# Patient Record
Sex: Female | Born: 1955
Health system: Southern US, Community
[De-identification: ages and names within clinical notes are randomized; demographics above are authoritative.]

## PROBLEM LIST (undated history)

## (undated) DIAGNOSIS — I10 Essential (primary) hypertension: Secondary | ICD-10-CM

## (undated) DIAGNOSIS — J45998 Other asthma: Secondary | ICD-10-CM

## (undated) DIAGNOSIS — K219 Gastro-esophageal reflux disease without esophagitis: Secondary | ICD-10-CM

## (undated) DIAGNOSIS — L659 Nonscarring hair loss, unspecified: Secondary | ICD-10-CM

## (undated) DIAGNOSIS — J449 Chronic obstructive pulmonary disease, unspecified: Secondary | ICD-10-CM

## (undated) DIAGNOSIS — Z923 Personal history of irradiation: Secondary | ICD-10-CM

## (undated) DIAGNOSIS — H9190 Unspecified hearing loss, unspecified ear: Secondary | ICD-10-CM

## (undated) DIAGNOSIS — E785 Hyperlipidemia, unspecified: Secondary | ICD-10-CM

## (undated) DIAGNOSIS — C50919 Malignant neoplasm of unspecified site of unspecified female breast: Secondary | ICD-10-CM

## (undated) DIAGNOSIS — Z86010 Personal history of colonic polyps: Secondary | ICD-10-CM

## (undated) DIAGNOSIS — I447 Left bundle-branch block, unspecified: Secondary | ICD-10-CM

## (undated) DIAGNOSIS — Z9889 Other specified postprocedural states: Secondary | ICD-10-CM

## (undated) HISTORY — DX: Other specified postprocedural states: Z98.890

## (undated) HISTORY — DX: Nonscarring hair loss, unspecified: L65.9

## (undated) HISTORY — PX: ABDOMINAL HYSTERECTOMY: SHX81

## (undated) HISTORY — DX: Hyperlipidemia, unspecified: E78.5

## (undated) HISTORY — DX: Personal history of colonic polyps: Z86.010

## (undated) HISTORY — DX: Gastro-esophageal reflux disease without esophagitis: K21.9

## (undated) HISTORY — DX: Left bundle-branch block, unspecified: I44.7

## (undated) HISTORY — PX: COLONOSCOPY: SHX174

## (undated) HISTORY — PX: WISDOM TOOTH EXTRACTION: SHX21

## (undated) HISTORY — PX: OTHER SURGICAL HISTORY: SHX169

## (undated) HISTORY — PX: AUGMENTATION MAMMAPLASTY: SUR837

## (undated) HISTORY — DX: Essential (primary) hypertension: I10

## (undated) HISTORY — PX: UPPER GASTROINTESTINAL ENDOSCOPY: SHX188

## (undated) HISTORY — DX: Unspecified hearing loss, unspecified ear: H91.90

## (undated) HISTORY — PX: ANTERIOR FUSION CERVICAL SPINE: SUR626

## (undated) HISTORY — PX: POLYPECTOMY: SHX149

## (undated) HISTORY — DX: Chronic obstructive pulmonary disease, unspecified: J44.9

## (undated) HISTORY — DX: Other asthma: J45.998

---

## 1993-02-25 DIAGNOSIS — Z860100 Personal history of colon polyps, unspecified: Secondary | ICD-10-CM

## 1993-02-25 HISTORY — DX: Personal history of colon polyps, unspecified: Z86.0100

## 1993-02-25 HISTORY — DX: Personal history of colonic polyps: Z86.010

## 1997-12-22 ENCOUNTER — Ambulatory Visit (HOSPITAL_COMMUNITY): Admission: RE | Admit: 1997-12-22 | Discharge: 1997-12-22 | Payer: Self-pay | Admitting: Specialist

## 2000-09-10 ENCOUNTER — Other Ambulatory Visit: Admission: RE | Admit: 2000-09-10 | Discharge: 2000-09-10 | Payer: Self-pay | Admitting: Obstetrics and Gynecology

## 2000-09-15 ENCOUNTER — Ambulatory Visit (HOSPITAL_COMMUNITY): Admission: RE | Admit: 2000-09-15 | Discharge: 2000-09-15 | Payer: Self-pay | Admitting: Obstetrics and Gynecology

## 2000-09-15 ENCOUNTER — Encounter: Payer: Self-pay | Admitting: Obstetrics and Gynecology

## 2000-11-18 ENCOUNTER — Encounter: Payer: Self-pay | Admitting: Obstetrics and Gynecology

## 2000-11-18 ENCOUNTER — Inpatient Hospital Stay (HOSPITAL_COMMUNITY): Admission: RE | Admit: 2000-11-18 | Discharge: 2000-11-20 | Payer: Self-pay | Admitting: Obstetrics and Gynecology

## 2000-11-18 ENCOUNTER — Encounter (INDEPENDENT_AMBULATORY_CARE_PROVIDER_SITE_OTHER): Payer: Self-pay | Admitting: *Deleted

## 2001-09-23 ENCOUNTER — Emergency Department (HOSPITAL_COMMUNITY): Admission: EM | Admit: 2001-09-23 | Discharge: 2001-09-23 | Payer: Self-pay | Admitting: Emergency Medicine

## 2001-09-23 ENCOUNTER — Encounter: Payer: Self-pay | Admitting: Emergency Medicine

## 2001-10-17 ENCOUNTER — Encounter: Payer: Self-pay | Admitting: Neurology

## 2001-10-17 ENCOUNTER — Ambulatory Visit (HOSPITAL_COMMUNITY): Admission: RE | Admit: 2001-10-17 | Discharge: 2001-10-17 | Payer: Self-pay | Admitting: Neurology

## 2002-09-15 ENCOUNTER — Encounter: Payer: Self-pay | Admitting: Obstetrics and Gynecology

## 2002-09-15 ENCOUNTER — Encounter: Admission: RE | Admit: 2002-09-15 | Discharge: 2002-09-15 | Payer: Self-pay | Admitting: Obstetrics and Gynecology

## 2003-12-13 ENCOUNTER — Ambulatory Visit (HOSPITAL_COMMUNITY): Admission: RE | Admit: 2003-12-13 | Discharge: 2003-12-13 | Payer: Self-pay | Admitting: Ophthalmology

## 2003-12-27 ENCOUNTER — Ambulatory Visit (HOSPITAL_COMMUNITY): Admission: RE | Admit: 2003-12-27 | Discharge: 2003-12-27 | Payer: Self-pay | Admitting: Neurology

## 2006-02-12 ENCOUNTER — Encounter: Admission: RE | Admit: 2006-02-12 | Discharge: 2006-02-12 | Payer: Self-pay | Admitting: Obstetrics and Gynecology

## 2006-05-27 ENCOUNTER — Ambulatory Visit: Payer: Self-pay | Admitting: Internal Medicine

## 2006-05-27 LAB — CONVERTED CEMR LAB
ALT: 33 units/L (ref 0–40)
AST: 30 units/L (ref 0–37)
Albumin: 3.5 g/dL (ref 3.5–5.2)
Alkaline Phosphatase: 121 units/L — ABNORMAL HIGH (ref 39–117)
BUN: 9 mg/dL (ref 6–23)
Basophils Relative: 0.5 % (ref 0.0–1.0)
CO2: 32 meq/L (ref 19–32)
Calcium: 9.4 mg/dL (ref 8.4–10.5)
Cholesterol: 168 mg/dL (ref 0–200)
Creatinine, Ser: 0.7 mg/dL (ref 0.4–1.2)
Eosinophils Relative: 2 % (ref 0.0–5.0)
GFR calc Af Amer: 114 mL/min
GFR calc non Af Amer: 95 mL/min
Glucose, Bld: 94 mg/dL (ref 70–99)
HCT: 43.8 % (ref 36.0–46.0)
HDL: 38.6 mg/dL — ABNORMAL LOW (ref >39.0)
Hemoglobin, Urine: NEGATIVE
Hemoglobin: 15 g/dL (ref 12.0–15.0)
Leukocytes, UA: NEGATIVE
MCHC: 34.2 g/dL (ref 30.0–36.0)
MCV: 88.7 fL (ref 78.0–100.0)
Neutro Abs: 4.5 10*3/uL (ref 1.4–7.7)
Neutrophils Relative %: 62.3 % (ref 43.0–77.0)
Potassium: 4.3 meq/L (ref 3.5–5.1)
RBC: 4.94 M/uL (ref 3.87–5.11)
RDW: 12.3 % (ref 11.5–14.6)
TSH: 1 microintl units/mL (ref 0.35–5.50)
Total Bilirubin: 1.1 mg/dL (ref 0.3–1.2)
Urobilinogen, UA: 0.2 (ref 0.0–1.0)
WBC: 7.2 10*3/uL (ref 4.5–10.5)

## 2007-03-12 ENCOUNTER — Encounter: Admission: RE | Admit: 2007-03-12 | Discharge: 2007-03-12 | Payer: Self-pay | Admitting: Obstetrics and Gynecology

## 2007-09-29 ENCOUNTER — Ambulatory Visit: Payer: Self-pay | Admitting: Gastroenterology

## 2007-10-13 ENCOUNTER — Ambulatory Visit: Payer: Self-pay | Admitting: Gastroenterology

## 2007-10-13 ENCOUNTER — Encounter: Payer: Self-pay | Admitting: Gastroenterology

## 2007-10-13 LAB — HM COLONOSCOPY

## 2007-10-14 ENCOUNTER — Encounter: Payer: Self-pay | Admitting: Gastroenterology

## 2007-11-04 ENCOUNTER — Ambulatory Visit: Payer: Self-pay | Admitting: Internal Medicine

## 2007-11-04 DIAGNOSIS — Z860100 Personal history of colon polyps, unspecified: Secondary | ICD-10-CM | POA: Insufficient documentation

## 2007-11-04 DIAGNOSIS — R21 Rash and other nonspecific skin eruption: Secondary | ICD-10-CM | POA: Insufficient documentation

## 2007-11-04 DIAGNOSIS — K219 Gastro-esophageal reflux disease without esophagitis: Secondary | ICD-10-CM

## 2007-11-04 DIAGNOSIS — L659 Nonscarring hair loss, unspecified: Secondary | ICD-10-CM

## 2007-11-04 DIAGNOSIS — E785 Hyperlipidemia, unspecified: Secondary | ICD-10-CM | POA: Insufficient documentation

## 2007-11-04 HISTORY — DX: Nonscarring hair loss, unspecified: L65.9

## 2007-11-04 HISTORY — DX: Personal history of colon polyps, unspecified: Z86.0100

## 2007-11-04 HISTORY — DX: Hyperlipidemia, unspecified: E78.5

## 2007-11-04 HISTORY — DX: Gastro-esophageal reflux disease without esophagitis: K21.9

## 2007-11-04 HISTORY — DX: Personal history of colonic polyps: Z86.010

## 2007-11-05 LAB — CONVERTED CEMR LAB
ALT: 21 units/L (ref 0–35)
Alkaline Phosphatase: 98 units/L (ref 39–117)
BUN: 9 mg/dL (ref 6–23)
Basophils Absolute: 0.1 10*3/uL (ref 0.0–0.1)
Basophils Relative: 1 % (ref 0.0–3.0)
Calcium: 9.7 mg/dL (ref 8.4–10.5)
Chloride: 106 meq/L (ref 96–112)
Cholesterol: 147 mg/dL (ref 0–200)
Creatinine, Ser: 0.8 mg/dL (ref 0.4–1.2)
Eosinophils Absolute: 0.2 10*3/uL (ref 0.0–0.7)
Eosinophils Relative: 2.1 % (ref 0.0–5.0)
GFR calc non Af Amer: 81 mL/min
HCT: 44.3 % (ref 36.0–46.0)
Hemoglobin: 15.3 g/dL — ABNORMAL HIGH (ref 12.0–15.0)
Ketones, ur: NEGATIVE mg/dL
LDL Cholesterol: 94 mg/dL (ref 0–99)
MCHC: 34.5 g/dL (ref 30.0–36.0)
MCV: 90.2 fL (ref 78.0–100.0)
Monocytes Absolute: 0.8 10*3/uL (ref 0.1–1.0)
Neutro Abs: 5.3 10*3/uL (ref 1.4–7.7)
Neutrophils Relative %: 62.2 % (ref 43.0–77.0)
RBC: 4.92 M/uL (ref 3.87–5.11)
Specific Gravity, Urine: 1.01 (ref 1.000–1.03)
Total Bilirubin: 1.3 mg/dL — ABNORMAL HIGH (ref 0.3–1.2)
Total Protein, Urine: NEGATIVE mg/dL
Total Protein: 7.1 g/dL (ref 6.0–8.3)
Triglycerides: 94 mg/dL (ref 0–149)
Urine Glucose: NEGATIVE mg/dL
Urobilinogen, UA: 0.2 (ref 0.0–1.0)
WBC: 8.6 10*3/uL (ref 4.5–10.5)
pH: 6 (ref 5.0–8.0)

## 2007-11-27 ENCOUNTER — Ambulatory Visit: Payer: Self-pay | Admitting: Internal Medicine

## 2007-11-27 DIAGNOSIS — L738 Other specified follicular disorders: Secondary | ICD-10-CM | POA: Insufficient documentation

## 2009-02-02 ENCOUNTER — Encounter: Admission: RE | Admit: 2009-02-02 | Discharge: 2009-02-02 | Payer: Self-pay | Admitting: Obstetrics and Gynecology

## 2009-05-09 ENCOUNTER — Ambulatory Visit: Payer: Self-pay | Admitting: Internal Medicine

## 2009-05-09 DIAGNOSIS — J069 Acute upper respiratory infection, unspecified: Secondary | ICD-10-CM | POA: Insufficient documentation

## 2009-05-09 DIAGNOSIS — R079 Chest pain, unspecified: Secondary | ICD-10-CM | POA: Insufficient documentation

## 2009-06-06 ENCOUNTER — Ambulatory Visit: Payer: Self-pay | Admitting: Internal Medicine

## 2009-06-06 LAB — CONVERTED CEMR LAB
AST: 31 units/L (ref 0–37)
Albumin: 3.8 g/dL (ref 3.5–5.2)
BUN: 9 mg/dL (ref 6–23)
Basophils Absolute: 0 10*3/uL (ref 0.0–0.1)
Basophils Relative: 0.4 % (ref 0.0–3.0)
CO2: 32 meq/L (ref 19–32)
Calcium: 9.5 mg/dL (ref 8.4–10.5)
Eosinophils Absolute: 0.1 10*3/uL (ref 0.0–0.7)
Eosinophils Relative: 1.7 % (ref 0.0–5.0)
GFR calc non Af Amer: 79.98 mL/min (ref >60)
Glucose, Bld: 95 mg/dL (ref 70–99)
HCT: 41.9 % (ref 36.0–46.0)
HDL: 40.9 mg/dL (ref >39.00)
Ketones, ur: NEGATIVE mg/dL
Leukocytes, UA: NEGATIVE
Lymphs Abs: 2.1 10*3/uL (ref 0.7–4.0)
MCV: 89.1 fL (ref 78.0–100.0)
Monocytes Absolute: 0.6 10*3/uL (ref 0.1–1.0)
Monocytes Relative: 8.9 % (ref 3.0–12.0)
Neutrophils Relative %: 58 % (ref 43.0–77.0)
Nitrite: NEGATIVE
Platelets: 227 10*3/uL (ref 150.0–400.0)
RBC: 4.7 M/uL (ref 3.87–5.11)
Specific Gravity, Urine: >=1.03 (ref 1.000–1.030)
TSH: 1.03 microintl units/mL (ref 0.35–5.50)
Total CHOL/HDL Ratio: 4
Urobilinogen, UA: 2 (ref 0.0–1.0)
VLDL: 20.4 mg/dL (ref 0.0–40.0)
WBC: 6.8 10*3/uL (ref 4.5–10.5)

## 2009-06-07 ENCOUNTER — Ambulatory Visit: Payer: Self-pay | Admitting: Internal Medicine

## 2009-06-07 DIAGNOSIS — I10 Essential (primary) hypertension: Secondary | ICD-10-CM | POA: Insufficient documentation

## 2009-06-07 HISTORY — DX: Essential (primary) hypertension: I10

## 2009-06-26 ENCOUNTER — Telehealth: Payer: Self-pay | Admitting: Internal Medicine

## 2009-06-26 DIAGNOSIS — H9209 Otalgia, unspecified ear: Secondary | ICD-10-CM | POA: Insufficient documentation

## 2009-07-11 ENCOUNTER — Encounter: Admission: RE | Admit: 2009-07-11 | Discharge: 2009-07-11 | Payer: Self-pay | Admitting: Otolaryngology

## 2009-12-08 ENCOUNTER — Ambulatory Visit: Payer: Self-pay | Admitting: Internal Medicine

## 2010-02-05 ENCOUNTER — Encounter
Admission: RE | Admit: 2010-02-05 | Discharge: 2010-02-05 | Payer: Self-pay | Source: Home / Self Care | Attending: Obstetrics and Gynecology | Admitting: Obstetrics and Gynecology

## 2010-03-27 NOTE — Assessment & Plan Note (Signed)
Summary: 1 month f/u // #? cd   Vital Signs:  Patient profile:   55 year old female Height:      65 inches Weight:      196 pounds BMI:     32.73 O2 Sat:      95 % on Room air Temp:     97.5 degrees F oral Pulse rate:   79 / minute BP sitting:   148 / 100  (left arm) Cuff size:   regular  Vitals Entered ByZella Ball Ewing (June 07, 2009 8:09 AM)  O2 Flow:  Room air  Preventive Care Screening  Colonoscopy:    Next Due:  10/2012  Pap Smear:    Date:  02/14/2009    Results:  normal   Mammogram:    Date:  12/26/2008    Results:  normal   CC: 1 month followup/Re   CC:  1 month followup/Re.  History of Present Illness: here for f/u;  she notes BP has been persistently elevated despite near resolution of URI symptoms;  unfortunately also with mild left ear fullness without fever, decreased hearing, vertigo, ST, cough;  also has improved size and tenderness o lump to mid left neck. but she is still concerned.  No other LA or tender lumps, wt loss, night sweats or other consitutional symtpoms.  Pt denies CP, sob, doe, wheezing, orthopnea, pnd, worsening LE edema, palps, dizziness or syncope   Pt denies new neuro symptoms such as headache, facial or extremity weakness     Preventive Screening-Counseling & Management      Drug Use:  no.    Problems Prior to Update: 1)  Hypertension  (ICD-401.9) 2)  Elevated Blood Pressure Without Diagnosis of Hypertension  (ICD-796.2) 3)  Chest Pain  (ICD-786.50) 4)  Uri  (ICD-465.9) 5)  Folliculitis  (ICD-704.8) 6)  Hyperlipidemia  (ICD-272.4) 7)  Gerd  (ICD-530.81) 8)  Colonic Polyps, Hx of  (ICD-V12.72) 9)  Rash-nonvesicular  (ICD-782.1) 10)  Hair Loss  (ICD-704.00) 11)  Preventive Health Care  (ICD-V70.0)  Medications Prior to Update: 1)  Cephalexin 500 Mg Caps (Cephalexin) .Marland Kitchen.. 1po Three Times A Day  Current Medications (verified): 1)  Losartan Potassium 50 Mg Tabs (Losartan Potassium) .Marland Kitchen.. 1po Once Daily 2)  Aspir-Low 81 Mg Tbec  (Aspirin) .Marland Kitchen.. 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 grandmother with breast cancer father with lung cancer  Social History: Reviewed history from 11/04/2007 and no changes required. 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         all otherwise negative per pt -    Physical Exam  General:  alert and  overweight-appearing.   Head:  normocephalic and atraumatic.   Eyes:  vision grossly intact, pupils equal, and pupils round.   Ears:  R ear normal.  , left TM with mild erythema, no bulging or canal d/c  Nose:  no external deformity and no nasal discharge.   Mouth:  no gingival abnormalities and pharynx pink and moist.   Neck:  supple.  , has one approx .5 cm or less mild tender LN just anterior to the mid SCM Lungs:  normal respiratory effort and normal breath sounds.   Heart:  normal  rate and regular rhythm.   Abdomen:  soft, non-tender, and normal bowel sounds.   Msk:  no joint tenderness and no joint swelling.   Extremities:  no edema, no erythema  Neurologic:  cranial nerves II-XII intact and strength normal in all extremities.   Skin:  color normal and no rashes.   Psych:  slightly anxious.     Impression & Recommendations:  Problem # 1:  Preventive Health Care (ICD-V70.0) Overall doing well, age appropriate education and counseling updated and referral for appropriate preventive services done unless declined, immunizations up to date or declined, diet counseling done if overweight, urged to quit smoking if smokes , most recent labs reviewed and current ordered if appropriate, ecg reviewed or declined (interpretation per ECG scanned in the EMR if done); information regarding Medicare Prevention requirements given if appropriate   Problem # 2:  HYPERTENSION (ICD-401.9)  new onset - for losartan 50 mg per day, f/u BP at home, as well as next visit  Her updated medication list for this problem includes:    Losartan Potassium 50 Mg Tabs (Losartan potassium) .Marland Kitchen... 1po once daily  BP today: 148/100 Prior BP: 148/102 (05/09/2009)  Labs Reviewed: K+: 3.9 (06/06/2009) Creat: : 0.8 (06/06/2009)   Chol: 172 (06/06/2009)   HDL: 40.90 (06/06/2009)   LDL: 111 (06/06/2009)   TG: 102.0 (06/06/2009)  Problem # 3:  URI (ICD-465.9)  Her updated medication list for this problem includes:    Aspir-low 81 Mg Tbec (Aspirin) .Marland Kitchen... 1po once daily overall improved, with mild left TM erythema and tender left neck  LA improved as well;  ok to follow further, to ENT if does not improve within 1 -2 wks  Complete Medication List: 1)  Losartan Potassium 50 Mg Tabs (Losartan potassium) .Marland Kitchen.. 1po once daily 2)  Aspir-low 81 Mg Tbec (Aspirin) .Marland Kitchen.. 1po once daily  Other Orders: Tdap => 38yrs IM (54098) Admin 1st Vaccine (11914)  Patient Instructions: 1)  you had the tetanus shot  today 2)  Please take all new medications as prescribed - the losartan 3)  Please check your Blood Pressure at home on a regular basis - call in 3 to 4 wks if it seems the top number is still > 140, or the lower number is still > 90 on average 4)  Take an Aspirin every day - 81 mg -1  per day - COATED only (the brand makes not difference) to reduce risk of stroke and heart disease 5)  please follow lower cholesterol diet 6)  You can also use Mucinex OTC or it's generic for congestion in the left inner ear 7)  Please call in 2 wks for ENT referral if the left ear and neck lump are not improved 8)  Please schedule a follow-up appointment in 6 months, or sooner if needed Prescriptions: LOSARTAN POTASSIUM 50 MG TABS (LOSARTAN POTASSIUM) 1po once daily  #30 x 11   Entered  and Authorized by:   Corwin Levins MD   Signed by:   Corwin Levins MD on 06/07/2009   Method used:   Print then Give to Patient   RxID:   [id number]    Immunizations Administered:  Tetanus Vaccine:    Vaccine Type: Tdap    Site: right deltoid    Mfr: GlaxoSmithKline    Dose: 0.5 ml    Route: IM    Given by: Robin Ewing    Exp. Date: 05/20/2011    Lot #: [id number]    VIS given: 01/13/07 version given June 07, 2009.

## 2010-03-27 NOTE — Assessment & Plan Note (Signed)
Summary: knot in throat/headache/chest hurts/lnauseated/lb   Vital Signs:  Patient profile:   55 year old female Height:      65 inches Weight:      198 pounds BMI:     33.07 O2 Sat:      97 % on Room air Temp:     97.5 degrees F oral Pulse rate:   82 / minute BP sitting:   148 / 102  (left arm) Cuff size:   regular  Vitals Entered ByZella Ball Ewing (May 09, 2009 11:27 AM)  O2 Flow:  Room air CC: chest pain, nauseated, know in throat/RE   CC:  chest pain, nauseated, and know in throat/RE.  History of Present Illness: here with acute onset midl to mod x 3 days URI symptoms with headache, sinus congesiton and severe ST, with fever, malaase and fatigue;  also with upper mid ant dull CP nonexertional but nonpleuritic , nonradiating and not assoc with diaphoresis, vomit, palp or fatigue although there is significant nausea and decreased appeitite.  Pt denies other CP, sob, doe, wheezing, orthopnea, pnd, worsening LE edema, palps, dizziness or syncope Pt denies new neuro symptoms such as headache, facial or extremity weakness No prior hx of elev BP per pt.   Problems Prior to Update: 1)  Chest Pain  (ICD-786.50) 2)  Uri  (ICD-465.9) 3)  Folliculitis  (ICD-704.8) 4)  Hyperlipidemia  (ICD-272.4) 5)  Gerd  (ICD-530.81) 6)  Colonic Polyps, Hx of  (ICD-V12.72) 7)  Rash-nonvesicular  (ICD-782.1) 8)  Hair Loss  (ICD-704.00) 9)  Preventive Health Care  (ICD-V70.0)  Medications Prior to Update: 1)  Doxycycline Hyclate 100 Mg Tabs (Doxycycline Hyclate) .Marland Kitchen.. 1 By Mouth Two Times A Day  Current Medications (verified): 1)  Cephalexin 500 Mg Caps (Cephalexin) .Marland Kitchen.. 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 , mild ill   Head:  normocephalic and atraumatic.   Eyes:  vision grossly intact, pupils equal, and pupils round.   Ears:  bilat tm's red, sinus tender blat Nose:  nasal dischargemucosal pallor and mucosal edema.   Mouth:  pharyngeal erythema and fair dentition.   Neck:  supple and cervical lymphadenopathy.   Lungs:  normal respiratory effort and normal breath sounds.   Heart:  normal rate and regular rhythm.   Abdomen:  soft, non-tender, and normal bowel sounds.   Extremities:  no edema, no erythema    Impression & Recommendations:  Problem # 1:  URI (ICD-465.9) treat as above, f/u any worsening signs or symptoms - cephalexin course  Problem # 2:  CHEST PAIN (ICD-786.50) ? MSK from cough, cant r/o other such as pna  - to check cxr  Orders: T-2 View CXR, Same Day (71020.5TC)  Problem # 3:  ELEVATED BLOOD PRESSURE WITHOUT DIAGNOSIS OF HYPERTENSION (ICD-796.2)  mild elev today, likely situational, ok to follow, continue same treatment   BP today: 148/102 Prior BP: 122/86 (11/27/2007)  Labs Reviewed: Creat: 0.8 (11/04/2007) Chol: 147 (11/04/2007)   HDL: 34.3 (11/04/2007)  LDL: 94 (11/04/2007)   TG: 94 (11/04/2007)  Instructed in low sodium diet (DASH Handout) and behavior modification.    Complete Medication List: 1)  Cephalexin 500 Mg Caps (Cephalexin) .Marland Kitchen.. 1po three times a day  Patient Instructions: 1)  Please take all new medications as prescribed 2)  Continue all previous medications as before this visit  3)  Please go to Radiology in the basement level for your X-Ray today  4)  Please schedule a follow-up appointment in 1 month with CPX labs Prescriptions: CEPHALEXIN 500 MG CAPS (CEPHALEXIN) 1po three  times a day  #30 x 0   Entered and Authorized by:   Corwin Levins MD   Signed by:   Corwin Levins MD on 05/09/2009   Method used:   Print then Give to Patient   RxID:   [id number]

## 2010-03-27 NOTE — Progress Notes (Signed)
Summary: Referral  Phone Note Call from Patient Call back at Work Phone [phone]   Caller: Patient Summary of Call: pt called stating that ear pain and "lump in throat" are not better. pt is requesting referral to ENT Dr Laveda Abbe on [facility] Initial call taken by: Margaret Pyle, CMA,  Jun 26, 2009 10:30 AM  Follow-up for Phone Call        ok  for referral - done per emr Follow-up by: Corwin Levins MD,  Jun 26, 2009 1:04 PM  Additional Follow-up for Phone Call Additional follow up Details #1::        pt informed via secure VM Additional Follow-up by: Margaret Pyle, CMA,  Jun 26, 2009 1:12 PM  New Problems: EAR PAIN (ICD-388.70)   New Problems: EAR PAIN (ICD-388.70)

## 2010-03-27 NOTE — Assessment & Plan Note (Signed)
Summary: 6 MO ROV /NWS #   Vital Signs:  Patient profile:   55 year old female Height:      65 inches Weight:      189.50 pounds BMI:     31.65 O2 Sat:      96 % on Room air Temp:     97.6 degrees F oral Pulse rate:   84 / minute BP sitting:   140 / 84  (left arm) Cuff size:   regular  Vitals Entered By: Zella Ball Ewing CMA (AAMA) (December 08, 2009 8:25 AM)  O2 Flow:  Room air CC: 6 month ROV/RE   CC:  6 month ROV/RE.  History of Present Illness: here to f/u; overall doing well;  has not been taking the losartan and to monitor as well as follow lifestyle changes with low salt, excericse, wt control;  Pt denies CP, worsening sob, doe, wheezing, orthopnea, pnd, worsening LE edema, palps, dizziness or syncope  Pt denies new neuro symptoms such as headache, facial or extremity weakness  No fever, wt loss, night sweats, loss of appetite or other constitutional symptoms  Pt denies polydipsia, polyuria  trying to follow low chol diet, wt stable, little excercise however .    Problems Prior to Update: 1)  Ear Pain  (ICD-388.70) 2)  Hypertension  (ICD-401.9) 3)  Chest Pain  (ICD-786.50) 4)  Uri  (ICD-465.9) 5)  Folliculitis  (ICD-704.8) 6)  Hyperlipidemia  (ICD-272.4) 7)  Gerd  (ICD-530.81) 8)  Colonic Polyps, Hx of  (ICD-V12.72) 9)  Rash-nonvesicular  (ICD-782.1) 10)  Hair Loss  (ICD-704.00) 11)  Preventive Health Care  (ICD-V70.0)  Medications Prior to Update: 1)  Losartan Potassium 50 Mg Tabs (Losartan Potassium) .Marland Kitchen.. 1po Once Daily 2)  Aspir-Low 81 Mg Tbec (Aspirin) .Marland Kitchen.. 1po Once Daily  Current Medications (verified): 1)  Aspir-Low 81 Mg Tbec (Aspirin) .Marland Kitchen.. 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   Head:  normocephalic and atraumatic.   Eyes:  vision grossly intact, pupils equal, and pupils round.   Ears:  R ear normal and L ear normal.   Nose:  no external deformity and no nasal discharge.   Mouth:  no gingival abnormalities and pharynx pink and moist.   Neck:  supple and no masses.   Lungs:  normal respiratory effort and normal breath sounds.   Heart:  normal rate and regular rhythm.   Extremities:  no edema, no erythema    Impression & Recommendations:  Problem # 1:  HYPERTENSION (ICD-401.9)  The following medications were removed from the medication list:    Losartan Potassium 50 Mg Tabs (Losartan potassium) .Marland Kitchen... 1po once daily  BP today: 140/84 Prior BP: 148/100 (06/07/2009)  Labs Reviewed: K+: 3.9 (06/06/2009) Creat: : 0.8 (06/06/2009)   Chol: 172 (06/06/2009)   HDL: 40.90 (06/06/2009)   LDL: 111 (06/06/2009)   TG: 102.0 (06/06/2009) stable overall by hx and exam, ok to continue meds/tx as is   Problem # 2:  HYPERLIPIDEMIA (ICD-272.4)  Labs Reviewed: SGOT: 31 (06/06/2009)   SGPT: 27 (06/06/2009)   HDL:40.90 (06/06/2009), 34.3 (11/04/2007)  LDL:111 (06/06/2009), 94 (42/59/5638)  Chol:172 (06/06/2009), 147 (11/04/2007)  Trig:102.0 (06/06/2009), 94 (11/04/2007) d/w pt - declines statin, to follow lower chol diet, Pt to continue diet efforts,  - goal LDL less than 100   Complete Medication List: 1)  Aspir-low 81 Mg Tbec (Aspirin) .Marland Kitchen.. 1po once daily  Other Orders: Admin 1st Vaccine (75643) Flu Vaccine 53yrs + [phone])  Patient Instructions: 1)  Ok to stay off the losartan for now 2)  Continue all previous medications as before this visit  - the Aspirin 3)  Please continue to  follow the lower chol diet 4)  Please monitor your BP at home at least weekly and call for persistent BP > 140 (the top number) or 90 on the lower number) 5)  Please bring your home machine with you to your next visit  6)  You had the flu shot today 7)  Please schedule a follow-up appointment in 6 months with CPX labs  Flu Vaccine Consent Questions     Do you have a history of severe allergic reactions to this vaccine? no    Any prior history of allergic reactions to egg and/or gelatin? no    Do you have a sensitivity to the preservative Thimersol? no    Do you have a past history of Guillan-Barre Syndrome? no    Do you currently have an acute febrile illness? no    Have you ever had a severe reaction to latex? no    Vaccine information given and explained to patient? yes    Are you currently pregnant? no    Lot Number:[id number]   Exp Date:08/25/2010   Site Given  Left Deltoid [email or web address]

## 2010-05-18 ENCOUNTER — Encounter: Payer: Self-pay | Admitting: Internal Medicine

## 2010-05-18 ENCOUNTER — Ambulatory Visit (INDEPENDENT_AMBULATORY_CARE_PROVIDER_SITE_OTHER): Payer: BC Managed Care – PPO | Admitting: Internal Medicine

## 2010-05-18 VITALS — BP 130/90 | HR 81 | Temp 98.0°F | Ht 65.0 in | Wt 185.4 lb

## 2010-05-18 DIAGNOSIS — I10 Essential (primary) hypertension: Secondary | ICD-10-CM

## 2010-05-18 DIAGNOSIS — Z0000 Encounter for general adult medical examination without abnormal findings: Secondary | ICD-10-CM

## 2010-05-18 DIAGNOSIS — Z0001 Encounter for general adult medical examination with abnormal findings: Secondary | ICD-10-CM | POA: Insufficient documentation

## 2010-05-18 DIAGNOSIS — Z7189 Other specified counseling: Secondary | ICD-10-CM | POA: Insufficient documentation

## 2010-05-18 MED ORDER — LOSARTAN POTASSIUM 50 MG PO TABS: 50.0000 mg | ORAL_TABLET | Freq: Every day | ORAL | Status: DC

## 2010-05-18 NOTE — Assessment & Plan Note (Addendum)
Uncontrolled - to re-start the losartan 50mg ,  F/u 4 wks with labs/cpx - o/w stable overall by hx and exam  Lab Results  Component Value Date   WBC 6.8 06/06/2009   HGB 14.6 06/06/2009   HGB TRACE-INTACT 06/06/2009   HCT 41.9 06/06/2009   PLT 227.0 06/06/2009   CHOL 172 06/06/2009   TRIG 102.0 06/06/2009   HDL 40.90 06/06/2009   ALT 27 06/06/2009   AST 31 06/06/2009   NA 149* 06/06/2009   K 3.9 06/06/2009   CL 109 06/06/2009   CREATININE 0.8 06/06/2009   BUN 9 06/06/2009   CO2 32 06/06/2009   TSH 1.03 06/06/2009

## 2010-05-18 NOTE — Patient Instructions (Signed)
Take all new medications as prescribed Continue all other medications as before  Please followup in 1 mo with blood work prior (about 3-5 days)

## 2010-05-18 NOTE — Progress Notes (Signed)
Here to /u, overall doing ok, but mult recent BP at home and [facility] with  BP at home and [employer] has been about 145 /low 95's;  Pt denies chest pain, increased sob or doe, wheezing, orthopnea, PND, increased LE swelling, palpitations, dizziness or syncope.    Pt denies new neurological symptoms such as new headache, or facial or extremity weakness or numbness.   Pt denies polydipsia, polyuria    Pt states overall good compliance with meds, trying to follow lower cholesterol, diabetic diet, wt overall stable but little exercise however.     Pt denies fever, wt loss, night sweats, loss of appetite, or other constitutional symptoms  Overall good compliance with treatment, and good medicine tolerability.   Past Medical History  Diagnosis Date  . Hx of colonic polyps 1995    adenomatous  . GERD (gastroesophageal reflux disease)   . Hyperlipidemia   . Hypertension   . COLONIC POLYPS, HX OF 11/04/2007  . GERD 11/04/2007  . HYPERLIPIDEMIA 11/04/2007  . HYPERTENSION 06/07/2009   Past Surgical History  Procedure Date  . Bilat ear surgury/hearing loss   . Abdominal hysterectomy     reports that she has quit smoking. She does not have any smokeless tobacco history on file. She reports that she does not drink alcohol or use illicit drugs. family history includes Breast cancer in her maternal grandmother and other; Cancer in her father; and Lung cancer in her father. Allergies  Allergen Reactions  . Codeine     REACTION: nausea,   Review of Systems  Constitutional: Negative for diaphoresis and unexpected weight change.  HENT: Negative for drooling and tinnitus.   Eyes: Negative for photophobia and visual disturbance.  Respiratory: Negative for choking and stridor.   Gastrointestinal: Negative for vomiting and blood in stool.  Genitourinary: Negative for hematuria and decreased urine volume.  Musculoskeletal: Negative for gait problem.  Skin: Negative for color change and wound.  Neurological: Negative  for tremors and numbness.  Psychiatric/Behavioral: Negative for decreased concentration. The patient is not hyperactive.    Physical Exam  Constitutional: Pt appears well-developed and well-nourished.  HENT: Head: Normocephalic.  Right Ear: External ear normal.  Left Ear: External ear normal.  Eyes: Conjunctivae and EOM are normal. Pupils are equal, round, and reactive to light.  Neck: Normal range of motion. Neck supple.  Cardiovascular: Normal rate and regular rhythm.   Pulmonary/Chest: Effort normal and breath sounds normal.  Abd:  Soft, NT, non-distended, + BS Neurological: Pt is alert. No cranial nerve deficit.  Skin: Skin is warm. No erythema.  Psychiatric: Pt behavior is normal. Thought content normal.

## 2010-06-04 ENCOUNTER — Other Ambulatory Visit: Payer: Self-pay | Admitting: Internal Medicine

## 2010-06-04 DIAGNOSIS — Z0000 Encounter for general adult medical examination without abnormal findings: Secondary | ICD-10-CM

## 2010-06-05 ENCOUNTER — Other Ambulatory Visit: Payer: Self-pay

## 2010-06-08 ENCOUNTER — Encounter: Payer: Self-pay | Admitting: Internal Medicine

## 2010-06-11 ENCOUNTER — Other Ambulatory Visit (INDEPENDENT_AMBULATORY_CARE_PROVIDER_SITE_OTHER): Payer: BC Managed Care – PPO

## 2010-06-11 DIAGNOSIS — Z0000 Encounter for general adult medical examination without abnormal findings: Secondary | ICD-10-CM

## 2010-06-11 LAB — HEPATIC FUNCTION PANEL
ALT: 18 U/L (ref 0–35)
AST: 27 U/L (ref 0–37)
Bilirubin, Direct: 0.2 mg/dL (ref 0.0–0.3)
Total Bilirubin: 1.5 mg/dL — ABNORMAL HIGH (ref 0.3–1.2)

## 2010-06-11 LAB — BASIC METABOLIC PANEL
BUN: 13 mg/dL (ref 6–23)
CO2: 28 mEq/L (ref 19–32)
Chloride: 106 mEq/L (ref 96–112)
Creatinine, Ser: 0.8 mg/dL (ref 0.4–1.2)

## 2010-06-11 LAB — CBC WITH DIFFERENTIAL/PLATELET
Basophils Relative: 0.4 % (ref 0.0–3.0)
Eosinophils Relative: 2.1 % (ref 0.0–5.0)
HCT: 40.7 % (ref 36.0–46.0)
Hemoglobin: 14 g/dL (ref 12.0–15.0)
MCHC: 34.4 g/dL (ref 30.0–36.0)
MCV: 90.8 fl (ref 78.0–100.0)
Neutro Abs: 4.6 10*3/uL (ref 1.4–7.7)
RBC: 4.49 Mil/uL (ref 3.87–5.11)
RDW: 13.2 % (ref 11.5–14.6)

## 2010-06-11 LAB — LIPID PANEL: Total CHOL/HDL Ratio: 4

## 2010-06-11 LAB — TSH: TSH: 0.88 u[IU]/mL (ref 0.35–5.50)

## 2010-06-11 LAB — URINALYSIS, ROUTINE W REFLEX MICROSCOPIC
Bilirubin Urine: NEGATIVE
Hgb urine dipstick: NEGATIVE
Ketones, ur: NEGATIVE
Nitrite: POSITIVE
Total Protein, Urine: NEGATIVE

## 2010-06-12 ENCOUNTER — Ambulatory Visit (INDEPENDENT_AMBULATORY_CARE_PROVIDER_SITE_OTHER): Payer: BC Managed Care – PPO | Admitting: Internal Medicine

## 2010-06-12 ENCOUNTER — Encounter: Payer: Self-pay | Admitting: Internal Medicine

## 2010-06-12 VITALS — BP 112/70 | HR 77 | Temp 98.1°F | Ht 65.0 in | Wt 187.5 lb

## 2010-06-12 DIAGNOSIS — Z0000 Encounter for general adult medical examination without abnormal findings: Secondary | ICD-10-CM

## 2010-06-12 DIAGNOSIS — I10 Essential (primary) hypertension: Secondary | ICD-10-CM

## 2010-06-12 NOTE — Assessment & Plan Note (Signed)
stable overall by hx and exam, most recent lab reviewed with pt, and pt to continue medical treatment as before  BP Readings from Last 3 Encounters:  06/12/10 112/70  05/18/10 130/90  12/08/09 140/84

## 2010-06-12 NOTE — Patient Instructions (Signed)
Continue all other medications as before Please return in 1 year for your yearly visit, or sooner if needed, with Lab testing done 3-5 days before  

## 2010-06-12 NOTE — Progress Notes (Signed)
Subjective:    Patient ID: Caitlin Wall, female    DOB: 13-Sep-1955, 55 y.o.   MRN: [id number]  HPI Here for wellness and f/u;  Overall doing ok;  Pt denies CP, worsening SOB, DOE, wheezing, orthopnea, PND, worsening LE edema, palpitations, dizziness or syncope.  Pt denies neurological change such as new Headache, facial or extremity weakness.  Pt denies polydipsia, polyuria, or low sugar symptoms. Pt states overall good compliance with treatment and medications, good tolerability, and trying to follow lower cholesterol diet.  Pt denies worsening depressive symptoms, suicidal ideation or panic. No fever, wt loss, night sweats, loss of appetite, or other constitutional symptoms.  Pt states good ability with ADL's, low fall risk, home safety reviewed and adequate, no significant changes in hearing or vision, and occasionally active with exercise. BP at [employer] usually similar to today.   Past Medical History  Diagnosis Date  . Hx of colonic polyps 1995    adenomatous  . GERD (gastroesophageal reflux disease)   . Hyperlipidemia   . Hypertension   . COLONIC POLYPS, HX OF 11/04/2007  . GERD 11/04/2007  . HYPERLIPIDEMIA 11/04/2007  . HYPERTENSION 06/07/2009  . HAIR LOSS 11/04/2007   Past Surgical History  Procedure Date  . Bilat ear surgury/hearing loss   . Abdominal hysterectomy     reports that she has quit smoking. She does not have any smokeless tobacco history on file. She reports that she does not drink alcohol or use illicit drugs. family history includes Breast cancer in her maternal grandmother and other; Cancer in her father; and Lung cancer in her father. Allergies  Allergen Reactions  . Codeine     REACTION: nausea,   Current Outpatient Prescriptions on File Prior to Visit  Medication Sig Dispense Refill  . aspirin 81 MG tablet Take 81 mg by mouth daily.        Marland Kitchen losartan (COZAAR) 50 MG tablet Take 1 tablet (50 mg total) by mouth daily.  30 tablet  11   Review of  Systems Review of Systems  Constitutional: Negative for diaphoresis, activity change, appetite change and unexpected weight change.  HENT: Negative for hearing loss, ear pain, facial swelling, mouth sores and neck stiffness.   Eyes: Negative for pain, redness and visual disturbance.  Respiratory: Negative for shortness of breath and wheezing.   Cardiovascular: Negative for chest pain and palpitations.  Gastrointestinal: Negative for diarrhea, blood in stool, abdominal distention and rectal pain.  Genitourinary: Negative for hematuria, flank pain and decreased urine volume.  Musculoskeletal: Negative for myalgias and joint swelling.  Skin: Negative for color change and wound.  Neurological: Negative for syncope and numbness.  Hematological: Negative for adenopathy.  Psychiatric/Behavioral: Negative for hallucinations, self-injury, decreased concentration and agitation.      Objective:   Physical Exam BP 112/70  Pulse 77  Temp(Src) 98.1 F (36.7 C) (Oral)  Ht 5\' 5"  (1.651 m)  Wt 187 lb 8 oz (85.049 kg)  BMI 31.20 kg/m2  SpO2 94% Physical Exam  VS noted Constitutional: Pt is oriented to person, place, and time. Appears well-developed and well-nourished.  HENT:  Head: Normocephalic and atraumatic.  Right Ear: External ear normal.  Left Ear: External ear normal.  Nose: Nose normal.  Mouth/Throat: Oropharynx is clear and moist.  Eyes: Conjunctivae and EOM are normal. Pupils are equal, round, and reactive to light.  Neck: Normal range of motion. Neck supple. No JVD present. No tracheal deviation present.  Cardiovascular: Normal rate, regular rhythm, normal heart sounds  and intact distal pulses.   Pulmonary/Chest: Effort normal and breath sounds normal.  Abdominal: Soft. Bowel sounds are normal. There is no tenderness.  Musculoskeletal: Normal range of motion. Exhibits no edema.  Lymphadenopathy:  Has no cervical adenopathy.  Neurological: Pt is alert and oriented to person, place,  and time. Pt has normal reflexes. No cranial nerve deficit.  Skin: Skin is warm and dry. No rash noted.  Psychiatric:  Has  normal mood and affect. Behavior is normal.         Assessment & Plan:

## 2010-07-13 NOTE — Op Note (Signed)
[facility]  Patient:    DESI, CARBY Visit Number: [id number] MRN: [id number]          Service Type: GYN Location: 1S X009 01 Attending Physician:  Malon Kindle Dictated by:   Malachi Pro. Ambrose Mantle, M.D. Proc. Date: 11/18/00 Admit Date:  11/18/2000                             Operative Report  PREOPERATIVE DIAGNOSIS:  Menorrhagia and dysmenorrhea unresponsive to medical therapy.  POSTOPERATIVE DIAGNOSIS:  Menorrhagia and dysmenorrhea unresponsive to medical therapy.  OPERATION:  Vaginal hysterectomy.  OPERATOR:  Malachi Pro. Ambrose Mantle, M.D.  ASSISTANT:  Alvino Chapel, M.D.  ANESTHESIA:  General anesthesia.  DESCRIPTION OF PROCEDURE:  The patient was brought to the operating room and placed under satisfactory general anesthesia and placed in the lithotomy position.  The vulva, vagina, perineum and urethra were prepped with Betadine solution, draped as a sterile field, a Foley catheter was inserted to straight drain.  Examination revealed the uterus was anterior, deviated to the right, normal size.  The adnexa were free of masses.  The cervix was drawn into the operative field after sterile prepping and draping.  A circumferential incision was made around the cervicovaginal junction after a dilute solution of Neo-Synephrine was injected at this site.  The bladder was pushed anteriorly, the posterior cul-de-sac was exposed, but I could not enter the posterior peritoneum.  I therefore, took extra peritoneal sutures through the uterosacral and cardinal ligaments bilaterally and suture ligated these pedicles.  I tried to enter the anterior peritoneum, but was unsuccessful, so I continued to search posteriorly and then entered the posterior peritoneal cavity.  Additional bites were taken along the sides of the uterus until it seemed that the peritoneal reflection was just at the fundus of the uterus.  I entered the anterior  peritoneum, doubly ligated both upper pedicles and removed the uterus by transecting the upper pedicles.  I have sutured the posterior peritoneum to the vaginal cuff posteriorly for hemostasis, did a rectal examination to ensure that there had been no rectal injury, filled the bladder with methylene blue stained fluid to assure that there was no leakage from the bladder.  Then, closed the peritoneal cavity with a purse-string suture of #1 Vicryl incorporating the anterior peritoneum, the upper pedicles, the lateral peritoneum, the uterosacral ligaments and the posterior peritoneum.  After the peritoneum was closed off, an additional suture was placed through the uterosacral ligaments and they were tied together.  Then, the vaginal mucosa was reapproximated with interrupted figure-of-eight sutures of 0 Vicryl.  The patient seemed to tolerate the procedure well.  Blood loss was about 300 cc.  Sponge and needle counts were correct and she was returned to recovery in satisfactory condition. Dictated by:   Malachi Pro. Ambrose Mantle, M.D. Attending Physician:  Malon Kindle DD:  11/18/00 TD:  11/18/00 Job: [id number]

## 2010-07-13 NOTE — Discharge Summary (Signed)
[facility]  Patient:    Caitlin Wall, Caitlin Wall [facility] Visit Number: [id number] MRN: [id number]          Service Type: GYN Location: 4W 0466 01 Attending Physician:  Malon Kindle Dictated by:   Malachi Pro. Ambrose Mantle, M.D. Admit Date:  11/18/2000 Discharge Date: 11/20/2000                             Discharge Summary  HISTORY OF PRESENT ILLNESS:  This is a 55 year old white female with menorrhagia and dysmenorrhea unresponsive to medical therapy for vaginal hysterectomy.  HOSPITAL COURSE:  Patient underwent a vaginal hysterectomy by Dr. Ambrose Mantle with Dr. Senaida Ores assisting under general anesthesia.  Blood loss was about 300 cc.  Postoperatively, the patient did very well.  She had no fever.  She ambulated well without difficulty.  She voided without difficulty.  She did not pass flatus but her abdomen was soft and nontender and, on the second postoperative day, she was discharged.  LABORATORY DATA:  Initial white count 5100, hemoglobin 12.6, hematocrit 36.7, platelet count 239,000.  Follow-up hematocrits 32.5 and 33.8, 54 segs, 34 lymphs, 10 monos, 2 eosinophils, and 1 basophil.  Comprehensive metabolic profile was normal.  Urinalysis was negative.  Pathology report showed uterus with no pathologic diagnosis of the cervix, secretory phase endometrium with no evidence of hyperplasia or malignancy, leiomyomata of the uterus, serosal surface is no pathologic diagnosis.  FINAL DIAGNOSIS:  Menorrhagia, dysmenorrhea unresponsive to medical therapy, small fibroids.  OPERATION:  Vaginal hysterectomy.  FINAL CONDITION:  Improved.  INSTRUCTIONS:  Liquid diet until passing flatus.  No heavy lifting or strenuous activity.  Call with any temperature elevation greater than 100.4 degrees.  Call with any vaginal bleeding more than spotting.  The patient is advised to return to the office in approximately 12 days for follow-up examination.  She is given a  prescription for [employer], #16 tablets, one every 4 to 6 hours as needed for pain. Dictated by:   Malachi Pro. Ambrose Mantle, M.D. Attending Physician:  Malon Kindle DD:  11/20/00 TD:  11/20/00 Job: [phone] [id number]

## 2010-07-13 NOTE — Procedures (Signed)
ORDERING PHYSICIAN:  Dr. Amelia Jo.   The patient is a 55 year old woman with headaches, blurry vision.  This is a  routine 17-channel EEG with one channel devoted to EKG, utilizing the  [employer] 10-20 lead placement system.  Electrographically, the patient  appears to be in the waking and briefly drowsy state.  While awake, the  background consists of very well-organized, well-developed, well-modulated  11 Hz alpha activities which are predominant in the posterior head regions  and reactive to eye opening.  During brief periods of drowsiness there is  slight attenuation of the background, decrease in frequency and amplitude.  No interhemispheric asymmetry is identified and no definite epileptiform  discharges are seen.  The EKG monitor reveals relatively regular rhythm with  a rate of approximately 72 beats per minute.  Hyperventilation and photic  stimulation did not produce significant change in the background activity.   CONCLUSIONS:  Normal awake and lightly drowsy EEG without seizure activity  or focal abnormality seen during the course of today's recording.  Clinical  correlation is recommended.     Cath   ZOX:[id number]  D:  12/27/2003 12:54:20  T:  12/27/2003 14:37:58  Job #:  [id number]

## 2010-07-13 NOTE — H&P (Signed)
[facility]  Patient:    Caitlin Wall, Caitlin Wall Visit Number: [id number] MRN: [id number]          Service Type: Attending:  Malachi Pro. Ambrose Mantle, M.D. Dictated by:   Malachi Pro. Ambrose Mantle, M.D. Adm. Date:  11/18/00                           History and Physical  HISTORY OF PRESENT ILLNESS:  This is a 55 year old white married female, para 3-0-0-3, who is admitted to the hospital for vaginal hysterectomy, possible abdominal hysterectomy, because of severe dysmenorrhea, menorrhagia, and a history of abnormal uterine bleeding.  Last menstrual period was on October 28, 2000, lasted nine days.  Previous period October 07, 2000, was seven days. In 1999 the patient reported feeling horrible with her periods.  They lasted seven days, were quite heavy, and severe pain.  She was placed on Motrin, and I did not see her again until July 2002, at which time she reported using two boxes of tampons in a few hours for two days, using the rest room every 20-30 minutes at work to clean up.  She also had pain with her menstrual periods that she rated as 5-9/10.  The patient underwent a pelvic ultrasound to see if there was any intrauterine problem that could be corrected by hysteroscopy, and no abnormality was noted.  The patient was given Anaprox, but she stated that she was still unable to perform well at her work because she was in the rest room all the time.  She claimed that she missed one day of work per month and wanted to proceed with hysterectomy.  She states that in her last period she lay on the couch for three days, slept and cried all through the menses. The patient is admitted now for hysterectomy.  PAST MEDICAL HISTORY:  Operations:  She has had an eardrum reconstructed and also has had bilateral breast implantation.  Illnesses:  No significant adult illnesses except dysplasia of the cervix requiring conization.  ALLERGIES:  CODEINE caused nausea.  FAMILY HISTORY:   Mother is 21, with high blood pressure.  Father died at 65 of cancer.  Two sisters and one brother are living and well.  REVIEW OF SYSTEMS:  Noncontributory.  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  Does not drink or smoke.  She quit smoking three years ago.  PHYSICAL EXAMINATION:  VITAL SIGNS:  Weight is 172-1/2 pounds.  Blood pressure is 130/84, pulse is 80.  GENERAL:  A well-developed, well-nourished white female in no acute distress.  HEENT:  No cranial abnormalities.  Extraocular movements are intact.  Nose and pharynx are clear.  NECK:  Supple without thyromegaly.  CHEST:  The lungs are clear to P&A.  BREASTS:  Soft.  There are bilateral implants present.  No masses are palpable.  CARDIAC:  Heart normal size and sounds, no murmur.  ABDOMEN:  Soft and nontender.  No masses are palpable.  Liver, spleen, and kidneys are not felt.  There is a tattoo in the right lower quadrant.  PELVIC:  The vulva and vagina are clean.  Pap smear on September 10, 2000, was within normal limits.  Cervix is clean.  The cervix does not descend well. The cul-de-sac is free by palpation.  The uterus is anterior, thought to be normal size.  Adnexa were clear.  Rectovaginal confirms the  above findings, and I do not feel any evidence of cul-de-sac nodularity.  ADMITTING IMPRESSION:  Dysmenorrhea and menorrhagia, uncontrolled by medical therapy.  The patient is admitted for attempted vaginal hysterectomy, possible abdominal hysterectomy.  She understands the risks of surgery, including but not limited to heart attack, stroke, pulmonary embolus, wound disruption, hemorrhage with need for reoperation and/or transfusion, fistula formation, nerve injury, and intestinal obstruction.  She reports a sex drive if a partner was available every day, and she has been informed that the surgery can have an unpredictable impact on sex  drive and if it does diminish her sex drive, there is no recognized treatment to improve the sex drive.  She understands and agrees to proceed. Dictated by:   Malachi Pro. Ambrose Mantle, M.D. Attending:  Malachi Pro. Ambrose Mantle, M.D. DD:  11/17/00 TD:  11/18/00 Job: [id number]

## 2011-01-30 ENCOUNTER — Other Ambulatory Visit: Payer: Self-pay | Admitting: Obstetrics and Gynecology

## 2011-01-30 DIAGNOSIS — Z1231 Encounter for screening mammogram for malignant neoplasm of breast: Secondary | ICD-10-CM

## 2011-02-27 ENCOUNTER — Ambulatory Visit
Admission: RE | Admit: 2011-02-27 | Discharge: 2011-02-27 | Disposition: A | Discharge status: Home / Self Care | Payer: BC Managed Care – PPO | Source: Ambulatory Visit | Attending: Obstetrics and Gynecology | Admitting: Obstetrics and Gynecology

## 2011-02-27 DIAGNOSIS — Z1231 Encounter for screening mammogram for malignant neoplasm of breast: Secondary | ICD-10-CM

## 2011-05-27 ENCOUNTER — Other Ambulatory Visit: Payer: Self-pay | Admitting: Internal Medicine

## 2011-06-13 ENCOUNTER — Encounter: Payer: BC Managed Care – PPO | Admitting: Internal Medicine

## 2011-07-25 ENCOUNTER — Ambulatory Visit (INDEPENDENT_AMBULATORY_CARE_PROVIDER_SITE_OTHER): Payer: BC Managed Care – PPO | Admitting: Internal Medicine

## 2011-07-25 ENCOUNTER — Encounter: Payer: Self-pay | Admitting: Internal Medicine

## 2011-07-25 ENCOUNTER — Other Ambulatory Visit (INDEPENDENT_AMBULATORY_CARE_PROVIDER_SITE_OTHER): Payer: BC Managed Care – PPO

## 2011-07-25 VITALS — BP 110/82 | HR 90 | Ht 65.0 in | Wt 188.1 lb

## 2011-07-25 DIAGNOSIS — Z0000 Encounter for general adult medical examination without abnormal findings: Secondary | ICD-10-CM

## 2011-07-25 DIAGNOSIS — I447 Left bundle-branch block, unspecified: Secondary | ICD-10-CM

## 2011-07-25 LAB — URINALYSIS, ROUTINE W REFLEX MICROSCOPIC
Leukocytes, UA: NEGATIVE
Specific Gravity, Urine: >=1.03 (ref 1.000–1.030)
Urobilinogen, UA: 0.2 (ref 0.0–1.0)

## 2011-07-25 LAB — HEPATIC FUNCTION PANEL
ALT: 23 U/L (ref 0–35)
Bilirubin, Direct: 0.1 mg/dL (ref 0.0–0.3)
Total Protein: 6.9 g/dL (ref 6.0–8.3)

## 2011-07-25 LAB — CBC WITH DIFFERENTIAL/PLATELET
Basophils Relative: 0.3 % (ref 0.0–3.0)
Eosinophils Absolute: 0.1 10*3/uL (ref 0.0–0.7)
HCT: 43.3 % (ref 36.0–46.0)
Lymphs Abs: 1.7 10*3/uL (ref 0.7–4.0)
MCHC: 33.3 g/dL (ref 30.0–36.0)
MCV: 91.2 fl (ref 78.0–100.0)
Monocytes Absolute: 0.8 10*3/uL (ref 0.1–1.0)
Neutrophils Relative %: 74.7 % (ref 43.0–77.0)
Platelets: 206 10*3/uL (ref 150.0–400.0)
RBC: 4.75 Mil/uL (ref 3.87–5.11)

## 2011-07-25 LAB — LIPID PANEL
Cholesterol: 173 mg/dL (ref 0–200)
LDL Cholesterol: 105 mg/dL — ABNORMAL HIGH (ref 0–99)
Triglycerides: 111 mg/dL (ref 0.0–149.0)

## 2011-07-25 LAB — BASIC METABOLIC PANEL
BUN: 15 mg/dL (ref 6–23)
Calcium: 9.4 mg/dL (ref 8.4–10.5)
Creatinine, Ser: 0.8 mg/dL (ref 0.4–1.2)
GFR: 76.03 mL/min (ref >60.00)

## 2011-07-25 MED ORDER — LOSARTAN POTASSIUM 50 MG PO TABS: 50.0000 mg | ORAL_TABLET | Freq: Every day | ORAL | Status: DC

## 2011-07-25 NOTE — Patient Instructions (Signed)
Your EKG was ok today Continue all other medications as before; your medication was refilled today Please continue your efforts at being more active, low cholesterol diet, and weight control. You are otherwise up to date with prevention Please go to LAB in the Basement for the blood and/or urine tests to be done today You will be contacted by phone if any changes need to be made immediately.  Otherwise, you will receive a letter about your results with an explanation. Please return in 1 year for your yearly visit, or sooner if needed, with Lab testing done 3-5 days before

## 2011-07-26 ENCOUNTER — Encounter: Payer: Self-pay | Admitting: Internal Medicine

## 2011-07-26 ENCOUNTER — Telehealth: Payer: Self-pay | Admitting: Internal Medicine

## 2011-07-26 DIAGNOSIS — I447 Left bundle-branch block, unspecified: Secondary | ICD-10-CM

## 2011-07-26 HISTORY — DX: Left bundle-branch block, unspecified: I44.7

## 2011-07-26 NOTE — Telephone Encounter (Signed)
Robin to contact pt -   ecg reviewed again and is c/w LBBB, an electrical abnormality, which may not be "serious" but is new for her and enough to warrant further evaluation to make sure no serious other heart condition -       1)  For echo     2)  To refer cardiology for consideration any need for further evaluation

## 2011-07-26 NOTE — Telephone Encounter (Signed)
Called the patient informed of results and referrals. 

## 2011-07-27 ENCOUNTER — Encounter: Payer: Self-pay | Admitting: Internal Medicine

## 2011-07-27 NOTE — Progress Notes (Signed)
Subjective:    Patient ID: Jerold Coombe, female    DOB: 1955-03-13, 56 y.o.   MRN: [id number]  HPI  Here for wellness and f/u;  Overall doing ok;  Pt denies CP, worsening SOB, DOE, wheezing, orthopnea, PND, worsening LE edema, palpitations, dizziness or syncope.  Pt denies neurological change such as new Headache, facial or extremity weakness.  Pt denies polydipsia, polyuria, or low sugar symptoms. Pt states overall good compliance with treatment and medications, good tolerability, and trying to follow lower cholesterol diet.  Pt denies worsening depressive symptoms, suicidal ideation or panic. No fever, wt loss, night sweats, loss of appetite, or other constitutional symptoms.  Pt states good ability with ADL's, low fall risk, home safety reviewed and adequate, no significant changes in hearing or vision, and occasionally active with exercise.  No acute complaints today Past Medical History  Diagnosis Date  . Hx of colonic polyps 1995    adenomatous  . GERD (gastroesophageal reflux disease)   . Hyperlipidemia   . Hypertension   . COLONIC POLYPS, HX OF 11/04/2007  . GERD 11/04/2007  . HYPERLIPIDEMIA 11/04/2007  . HYPERTENSION 06/07/2009  . HAIR LOSS 11/04/2007  . LBBB (left bundle branch block) 07/26/2011   Past Surgical History  Procedure Date  . Bilat ear surgury/hearing loss   . Abdominal hysterectomy     reports that she has quit smoking. She does not have any smokeless tobacco history on file. She reports that she does not drink alcohol or use illicit drugs. family history includes Breast cancer in her maternal grandmother and other; Cancer in her father; and Lung cancer in her father. Allergies  Allergen Reactions  . Codeine     REACTION: nausea,   Current Outpatient Prescriptions on File Prior to Visit  Medication Sig Dispense Refill  . aspirin 81 MG tablet Take 81 mg by mouth daily.        Marland Kitchen losartan (COZAAR) 50 MG tablet Take 1 tablet (50 mg total) by mouth daily.  90 tablet   3   Review of Systems Review of Systems  Constitutional: Negative for diaphoresis, activity change, appetite change and unexpected weight change.  HENT: Negative for hearing loss, ear pain, facial swelling, mouth sores and neck stiffness.   Eyes: Negative for pain, redness and visual disturbance.  Respiratory: Negative for shortness of breath and wheezing.   Cardiovascular: Negative for chest pain and palpitations.  Gastrointestinal: Negative for diarrhea, blood in stool, abdominal distention and rectal pain.  Genitourinary: Negative for hematuria, flank pain and decreased urine volume.  Musculoskeletal: Negative for myalgias and joint swelling.  Skin: Negative for color change and wound.  Neurological: Negative for syncope and numbness.  Hematological: Negative for adenopathy.  Psychiatric/Behavioral: Negative for hallucinations, self-injury, decreased concentration and agitation.      Objective:   Physical Exam BP 110/82  Pulse 90  Ht 5\' 5"  (1.651 m)  Wt 188 lb 2 oz (85.333 kg)  BMI 31.31 kg/m2  SpO2 97% Physical Exam  VS notedm not ill appearing Constitutional: Pt is oriented to person, place, and time. Appears well-developed and well-nourished. Lavella Lemons HENT:  Head: Normocephalic and atraumatic.  Right Ear: External ear normal.  Left Ear: External ear normal.  Nose: Nose normal.  Mouth/Throat: Oropharynx is clear and moist.  Eyes: Conjunctivae and EOM are normal. Pupils are equal, round, and reactive to light.  Neck: Normal range of motion. Neck supple. No JVD present. No tracheal deviation present.  Cardiovascular: Normal rate, regular rhythm, normal heart  sounds and intact distal pulses.   Pulmonary/Chest: Effort normal and breath sounds normal.  Abdominal: Soft. Bowel sounds are normal. There is no tenderness.  Musculoskeletal: Normal range of motion. Exhibits no edema.  Lymphadenopathy:  Has no cervical adenopathy.  Neurological: Pt is alert and oriented to person,  place, and time. Pt has normal reflexes. No cranial nerve deficit. Motor/dtr intact Skin: Skin is warm and dry. No rash noted.  Psychiatric:  Has  normal mood and affect. Behavior is normal.     Assessment & Plan:

## 2011-07-27 NOTE — Assessment & Plan Note (Signed)

## 2011-07-31 ENCOUNTER — Ambulatory Visit (HOSPITAL_COMMUNITY): Payer: BC Managed Care – PPO | Attending: Cardiovascular Disease

## 2011-07-31 DIAGNOSIS — I519 Heart disease, unspecified: Secondary | ICD-10-CM | POA: Insufficient documentation

## 2011-07-31 DIAGNOSIS — I447 Left bundle-branch block, unspecified: Secondary | ICD-10-CM | POA: Insufficient documentation

## 2011-07-31 DIAGNOSIS — I10 Essential (primary) hypertension: Secondary | ICD-10-CM | POA: Insufficient documentation

## 2011-07-31 DIAGNOSIS — E785 Hyperlipidemia, unspecified: Secondary | ICD-10-CM | POA: Insufficient documentation

## 2011-07-31 NOTE — Progress Notes (Signed)
Echocardiogram performed.  

## 2011-08-01 ENCOUNTER — Encounter: Payer: Self-pay | Admitting: Internal Medicine

## 2011-08-20 ENCOUNTER — Ambulatory Visit (INDEPENDENT_AMBULATORY_CARE_PROVIDER_SITE_OTHER): Payer: BC Managed Care – PPO | Admitting: Cardiovascular Disease

## 2011-08-20 ENCOUNTER — Encounter: Payer: Self-pay | Admitting: Cardiovascular Disease

## 2011-08-20 VITALS — BP 134/75 | HR 83 | Ht 65.0 in | Wt 189.0 lb

## 2011-08-20 DIAGNOSIS — I447 Left bundle-branch block, unspecified: Secondary | ICD-10-CM

## 2011-08-20 NOTE — Patient Instructions (Addendum)
Your physician recommends that you schedule a follow-up appointment as needed with Dr. McAlhany   

## 2011-08-20 NOTE — Progress Notes (Signed)
History of Present Illness: 56 yo female with history of HTN, borderline HLD, and LBBB who is referred today as a new patient for cardiac evaluation. Her EKG on 07/25/11 with LBBB. She says this is a change from old EKG. Echo arranged by primary care showed normal LV size and function with mild LVH, no valvular issues. She tells me that she feels great. No chest pains, SOB, palpitations. No prior cardiac issues.   Primary Care Physician: Oliver Barre  Past Medical History  Diagnosis Date  . Hx of colonic polyps 1995    adenomatous  . GERD (gastroesophageal reflux disease)   . Hyperlipidemia   . Hypertension   . COLONIC POLYPS, HX OF 11/04/2007  . GERD 11/04/2007  . HYPERLIPIDEMIA 11/04/2007  . HYPERTENSION 06/07/2009  . HAIR LOSS 11/04/2007  . LBBB (left bundle branch block) 07/26/2011    Past Surgical History  Procedure Date  . Bilat ear surgury/hearing loss   . Abdominal hysterectomy   . Anterior fusion cervical spine     Current Outpatient Prescriptions  Medication Sig Dispense Refill  . aspirin 81 MG tablet Take 81 mg by mouth daily.        Marland Kitchen losartan (COZAAR) 50 MG tablet Take 1 tablet (50 mg total) by mouth daily.  90 tablet  3    Allergies  Allergen Reactions  . Codeine     REACTION: nausea,    History   Social History  . Marital Status: Married    Spouse Name: N/A    Number of Children: 3  . Years of Education: N/A   Occupational History  . accounts receivable    Social History Main Topics  . Smoking status: Former Smoker -- 1.0 packs/day for 20 years    Types: Cigarettes    Quit date: 08/19/1989  . Smokeless tobacco: Not on file  . Alcohol Use: No  . Drug Use: No  . Sexually Active: Not on file   Other Topics Concern  . Not on file   Social History Narrative  . No narrative on file    Family History  Problem Relation Age of Onset  . Lung cancer Father   . Breast cancer Maternal Grandmother   . Breast cancer Other   . Coronary artery disease Neg Hx      Review of Systems:  As stated in the HPI and otherwise negative.   BP 134/75  Pulse 83  Ht 5\' 5"  (1.651 m)  Wt 189 lb (85.73 kg)  BMI 31.45 kg/m2  Physical Examination: General: Well developed, well nourished, NAD HEENT: OP clear, mucus membranes moist SKIN: warm, dry. No rashes. Neuro: No focal deficits Musculoskeletal: Muscle strength 5/5 all ext Psychiatric: Mood and affect normal Neck: No JVD, no carotid bruits, no thyromegaly, no lymphadenopathy. Lungs:Clear bilaterally, no wheezes, rhonci, crackles Cardiovascular: Regular rate and rhythm. No murmurs, gallops or rubs. Abdomen:Soft. Bowel sounds present. Non-tender.  Extremities: No lower extremity edema. Pulses are 2 + in the bilateral DP/PT.  EKG: NSR, rate 90 bpm. LBBB  Echo 07/31/11:  Left ventricle: The cavity size was normal. Wall thickness was increased in a pattern of mild LVH. Systolic function was normal. The estimated ejection fraction was in the range of 55% to 65%. Wall motion was normal; there were no regional wall motion abnormalities. Doppler parameters are consistent with abnormal left ventricular relaxation (grade 1 diastolic dysfunction). - Ventricular septum: Septal motion showed paradox. - Atrial septum: No defect or patent foramen ovale was identified. -  Pulmonary arteries: PA peak pressure: 31mm Hg (S).

## 2011-08-20 NOTE — Assessment & Plan Note (Signed)
LBBB is chronic. LV function and LV wall motion normal on echo. No complaints of chest pain concerning for angina. No further workup at this time. She will call and return as needed.

## 2012-06-04 ENCOUNTER — Telehealth: Payer: Self-pay

## 2012-06-04 DIAGNOSIS — Z0000 Encounter for general adult medical examination without abnormal findings: Secondary | ICD-10-CM

## 2012-06-04 NOTE — Telephone Encounter (Signed)
Labs entered.

## 2012-07-24 ENCOUNTER — Other Ambulatory Visit (INDEPENDENT_AMBULATORY_CARE_PROVIDER_SITE_OTHER): Payer: BC Managed Care – PPO

## 2012-07-24 DIAGNOSIS — Z0000 Encounter for general adult medical examination without abnormal findings: Secondary | ICD-10-CM

## 2012-07-24 LAB — HEPATIC FUNCTION PANEL
ALT: 20 U/L (ref 0–35)
AST: 26 U/L (ref 0–37)
Albumin: 3.6 g/dL (ref 3.5–5.2)
Alkaline Phosphatase: 102 U/L (ref 39–117)
Total Protein: 6.9 g/dL (ref 6.0–8.3)

## 2012-07-24 LAB — CBC WITH DIFFERENTIAL/PLATELET
MCV: 89.8 fl (ref 78.0–100.0)
Platelets: 204 10*3/uL (ref 150.0–400.0)
RBC: 4.78 Mil/uL (ref 3.87–5.11)
WBC: 7.3 10*3/uL (ref 4.5–10.5)

## 2012-07-24 LAB — URINALYSIS, ROUTINE W REFLEX MICROSCOPIC
Bilirubin Urine: NEGATIVE
Ketones, ur: NEGATIVE
Leukocytes, UA: NEGATIVE
RBC / HPF: NONE SEEN (ref 0–?)
Specific Gravity, Urine: 1.01 (ref 1.000–1.030)
Urine Glucose: NEGATIVE
Urobilinogen, UA: 0.2 (ref 0.0–1.0)

## 2012-07-24 LAB — BASIC METABOLIC PANEL
BUN: 13 mg/dL (ref 6–23)
CO2: 29 mEq/L (ref 19–32)
Chloride: 107 mEq/L (ref 96–112)
Glucose, Bld: 84 mg/dL (ref 70–99)
Potassium: 3.6 mEq/L (ref 3.5–5.1)
Sodium: 142 mEq/L (ref 135–145)

## 2012-07-24 LAB — LIPID PANEL
HDL: 40.6 mg/dL (ref >39.00)
LDL Cholesterol: 107 mg/dL — ABNORMAL HIGH (ref 0–99)
VLDL: 14.8 mg/dL (ref 0.0–40.0)

## 2012-07-24 LAB — TSH: TSH: 1.36 u[IU]/mL (ref 0.35–5.50)

## 2012-07-27 ENCOUNTER — Ambulatory Visit (INDEPENDENT_AMBULATORY_CARE_PROVIDER_SITE_OTHER)
Admission: RE | Admit: 2012-07-27 | Discharge: 2012-07-27 | Disposition: A | Discharge status: Home / Self Care | Payer: 59 | Source: Ambulatory Visit | Attending: Internal Medicine | Admitting: Internal Medicine

## 2012-07-27 ENCOUNTER — Ambulatory Visit (INDEPENDENT_AMBULATORY_CARE_PROVIDER_SITE_OTHER): Payer: 59 | Admitting: Internal Medicine

## 2012-07-27 ENCOUNTER — Encounter: Payer: Self-pay | Admitting: Internal Medicine

## 2012-07-27 VITALS — BP 120/88 | HR 77 | Temp 97.7°F | Ht 65.0 in | Wt 183.0 lb

## 2012-07-27 DIAGNOSIS — J309 Allergic rhinitis, unspecified: Secondary | ICD-10-CM

## 2012-07-27 DIAGNOSIS — J302 Other seasonal allergic rhinitis: Secondary | ICD-10-CM | POA: Insufficient documentation

## 2012-07-27 DIAGNOSIS — J45909 Unspecified asthma, uncomplicated: Secondary | ICD-10-CM

## 2012-07-27 DIAGNOSIS — I10 Essential (primary) hypertension: Secondary | ICD-10-CM

## 2012-07-27 DIAGNOSIS — Z0000 Encounter for general adult medical examination without abnormal findings: Secondary | ICD-10-CM

## 2012-07-27 DIAGNOSIS — E785 Hyperlipidemia, unspecified: Secondary | ICD-10-CM

## 2012-07-27 DIAGNOSIS — J45998 Other asthma: Secondary | ICD-10-CM

## 2012-07-27 DIAGNOSIS — J4520 Mild intermittent asthma, uncomplicated: Secondary | ICD-10-CM | POA: Insufficient documentation

## 2012-07-27 HISTORY — DX: Unspecified asthma, uncomplicated: J45.909

## 2012-07-27 MED ORDER — ALBUTEROL SULFATE HFA 108 (90 BASE) MCG/ACT IN AERS: 2.0000 | INHALATION_SPRAY | Freq: Four times a day (QID) | RESPIRATORY_TRACT | Status: DC | PRN

## 2012-07-27 MED ORDER — PREDNISONE 10 MG PO TABS: ORAL_TABLET | ORAL | Status: DC

## 2012-07-27 MED ORDER — FLUTICASONE-SALMETEROL 250-50 MCG/DOSE IN AEPB: 1.0000 | INHALATION_SPRAY | Freq: Two times a day (BID) | RESPIRATORY_TRACT | Status: DC

## 2012-07-27 NOTE — Patient Instructions (Addendum)
Please use the Advair at 1 puff twice day (the sample should last 2 wks), then continue daily regular use with the prescription  Remember to rinse mouth after each use advair Please take all new medication as prescribed - the Prednisone (low dose, short course) Please take all new medication as prescribed - the Proair HFA inhaler (or similar depending on your insurance) AS NEEDED to help the symptoms Please remember the inhalers are all brand name currently, and there are no generics available that cost less You should also consider taking OTC Allegra daily for the nasal allergy symptoms Please continue all other medications as before Please remember to followup with your GYN for the yearly pap smear and/or mammogram You are otherwise up to date with prevention measures today. Please continue your efforts at being more active, low cholesterol diet, and weight control. Please go to the XRAY Department in the Basement (go straight as you get off the elevator) for the x-ray testing You will be contacted regarding the referral for: PFT's (pulmonary function tests), and Pulmonary referral You will be contacted by phone if any changes need to be made immediately.  Otherwise, you will receive a letter about your results with an explanation  Please remember to sign up for My Chart if you have not done so, as this will be important to you in the future with finding out test results, communicating by private email, and scheduling acute appointments online when needed.  Please return in 6 months, or sooner if needed

## 2012-07-27 NOTE — Assessment & Plan Note (Signed)
stable overall by history and exam, recent data reviewed with pt, and pt to continue medical treatment as before,  to f/u any worsening symptoms or concerns BP Readings from Last 3 Encounters:  07/27/12 120/88  08/20/11 134/75  07/25/11 110/82

## 2012-07-27 NOTE — Assessment & Plan Note (Signed)
New clincial dx, for cxr, pft's, refer pulm and empriic advair 250, proair prn, and initial short course predpack low dose given the severity of her symtpoms and tearful today

## 2012-07-27 NOTE — Assessment & Plan Note (Signed)
Improved, cont low chol diet  Lab Results  Component Value Date   LDLCALC 107* 07/24/2012

## 2012-07-27 NOTE — Progress Notes (Signed)
Subjective:    Patient ID: Caitlin Wall, female    DOB: [birth year], 57 y.o.   MRN: [id number]  HPI  Here for wellness and f/u;  Overall doing ok;  Pt denies CP, wheezing, orthopnea, PND, worsening LE edema, palpitations, dizziness or syncope  But has had mild to mod worsening sob and DOE now with some breathless at 100 ft, as well as ADL's in the am such as bathing. Started late March 2014, overall the same severity but can be midl worse at times.  No smoking/quit 1991 , prior1 ppd x 15 yrs.  No recent cxr or pft'. Echo with normal ER, gr 1 diast dysfxn jun 2013.  Has never had problem before, no hx of inhaler use. Does have several wks ongoing nasal allergy symptoms with clearish congestion, itch and sneezing, without fever, pain, ST, cough, swelling or wheezing. [place]    Pt denies neurological change such as new headache, facial or extremity weakness.  Pt denies polydipsia, polyuria, or low sugar symptoms. Pt states overall good compliance with treatment and medications, good tolerability, and has been trying to follow lower cholesterol diet.  Pt denies worsening depressive symptoms, suicidal ideation or panic. No fever, night sweats, wt loss, loss of appetite, or other constitutional symptoms.  Pt states good ability with ADL's, has low fall risk, home safety reviewed and adequate, no other significant changes in hearing or vision, and only occasionally active with exercise. Past Medical History  Diagnosis Date  . Hx of colonic polyps 1995    adenomatous  . GERD (gastroesophageal reflux disease)   . Hyperlipidemia   . Hypertension   . COLONIC POLYPS, HX OF 11/04/2007  . GERD 11/04/2007  . HYPERLIPIDEMIA 11/04/2007  . HYPERTENSION 06/07/2009  . HAIR LOSS 11/04/2007  . LBBB (left bundle branch block) 07/26/2011   Past Surgical History  Procedure Laterality Date  . Bilat ear surgury/hearing loss    . Abdominal hysterectomy    . Anterior fusion cervical spine      reports that she  quit smoking about 22 years ago. Her smoking use included Cigarettes. She has a 20 pack-year smoking history. She does not have any smokeless tobacco history on file. She reports that she does not drink alcohol or use illicit drugs. family history includes Breast cancer in her maternal grandmother and other and Lung cancer in her father.  There is no history of Coronary artery disease. Allergies  Allergen Reactions  . Codeine     REACTION: nausea,   Current Outpatient Prescriptions on File Prior to Visit  Medication Sig Dispense Refill  . aspirin 81 MG tablet Take 81 mg by mouth daily.        Marland Kitchen losartan (COZAAR) 50 MG tablet Take 1 tablet (50 mg total) by mouth daily.  90 tablet  3   No current facility-administered medications on file prior to visit.   Review of Systems Constitutional: Negative for diaphoresis, activity change, appetite change or unexpected weight change.  HENT: Negative for hearing loss, ear pain, facial swelling, mouth sores and neck stiffness.   Eyes: Negative for pain, redness and visual disturbance.  Respiratory: Negative for shortness of breath and wheezing.   Cardiovascular: Negative for chest pain and palpitations.  Gastrointestinal: Negative for diarrhea, blood in stool, abdominal distention or other pain Genitourinary: Negative for hematuria, flank pain or change in urine volume.  Musculoskeletal: Negative for myalgias and joint swelling.  Skin: Negative for color change and wound.  Neurological: Negative for  syncope and numbness. other than noted Hematological: Negative for adenopathy.  Psychiatric/Behavioral: Negative for hallucinations, self-injury, decreased concentration and agitation.      Objective:   Physical Exam BP 120/88  Pulse 77  Temp(Src) 97.7 F (36.5 C) (Oral)  Ht 5\' 5"  (1.651 m)  Wt 183 lb (83.008 kg)  BMI 30.45 kg/m2  SpO2 97% VS noted,  Constitutional: Pt is oriented to person, place, and time. Appears well-developed and  well-nourished.  Head: Normocephalic and atraumatic.  Right Ear: External ear normal.  Left Ear: External ear normal.  Nose: Nose normal.  Mouth/Throat: Oropharynx is clear and moist.  Eyes: Conjunctivae and EOM are normal. Pupils are equal, round, and reactive to light.  Neck: Normal range of motion. Neck supple. No JVD present. No tracheal deviation present.  Cardiovascular: Normal rate, regular rhythm, normal heart sounds and intact distal pulses.   Pulmonary/Chest: Effort normal and breath sounds mild decreased blat.  Abdominal: Soft. Bowel sounds are normal. There is no tenderness. No HSM  Musculoskeletal: Normal range of motion. Exhibits no edema.  Lymphadenopathy:  Has no cervical adenopathy.  Neurological: Pt is alert and oriented to person, place, and time. Pt has normal reflexes. No cranial nerve deficit.  Skin: Skin is warm and dry. No rash noted.  Psychiatric:  Has  normal mood and affect. Behavior is normal.     Assessment & Plan:

## 2012-07-27 NOTE — Assessment & Plan Note (Signed)

## 2012-07-27 NOTE — Assessment & Plan Note (Signed)
Ok for allegra otc prn 

## 2012-08-03 ENCOUNTER — Other Ambulatory Visit: Payer: Self-pay | Admitting: Internal Medicine

## 2012-08-11 ENCOUNTER — Ambulatory Visit (INDEPENDENT_AMBULATORY_CARE_PROVIDER_SITE_OTHER): Payer: 59 | Admitting: Internal Medicine

## 2012-08-11 DIAGNOSIS — J45909 Unspecified asthma, uncomplicated: Secondary | ICD-10-CM

## 2012-08-11 DIAGNOSIS — J309 Allergic rhinitis, unspecified: Secondary | ICD-10-CM

## 2012-08-11 LAB — PULMONARY FUNCTION TEST

## 2012-08-11 NOTE — Progress Notes (Signed)
PFT done today. 

## 2012-08-31 ENCOUNTER — Other Ambulatory Visit: Payer: 59

## 2012-08-31 ENCOUNTER — Ambulatory Visit (INDEPENDENT_AMBULATORY_CARE_PROVIDER_SITE_OTHER): Payer: 59 | Admitting: Internal Medicine

## 2012-08-31 ENCOUNTER — Encounter: Payer: Self-pay | Admitting: Internal Medicine

## 2012-08-31 VITALS — BP 130/90 | HR 89 | Ht 65.25 in | Wt 179.4 lb

## 2012-08-31 DIAGNOSIS — J45909 Unspecified asthma, uncomplicated: Secondary | ICD-10-CM

## 2012-08-31 DIAGNOSIS — J45998 Other asthma: Secondary | ICD-10-CM

## 2012-08-31 DIAGNOSIS — R0609 Other forms of dyspnea: Secondary | ICD-10-CM

## 2012-08-31 DIAGNOSIS — R0989 Other specified symptoms and signs involving the circulatory and respiratory systems: Secondary | ICD-10-CM

## 2012-08-31 DIAGNOSIS — R0600 Dyspnea, unspecified: Secondary | ICD-10-CM

## 2012-08-31 LAB — D-DIMER, QUANTITATIVE: D-Dimer, Quant: 0.72 ug/mL-FEU — ABNORMAL HIGH (ref 0.00–0.48)

## 2012-08-31 MED ORDER — FLUTICASONE FUROATE-VILANTEROL 100-25 MCG/INH IN AEPB: 1.0000 | INHALATION_SPRAY | Freq: Every day | RESPIRATORY_TRACT | Status: DC

## 2012-08-31 NOTE — Patient Instructions (Addendum)
Sample Breo Ellipta  1 puff, then rinse mouth, once daily     Try this instead of Advair for comparison. When the sample runs out, go back to your Advair.  Order- lab- Allergy profile, D-dimer     Dx asthma, dyspnea
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
## 2012-09-01 LAB — ALLERGY FULL PROFILE
Alternaria Alternata: <0.1 kU/L
Bahia Grass: <0.1 kU/L
Bermuda Grass: <0.1 kU/L
Box Elder IgE: <0.1 kU/L
Candida Albicans: <0.1 kU/L
Cat Dander: <0.1 kU/L
Curvularia lunata: <0.1 kU/L
D. farinae: <0.1 kU/L
Elm IgE: <0.1 kU/L
Fescue: <0.1 kU/L
G009 Red Top: <0.1 kU/L
Lamb's Quarters: <0.1 kU/L
Oak: <0.1 kU/L
Sycamore Tree: <0.1 kU/L
Timothy Grass: <0.1 kU/L

## 2012-09-01 NOTE — Progress Notes (Signed)
Quick Note:  Left message w/ pt's husband TCB ______

## 2012-09-02 ENCOUNTER — Telehealth: Payer: Self-pay | Admitting: Internal Medicine

## 2012-09-02 DIAGNOSIS — I2699 Other pulmonary embolism without acute cor pulmonale: Secondary | ICD-10-CM

## 2012-09-02 NOTE — Progress Notes (Signed)
Quick Note:  Pt aware per 7.9.14 phone note; Ct Angio and bmet ordered ______

## 2012-09-02 NOTE — Telephone Encounter (Signed)
Per 7.7.14 lab result note by CDY:  Notes Recorded by Nita Sells, CMA on 09/01/2012 at 5:47 PM ATC x 1 NA Notes Recorded by Sherre Lain, MA on 09/01/2012 at 5:34 PM Left message w/ pt's husband TCB Notes Recorded by Waymon Budge, MD on 09/01/2012 at 5:13 PM The allergy profile is negative, not showing elevated allergy class antibodies.  The D-dimer is higher than expected. That means blood clots cannot be ruled out as a reason for her shortness of breath. While my level of suspicion is not high, this is not a casual issue because blood clots can be subtle and fatal. I recommend order BMET and CT angiogram for dx Pulmonary Embolism  Called spoke with patient, discussed lab results / recs as stated by CDY above.  Pt verbalized her understanding and denied any questions.  Pt stated she is unable to do CT today d/t work, but will be able to do this tomorrow.  CT Angio ordered STAT, PE protocol; bmet ordered STAT as well.  Pt aware she will receive call to schedule and regarding results when they are available.  Nothing further needed at this time; will sign off.

## 2012-09-03 ENCOUNTER — Telehealth: Payer: Self-pay | Admitting: Internal Medicine

## 2012-09-03 NOTE — Telephone Encounter (Signed)
Spoke with patient and advised pt that I would contact her 1st thing in the am to schedule CT. Advised pt to not eat or drink anything after midnight. [name]

## 2012-09-03 NOTE — Telephone Encounter (Signed)
Please advise PCC;s thanks 

## 2012-09-04 ENCOUNTER — Ambulatory Visit (INDEPENDENT_AMBULATORY_CARE_PROVIDER_SITE_OTHER)
Admission: RE | Admit: 2012-09-04 | Discharge: 2012-09-04 | Disposition: A | Discharge status: Home / Self Care | Payer: 59 | Source: Ambulatory Visit | Attending: Internal Medicine | Admitting: Internal Medicine

## 2012-09-04 ENCOUNTER — Other Ambulatory Visit: Payer: Self-pay | Admitting: Internal Medicine

## 2012-09-04 DIAGNOSIS — R911 Solitary pulmonary nodule: Secondary | ICD-10-CM

## 2012-09-04 DIAGNOSIS — I2699 Other pulmonary embolism without acute cor pulmonale: Secondary | ICD-10-CM

## 2012-09-04 MED ORDER — IOHEXOL 350 MG/ML SOLN
80.0000 mL | Freq: Once | INTRAVENOUS | Status: AC | PRN
  Administered 2012-09-04: 80 mL via INTRAVENOUS

## 2012-09-04 MED ORDER — CLARITHROMYCIN 500 MG PO TABS: 500.0000 mg | ORAL_TABLET | Freq: Two times a day (BID) | ORAL | Status: DC

## 2012-09-04 NOTE — Telephone Encounter (Signed)
CTA Chest scheduled for 09/04/12 at 10:45 at [facility]. NPO 2 hours before. Pt has been contacted and is aware of appointment date, time and location. [name]

## 2012-09-13 NOTE — Assessment & Plan Note (Signed)
Despite her smoking history, PFTs are most consistent with a mild asthma type reactive airways disease. She thinks the triggering event was smoke exposure which would indicate Reactive Airways Dysfunction Syndrome/ RADS presumably from a smoke-induced bronchiolitis. Plan-allergy profile, d-dimer to exclude possibility of pulmonary embolism causing more dyspnea than PFTs would suggest, try Breo Ellipta.

## 2012-09-14 ENCOUNTER — Telehealth: Payer: Self-pay | Admitting: Internal Medicine

## 2012-09-14 NOTE — Telephone Encounter (Signed)
Per CY patient needs to be seen sooner than already scheduled 9/4 appt Spoke with patient, patient has been scheduled to be seen 10/02/12 @ 4pm Nothing further at this time

## 2012-09-20 ENCOUNTER — Ambulatory Visit (INDEPENDENT_AMBULATORY_CARE_PROVIDER_SITE_OTHER): Payer: 59 | Admitting: Family Medicine

## 2012-09-20 VITALS — BP 130/80 | HR 68 | Temp 98.1°F | Resp 16 | Ht 64.0 in | Wt 176.0 lb

## 2012-09-20 DIAGNOSIS — L03113 Cellulitis of right upper limb: Secondary | ICD-10-CM

## 2012-09-20 DIAGNOSIS — L089 Local infection of the skin and subcutaneous tissue, unspecified: Secondary | ICD-10-CM

## 2012-09-20 DIAGNOSIS — L03119 Cellulitis of unspecified part of limb: Secondary | ICD-10-CM

## 2012-09-20 DIAGNOSIS — L02519 Cutaneous abscess of unspecified hand: Secondary | ICD-10-CM

## 2012-09-20 MED ORDER — CEPHALEXIN 500 MG PO CAPS: 500.0000 mg | ORAL_CAPSULE | Freq: Three times a day (TID) | ORAL | Status: DC

## 2012-09-20 MED ORDER — FLUCONAZOLE 150 MG PO TABS: 150.0000 mg | ORAL_TABLET | Freq: Once | ORAL | Status: DC

## 2012-09-20 MED ORDER — DOXYCYCLINE HYCLATE 100 MG PO CAPS: 100.0000 mg | ORAL_CAPSULE | Freq: Two times a day (BID) | ORAL | Status: DC

## 2012-09-20 NOTE — Patient Instructions (Addendum)
1.  Take Claritin or Zyrtec 10mg  one daily.   2.  Take Benadryl 25mg  at bedtime. 3.  Take antibiotics as prescribed. 4. Elevate arm. 5.  Return for increased swelling, increased redness, or increased pain.

## 2012-09-20 NOTE — Progress Notes (Signed)
[address]   [place], [place]  [address]   [phone]  Subjective:    Patient ID: Caitlin Wall, female    DOB: 1955/12/02, 57 y.o.   MRN: [id number]  HPI This 57 y.o. female presents for evaluation of insect bite.  Bite occurred overnight.  Did not witness bite; had two little bite marks.  ?Tick bite?  Redness developed around bite.  Redness is extending.  Mild itching.  Mild discomfort.  No fever/chills/sweats.  No pain with movement of thumb, wrist.  No malaise or fatigue.   Last Tetanus vaccine UTD.  No facial swelling; no throat swelling or tongue swelling; no SOB.  R hand dominant; works in [employer] operational officer.   Review of Systems  Constitutional: Negative for fever, chills, diaphoresis and fatigue.  Musculoskeletal: Negative for myalgias, joint swelling and arthralgias.  Skin: Positive for color change and wound. Negative for pallor.  Neurological: Negative for weakness and numbness.    Past Medical History  Diagnosis Date  . Hx of colonic polyps 1995    adenomatous  . GERD (gastroesophageal reflux disease)   . Hyperlipidemia   . Hypertension   . COLONIC POLYPS, HX OF 11/04/2007  . GERD 11/04/2007  . HYPERLIPIDEMIA 11/04/2007  . HYPERTENSION 06/07/2009  . HAIR LOSS 11/04/2007  . LBBB (left bundle branch block) 07/26/2011  . Asthma, persistent not controlled 07/27/2012    Past Surgical History  Procedure Laterality Date  . Bilat ear surgury/hearing loss    . Abdominal hysterectomy    . Anterior fusion cervical spine      Prior to Admission medications   Medication Sig Start Date End Date Taking? Authorizing Provider  albuterol (PROVENTIL HFA;VENTOLIN HFA) 108 (90 BASE) MCG/ACT inhaler Inhale 2 puffs into the lungs every 6 (six) hours as needed for wheezing. 07/27/12  Yes Corwin Levins, MD  aspirin 81 MG tablet Take 81 mg by mouth daily.     Yes Historical Provider, MD  Fluticasone Furoate-Vilanterol (BREO ELLIPTA) 100-25 MCG/INH AEPB Inhale 1 puff into the lungs daily.  08/31/12  Yes Waymon Budge, MD  Fluticasone-Salmeterol (ADVAIR DISKUS) 250-50 MCG/DOSE AEPB Inhale 1 puff into the lungs 2 (two) times daily. 07/27/12  Yes Corwin Levins, MD  losartan (COZAAR) 50 MG tablet TAKE ONE TABLET BY MOUTH EVERY DAY 08/03/12  Yes Corwin Levins, MD  doxycycline (VIBRAMYCIN) 100 MG capsule Take 1 capsule (100 mg total) by mouth 2 (two) times daily. 09/20/12   Ethelda Chick, MD  fluconazole (DIFLUCAN) 150 MG tablet Take 1 tablet (150 mg total) by mouth once. Repeat if needed 09/20/12   Ethelda Chick, MD    Allergies  Allergen Reactions  . Codeine     REACTION: nausea,    History   Social History  . Marital Status: Married    Spouse Name: N/A    Number of Children: 3  . Years of Education: N/A   Occupational History  . accounts receivable    Social History Main Topics  . Smoking status: Former Smoker -- 1.00 packs/day for 20 years    Types: Cigarettes    Quit date: 08/19/1989  . Smokeless tobacco: Never Used  . Alcohol Use: No  . Drug Use: No  . Sexually Active: Not on file   Other Topics Concern  . Not on file   Social History Narrative  . No narrative on file    Family History  Problem Relation Age of Onset  . Lung cancer Father   .  Breast cancer Maternal Grandmother   . Breast cancer Other   . Coronary artery disease Neg Hx        Objective:   Physical Exam  Nursing note and vitals reviewed. Constitutional: She is oriented to person, place, and time. She appears well-developed and well-nourished. No distress.  Cardiovascular: Normal rate and regular rhythm.   Pulmonary/Chest: Effort normal and breath sounds normal. She has no wheezes. She has no rales.  Neurological: She is alert and oriented to person, place, and time.  Skin: She is not diaphoretic. There is erythema.  R hand: single 2 mm puncture wound; adjacent to small puncture wound, 5 mm pustular lesion with surrounding induration and erythema; erythema extends into wrist and distal  forearm with streaking to distal forearm.  No R axillary LAD.  Psychiatric: She has a normal mood and affect. Her behavior is normal.        Assessment & Plan:  Insect bite finger-infected, initial encounter  Pain of hand, right  Cellulitis of hand, right  1. Insect bite R hand/thumb: New.  Tetanus UTD; local wound care.  Recommend Claritin or Zyrtec 10mg  daily for allergic component to swelling/redness.  Recommend Benadryl 25mg  qhs. 2.  Pain of R hand:  New. Secondary to insect bite with secondary infection. 3. Cellulitis R hand: New. Secondary to insect bite. Treat with Doxycycline and Keflex.  Diflucan also provided.  RTC immediately for fever, increasing swelling/redness/pain.  Meds ordered this encounter  Medications  . doxycycline (VIBRAMYCIN) 100 MG capsule    Sig: Take 1 capsule (100 mg total) by mouth 2 (two) times daily.    Dispense:  20 capsule    Refill:  0  . fluconazole (DIFLUCAN) 150 MG tablet    Sig: Take 1 tablet (150 mg total) by mouth once. Repeat if needed    Dispense:  2 tablet    Refill:  0  . cephALEXin (KEFLEX) 500 MG capsule    Sig: Take 1 capsule (500 mg total) by mouth 3 (three) times daily.    Dispense:  30 capsule    Refill:  0    Decided to add Cephalexin after patient left office; please let her know I want her to take both Doxycycline and this.  Thanks!

## 2012-09-28 ENCOUNTER — Encounter: Payer: Self-pay | Admitting: Gastroenterology

## 2012-10-02 ENCOUNTER — Ambulatory Visit (INDEPENDENT_AMBULATORY_CARE_PROVIDER_SITE_OTHER): Payer: 59 | Admitting: Internal Medicine

## 2012-10-02 ENCOUNTER — Encounter: Payer: Self-pay | Admitting: Internal Medicine

## 2012-10-02 ENCOUNTER — Ambulatory Visit (INDEPENDENT_AMBULATORY_CARE_PROVIDER_SITE_OTHER)
Admission: RE | Admit: 2012-10-02 | Discharge: 2012-10-02 | Disposition: A | Discharge status: Home / Self Care | Payer: 59 | Source: Ambulatory Visit | Attending: Internal Medicine | Admitting: Internal Medicine

## 2012-10-02 VITALS — BP 132/80 | HR 66 | Ht 65.0 in | Wt 177.8 lb

## 2012-10-02 DIAGNOSIS — J45909 Unspecified asthma, uncomplicated: Secondary | ICD-10-CM

## 2012-10-02 DIAGNOSIS — R911 Solitary pulmonary nodule: Secondary | ICD-10-CM

## 2012-10-02 DIAGNOSIS — J4489 Other specified chronic obstructive pulmonary disease: Secondary | ICD-10-CM

## 2012-10-02 DIAGNOSIS — J189 Pneumonia, unspecified organism: Secondary | ICD-10-CM

## 2012-10-02 DIAGNOSIS — J449 Chronic obstructive pulmonary disease, unspecified: Secondary | ICD-10-CM

## 2012-10-02 DIAGNOSIS — R9389 Abnormal findings on diagnostic imaging of other specified body structures: Secondary | ICD-10-CM

## 2012-10-02 DIAGNOSIS — J45998 Other asthma: Secondary | ICD-10-CM

## 2012-10-02 MED ORDER — UMECLIDINIUM-VILANTEROL 62.5-25 MCG/INH IN AEPB: 1.0000 | INHALATION_SPRAY | Freq: Every day | RESPIRATORY_TRACT | Status: DC
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
## 2012-10-02 NOTE — Patient Instructions (Addendum)
Order- CXR  Today--    COPD, RUL pneumonia  Order- future non-contrast CT chest in 4 months, before next ov    Dx pneumonia  Sample     Anoro Ellipta   1 puff, one time daily   Try this instead of Advair   When the sample is used up, go back to Advair  Please call as needed

## 2012-10-06 ENCOUNTER — Encounter: Payer: Self-pay | Admitting: Gastroenterology

## 2012-10-07 NOTE — Progress Notes (Signed)
Quick Note:  Pt aware of results. ______ 

## 2012-10-14 DIAGNOSIS — R9389 Abnormal findings on diagnostic imaging of other specified body structures: Secondary | ICD-10-CM | POA: Insufficient documentation

## 2012-10-14 DIAGNOSIS — R918 Other nonspecific abnormal finding of lung field: Secondary | ICD-10-CM | POA: Insufficient documentation

## 2012-10-14 NOTE — Assessment & Plan Note (Signed)
She has had cephalexin, doxycycline, Biaxin. If this is pneumonia we should be seeing response. We need to see this result. Plan-chest x-ray

## 2012-10-14 NOTE — Assessment & Plan Note (Signed)
Dyspnea is improved but still noticed with exertion. Uncertain relation to abnormal chest CT. PFTs are better than expected. Need to watch for additional reasons for dyspnea.

## 2012-10-14 NOTE — Assessment & Plan Note (Signed)
As discussed with her

## 2012-10-23 ENCOUNTER — Telehealth: Payer: Self-pay

## 2012-10-23 NOTE — Telephone Encounter (Signed)
Recent pulmonary embolism

## 2012-10-24 NOTE — Telephone Encounter (Signed)
Caitlin Wall  She appears OK.  Please ask if her breathing continues to improve and refer her to Dr. Russella Dar for his opinion.  Thanks,  [name]

## 2012-10-27 ENCOUNTER — Ambulatory Visit (AMBULATORY_SURGERY_CENTER): Payer: 59

## 2012-10-27 VITALS — Ht 65.0 in | Wt 176.0 lb

## 2012-10-27 DIAGNOSIS — Z860100 Personal history of colon polyps, unspecified: Secondary | ICD-10-CM

## 2012-10-27 MED ORDER — SOD PICOSULFATE-MAG OX-CIT ACD 10-3.5-12 MG-GM-GM PO PACK: 1.0000 | PACK | Freq: Once | ORAL | Status: DC

## 2012-10-27 NOTE — Telephone Encounter (Signed)
Breathing good at this time.  Uses inhalers.  No o2.  No dyspnea with exertion.

## 2012-10-28 ENCOUNTER — Encounter: Payer: Self-pay | Admitting: Gastroenterology

## 2012-10-28 NOTE — Telephone Encounter (Signed)
I reviewed the recent Pulm note. Has COPD and pneumonia that was treated in mid July. Pulmonary status appears stable at this time. OK for [facility].

## 2012-10-29 ENCOUNTER — Ambulatory Visit: Payer: 59 | Admitting: Internal Medicine

## 2012-11-05 ENCOUNTER — Telehealth: Payer: Self-pay | Admitting: Internal Medicine

## 2012-11-05 NOTE — Telephone Encounter (Signed)
We have not contacted this patient. I have spoken with the pt to verify that she did not need anything.  Nothing further needed.

## 2012-11-09 ENCOUNTER — Ambulatory Visit (AMBULATORY_SURGERY_CENTER): Payer: 59 | Admitting: Gastroenterology

## 2012-11-09 ENCOUNTER — Encounter: Payer: Self-pay | Admitting: Gastroenterology

## 2012-11-09 VITALS — BP 112/69 | HR 66 | Temp 97.5°F | Resp 15 | Ht 65.0 in | Wt 176.0 lb

## 2012-11-09 DIAGNOSIS — D126 Benign neoplasm of colon, unspecified: Secondary | ICD-10-CM

## 2012-11-09 DIAGNOSIS — Z86010 Personal history of colonic polyps: Secondary | ICD-10-CM

## 2012-11-09 MED ORDER — SODIUM CHLORIDE 0.9 % IV SOLN: 500.0000 mL | INTRAVENOUS | Status: DC

## 2012-11-09 NOTE — Op Note (Signed)
[facility] [address] [place], [address]   COLONOSCOPY PROCEDURE REPORT  PATIENT: Caitlin, Wall  MR#: [id number] BIRTHDATE: [birth year] , 57  yrs. old GENDER: Female ENDOSCOPIST: Meryl Dare, MD, [facility] PROCEDURE DATE:  11/09/2012 PROCEDURE:   Colonoscopy, screening First Screening Colonoscopy - Avg.  risk and is 50 yrs.  old or older - No.  Prior Negative Screening - Now for repeat screening. N/A  History of Adenoma - Now for follow-up colonoscopy & has been > or = to 3 yrs.  Yes hx of adenoma.  Has been 3 or more years since last colonoscopy.  Polyps Removed Today? No.  Recommend repeat exam, <10 yrs? Yes.  High risk (family or personal hx). ASA CLASS:   Class II INDICATIONS:Patient's personal history of adenomatous colon polyps.  MEDICATIONS: MAC sedation, administered by CRNA and propofol (Diprivan) 300mg  IV DESCRIPTION OF PROCEDURE:   After the risks benefits and alternatives of the procedure were thoroughly explained, informed consent was obtained.  A digital rectal exam revealed no abnormalities of the rectum.   The LB [id number]  endoscope was introduced through the anus and advanced to the cecum, which was identified by both the appendix and ileocecal valve. No adverse events experienced.   The quality of the prep was Prepopik good The instrument was then slowly withdrawn as the colon was fully examined.  COLON FINDINGS: A sessile polyp measuring 4 mm in size was found at the cecum.  A polypectomy was performed with cold forceps.  The resection was complete and the polyp tissue was completely retrieved.   Mild diverticulosis was noted in the sigmoid colon. The colon was otherwise normal.  There was no diverticulosis, inflammation, polyps or cancers unless previously stated. Retroflexed views revealed no abnormalities. The time to cecum=4 minutes 22 seconds.  Withdrawal time=9 minutes 17 seconds.  The scope was withdrawn and the  procedure completed.  COMPLICATIONS: There were no complications.  ENDOSCOPIC IMPRESSION: 1.   Sessile polyp measuring 4 mm at the cecum; polypectomy performed with cold forceps 2.   Mild diverticulosis was noted in the sigmoid colon  RECOMMENDATIONS: 1.  Await pathology results 2.  High fiber diet with liberal fluid intake. 3.  Repeat Colonoscopy in 5 years.  eSigned:  Meryl Dare, MD, [facility] 11/09/2012 2:32 PM

## 2012-11-09 NOTE — Patient Instructions (Addendum)
YOU HAD AN ENDOSCOPIC PROCEDURE TODAY AT THE [facility]: Refer to the procedure report that was given to you for any specific questions about what was found during the examination.  If the procedure report does not answer your questions, please call your gastroenterologist to clarify.  If you requested that your care partner not be given the details of your procedure findings, then the procedure report has been included in a sealed envelope for you to review at your convenience later.  YOU SHOULD EXPECT: Some feelings of bloating in the abdomen. Passage of more gas than usual.  Walking can help get rid of the air that was put into your GI tract during the procedure and reduce the bloating. If you had a lower endoscopy (such as a colonoscopy or flexible sigmoidoscopy) you may notice spotting of blood in your stool or on the toilet paper. If you underwent a bowel prep for your procedure, then you may not have a normal bowel movement for a few days.  DIET: Your first meal following the procedure should be a light meal and then it is ok to progress to your normal diet.  A half-sandwich or bowl of soup is an example of a good first meal.  Heavy or fried foods are harder to digest and may make you feel nauseous or bloated.  Likewise meals heavy in dairy and vegetables can cause extra gas to form and this can also increase the bloating.  Drink plenty of fluids but you should avoid alcoholic beverages for 24 hours.  ACTIVITY: Your care partner should take you home directly after the procedure.  You should plan to take it easy, moving slowly for the rest of the day.  You can resume normal activity the day after the procedure however you should NOT DRIVE or use heavy machinery for 24 hours (because of the sedation medicines used during the test).    SYMPTOMS TO REPORT IMMEDIATELY: A gastroenterologist can be reached at any hour.  During normal business hours, 8:30 AM to 5:00 PM Monday through Friday,  call [phone].  After hours and on weekends, please call the GI answering service at [phone] who will take a message and have the physician on call contact you.   Following lower endoscopy (colonoscopy or flexible sigmoidoscopy):  Excessive amounts of blood in the stool  Significant tenderness or worsening of abdominal pains  Swelling of the abdomen that is new, acute  Fever of 100F or higher    FOLLOW UP: If any biopsies were taken you will be contacted by phone or by letter within the next 1-3 weeks.  Call your gastroenterologist if you have not heard about the biopsies in 3 weeks.  Our staff will call the home number listed on your records the next business day following your procedure to check on you and address any questions or concerns that you may have at that time regarding the information given to you following your procedure. This is a courtesy call and so if there is no answer at the home number and we have not heard from you through the emergency physician on call, we will assume that you have returned to your regular daily activities without incident.  SIGNATURES/CONFIDENTIALITY: You and/or your care partner have signed paperwork which will be entered into your electronic medical record.  These signatures attest to the fact that that the information above on your After Visit Summary has been reviewed and is understood.  Full responsibility of the confidentiality   of this discharge information lies with you and/or your care-partner.    Handouts were given to your care partner on diverticulosis, a high fiber diet and polyps. You may resume your current medications today. Please call if any questions or concerns.    

## 2012-11-09 NOTE — Progress Notes (Signed)
  [facility] Anesthesia Post-op Note  Patient: Caitlin Wall  Procedure(s) Performed: colonoscopy  Patient Location: [facility] - Recovery Area  Anesthesia Type: Deep Sedation/Propofol  Level of Consciousness: awake, oriented and patient cooperative  Airway and Oxygen Therapy: Patient Spontanous Breathing  Post-op Pain: none  Post-op Assessment:  Post-op Vital signs reviewed, Patient's Cardiovascular Status Stable, Respiratory Function Stable, Patent Airway, No signs of Nausea or vomiting and Pain level controlled  Post-op Vital Signs: Reviewed and stable  Complications: No apparent anesthesia complications  Hulon Ferron E 2:35 PM

## 2012-11-09 NOTE — Progress Notes (Signed)
The pt tolerated the recovery period without problems. Maw

## 2012-11-09 NOTE — Progress Notes (Signed)
Called to room to assist during endoscopic procedure.  Patient ID and intended procedure confirmed with present staff. Received instructions for my participation in the procedure from the performing physician.  

## 2012-11-10 ENCOUNTER — Telehealth: Payer: Self-pay

## 2012-11-10 NOTE — Telephone Encounter (Signed)
  Follow up Call-  Call back number 11/09/2012  Post procedure Call Back phone  # [phone]  Phone comments NO ANSWERING MACHINE  Permission to leave phone message No     Patient questions:  Do you have a fever, pain , or abdominal swelling? no Pain Score  0 *  Have you tolerated food without any problems? yes  Have you been able to return to your normal activities? yes  Do you have any questions about your discharge instructions: Diet   no Medications  no Follow up visit  no  Do you have questions or concerns about your Care? no  Actions: * If pain score is 4 or above: No action needed, pain <4. Sick yesterday but feeling better today per husband.

## 2012-11-11 ENCOUNTER — Telehealth: Payer: Self-pay | Admitting: Internal Medicine

## 2012-11-11 NOTE — Telephone Encounter (Signed)
Pt reports colonoscopy Monday with Dr. Russella Dar Reports nausea and vomiting after arriving home on Monday evening.  This resolved She has continued to have abd discomfort which has slowly improved, but still 6/10.   She is eating and drinking.  Had normal appearing BM today She reports fever Monday night, but none since Sleeping well despite pain  Plan: office to check on patient in the morning.  If not significantly better patient can be scheduled for urgent visit with either Dr. Russella Dar or an advanced practitioner Pt reminded if pain acutely worsens or she develop fever, vomiting, or bleeding to go to the ER immediately

## 2012-11-12 ENCOUNTER — Other Ambulatory Visit (INDEPENDENT_AMBULATORY_CARE_PROVIDER_SITE_OTHER): Payer: 59

## 2012-11-12 ENCOUNTER — Encounter: Payer: Self-pay | Admitting: Gastroenterology

## 2012-11-12 ENCOUNTER — Ambulatory Visit (INDEPENDENT_AMBULATORY_CARE_PROVIDER_SITE_OTHER)
Admission: RE | Admit: 2012-11-12 | Discharge: 2012-11-12 | Disposition: A | Discharge status: Home / Self Care | Payer: 59 | Source: Ambulatory Visit | Attending: Gastroenterology | Admitting: Gastroenterology

## 2012-11-12 ENCOUNTER — Ambulatory Visit (INDEPENDENT_AMBULATORY_CARE_PROVIDER_SITE_OTHER): Payer: 59 | Admitting: Gastroenterology

## 2012-11-12 VITALS — BP 102/74 | HR 80 | Ht 65.0 in | Wt 171.0 lb

## 2012-11-12 DIAGNOSIS — R109 Unspecified abdominal pain: Secondary | ICD-10-CM

## 2012-11-12 LAB — CBC WITH DIFFERENTIAL/PLATELET
Basophils Relative: 0.2 % (ref 0.0–3.0)
Eosinophils Absolute: 0.1 10*3/uL (ref 0.0–0.7)
HCT: 38.4 % (ref 36.0–46.0)
Hemoglobin: 13.1 g/dL (ref 12.0–15.0)
Lymphocytes Relative: 17.8 % (ref 12.0–46.0)
Lymphs Abs: 1.4 10*3/uL (ref 0.7–4.0)
MCHC: 34 g/dL (ref 30.0–36.0)
MCV: 90.3 fl (ref 78.0–100.0)
Neutro Abs: 5.8 10*3/uL (ref 1.4–7.7)
RBC: 4.25 Mil/uL (ref 3.87–5.11)

## 2012-11-12 MED ORDER — DICYCLOMINE HCL 10 MG PO CAPS: 10.0000 mg | ORAL_CAPSULE | Freq: Two times a day (BID) | ORAL | Status: DC

## 2012-11-12 NOTE — Telephone Encounter (Signed)
Agree with office evaluation today

## 2012-11-12 NOTE — Progress Notes (Signed)
11/12/2012 Caitlin Wall [id number] Jul 29, 1955   History of Present Illness:  Patient is a 57 year old female who is a patient of Dr. Ardell Isaacs.  She had a screening colonoscopy on 9/15 (3 days ago) by Dr. Russella Dar.  She had one polyp removed from the cecum and otherwise only had diverticulosis.  MAC sedation was used.  She says that when she got home she had nausea and vomiting later that night.  She has been experiencing persistent abdominal pain in her lower abdomen as well.  Says that it feels like a constant cramp but worsens with movement.  Has gotten somewhat better since Monday and Tuesday.  She is passing flatus and had a normal BM yesterday.  Has been eating and drinking the last couple of days without issues.  No fevers but felt like she had chills initially.  No chest pain or shortness of breath.  Says that she never had similar issues with colonoscopy procedures in the past.  Current Medications, Allergies, Past Medical History, Past Surgical History, Family History and Social History were reviewed in [employer] record.   Physical Exam: BP 102/74  Pulse 80  Ht 5\' 5"  (1.651 m)  Wt 171 lb (77.565 kg)  BMI 28.46 kg/m2 General: Well developed, white female in no acute distress Head: Normocephalic and atraumatic Eyes:  sclerae anicteric, conjunctiva pink  Ears: Normal auditory acuity Lungs: Clear throughout to auscultation Heart: Regular rate and rhythm Abdomen: Soft, non-distended. No masses, no hepatomegaly. Normal bowel sounds.  Mild diffuse TTP but abdomen benign. Musculoskeletal: Symmetrical with no gross deformities  Extremities: No edema  Neurological: Alert oriented x 4, grossly nonfocal Psychological:  Alert and cooperative. Normal mood and affect  Assessment and Recommendations: -Abdominal pain post colonoscopy:  Patient looks well and does not have any worrisome signs of procedural complication.  I suspect that she has an abdominal wall strain  as cause of her pain.  Will check CBC and 2 view abdominal x-ray as precautionary.  Will give bentyl 10 mg to take BID for the next couple of weeks for intestinal cramping/spasming.  Advised to use heating pad, 20 minutes on and 20 minutes off, a couple of times a day.  Can use NSAID's for the next few days until pain subsides further.

## 2012-11-12 NOTE — Telephone Encounter (Signed)
Patient reports continued abdominal tenderness She will come in and see Doug Sou, PA at 11:00

## 2012-11-12 NOTE — Progress Notes (Signed)
Reviewed and agree with management plan.  Cameka Rae T. Loretha Ure, MD FACG 

## 2012-11-12 NOTE — Patient Instructions (Addendum)
Your physician has requested that you go to the basement for the following lab work before leaving today:  CBC, as well as abdominal Xrays in the X Ray department.  We have sent the following medications to your pharmacy for you to pick up at your convenience:  Bentyl  Per Shanda Bumps, you may try using a heating pad and anti-inflammatory medications for the next few days

## 2012-11-16 ENCOUNTER — Encounter: Payer: Self-pay | Admitting: Gastroenterology

## 2012-11-17 ENCOUNTER — Encounter: Payer: Self-pay | Admitting: Gastroenterology

## 2012-12-07 ENCOUNTER — Inpatient Hospital Stay: Admission: RE | Admit: 2012-12-07 | Payer: 59 | Source: Ambulatory Visit

## 2013-01-27 ENCOUNTER — Ambulatory Visit: Payer: 59 | Admitting: Internal Medicine

## 2013-01-28 ENCOUNTER — Ambulatory Visit (INDEPENDENT_AMBULATORY_CARE_PROVIDER_SITE_OTHER): Payer: BC Managed Care – PPO | Admitting: Internal Medicine

## 2013-01-28 ENCOUNTER — Encounter: Payer: Self-pay | Admitting: Internal Medicine

## 2013-01-28 VITALS — BP 120/84 | HR 78 | Temp 98.3°F | Ht 65.0 in | Wt 175.1 lb

## 2013-01-28 DIAGNOSIS — Z0000 Encounter for general adult medical examination without abnormal findings: Secondary | ICD-10-CM

## 2013-01-28 DIAGNOSIS — J309 Allergic rhinitis, unspecified: Secondary | ICD-10-CM

## 2013-01-28 DIAGNOSIS — I10 Essential (primary) hypertension: Secondary | ICD-10-CM

## 2013-01-28 DIAGNOSIS — E785 Hyperlipidemia, unspecified: Secondary | ICD-10-CM

## 2013-01-28 DIAGNOSIS — Z23 Encounter for immunization: Secondary | ICD-10-CM

## 2013-01-28 DIAGNOSIS — J45909 Unspecified asthma, uncomplicated: Secondary | ICD-10-CM

## 2013-01-28 DIAGNOSIS — J45998 Other asthma: Secondary | ICD-10-CM

## 2013-01-28 MED ORDER — LOSARTAN POTASSIUM 50 MG PO TABS: 50.0000 mg | ORAL_TABLET | Freq: Every day | ORAL | Status: DC

## 2013-01-28 MED ORDER — FLUTICASONE-SALMETEROL 250-50 MCG/DOSE IN AEPB: 1.0000 | INHALATION_SPRAY | Freq: Two times a day (BID) | RESPIRATORY_TRACT | Status: DC

## 2013-01-28 NOTE — Progress Notes (Signed)
Pre-visit discussion using our clinic review tool. No additional management support is needed unless otherwise documented below in the visit note.  

## 2013-01-28 NOTE — Assessment & Plan Note (Signed)
Recent allergy testing neg, follow

## 2013-01-28 NOTE — Progress Notes (Signed)
Subjective:    Patient ID: Caitlin Wall, female    DOB: 12-11-1955, 57 y.o.   MRN: [id number]  HPI  Here to f/u; overall doing ok,  Pt denies chest pain, increased sob or doe, wheezing, orthopnea, PND, increased LE swelling, palpitations, dizziness or syncope.  Pt denies polydipsia, polyuria,   Pt denies new neurological symptoms such as new headache, or facial or extremity weakness or numbness.   Pt states overall good compliance with meds, has been trying to follow lower cholesterol, diabetic diet, with wt overall stable,  but little exercise however. Breathing overall stable unless exposed to someone's perfume.  Due for prevnar today.  Trying to follow lower chol diet Past Medical History  Diagnosis Date  . Hx of colonic polyps 1995    adenomatous  . GERD (gastroesophageal reflux disease)   . Hyperlipidemia   . Hypertension   . COLONIC POLYPS, HX OF 11/04/2007  . GERD 11/04/2007  . HYPERLIPIDEMIA 11/04/2007  . HYPERTENSION 06/07/2009  . HAIR LOSS 11/04/2007  . LBBB (left bundle branch block) 07/26/2011  . Asthma, persistent not controlled 07/27/2012   Past Surgical History  Procedure Laterality Date  . Bilat ear surgury/hearing loss    . Abdominal hysterectomy    . Anterior fusion cervical spine      reports that she quit smoking about 23 years ago. Her smoking use included Cigarettes. She has a 20 pack-year smoking history. She has never used smokeless tobacco. She reports that she does not drink alcohol or use illicit drugs. family history includes Breast cancer in her maternal grandmother and other; Lung cancer in her father. There is no history of Coronary artery disease, Colon cancer, Pancreatic cancer, Rectal cancer, or Stomach cancer. Allergies  Allergen Reactions  . Codeine     REACTION: nausea,   Current Outpatient Prescriptions on File Prior to Visit  Medication Sig Dispense Refill  . albuterol (PROVENTIL HFA;VENTOLIN HFA) 108 (90 BASE) MCG/ACT inhaler Inhale 2 puffs  into the lungs every 6 (six) hours as needed for wheezing.  1 Inhaler  11  . aspirin 81 MG tablet Take 81 mg by mouth daily.         No current facility-administered medications on file prior to visit.    Review of Systems  Constitutional: Negative for unexpected weight change, or unusual diaphoresis  HENT: Negative for tinnitus.   Eyes: Negative for photophobia and visual disturbance.  Respiratory: Negative for choking and stridor.   Gastrointestinal: Negative for vomiting and blood in stool.  Genitourinary: Negative for hematuria and decreased urine volume.  Musculoskeletal: Negative for acute joint swelling Skin: Negative for color change and wound.  Neurological: Negative for tremors and numbness other than noted  Psychiatric/Behavioral: Negative for decreased concentration or  hyperactivity.       Objective:   Physical Exam BP 120/84  Pulse 78  Temp(Src) 98.3 F (36.8 C) (Oral)  Ht 5\' 5"  (1.651 m)  Wt 175 lb 2 oz (79.436 kg)  BMI 29.14 kg/m2  SpO2 98% VS noted,  Constitutional: Pt appears well-developed and well-nourished.  HENT: Head: NCAT.  Right Ear: External ear normal.  Left Ear: External ear normal.  Eyes: Conjunctivae and EOM are normal. Pupils are equal, round, and reactive to light.  Neck: Normal range of motion. Neck supple.  Cardiovascular: Normal rate and regular rhythm.   Pulmonary/Chest: Effort normal and breath sounds decreased, no rales or wheezing Neurological: Pt is alert. Not confused  Skin: Skin is warm. No erythema.  Psychiatric: Pt behavior is normal. Thought content normal.        Assessment & Plan:

## 2013-01-28 NOTE — Addendum Note (Signed)
Addended by: Scharlene Gloss B on: 01/28/2013 11:13 AM   Modules accepted: Orders

## 2013-01-28 NOTE — Assessment & Plan Note (Signed)
For prevnar today, ow stable

## 2013-01-28 NOTE — Assessment & Plan Note (Signed)
stable overall by history and exam, recent data reviewed with pt, and pt to continue medical treatment as before,  to f/u any worsening symptoms or concerns Lab Results  Component Value Date   LDLCALC 107* 07/24/2012   For better diet, f/u next visit

## 2013-01-28 NOTE — Patient Instructions (Signed)
You had the Prevnar pneumonia shot Please continue all other medications as before, and refills have been done if requested. Please have the pharmacy call with any other refills you may need. Please continue your efforts at being more active, low cholesterol diet, and weight control. Please keep your appointments with your specialists as you have planned  Please remember to sign up for My Chart if you have not done so, as this will be important to you in the future with finding out test results, communicating by private email, and scheduling acute appointments online when needed.  Please return in 6 months, or sooner if needed, with Lab testing done 3-5 days before

## 2013-01-28 NOTE — Assessment & Plan Note (Signed)
stable overall by history and exam, recent data reviewed with pt, and pt to continue medical treatment as before,  to f/u any worsening symptoms or concerns BP Readings from Last 3 Encounters:  01/28/13 120/84  11/12/12 102/74  11/09/12 112/69

## 2013-01-29 ENCOUNTER — Ambulatory Visit (INDEPENDENT_AMBULATORY_CARE_PROVIDER_SITE_OTHER)
Admission: RE | Admit: 2013-01-29 | Discharge: 2013-01-29 | Disposition: A | Discharge status: Home / Self Care | Payer: 59 | Source: Ambulatory Visit | Attending: Internal Medicine | Admitting: Internal Medicine

## 2013-01-29 DIAGNOSIS — R911 Solitary pulmonary nodule: Secondary | ICD-10-CM

## 2013-02-01 ENCOUNTER — Ambulatory Visit (INDEPENDENT_AMBULATORY_CARE_PROVIDER_SITE_OTHER): Payer: 59 | Admitting: Internal Medicine

## 2013-02-01 ENCOUNTER — Encounter: Payer: Self-pay | Admitting: Internal Medicine

## 2013-02-01 VITALS — BP 120/78 | HR 70 | Ht 65.0 in | Wt 174.8 lb

## 2013-02-01 DIAGNOSIS — J45998 Other asthma: Secondary | ICD-10-CM

## 2013-02-01 DIAGNOSIS — J45909 Unspecified asthma, uncomplicated: Secondary | ICD-10-CM

## 2013-02-01 DIAGNOSIS — R9389 Abnormal findings on diagnostic imaging of other specified body structures: Secondary | ICD-10-CM

## 2013-02-01 DIAGNOSIS — M546 Pain in thoracic spine: Secondary | ICD-10-CM

## 2013-02-01 NOTE — Progress Notes (Signed)
Subjective:    Patient ID: Caitlin Wall, female    DOB: 07/11/1955, 57 y.o.   MRN: [id number]  HPI 08/31/12- 68 yoF former smoker referred courtesy of Dr [name] for asthma.  Husband here.  pt reports diff breathing w light activity x 5 months-- currently taking advair and albuterol which provides some improvement.Remote smoker with no hx of lung disease. An oil lamp burned smokey in the home 5 months ago. About then, she began noting unusual dyspnea at rest and with exertion. No cough or wheeze, chest pain or palpitation, edema or infection. Feels substernal tightness w/o radiation or clear exertion trigger. No hx anemia or cardiac event. No hx of significant allergy or asthma. Home- older house, crawl space, CA, indoor dogs, Gas heat. House sustained significant water damage years ago, but no obvious mold/ mildew now. Smoked one pack per day x20 years, stopping in 1991 CXR 07/27/12- IMPRESSION:  No evidence for acute cardiopulmonary abnormality.  Original Report Authenticated By: Norva Pavlov, M.D. PFT 08/11/12- very mild obstructive airways disease in small airways, with response to bronchodilator. Normal lung volumes and diffusion. FVC 3.59/104%, FEV1 2.70/100%, FEV1/FVC 0.75, FEF 25-75% 2.35/92% TLC 98%, DLCO 88%.  10/02/12- 41 yoF former smoker referred courtesy of Dr [name] for asthma.  Husband here. FOLLOWS FOR: Pt reports that breathing has improved but she has yet to reach her base line.  D-dimer at last ov had returned elevated. Evaluating for her  CC of dyspnea, we got CTa- abnormal as below.  Had had a bite on her finger treated at urgent care with doxycycline  And Keflex x2 weeks We called in Biaxin for 10 days on July 11 Still dyspnea on exertion, but at rest breathing is better and she is not coughing. Denies fever sweats or adenopathy. CT chest 09/04/12-  IMPRESSION:  1. Negative for pulmonary embolism.  2. Consolidation in the right upper lobe is likely infectious or   inflammatory. Short-term follow-up chest radiograph recommended to  ensure clearing.  3. 7mm right upper lobe nodule. Given risk factors for  bronchogenic carcinoma (smoking history), follow-up chest CT at 3-6  months is recommended. This recommendation follows the consensus  statement: Guidelines for Management of Small Pulmonary Nodules  Detected on CT Scans: A Statement from the [employer] as  published in Radiology 2005; 237:395-400.  Original Report Authenticated By: Tiburcio Pea  02/01/13- 57 yoF former smoker referred courtesy of Dr [name] for asthma, complicated by hx pneumonia.  Husband here. FOLLOWS FOR: continues to have SOB; review CT chest with patient in detail Feels well. He denies routine cough, sweats or chills. Does not cough with swallowing. No choking events in sleep to suggest reflux. Hoarseness comes and goes. Occasional minor postnasal drip treated rescue inhaler made her tearful. When short of breath she hurts in mid back, either with exertion or exposure to strong odors. Husband points out that this complaint of back pain is long-standing and stable, noted before her respiratory complaints. Continues Advair. CT chest 01/29/13 IMPRESSION:  1. Interval decrease in size of previously described right upper  lobe pulmonary nodule now measuring 4 mm.  2. Resolution of previously visualized right upper lobe  consolidative pulmonary opacities most compatible with resolved  infectious process. Additionally there is a new irregular area of  ground-glass and consolidative opacity within the right upper lobe  which may represent an additional infectious/ inflammatory process.  3. Given the above findings, a followup chest CT in 6 months is  recommended to ensure  complete resolution of the pulmonary nodule as  well as the adjacent consolidative process.  Electronically Signed  By: Annia Belt M.D.  On: 01/29/2013 17:54  ROS-see HPI Constitutional:   No-   weight  loss, night sweats, fevers, chills, fatigue, lassitude. HEENT:   No-  headaches, difficulty swallowing, tooth/dental problems, sore throat,       No-  sneezing, itching, ear ache, nasal congestion, post nasal drip,  CV:  No-   chest pain, orthopnea, PND, swelling in lower extremities, anasarca,   dizziness, palpitations Resp: + since  shortness of breath with exertion or at rest.              No-   productive cough,  No non-productive cough,  No- coughing up of blood.              No-   change in color of mucus.  No- wheezing.   Skin: No-   rash or lesions. GI:  No-   heartburn, indigestion, abdominal pain, nausea, vomiting, GU:  MS:  No-   joint pain or swelling.  + back pain Neuro-     nothing unusual Psych:  No- change in mood or affect. No depression or anxiety.  No memory loss.    Objective:   Physical Exam  General- Alert, Oriented, Affect-appropriate, Distress- none acute. Overweight. Skin- rash-none, lesions- none, excoriation- none Lymphadenopathy- none Head- atraumatic            Eyes- Gross vision intact, PERRLA, conjunctivae and secretions clear            Ears- +Hearing aid            Nose- Clear, no-Septal dev, mucus, polyps, erosion, perforation             Throat- Mallampati II , mucosa clear , drainage- none, tonsils- atrophic Neck- flexible , trachea midline, no stridor , thyroid nl, carotid no bruit Chest - symmetrical excursion , unlabored           Heart/CV- RRR , no murmur , no gallop  , no rub, nl s1 s2                           - JVD- none , edema- none, stasis changes- none, varices- none           Lung- + few crackles, wheeze- none, cough- none , dullness-none, rub- none           Chest wall-  Abd- tender-no, distended-no, bowel sounds-present, HSM- no Br/ Gen/ Rectal- Not done, not indicated Extrem- cyanosis- none, clubbing, none, atrophy- none, strength- nl Neuro- grossly intact to observation Assessment & Plan:

## 2013-02-01 NOTE — Patient Instructions (Signed)
Exercise to build stamina- any activity that you can enjoy and do regularly   Try sample Spiriva to try instead of Advair    Inhale one time daily    See if it helps shortness of breath and if your hoarseness gets better

## 2013-02-03 NOTE — Progress Notes (Signed)
Quick Note:  ATC patient-no answer and unable to leave a message; will try again later. ______

## 2013-02-15 ENCOUNTER — Telehealth: Payer: Self-pay | Admitting: Internal Medicine

## 2013-02-15 MED ORDER — TIOTROPIUM BROMIDE MONOHYDRATE 18 MCG IN CAPS: 18.0000 ug | ORAL_CAPSULE | Freq: Every day | RESPIRATORY_TRACT | Status: DC

## 2013-02-15 NOTE — Telephone Encounter (Signed)
At her last OV CY, changed Advair to Spiriva. She would like a rx be sent to her pharmacy for this. Rx has been sent in.

## 2013-02-17 NOTE — Progress Notes (Signed)
Quick Note:  LMTCB on Friday 02-19-13 ______

## 2013-02-19 ENCOUNTER — Telehealth: Payer: Self-pay | Admitting: Internal Medicine

## 2013-02-19 NOTE — Telephone Encounter (Signed)
Notes Recorded by Waymon Budge, MD on 01/29/2013 at 7:38 PM CT chest- looks like waxing and waning areas of nodular inflammation. We will continue to follow.  lmtcb x1

## 2013-02-22 DIAGNOSIS — M546 Pain in thoracic spine: Secondary | ICD-10-CM | POA: Insufficient documentation

## 2013-02-22 NOTE — Telephone Encounter (Signed)
I spoke with patient about results and she verbalized understanding and had no questions 

## 2013-02-22 NOTE — Assessment & Plan Note (Signed)
CT 01/2013 shows improvement right upper lobe nodularity but new groundglass density right upper lobe with recommended CT followup in 6 months.

## 2013-02-22 NOTE — Telephone Encounter (Signed)
Dr. Maple Hudson please clarify what you mean by "waxing and waning areas of nodular inflammation" so we may better explain results to the patient. Thanks. Carron Curie, CMA

## 2013-02-22 NOTE — Assessment & Plan Note (Signed)
doubt radiation from heart or abdomen. Suspect she is arching her back when she breathes heavily. The key is chronic complaint, not progressive and with onset before her current awareness of dyspnea and cough.

## 2013-02-22 NOTE — Assessment & Plan Note (Signed)
Advair may be causing some hoarseness. Plan-try Spiriva for comparison

## 2013-02-22 NOTE — Telephone Encounter (Signed)
Over comparison xrays, some areas get a little better, then a little worse, without any real progression of the small areas of inflammation.

## 2013-04-17 ENCOUNTER — Other Ambulatory Visit: Payer: Self-pay | Admitting: Internal Medicine

## 2013-04-30 ENCOUNTER — Telehealth: Payer: Self-pay | Admitting: Internal Medicine

## 2013-04-30 MED ORDER — FLUTICASONE FUROATE-VILANTEROL 100-25 MCG/INH IN AEPB: 1.0000 | INHALATION_SPRAY | Freq: Every day | RESPIRATORY_TRACT | Status: DC

## 2013-04-30 NOTE — Telephone Encounter (Signed)
Memory Dance is not on pt's current medication list. Spoke with her. At her last OV CY switch Advair to Spiriva. Spiriva is making the pt "swell up." In 08/2012 CY gave the pt a sample of Breo to try in place of Advair. She feels that this worked the best and would like to go back on this.  CY - please advise on prescription for Breo. Thanks.

## 2013-04-30 NOTE — Telephone Encounter (Signed)
Ok to d/c spiriva and Rx Breo ellipta, # 1, 1 puff and rinse mouth, once daily, if her insurance will cover. We won't do piror auth. She will need to use something covered by her insurance, or pay out of pocket.

## 2013-04-30 NOTE — Telephone Encounter (Signed)
Pt aware that Rx has been sent to pharmacy and to d/c spiriva.

## 2013-07-27 ENCOUNTER — Other Ambulatory Visit (INDEPENDENT_AMBULATORY_CARE_PROVIDER_SITE_OTHER): Payer: BC Managed Care – PPO

## 2013-07-27 DIAGNOSIS — Z0000 Encounter for general adult medical examination without abnormal findings: Secondary | ICD-10-CM

## 2013-07-27 LAB — HEPATIC FUNCTION PANEL
ALT: 18 U/L (ref 0–35)
AST: 23 U/L (ref 0–37)
Albumin: 3.9 g/dL (ref 3.5–5.2)
Alkaline Phosphatase: 87 U/L (ref 39–117)
BILIRUBIN TOTAL: 1.6 mg/dL — AB (ref 0.2–1.2)
Bilirubin, Direct: 0.2 mg/dL (ref 0.0–0.3)
Total Protein: 7.1 g/dL (ref 6.0–8.3)

## 2013-07-27 LAB — URINALYSIS, ROUTINE W REFLEX MICROSCOPIC
BILIRUBIN URINE: NEGATIVE
KETONES UR: NEGATIVE
LEUKOCYTES UA: NEGATIVE
Nitrite: NEGATIVE
Specific Gravity, Urine: 1.025 (ref 1.000–1.030)
Total Protein, Urine: NEGATIVE
UROBILINOGEN UA: 0.2 (ref 0.0–1.0)
Urine Glucose: NEGATIVE
pH: 6 (ref 5.0–8.0)

## 2013-07-27 LAB — CBC WITH DIFFERENTIAL/PLATELET
BASOS ABS: 0 10*3/uL (ref 0.0–0.1)
Basophils Relative: 0.3 % (ref 0.0–3.0)
EOS ABS: 0.2 10*3/uL (ref 0.0–0.7)
Eosinophils Relative: 2.2 % (ref 0.0–5.0)
HEMATOCRIT: 41.6 % (ref 36.0–46.0)
Hemoglobin: 14.2 g/dL (ref 12.0–15.0)
LYMPHS ABS: 2.4 10*3/uL (ref 0.7–4.0)
Lymphocytes Relative: 28.3 % (ref 12.0–46.0)
MCHC: 34.1 g/dL (ref 30.0–36.0)
MCV: 88.2 fl (ref 78.0–100.0)
MONO ABS: 0.7 10*3/uL (ref 0.1–1.0)
Monocytes Relative: 8.9 % (ref 3.0–12.0)
Neutro Abs: 5 10*3/uL (ref 1.4–7.7)
Neutrophils Relative %: 60.3 % (ref 43.0–77.0)
PLATELETS: 209 10*3/uL (ref 150.0–400.0)
RBC: 4.71 Mil/uL (ref 3.87–5.11)
RDW: 13.1 % (ref 11.5–15.5)
WBC: 8.4 10*3/uL (ref 4.0–10.5)

## 2013-07-27 LAB — BASIC METABOLIC PANEL
BUN: 13 mg/dL (ref 6–23)
CHLORIDE: 105 meq/L (ref 96–112)
CO2: 30 mEq/L (ref 19–32)
Calcium: 9.9 mg/dL (ref 8.4–10.5)
Creatinine, Ser: 0.7 mg/dL (ref 0.4–1.2)
GFR: 88.93 mL/min (ref >60.00)
Glucose, Bld: 88 mg/dL (ref 70–99)
POTASSIUM: 4 meq/L (ref 3.5–5.1)
Sodium: 141 mEq/L (ref 135–145)

## 2013-07-27 LAB — LIPID PANEL
Cholesterol: 159 mg/dL (ref 0–200)
HDL: 43.4 mg/dL (ref >39.00)
LDL Cholesterol: 99 mg/dL (ref 0–99)
NONHDL: 115.6
Total CHOL/HDL Ratio: 4
Triglycerides: 81 mg/dL (ref 0.0–149.0)
VLDL: 16.2 mg/dL (ref 0.0–40.0)

## 2013-07-27 LAB — TSH: TSH: 0.91 u[IU]/mL (ref 0.35–4.50)

## 2013-07-29 ENCOUNTER — Encounter: Payer: Self-pay | Admitting: Internal Medicine

## 2013-07-29 ENCOUNTER — Ambulatory Visit (INDEPENDENT_AMBULATORY_CARE_PROVIDER_SITE_OTHER): Payer: BC Managed Care – PPO | Admitting: Internal Medicine

## 2013-07-29 VITALS — BP 132/90 | HR 85 | Temp 98.2°F | Ht 65.0 in | Wt 182.5 lb

## 2013-07-29 DIAGNOSIS — Z0000 Encounter for general adult medical examination without abnormal findings: Secondary | ICD-10-CM

## 2013-07-29 MED ORDER — LOSARTAN POTASSIUM 50 MG PO TABS: 50.0000 mg | ORAL_TABLET | Freq: Every day | ORAL | Status: DC

## 2013-07-29 NOTE — Patient Instructions (Addendum)
Your EKG was OK today  Please continue all other medications as before, and refills have been done if requested. Please have the pharmacy call with any other refills you may need.  Please continue your efforts at being more active, low cholesterol diet, and weight control.  You are otherwise up to date with prevention measures today.  You will be contacted regarding the referral for: mammogram  Please keep your appointments with your specialists as you have planned  - Dr Annamaria Boots  Please call if you would like the dermatology referral  Please return in 1 year for your yearly visit, or sooner if needed, with Lab testing done 3-5 days before

## 2013-07-29 NOTE — Progress Notes (Signed)
Subjective:    Patient ID: Caitlin Wall, female    DOB: Nov 06, 1955, 58 y.o.   MRN: [id number]  HPI   Here for wellness and f/u;  Overall doing ok;  Pt denies CP, worsening SOB, DOE, wheezing, orthopnea, PND, worsening LE edema, palpitations, dizziness or syncope.  Pt denies neurological change such as new headache, facial or extremity weakness.  Pt denies polydipsia, polyuria, or low sugar symptoms. Pt states overall good compliance with treatment and medications, good tolerability, and has been trying to follow lower cholesterol diet.  Pt denies worsening depressive symptoms, suicidal ideation or panic. No fever, night sweats, wt loss, loss of appetite, or other constitutional symptoms.  Pt states good ability with ADL's, has low fall risk, home safety reviewed and adequate, no other significant changes in hearing or vision, and only occasionally active with exercise.  No current complaints. Has a "hard" spot to the left side of nose, but ongoing for months, no change, declines derm referral Past Medical History  Diagnosis Date  . Hx of colonic polyps 1995    adenomatous  . GERD (gastroesophageal reflux disease)   . Hyperlipidemia   . Hypertension   . COLONIC POLYPS, HX OF 11/04/2007  . GERD 11/04/2007  . HYPERLIPIDEMIA 11/04/2007  . HYPERTENSION 06/07/2009  . HAIR LOSS 11/04/2007  . LBBB (left bundle branch block) 07/26/2011  . Asthma, persistent not controlled 07/27/2012   Past Surgical History  Procedure Laterality Date  . Bilat ear surgury/hearing loss    . Abdominal hysterectomy    . Anterior fusion cervical spine      reports that she quit smoking about 23 years ago. Her smoking use included Cigarettes. She has a 20 pack-year smoking history. She has never used smokeless tobacco. She reports that she does not drink alcohol or use illicit drugs. family history includes Breast cancer in her maternal grandmother and other; Lung cancer in her father. There is no history of Coronary artery  disease, Colon cancer, Pancreatic cancer, Rectal cancer, or Stomach cancer. Allergies  Allergen Reactions  . Codeine     REACTION: nausea,   Current Outpatient Prescriptions on File Prior to Visit  Medication Sig Dispense Refill  . albuterol (PROVENTIL HFA;VENTOLIN HFA) 108 (90 BASE) MCG/ACT inhaler Inhale 2 puffs into the lungs every 6 (six) hours as needed for wheezing.  1 Inhaler  11  . aspirin 81 MG tablet Take 81 mg by mouth daily.         No current facility-administered medications on file prior to visit.   Review of Systems Constitutional: Negative for increased diaphoresis, other activity, appetite or other siginficant weight change  HENT: Negative for worsening hearing loss, ear pain, facial swelling, mouth sores and neck stiffness.   Eyes: Negative for other worsening pain, redness or visual disturbance.  Respiratory: Negative for shortness of breath and wheezing.   Cardiovascular: Negative for chest pain and palpitations.  Gastrointestinal: Negative for diarrhea, blood in stool, abdominal distention or other pain Genitourinary: Negative for hematuria, flank pain or change in urine volume.  Musculoskeletal: Negative for myalgias or other joint complaints.  Skin: Negative for color change and wound.  Neurological: Negative for syncope and numbness. other than noted Hematological: Negative for adenopathy. or other swelling Psychiatric/Behavioral: Negative for hallucinations, self-injury, decreased concentration or other worsening agitation.      Objective:   Physical Exam BP 132/90  Pulse 85  Temp(Src) 98.2 F (36.8 C) (Oral)  Ht 5\' 5"  (1.651 m)  Wt 182 lb  8 oz (82.781 kg)  BMI 30.37 kg/m2  SpO2 97% VS noted,  Constitutional: Pt is oriented to person, place, and time. Appears well-developed and well-nourished.  Head: Normocephalic and atraumatic.  Right Ear: External ear normal.  Left Ear: External ear normal.  Nose: Nose normal.  Mouth/Throat: Oropharynx is clear  and moist.  Eyes: Conjunctivae and EOM are normal. Pupils are equal, round, and reactive to light.  Neck: Normal range of motion. Neck supple. No JVD present. No tracheal deviation present.  Cardiovascular: Normal rate, regular rhythm, normal heart sounds and intact distal pulses.   Pulmonary/Chest: Effort normal and breath sounds without rales or wheezing  Abdominal: Soft. Bowel sounds are normal. NT. No HSM  Musculoskeletal: Normal range of motion. Exhibits no edema.  Lymphadenopathy:  Has no cervical adenopathy.  Neurological: Pt is alert and oriented to person, place, and time. Pt has normal reflexes. No cranial nerve deficit. Motor grossly intact Skin: Skin is warm and dry. No rash noted.  Psychiatric:  Has normal mood and affect. Behavior is normal.     Assessment & Plan:

## 2013-07-29 NOTE — Assessment & Plan Note (Signed)

## 2013-08-02 ENCOUNTER — Encounter: Payer: Self-pay | Admitting: Internal Medicine

## 2013-08-02 ENCOUNTER — Ambulatory Visit (INDEPENDENT_AMBULATORY_CARE_PROVIDER_SITE_OTHER): Payer: BC Managed Care – PPO | Admitting: Internal Medicine

## 2013-08-02 VITALS — BP 148/98 | HR 76 | Ht 65.0 in | Wt 185.2 lb

## 2013-08-02 DIAGNOSIS — J4520 Mild intermittent asthma, uncomplicated: Secondary | ICD-10-CM

## 2013-08-02 DIAGNOSIS — J45909 Unspecified asthma, uncomplicated: Secondary | ICD-10-CM

## 2013-08-02 DIAGNOSIS — R911 Solitary pulmonary nodule: Secondary | ICD-10-CM

## 2013-08-02 NOTE — Progress Notes (Signed)
Subjective:    Patient ID: Caitlin Wall, female    DOB: 07/25/1955, 58 y.o.   MRN: [id number]  HPI 08/31/12- 67 yoF former smoker referred courtesy of Dr Jenny Reichmann for asthma.  Husband here.  pt reports diff breathing w light activity x 5 months-- currently taking advair and albuterol which provides some improvement.Remote smoker with no hx of lung disease. An oil lamp burned smokey in the home 5 months ago. About then, she began noting unusual dyspnea at rest and with exertion. No cough or wheeze, chest pain or palpitation, edema or infection. Feels substernal tightness w/o radiation or clear exertion trigger. No hx anemia or cardiac event. No hx of significant allergy or asthma. Home- older house, crawl space, [place], indoor dogs, Gas heat. House sustained significant water damage years ago, but no obvious mold/ mildew now. Smoked one pack per day x20 years, stopping in 1991 CXR 07/27/12- IMPRESSION:  No evidence for acute cardiopulmonary abnormality.  Original Report Authenticated By: Nolon Nations, M.D. PFT 08/11/12- very mild obstructive airways disease in small airways, with response to bronchodilator. Normal lung volumes and diffusion. FVC 3.59/104%, FEV1 2.70/100%, FEV1/FVC 0.75, FEF 25-75% 2.35/92% TLC 98%, DLCO 88%.  10/02/12- 65 yoF former smoker referred courtesy of Dr Jenny Reichmann for asthma.  Husband here. FOLLOWS FOR: Pt reports that breathing has improved but she has yet to reach her base line.  D-dimer at last ov had returned elevated. Evaluating for her  CC of dyspnea, we got CTa- abnormal as below.  Had had a bite on her finger treated at urgent care with doxycycline  And Keflex x2 weeks We called in Biaxin for 10 days on July 11 Still dyspnea on exertion, but at rest breathing is better and she is not coughing. Denies fever sweats or adenopathy. CT chest 09/04/12-  IMPRESSION:  1. Negative for pulmonary embolism.  2. Consolidation in the right upper lobe is likely infectious or   inflammatory. Short-term follow-up chest radiograph recommended to  ensure clearing.  3. 17mm right upper lobe nodule. Given risk factors for  bronchogenic carcinoma (smoking history), follow-up chest CT at 3-6  months is recommended. This recommendation follows the consensus  statement: Guidelines for Management of Small Pulmonary Nodules  Detected on CT Scans: A Statement from the [employer] as  published in Radiology 2005; 237:395-400.  Original Report Authenticated By: Jorje Guild  02/01/13- 57 yoF former smoker referred courtesy of Dr Jenny Reichmann for asthma, complicated by hx pneumonia.  Husband here. FOLLOWS FOR: continues to have SOB; review CT chest with patient in detail Feels well. She denies routine cough, sweats or chills. Does not cough with swallowing. No choking events in sleep to suggest reflux. Hoarseness comes and goes. Occasional minor postnasal drip treated rescue inhaler made her tearful. When short of breath she hurts in mid back, either with exertion or exposure to strong odors. Husband points out that this complaint of back pain is long-standing and stable, noted before her respiratory complaints. Continues Advair. CT chest 01/29/13 IMPRESSION:  1. Interval decrease in size of previously described right upper  lobe pulmonary nodule now measuring 4 mm.  2. Resolution of previously visualized right upper lobe  consolidative pulmonary opacities most compatible with resolved  infectious process. Additionally there is a new irregular area of  ground-glass and consolidative opacity within the right upper lobe  which may represent an additional infectious/ inflammatory process.  3. Given the above findings, a followup chest CT in 6 months is  recommended to ensure  complete resolution of the pulmonary nodule as  well as the adjacent consolidative process.  Electronically Signed  By: Lovey Newcomer M.D.  On: 01/29/2013 17:54  08/02/13- 73 yoF former smoker referred courtesy  of Dr Jenny Reichmann for asthma, hx lung nodules complicated by hx pneumonia.  Husband here. FOLLOWS FOR: Pt states with moderate activity she becomes SOB. C/o mild mild cough with intermittent clear mucous production. Denies CP/tightness.  Still occasional shortness of breath with exertion affected by weather. Scant dry cough. No postnasal drip or reflux. this ROS-see HPI Constitutional:   No-   weight loss, night sweats, fevers, chills, fatigue, lassitude. HEENT:   No-  headaches, difficulty swallowing, tooth/dental problems, sore throat,       No-  sneezing, itching, ear ache, nasal congestion, post nasal drip,  CV:  No-   chest pain, orthopnea, PND, swelling in lower extremities, anasarca,   dizziness, palpitations Resp: +  shortness of breath with exertion or at rest.              No-   productive cough,  + non-productive cough,  No- coughing up of blood.              No-   change in color of mucus.  No- wheezing.   Skin: No-   rash or lesions. GI:  No-   heartburn, indigestion, abdominal pain, nausea, vomiting, GU:  MS:  No-   joint pain or swelling.  + back pain Neuro-     nothing unusual Psych:  No- change in mood or affect. No depression or anxiety.  No memory loss.    Objective:   Physical Exam  General- Alert, Oriented, Affect-appropriate, Distress- none acute. Overweight. Skin- rash-none, lesions- none, excoriation- none Lymphadenopathy- none Head- atraumatic            Eyes- Gross vision intact, PERRLA, conjunctivae and secretions clear            Ears- +Hearing aid            Nose- Clear, no-Septal dev, mucus, polyps, erosion, perforation             Throat- Mallampati II , mucosa clear , drainage- none, tonsils- atrophic Neck- flexible , trachea midline, no stridor , thyroid nl, carotid no bruit Chest - symmetrical excursion , unlabored           Heart/CV- RRR , no murmur , no gallop  , no rub, nl s1 s2                           - JVD- none , edema- none, stasis changes- none,  varices- none           Lung- clear, wheeze- none, cough- none , dullness-none, rub- none           Chest wall-  Abd-  Br/ Gen/ Rectal- Not done, not indicated Extrem- cyanosis- none, clubbing, none, atrophy- none, strength- nl Neuro- grossly intact to observation Assessment & Plan:

## 2013-08-02 NOTE — Patient Instructions (Signed)
Order- CT chest non contrast    Dx lung nodules  Please call as needed

## 2013-08-04 ENCOUNTER — Ambulatory Visit (INDEPENDENT_AMBULATORY_CARE_PROVIDER_SITE_OTHER)
Admission: RE | Admit: 2013-08-04 | Discharge: 2013-08-04 | Disposition: A | Discharge status: Home / Self Care | Payer: BC Managed Care – PPO | Source: Ambulatory Visit | Attending: Internal Medicine | Admitting: Internal Medicine

## 2013-08-04 DIAGNOSIS — R911 Solitary pulmonary nodule: Secondary | ICD-10-CM

## 2013-08-10 ENCOUNTER — Other Ambulatory Visit: Payer: Self-pay | Admitting: Internal Medicine

## 2013-08-10 DIAGNOSIS — J189 Pneumonia, unspecified organism: Secondary | ICD-10-CM

## 2013-08-11 ENCOUNTER — Other Ambulatory Visit: Payer: BC Managed Care – PPO

## 2013-08-13 ENCOUNTER — Other Ambulatory Visit: Payer: BC Managed Care – PPO

## 2013-08-13 DIAGNOSIS — J189 Pneumonia, unspecified organism: Secondary | ICD-10-CM

## 2013-08-16 LAB — RESPIRATORY CULTURE OR RESPIRATORY AND SPUTUM CULTURE
GRAM STAIN: NONE SEEN
Gram Stain: NONE SEEN
ORGANISM ID, BACTERIA: NORMAL

## 2013-08-23 ENCOUNTER — Other Ambulatory Visit: Payer: Self-pay | Admitting: Internal Medicine

## 2013-08-23 ENCOUNTER — Ambulatory Visit
Admission: RE | Admit: 2013-08-23 | Discharge: 2013-08-23 | Disposition: A | Discharge status: Home / Self Care | Payer: BC Managed Care – PPO | Source: Ambulatory Visit | Attending: Internal Medicine | Admitting: Internal Medicine

## 2013-08-23 DIAGNOSIS — Z0000 Encounter for general adult medical examination without abnormal findings: Secondary | ICD-10-CM

## 2013-08-24 ENCOUNTER — Telehealth: Payer: Self-pay | Admitting: Internal Medicine

## 2013-08-24 NOTE — Telephone Encounter (Signed)
Pt calling requesting sputum results There are some Prelim results in chart Pt aware that we will contact her as soon as these final results are received and reviewed. Please advise Dr Annamaria Boots, thanks!

## 2013-08-25 NOTE — Telephone Encounter (Signed)
Results have been explained to patient, pt expressed understanding. Nothing further needed.  

## 2013-08-25 NOTE — Telephone Encounter (Signed)
Routine sputum culture for common bacteria just shows "normal flora" that are usually in our airways. Initial smears are negative for TB and its relatives, but that final report doesn't come out for 6 weeks.

## 2013-09-02 ENCOUNTER — Ambulatory Visit (INDEPENDENT_AMBULATORY_CARE_PROVIDER_SITE_OTHER): Payer: BC Managed Care – PPO | Admitting: Family Medicine

## 2013-09-02 ENCOUNTER — Ambulatory Visit (INDEPENDENT_AMBULATORY_CARE_PROVIDER_SITE_OTHER): Payer: BC Managed Care – PPO

## 2013-09-02 VITALS — BP 142/80 | HR 91 | Temp 98.0°F | Resp 18 | Ht 64.0 in | Wt 182.3 lb

## 2013-09-02 DIAGNOSIS — M542 Cervicalgia: Secondary | ICD-10-CM

## 2013-09-02 DIAGNOSIS — S139XXA Sprain of joints and ligaments of unspecified parts of neck, initial encounter: Secondary | ICD-10-CM

## 2013-09-02 DIAGNOSIS — S161XXA Strain of muscle, fascia and tendon at neck level, initial encounter: Secondary | ICD-10-CM

## 2013-09-02 MED ORDER — CYCLOBENZAPRINE HCL 10 MG PO TABS: 10.0000 mg | ORAL_TABLET | Freq: Two times a day (BID) | ORAL | Status: DC | PRN

## 2013-09-02 NOTE — Patient Instructions (Signed)
I will be in touch after the radiologist reads your films.  You can use flexeril as needed for muscle pain- take 1/2 or 1 tablet twice a day as needed.  If you have any other concerns please let me know. You will be more sore tomorrow-this is to be expected.
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
## 2013-09-25 LAB — AFB CULTURE WITH SMEAR (NOT AT ARMC): Acid Fast Smear: NONE SEEN

## 2013-10-01 NOTE — Assessment & Plan Note (Addendum)
Dyspnea on exertion probably reflects combination of mild intermittent asthma and deconditioning. Fortunately little fixed obstruction although she smoked for 20 years. Plan-continue Advair

## 2013-10-01 NOTE — Assessment & Plan Note (Addendum)
Previous set of nodules resolved sister with inflammatory process. Groundglass on most recent chest CT is for followup as planned

## 2014-02-07 ENCOUNTER — Other Ambulatory Visit: Payer: BC Managed Care – PPO

## 2014-02-07 ENCOUNTER — Encounter: Payer: Self-pay | Admitting: Internal Medicine

## 2014-02-07 ENCOUNTER — Ambulatory Visit (INDEPENDENT_AMBULATORY_CARE_PROVIDER_SITE_OTHER): Payer: BC Managed Care – PPO | Admitting: Internal Medicine

## 2014-02-07 VITALS — BP 118/70 | HR 72 | Ht 65.0 in | Wt 185.0 lb

## 2014-02-07 DIAGNOSIS — J438 Other emphysema: Secondary | ICD-10-CM

## 2014-02-07 DIAGNOSIS — Z23 Encounter for immunization: Secondary | ICD-10-CM

## 2014-02-07 DIAGNOSIS — R918 Other nonspecific abnormal finding of lung field: Secondary | ICD-10-CM

## 2014-02-07 DIAGNOSIS — J4520 Mild intermittent asthma, uncomplicated: Secondary | ICD-10-CM

## 2014-02-07 NOTE — Patient Instructions (Signed)
Flu vax  Order- lab-   Alpha 1 antitrypsin assay   Dx emphysema  Ok to try off and on Breo, a week or two at a time, to see if you think it is making a difference in your breathing.  Sample Incruse Ellipta inhaler   This is a different kind of medicine. After you have satisfied yourself about whether to stay on Breo or stop,  Then try Incruse inhaler 1 puff, once daily. Use it up if you can, before deciding if it helps.
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
## 2014-02-08 MED ORDER — UMECLIDINIUM BROMIDE 62.5 MCG/INH IN AEPB: 1.0000 | INHALATION_SPRAY | Freq: Every day | RESPIRATORY_TRACT | Status: DC

## 2014-02-12 LAB — ALPHA-1 ANTITRYPSIN PHENOTYPE: A-1 Antitrypsin: 157 mg/dL (ref 83–199)

## 2014-02-12 NOTE — Assessment & Plan Note (Signed)
She thinks Breo Ellipta is causing bruising Plan-try sample Incruse inhaler, flu shot, alpha-1 antitrypsin assay

## 2014-02-12 NOTE — Assessment & Plan Note (Signed)
We will continue to follow lung nodules, planning CT scan in one year, earlier imaging if needed.

## 2014-02-16 ENCOUNTER — Telehealth: Payer: Self-pay | Admitting: Internal Medicine

## 2014-02-16 NOTE — Telephone Encounter (Signed)
Notes Recorded by Glean Hess, CMA on 02/15/2014 at 5:01 PM Lmtcb. 12/22 Notes Recorded by Deneise Lever, MD on 02/12/2014 at 7:38 PM Normal without unusual genetic risk for emphysema.   Called and spoke to pt. Informed pt of the results per CY. Pt verbalized understanding and denied any further questions or concerns at this time.

## 2014-02-16 NOTE — Progress Notes (Signed)
Quick Note:  Called and spoke to pt. Informed pt of the results per CY. Pt verbalized understanding and denied any further questions or concerns at this time. ______ 

## 2014-02-25 HISTORY — PX: BREAST LUMPECTOMY: SHX2

## 2014-05-04 ENCOUNTER — Telehealth: Payer: Self-pay | Admitting: Internal Medicine

## 2014-05-04 MED ORDER — UMECLIDINIUM BROMIDE 62.5 MCG/INH IN AEPB: 1.0000 | INHALATION_SPRAY | Freq: Every day | RESPIRATORY_TRACT | Status: DC

## 2014-05-04 NOTE — Telephone Encounter (Signed)
Pt needs incruse sent to pharmacy.  Was given samples at last visit.  This has been sent in.  Nothing further needed.

## 2014-06-09 ENCOUNTER — Ambulatory Visit (INDEPENDENT_AMBULATORY_CARE_PROVIDER_SITE_OTHER): Payer: BLUE CROSS/BLUE SHIELD | Admitting: Internal Medicine

## 2014-06-09 ENCOUNTER — Encounter: Payer: Self-pay | Admitting: Internal Medicine

## 2014-06-09 VITALS — BP 124/82 | HR 77 | Ht 65.0 in | Wt 194.6 lb

## 2014-06-09 DIAGNOSIS — R918 Other nonspecific abnormal finding of lung field: Secondary | ICD-10-CM

## 2014-06-09 DIAGNOSIS — J4520 Mild intermittent asthma, uncomplicated: Secondary | ICD-10-CM | POA: Diagnosis not present

## 2014-06-09 NOTE — Patient Instructions (Signed)
Ok to try off the Incruse to see if it is making a difference now. If you breathe better with it, then fine to continue. Otherwise fine to stay off it and see what happens.  We can decide about updating xrays next visit.
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
## 2014-06-11 NOTE — Assessment & Plan Note (Signed)
She has felt very stable and not needing rescue inhaler. Plan- okay to see how she does off Incruse inhaler. She will restart if she needs it.

## 2014-06-11 NOTE — Assessment & Plan Note (Signed)
Consistent with old inflammatory nodules. Plan-occasional imaging for long-term surveillance because of smoking history

## 2014-08-04 ENCOUNTER — Encounter: Payer: BC Managed Care – PPO | Admitting: Internal Medicine

## 2014-09-13 ENCOUNTER — Other Ambulatory Visit: Payer: Self-pay | Admitting: Internal Medicine

## 2014-10-07 ENCOUNTER — Ambulatory Visit (INDEPENDENT_AMBULATORY_CARE_PROVIDER_SITE_OTHER): Payer: BLUE CROSS/BLUE SHIELD | Admitting: Internal Medicine

## 2014-10-07 ENCOUNTER — Encounter: Payer: Self-pay | Admitting: Internal Medicine

## 2014-10-07 ENCOUNTER — Other Ambulatory Visit (INDEPENDENT_AMBULATORY_CARE_PROVIDER_SITE_OTHER): Payer: BLUE CROSS/BLUE SHIELD

## 2014-10-07 VITALS — BP 134/90 | HR 83 | Temp 98.0°F | Ht 65.0 in | Wt 199.0 lb

## 2014-10-07 DIAGNOSIS — Z0000 Encounter for general adult medical examination without abnormal findings: Secondary | ICD-10-CM | POA: Diagnosis not present

## 2014-10-07 DIAGNOSIS — Z23 Encounter for immunization: Secondary | ICD-10-CM

## 2014-10-07 DIAGNOSIS — I10 Essential (primary) hypertension: Secondary | ICD-10-CM

## 2014-10-07 LAB — BASIC METABOLIC PANEL
BUN: 12 mg/dL (ref 6–23)
CALCIUM: 9.5 mg/dL (ref 8.4–10.5)
CO2: 29 mEq/L (ref 19–32)
Chloride: 105 mEq/L (ref 96–112)
Creatinine, Ser: 0.82 mg/dL (ref 0.40–1.20)
GFR: 76.21 mL/min (ref >60.00)
Glucose, Bld: 99 mg/dL (ref 70–99)
POTASSIUM: 3.9 meq/L (ref 3.5–5.1)
Sodium: 141 mEq/L (ref 135–145)

## 2014-10-07 LAB — CBC WITH DIFFERENTIAL/PLATELET
BASOS ABS: 0 10*3/uL (ref 0.0–0.1)
Basophils Relative: 0.6 % (ref 0.0–3.0)
EOS PCT: 1.4 % (ref 0.0–5.0)
Eosinophils Absolute: 0.1 10*3/uL (ref 0.0–0.7)
HEMATOCRIT: 42.2 % (ref 36.0–46.0)
Hemoglobin: 14.4 g/dL (ref 12.0–15.0)
Lymphocytes Relative: 26.2 % (ref 12.0–46.0)
Lymphs Abs: 2 10*3/uL (ref 0.7–4.0)
MCHC: 34.3 g/dL (ref 30.0–36.0)
MCV: 89.1 fl (ref 78.0–100.0)
Monocytes Absolute: 0.8 10*3/uL (ref 0.1–1.0)
Monocytes Relative: 10 % (ref 3.0–12.0)
NEUTROS ABS: 4.8 10*3/uL (ref 1.4–7.7)
Neutrophils Relative %: 61.8 % (ref 43.0–77.0)
PLATELETS: 214 10*3/uL (ref 150.0–400.0)
RBC: 4.73 Mil/uL (ref 3.87–5.11)
RDW: 13.1 % (ref 11.5–15.5)
WBC: 7.8 10*3/uL (ref 4.0–10.5)

## 2014-10-07 LAB — HEPATIC FUNCTION PANEL
ALBUMIN: 3.9 g/dL (ref 3.5–5.2)
ALT: 20 U/L (ref 0–35)
AST: 23 U/L (ref 0–37)
Alkaline Phosphatase: 122 U/L — ABNORMAL HIGH (ref 39–117)
Bilirubin, Direct: 0.2 mg/dL (ref 0.0–0.3)
Total Bilirubin: 1 mg/dL (ref 0.2–1.2)
Total Protein: 6.9 g/dL (ref 6.0–8.3)

## 2014-10-07 LAB — URINALYSIS, ROUTINE W REFLEX MICROSCOPIC
Bilirubin Urine: NEGATIVE
Hgb urine dipstick: NEGATIVE
KETONES UR: NEGATIVE
LEUKOCYTES UA: NEGATIVE
Nitrite: NEGATIVE
PH: 7.5 (ref 5.0–8.0)
SPECIFIC GRAVITY, URINE: 1.015 (ref 1.000–1.030)
TOTAL PROTEIN, URINE-UPE24: NEGATIVE
Urine Glucose: NEGATIVE
Urobilinogen, UA: 1 (ref 0.0–1.0)

## 2014-10-07 LAB — TSH: TSH: 1.36 u[IU]/mL (ref 0.35–4.50)

## 2014-10-07 LAB — LIPID PANEL
CHOLESTEROL: 170 mg/dL (ref 0–200)
HDL: 39.5 mg/dL (ref >39.00)
LDL CALC: 114 mg/dL — AB (ref 0–99)
NonHDL: 130.79
TRIGLYCERIDES: 83 mg/dL (ref 0.0–149.0)
Total CHOL/HDL Ratio: 4
VLDL: 16.6 mg/dL (ref 0.0–40.0)

## 2014-10-07 MED ORDER — LOSARTAN POTASSIUM 50 MG PO TABS: 50.0000 mg | ORAL_TABLET | Freq: Every day | ORAL | Status: DC

## 2014-10-07 NOTE — Patient Instructions (Signed)
You had the Pneumovax pneumonia shot (and you would be due for the next one in 5 yrs)  Please continue all other medications as before, and refills have been done if requested.  Please have the pharmacy call with any other refills you may need.  Please continue your efforts at being more active, low cholesterol diet, and weight control.  You are otherwise up to date with prevention measures today.  Please keep your appointments with your specialists as you may have planned  Please go to the LAB in the Basement (turn left off the elevator) for the tests to be done today  You will be contacted by phone if any changes need to be made immediately.  Otherwise, you will receive a letter about your results with an explanation, but please check with MyChart first.  Please remember to sign up for MyChart if you have not done so, as this will be important to you in the future with finding out test results, communicating by private email, and scheduling acute appointments online when needed.  Please return in 1 year for your yearly visit, or sooner if needed, with Lab testing done 3-5 days before

## 2014-10-07 NOTE — Assessment & Plan Note (Signed)
stable overall by history and exam, recent data reviewed with pt, and pt to continue medical treatment as before,  to f/u any worsening symptoms or concerns BP Readings from Last 3 Encounters:  10/07/14 134/90  06/09/14 124/82  02/07/14 118/70

## 2014-10-07 NOTE — Progress Notes (Signed)
Pre visit review using our clinic review tool, if applicable. No additional management support is needed unless otherwise documented below in the visit note. 

## 2014-10-07 NOTE — Progress Notes (Signed)
Subjective:    Patient ID: Caitlin Wall, female    DOB: 1955/06/25, 59 y.o.   MRN: [id number]  HPI  Here for wellness and f/u;  Overall doing ok;  Pt denies Chest pain, worsening SOB, DOE, wheezing, orthopnea, PND, worsening LE edema, palpitations, dizziness or syncope.  Pt denies neurological change such as new headache, facial or extremity weakness.  Pt denies polydipsia, polyuria, or low sugar symptoms. Pt states overall good compliance with treatment and medications, good tolerability, and has been trying to follow appropriate diet.  Pt denies worsening depressive symptoms, suicidal ideation or panic. No fever, night sweats, wt loss, loss of appetite, or other constitutional symptoms.  Pt states good ability with ADL's, has low fall risk, home safety reviewed and adequate, no other significant changes in hearing or vision, and only occasionally active with exercise. Wt Readings from Last 3 Encounters:  10/07/14 199 lb (90.266 kg)  06/09/14 194 lb 9.6 oz (88.27 kg)  02/07/14 185 lb (83.915 kg)    Past Medical History  Diagnosis Date  . Hx of colonic polyps 1995    adenomatous  . GERD (gastroesophageal reflux disease)   . Hyperlipidemia   . Hypertension   . COLONIC POLYPS, HX OF 11/04/2007  . GERD 11/04/2007  . HYPERLIPIDEMIA 11/04/2007  . HYPERTENSION 06/07/2009  . HAIR LOSS 11/04/2007  . LBBB (left bundle branch block) 07/26/2011  . Asthma, persistent not controlled 07/27/2012   Past Surgical History  Procedure Laterality Date  . Bilat ear surgury/hearing loss    . Abdominal hysterectomy    . Anterior fusion cervical spine      reports that she quit smoking about 25 years ago. Her smoking use included Cigarettes. She has a 20 pack-year smoking history. She has never used smokeless tobacco. She reports that she does not drink alcohol or use illicit drugs. family history includes Breast cancer in her maternal grandmother and other; Lung cancer in her father. There is no history of  Coronary artery disease, Colon cancer, Pancreatic cancer, Rectal cancer, or Stomach cancer. Allergies  Allergen Reactions  . Codeine     REACTION: nausea,   Current Outpatient Prescriptions on File Prior to Visit  Medication Sig Dispense Refill  . aspirin 81 MG tablet Take 81 mg by mouth daily.      Marland Kitchen albuterol (PROVENTIL HFA;VENTOLIN HFA) 108 (90 BASE) MCG/ACT inhaler Inhale 2 puffs into the lungs every 6 (six) hours as needed for wheezing. (Patient not taking: Reported on 10/07/2014) 1 Inhaler 11  . Fluticasone Furoate-Vilanterol (BREO ELLIPTA) 100-25 MCG/INH AEPB Inhale 1 Inhaler into the lungs daily.    Marland Kitchen Umeclidinium Bromide (INCRUSE ELLIPTA) 62.5 MCG/INH AEPB Inhale 1 puff into the lungs daily. (Patient not taking: Reported on 10/07/2014) 1 each 0   No current facility-administered medications on file prior to visit.   Review of Systems Constitutional: Negative for increased diaphoresis, other activity, appetite or siginficant weight change other than noted HENT: Negative for worsening hearing loss, ear pain, facial swelling, mouth sores and neck stiffness.   Eyes: Negative for other worsening pain, redness or visual disturbance.  Respiratory: Negative for shortness of breath and wheezing  Cardiovascular: Negative for chest pain and palpitations.  Gastrointestinal: Negative for diarrhea, blood in stool, abdominal distention or other pain Genitourinary: Negative for hematuria, flank pain or change in urine volume.  Musculoskeletal: Negative for myalgias or other joint complaints.  Skin: Negative for color change and wound or drainage.  Neurological: Negative for syncope and numbness. other  than noted Hematological: Negative for adenopathy. or other swelling Psychiatric/Behavioral: Negative for hallucinations, SI, self-injury, decreased concentration or other worsening agitation.      Objective:   Physical Exam BP 134/90 mmHg  Pulse 83  Temp(Src) 98 F (36.7 C) (Oral)  Ht 5\' 5"   (1.651 m)  Wt 199 lb (90.266 kg)  BMI 33.12 kg/m2  SpO2 96% VS noted,  Constitutional: Pt is oriented to person, place, and time. Appears well-developed and well-nourished, in no significant distress Head: Normocephalic and atraumatic.  Right Ear: External ear normal.  Left Ear: External ear normal.  Nose: Nose normal.  Mouth/Throat: Oropharynx is clear and moist.  Eyes: Conjunctivae and EOM are normal. Pupils are equal, round, and reactive to light.  Neck: Normal range of motion. Neck supple. No JVD present. No tracheal deviation present or significant neck LA or mass Cardiovascular: Normal rate, regular rhythm, normal heart sounds and intact distal pulses.   Pulmonary/Chest: Effort normal and breath sounds without rales or wheezing  Abdominal: Soft. Bowel sounds are normal. NT. No HSM  Musculoskeletal: Normal range of motion. Exhibits no edema.  Lymphadenopathy:  Has no cervical adenopathy.  Neurological: Pt is alert and oriented to person, place, and time. Pt has normal reflexes. No cranial nerve deficit. Motor grossly intact Skin: Skin is warm and dry. No rash noted.  Psychiatric:  Has normal mood and affect. Behavior is normal.      Assessment & Plan:

## 2014-10-07 NOTE — Assessment & Plan Note (Signed)

## 2014-10-07 NOTE — Addendum Note (Signed)
Addended by: Lyman Bishop on: 10/07/2014 10:21 AM   Modules accepted: Orders

## 2014-10-17 ENCOUNTER — Other Ambulatory Visit: Payer: Self-pay

## 2014-10-17 DIAGNOSIS — Z1231 Encounter for screening mammogram for malignant neoplasm of breast: Secondary | ICD-10-CM

## 2014-10-25 ENCOUNTER — Ambulatory Visit
Admission: RE | Admit: 2014-10-25 | Discharge: 2014-10-25 | Disposition: A | Discharge status: Home / Self Care | Payer: BLUE CROSS/BLUE SHIELD | Source: Ambulatory Visit

## 2014-10-25 DIAGNOSIS — Z1231 Encounter for screening mammogram for malignant neoplasm of breast: Secondary | ICD-10-CM

## 2014-10-27 ENCOUNTER — Other Ambulatory Visit: Payer: Self-pay | Admitting: Obstetrics and Gynecology

## 2014-10-27 DIAGNOSIS — R928 Other abnormal and inconclusive findings on diagnostic imaging of breast: Secondary | ICD-10-CM

## 2014-11-01 ENCOUNTER — Other Ambulatory Visit: Payer: Self-pay | Admitting: Obstetrics and Gynecology

## 2014-11-01 ENCOUNTER — Other Ambulatory Visit: Payer: Self-pay

## 2014-11-01 DIAGNOSIS — R928 Other abnormal and inconclusive findings on diagnostic imaging of breast: Secondary | ICD-10-CM

## 2014-11-03 ENCOUNTER — Ambulatory Visit
Admission: RE | Admit: 2014-11-03 | Discharge: 2014-11-03 | Disposition: A | Discharge status: Home / Self Care | Payer: BLUE CROSS/BLUE SHIELD | Source: Ambulatory Visit | Attending: Obstetrics and Gynecology | Admitting: Obstetrics and Gynecology

## 2014-11-03 ENCOUNTER — Other Ambulatory Visit: Payer: Self-pay | Admitting: Obstetrics and Gynecology

## 2014-11-03 DIAGNOSIS — R928 Other abnormal and inconclusive findings on diagnostic imaging of breast: Secondary | ICD-10-CM

## 2014-11-03 DIAGNOSIS — N6320 Unspecified lump in the left breast, unspecified quadrant: Secondary | ICD-10-CM

## 2014-11-09 ENCOUNTER — Other Ambulatory Visit: Payer: Self-pay | Admitting: Obstetrics and Gynecology

## 2014-11-09 ENCOUNTER — Ambulatory Visit
Admission: RE | Admit: 2014-11-09 | Discharge: 2014-11-09 | Disposition: A | Discharge status: Home / Self Care | Payer: BLUE CROSS/BLUE SHIELD | Source: Ambulatory Visit | Attending: Obstetrics and Gynecology | Admitting: Obstetrics and Gynecology

## 2014-11-09 DIAGNOSIS — N6320 Unspecified lump in the left breast, unspecified quadrant: Secondary | ICD-10-CM

## 2014-11-22 ENCOUNTER — Other Ambulatory Visit: Payer: Self-pay | Admitting: General Surgery

## 2014-11-22 DIAGNOSIS — N6320 Unspecified lump in the left breast, unspecified quadrant: Secondary | ICD-10-CM

## 2014-12-01 ENCOUNTER — Other Ambulatory Visit: Payer: Self-pay | Admitting: General Surgery

## 2014-12-01 DIAGNOSIS — N6320 Unspecified lump in the left breast, unspecified quadrant: Secondary | ICD-10-CM

## 2014-12-09 ENCOUNTER — Ambulatory Visit: Payer: BLUE CROSS/BLUE SHIELD | Admitting: Internal Medicine

## 2014-12-21 ENCOUNTER — Encounter (HOSPITAL_BASED_OUTPATIENT_CLINIC_OR_DEPARTMENT_OTHER): Payer: Self-pay | Admitting: *Deleted

## 2014-12-23 ENCOUNTER — Other Ambulatory Visit: Payer: Self-pay

## 2014-12-23 ENCOUNTER — Ambulatory Visit (HOSPITAL_BASED_OUTPATIENT_CLINIC_OR_DEPARTMENT_OTHER)
Admission: RE | Admit: 2014-12-23 | Discharge: 2014-12-23 | Disposition: A | Discharge status: Home / Self Care | Payer: BLUE CROSS/BLUE SHIELD | Source: Ambulatory Visit | Attending: Internal Medicine | Admitting: Internal Medicine

## 2014-12-23 ENCOUNTER — Ambulatory Visit
Admission: RE | Admit: 2014-12-23 | Discharge: 2014-12-23 | Disposition: A | Discharge status: Home / Self Care | Payer: BLUE CROSS/BLUE SHIELD | Source: Ambulatory Visit | Attending: General Surgery | Admitting: General Surgery

## 2014-12-23 ENCOUNTER — Encounter (HOSPITAL_BASED_OUTPATIENT_CLINIC_OR_DEPARTMENT_OTHER)
Admission: RE | Admit: 2014-12-23 | Discharge: 2014-12-23 | Disposition: A | Discharge status: Home / Self Care | Payer: BLUE CROSS/BLUE SHIELD | Source: Ambulatory Visit | Attending: General Surgery | Admitting: General Surgery

## 2014-12-23 DIAGNOSIS — I4510 Unspecified right bundle-branch block: Secondary | ICD-10-CM | POA: Insufficient documentation

## 2014-12-23 DIAGNOSIS — J449 Chronic obstructive pulmonary disease, unspecified: Secondary | ICD-10-CM | POA: Diagnosis not present

## 2014-12-23 DIAGNOSIS — I10 Essential (primary) hypertension: Secondary | ICD-10-CM | POA: Insufficient documentation

## 2014-12-23 DIAGNOSIS — R9431 Abnormal electrocardiogram [ECG] [EKG]: Secondary | ICD-10-CM | POA: Diagnosis not present

## 2014-12-23 DIAGNOSIS — Z87891 Personal history of nicotine dependence: Secondary | ICD-10-CM | POA: Diagnosis not present

## 2014-12-23 DIAGNOSIS — K219 Gastro-esophageal reflux disease without esophagitis: Secondary | ICD-10-CM | POA: Diagnosis not present

## 2014-12-23 DIAGNOSIS — N6320 Unspecified lump in the left breast, unspecified quadrant: Secondary | ICD-10-CM

## 2014-12-23 DIAGNOSIS — Z79899 Other long term (current) drug therapy: Secondary | ICD-10-CM | POA: Diagnosis not present

## 2014-12-23 DIAGNOSIS — D0512 Intraductal carcinoma in situ of left breast: Secondary | ICD-10-CM | POA: Diagnosis not present

## 2014-12-23 DIAGNOSIS — Z803 Family history of malignant neoplasm of breast: Secondary | ICD-10-CM | POA: Diagnosis not present

## 2014-12-23 DIAGNOSIS — N63 Unspecified lump in breast: Secondary | ICD-10-CM | POA: Diagnosis present

## 2014-12-23 LAB — BASIC METABOLIC PANEL
ANION GAP: 8 (ref 5–15)
BUN: 11 mg/dL (ref 6–20)
CALCIUM: 9.9 mg/dL (ref 8.9–10.3)
CO2: 29 mmol/L (ref 22–32)
CREATININE: 0.83 mg/dL (ref 0.44–1.00)
Chloride: 106 mmol/L (ref 101–111)
GFR calc non Af Amer: >60 mL/min (ref >60)
Glucose, Bld: 76 mg/dL (ref 65–99)
Potassium: 4.2 mmol/L (ref 3.5–5.1)
SODIUM: 143 mmol/L (ref 135–145)

## 2014-12-26 NOTE — Progress Notes (Signed)
Cleared by Dr Conrad [place] ekg

## 2014-12-28 ENCOUNTER — Encounter (HOSPITAL_BASED_OUTPATIENT_CLINIC_OR_DEPARTMENT_OTHER)
Admission: RE | Disposition: A | Discharge status: Home / Self Care | Payer: Self-pay | Source: Ambulatory Visit | Attending: General Surgery

## 2014-12-28 ENCOUNTER — Ambulatory Visit (HOSPITAL_BASED_OUTPATIENT_CLINIC_OR_DEPARTMENT_OTHER): Payer: BLUE CROSS/BLUE SHIELD | Admitting: Anesthesiology

## 2014-12-28 ENCOUNTER — Ambulatory Visit (HOSPITAL_BASED_OUTPATIENT_CLINIC_OR_DEPARTMENT_OTHER)
Admission: RE | Admit: 2014-12-28 | Discharge: 2014-12-28 | Disposition: A | Discharge status: Home / Self Care | Payer: BLUE CROSS/BLUE SHIELD | Source: Ambulatory Visit | Attending: General Surgery | Admitting: General Surgery

## 2014-12-28 ENCOUNTER — Ambulatory Visit
Admission: RE | Admit: 2014-12-28 | Discharge: 2014-12-28 | Disposition: A | Discharge status: Home / Self Care | Payer: BLUE CROSS/BLUE SHIELD | Source: Ambulatory Visit | Attending: General Surgery | Admitting: General Surgery

## 2014-12-28 ENCOUNTER — Encounter (HOSPITAL_BASED_OUTPATIENT_CLINIC_OR_DEPARTMENT_OTHER): Payer: Self-pay | Admitting: Certified Registered"

## 2014-12-28 DIAGNOSIS — C50919 Malignant neoplasm of unspecified site of unspecified female breast: Secondary | ICD-10-CM

## 2014-12-28 DIAGNOSIS — N6320 Unspecified lump in the left breast, unspecified quadrant: Secondary | ICD-10-CM

## 2014-12-28 DIAGNOSIS — I10 Essential (primary) hypertension: Secondary | ICD-10-CM | POA: Insufficient documentation

## 2014-12-28 DIAGNOSIS — D0512 Intraductal carcinoma in situ of left breast: Secondary | ICD-10-CM | POA: Diagnosis not present

## 2014-12-28 DIAGNOSIS — K219 Gastro-esophageal reflux disease without esophagitis: Secondary | ICD-10-CM | POA: Insufficient documentation

## 2014-12-28 DIAGNOSIS — J449 Chronic obstructive pulmonary disease, unspecified: Secondary | ICD-10-CM | POA: Insufficient documentation

## 2014-12-28 DIAGNOSIS — Z803 Family history of malignant neoplasm of breast: Secondary | ICD-10-CM | POA: Insufficient documentation

## 2014-12-28 DIAGNOSIS — Z87891 Personal history of nicotine dependence: Secondary | ICD-10-CM | POA: Insufficient documentation

## 2014-12-28 DIAGNOSIS — Z79899 Other long term (current) drug therapy: Secondary | ICD-10-CM | POA: Insufficient documentation

## 2014-12-28 HISTORY — DX: Malignant neoplasm of unspecified site of unspecified female breast: C50.919

## 2014-12-28 HISTORY — PX: BREAST LUMPECTOMY WITH RADIOACTIVE SEED LOCALIZATION: SHX6424

## 2014-12-28 SURGERY — BREAST LUMPECTOMY WITH RADIOACTIVE SEED LOCALIZATION
Anesthesia: General | Site: Breast | Laterality: Left

## 2014-12-28 MED ORDER — FENTANYL CITRATE (PF) 100 MCG/2ML IJ SOLN: 50.0000 ug | INTRAMUSCULAR | Status: DC | PRN

## 2014-12-28 MED ORDER — CEFAZOLIN SODIUM-DEXTROSE 2-3 GM-% IV SOLR
INTRAVENOUS | Status: DC | PRN
  Administered 2014-12-28: 2 g via INTRAVENOUS

## 2014-12-28 MED ORDER — PROPOFOL 10 MG/ML IV BOLUS
INTRAVENOUS | Status: DC | PRN
  Administered 2014-12-28: 200 mg via INTRAVENOUS

## 2014-12-28 MED ORDER — OXYCODONE HCL 5 MG/5ML PO SOLN
5.0000 mg | Freq: Once | ORAL | Status: AC | PRN
Start: 2014-12-28 — End: 2014-12-28

## 2014-12-28 MED ORDER — HYDROMORPHONE HCL 1 MG/ML IJ SOLN: 0.2500 mg | INTRAMUSCULAR | Status: DC | PRN

## 2014-12-28 MED ORDER — FENTANYL CITRATE (PF) 100 MCG/2ML IJ SOLN
INTRAMUSCULAR | Status: AC
  Filled 2014-12-28: qty 4

## 2014-12-28 MED ORDER — CHLORHEXIDINE GLUCONATE 4 % EX LIQD: 1.0000 "application " | Freq: Once | CUTANEOUS | Status: DC

## 2014-12-28 MED ORDER — OXYCODONE HCL 5 MG PO TABS
ORAL_TABLET | ORAL | Status: AC
  Filled 2014-12-28: qty 1

## 2014-12-28 MED ORDER — OXYCODONE HCL 5 MG PO TABS
5.0000 mg | ORAL_TABLET | Freq: Once | ORAL | Status: AC | PRN
  Administered 2014-12-28: 5 mg via ORAL

## 2014-12-28 MED ORDER — MIDAZOLAM HCL 5 MG/5ML IJ SOLN
INTRAMUSCULAR | Status: DC | PRN
  Administered 2014-12-28: 2 mg via INTRAVENOUS

## 2014-12-28 MED ORDER — LACTATED RINGERS IV SOLN
INTRAVENOUS | Status: DC | PRN
  Administered 2014-12-28 (×2): via INTRAVENOUS

## 2014-12-28 MED ORDER — LACTATED RINGERS IV SOLN: INTRAVENOUS | Status: DC

## 2014-12-28 MED ORDER — GLYCOPYRROLATE 0.2 MG/ML IJ SOLN: 0.2000 mg | Freq: Once | INTRAMUSCULAR | Status: DC | PRN

## 2014-12-28 MED ORDER — ONDANSETRON HCL 4 MG/2ML IJ SOLN
INTRAMUSCULAR | Status: DC | PRN
  Administered 2014-12-28: 4 mg via INTRAVENOUS

## 2014-12-28 MED ORDER — DEXAMETHASONE SODIUM PHOSPHATE 4 MG/ML IJ SOLN
INTRAMUSCULAR | Status: DC | PRN
  Administered 2014-12-28: 10 mg via INTRAVENOUS

## 2014-12-28 MED ORDER — MEPERIDINE HCL 25 MG/ML IJ SOLN: 6.2500 mg | INTRAMUSCULAR | Status: DC | PRN

## 2014-12-28 MED ORDER — MIDAZOLAM HCL 2 MG/2ML IJ SOLN: 1.0000 mg | INTRAMUSCULAR | Status: DC | PRN

## 2014-12-28 MED ORDER — MIDAZOLAM HCL 2 MG/2ML IJ SOLN
INTRAMUSCULAR | Status: AC
  Filled 2014-12-28: qty 4

## 2014-12-28 MED ORDER — BUPIVACAINE-EPINEPHRINE (PF) 0.25% -1:200000 IJ SOLN
INTRAMUSCULAR | Status: DC | PRN
Start: 2014-12-28 — End: 2014-12-28
  Administered 2014-12-28: 8 mL via PERINEURAL

## 2014-12-28 MED ORDER — LIDOCAINE HCL (CARDIAC) 20 MG/ML IV SOLN
INTRAVENOUS | Status: DC | PRN
  Administered 2014-12-28: 60 mg via INTRAVENOUS

## 2014-12-28 MED ORDER — HYDROCODONE-ACETAMINOPHEN 5-325 MG PO TABS: 1.0000 | ORAL_TABLET | Freq: Four times a day (QID) | ORAL | Status: DC | PRN

## 2014-12-28 MED ORDER — SCOPOLAMINE 1 MG/3DAYS TD PT72: 1.0000 | MEDICATED_PATCH | Freq: Once | TRANSDERMAL | Status: DC | PRN

## 2014-12-28 MED ORDER — CEFAZOLIN SODIUM-DEXTROSE 2-3 GM-% IV SOLR: 2.0000 g | INTRAVENOUS | Status: DC

## 2014-12-28 MED ORDER — FENTANYL CITRATE (PF) 100 MCG/2ML IJ SOLN
INTRAMUSCULAR | Status: DC | PRN
  Administered 2014-12-28: 50 ug via INTRAVENOUS

## 2014-12-28 SURGICAL SUPPLY — 42 items
APPLIER CLIP 9.375 MED OPEN (MISCELLANEOUS)
APR CLP MED 9.3 20 MLT OPN (MISCELLANEOUS)
BLADE SURG 15 STRL LF DISP TIS (BLADE) ×1 IMPLANT
BLADE SURG 15 STRL SS (BLADE) ×2
CANISTER SUC SOCK COL 7IN (MISCELLANEOUS) ×2 IMPLANT
CANISTER SUCT 1200ML W/VALVE (MISCELLANEOUS) ×2 IMPLANT
CHLORAPREP W/TINT 26ML (MISCELLANEOUS) ×2 IMPLANT
CLIP APPLIE 9.375 MED OPEN (MISCELLANEOUS) IMPLANT
COVER BACK TABLE 60X90IN (DRAPES) ×2 IMPLANT
COVER MAYO STAND STRL (DRAPES) ×2 IMPLANT
COVER PROBE W GEL 5X96 (DRAPES) ×2 IMPLANT
DECANTER SPIKE VIAL GLASS SM (MISCELLANEOUS) IMPLANT
DEVICE DUBIN W/COMP PLATE 8390 (MISCELLANEOUS) ×2 IMPLANT
DRAPE LAPAROSCOPIC ABDOMINAL (DRAPES) IMPLANT
DRAPE UTILITY XL STRL (DRAPES) ×2 IMPLANT
ELECT COATED BLADE 2.86 ST (ELECTRODE) ×2 IMPLANT
ELECT REM PT RETURN 9FT ADLT (ELECTROSURGICAL) ×2
ELECTRODE REM PT RTRN 9FT ADLT (ELECTROSURGICAL) ×1 IMPLANT
GLOVE BIO SURGEON STRL SZ 6.5 (GLOVE) ×1 IMPLANT
GLOVE BIO SURGEON STRL SZ7.5 (GLOVE) ×4 IMPLANT
GLOVE BIOGEL PI IND STRL 7.0 (GLOVE) IMPLANT
GLOVE BIOGEL PI INDICATOR 7.0 (GLOVE) ×1
GLOVE ECLIPSE 6.5 STRL STRAW (GLOVE) ×1 IMPLANT
GOWN STRL REUS W/ TWL LRG LVL3 (GOWN DISPOSABLE) ×2 IMPLANT
GOWN STRL REUS W/TWL LRG LVL3 (GOWN DISPOSABLE) ×4
KIT MARKER MARGIN INK (KITS) ×2 IMPLANT
LIQUID BAND (GAUZE/BANDAGES/DRESSINGS) ×2 IMPLANT
NDL HYPO 25X1 1.5 SAFETY (NEEDLE) IMPLANT
NEEDLE HYPO 25X1 1.5 SAFETY (NEEDLE) IMPLANT
NS IRRIG 1000ML POUR BTL (IV SOLUTION) IMPLANT
PACK BASIN DAY SURGERY FS (CUSTOM PROCEDURE TRAY) ×2 IMPLANT
PENCIL BUTTON HOLSTER BLD 10FT (ELECTRODE) ×2 IMPLANT
SLEEVE SCD COMPRESS KNEE MED (MISCELLANEOUS) ×2 IMPLANT
SPONGE LAP 18X18 X RAY DECT (DISPOSABLE) ×2 IMPLANT
SUT MON AB 4-0 PC3 18 (SUTURE) IMPLANT
SUT SILK 2 0 SH (SUTURE) IMPLANT
SUT VICRYL 3-0 CR8 SH (SUTURE) ×2 IMPLANT
SYR CONTROL 10ML LL (SYRINGE) IMPLANT
TOWEL OR 17X24 6PK STRL BLUE (TOWEL DISPOSABLE) ×2 IMPLANT
TOWEL OR NON WOVEN STRL DISP B (DISPOSABLE) ×2 IMPLANT
TUBE CONNECTING 20X1/4 (TUBING) ×2 IMPLANT
YANKAUER SUCT BULB TIP NO VENT (SUCTIONS) IMPLANT

## 2014-12-28 NOTE — Op Note (Signed)
12/28/2014  12:35 PM  PATIENT:  Caitlin Wall  59 y.o. female  PRE-OPERATIVE DIAGNOSIS:  LEFT BREAST MASS  POST-OPERATIVE DIAGNOSIS:  LEFT BREAST MASS  PROCEDURE:  Procedure(s): BREAST LUMPECTOMY WITH RADIOACTIVE SEED LOCALIZATION (Left)  SURGEON:  Surgeon(s) and Role:    * Jovita Kussmaul, MD - Primary  PHYSICIAN ASSISTANT:   ASSISTANTS: none   ANESTHESIA:   general  EBL:  Total I/O In: 1200 [I.V.:1200] Out: -   BLOOD ADMINISTERED:none  DRAINS: none   LOCAL MEDICATIONS USED:  MARCAINE     SPECIMEN:  Source of Specimen:  left breast tissue  DISPOSITION OF SPECIMEN:  PATHOLOGY  COUNTS:  YES  TOURNIQUET:  * No tourniquets in log *  DICTATION: .Dragon Dictation   After informed consent was obtained the patient was brought to the operating room and placed in the supine position on the operating room table. After adequate induction of general anesthesia the patient's left breast was prepped with ChloraPrep, allowed to dry, and manner. Previously an I-125 seed was placed in the lower outer quadrant of the left breast to mark in area of discordance. The neoprobe was set to I-125 in the area of radioactivity was readily identified in the lower outer quadrant of the left breast. The area was infiltrated with quarter percent Marcaine with epinephrine. A radial type incision was made overlying the area of radioactivity with a 15 blade knife. The incision was carried through the skin and subcutaneous tissue sharply with electrocautery. While checking the area of radioactivity frequently with the neoprobe a circular portion of breast tissue was excised sharply with the electrocautery around the radioactive seed. Once the specimen was removed it was oriented with the appropriate paint colors. A specimen radiograph was obtained that showed the clip in seed to be in the specimen. The specimen was then sent to pathology for further evaluation. Hemostasis was achieved using the Bovie  electrocautery the wound was irrigated with saline. The wound was then closed with a deep layer of interrupted 3-0 Vicryl stitches. The skin was then closed with interrupted 4-0 Monocryl subcuticular stitches. Dermabond dressings were applied. The patient tolerated the procedure well. At the end of the case all needle sponge and instrument counts were correct. The patient was then awakened and taken to recovery in stable condition.  PLAN OF CARE: Discharge to home after PACU  PATIENT DISPOSITION:  PACU - hemodynamically stable.   Delay start of Pharmacological VTE agent (>24hrs) due to surgical blood loss or risk of bleeding: not applicable

## 2014-12-28 NOTE — Interval H&P Note (Signed)
History and Physical Interval Note:  12/28/2014 10:07 AM  Caitlin Wall  has presented today for surgery, with the diagnosis of LEFT BREAST MASS  The various methods of treatment have been discussed with the patient and family. After consideration of risks, benefits and other options for treatment, the patient has consented to  Procedure(Wall): BREAST LUMPECTOMY WITH RADIOACTIVE SEED LOCALIZATION (Left) as a surgical intervention .  The patient'Wall history has been reviewed, patient examined, no change in status, stable for surgery.  I have reviewed the patient'Wall chart and labs.  Questions were answered to the patient'Wall satisfaction.     TOTH III,Caitlin Wall

## 2014-12-28 NOTE — H&P (Signed)
Caitlin Wall. Longo 11/22/2014 9:00 AM Location: [facility] Patient #: [id number] DOB: October 08, 1955 Married / Language: English / Race: White Female   History of Present Illness Caitlin Wall S. Marlou Starks MD; 11/22/2014 9:27 AM) The patient is a 59 year old female who presents with a breast mass. We are asked to see the patient in consultation by Dr. [name] to evaluate her for a left breast mass. The patient is a 59 year old white female who recently went for a routine screening mammogram. At that time she was found to have a small 5 mm mass in the lower outer quadrant of the left breast. This was biopsied and came back benign. The results were thought to be discordant. She denies any breast pain. She denies any discharge from the nipple. She does not take any hormone replacement. Her only family history is for breast cancer in a paternal grandmother.   Other Problems Caitlin Wall, CMA; 11/22/2014 9:00 AM) Chronic Obstructive Lung Disease High blood pressure  Past Surgical History Caitlin Wall, CMA; 11/22/2014 9:00 AM) Breast Biopsy Left. Colon Polyp Removal - Colonoscopy Hysterectomy (not due to cancer) - Partial Spinal Surgery - Neck  Diagnostic Studies History Caitlin Wall, CMA; 11/22/2014 9:00 AM) Colonoscopy 1-5 years ago Mammogram within last year Pap Smear 1-5 years ago  Allergies Caitlin Wall, CMA; 11/22/2014 9:00 AM) Codeine Phosphate *ANALGESICS - OPIOID*  Medication History Caitlin Wall, CMA; 11/22/2014 9:02 AM) Albuterol Sulfate HFA (108 (90 Base)MCG/ACT Aerosol Soln, Inhalation) Active. Losartan Potassium (50MG  Tablet, Oral) Active. Fluticasone Furoate-Vilanterol (200-25MCG/INH Aero Pow Br Act, Inhalation) Active. Medications Reconciled  Social History Caitlin Wall, CMA; 11/22/2014 9:00 AM) Alcohol use Occasional alcohol use. Caffeine use Carbonated beverages, Tea. No drug use Tobacco use Former smoker.  Family History Caitlin Wall, [place];  11/22/2014 9:00 AM) Respiratory Condition Father.  Pregnancy / Birth History Caitlin Wall, [place]; 11/22/2014 9:00 AM) Age at menarche 57 years. Age of menopause 78-60 Contraceptive History Oral contraceptives. Gravida 3 Maternal age 85-20 Para 3    Review of Systems Caitlin Wall CMA; 11/22/2014 9:00 AM) General Not Present- Appetite Loss, Chills, Fatigue, Fever, Night Sweats, Weight Gain and Weight Loss. HEENT Present- Hearing Loss and Wears glasses/contact lenses. Not Present- Earache, Hoarseness, Nose Bleed, Oral Ulcers, Ringing in the Ears, Seasonal Allergies, Sinus Pain, Sore Throat, Visual Disturbances and Yellow Eyes. Respiratory Present- Difficulty Breathing. Not Present- Bloody sputum, Chronic Cough, Snoring and Wheezing. Breast Not Present- Breast Mass, Breast Pain, Nipple Discharge and Skin Changes. Cardiovascular Not Present- Chest Pain, Difficulty Breathing Lying Down, Leg Cramps, Palpitations, Rapid Heart Rate, Shortness of Breath and Swelling of Extremities. Gastrointestinal Not Present- Abdominal Pain, Bloating, Bloody Stool, Change in Bowel Habits, Chronic diarrhea, Constipation, Difficulty Swallowing, Excessive gas, Gets full quickly at meals, Hemorrhoids, Indigestion, Nausea, Rectal Pain and Vomiting. Musculoskeletal Not Present- Back Pain, Joint Pain, Joint Stiffness, Muscle Pain, Muscle Weakness and Swelling of Extremities. Neurological Not Present- Decreased Memory, Fainting, Headaches, Numbness, Seizures, Tingling, Tremor, Trouble walking and Weakness. Psychiatric Not Present- Anxiety, Bipolar, Change in Sleep Pattern, Depression, Fearful and Frequent crying. Endocrine Not Present- Cold Intolerance, Excessive Hunger, Hair Changes, Heat Intolerance, Hot flashes and New Diabetes. Hematology Present- Easy Bruising. Not Present- Excessive bleeding, Gland problems, HIV and Persistent Infections.  Vitals Caitlin Wall CMA; 11/22/2014 9:02 AM) 11/22/2014 9:02 AM Weight: 198  lb Height: 65in Body Surface Area: 2.03 m Body Mass Index: 32.95 kg/m  Temp.: 97.52F(Temporal)  Pulse: 70 (Regular)  BP: 124/72 (Sitting, Left Arm, Standard)     Physical Exam (  Sammuel Hines. Marlou Starks MD; 11/22/2014 9:27 AM) General Mental Status-Alert. General Appearance-Consistent with stated age. Hydration-Well hydrated. Voice-Normal.  Head and Neck Head-normocephalic, atraumatic with no lesions or palpable masses. Trachea-midline. Thyroid Gland Characteristics - normal size and consistency.  Eye Eyeball - Bilateral-Extraocular movements intact. Sclera/Conjunctiva - Bilateral-No scleral icterus.  Chest and Lung Exam Chest and lung exam reveals -quiet, even and easy respiratory effort with no use of accessory muscles and on auscultation, normal breath sounds, no adventitious sounds and normal vocal resonance. Inspection Chest Wall - Normal. Back - normal.  Breast Note: There is no palpable mass in either breast. There is no palpable axillary, supra clavicular, or cervical lymphadenopathy. She has subpectoral implants in place.   Cardiovascular Cardiovascular examination reveals -normal heart sounds, regular rate and rhythm with no murmurs and normal pedal pulses bilaterally.  Abdomen Inspection Inspection of the abdomen reveals - No Hernias. Skin - Scar - no surgical scars. Palpation/Percussion Palpation and Percussion of the abdomen reveal - Soft, Non Tender, No Rebound tenderness, No Rigidity (guarding) and No hepatosplenomegaly. Auscultation Auscultation of the abdomen reveals - Bowel sounds normal.  Neurologic Neurologic evaluation reveals -alert and oriented x 3 with no impairment of recent or remote memory. Mental Status-Normal.  Musculoskeletal Normal Exam - Left-Upper Extremity Strength Normal and Lower Extremity Strength Normal. Normal Exam - Right-Upper Extremity Strength Normal and Lower Extremity Strength  Normal.  Lymphatic Head & Neck  General Head & Neck Lymphatics: Bilateral - Description - Normal. Axillary  General Axillary Region: Bilateral - Description - Normal. Tenderness - Non Tender. Femoral & Inguinal  Generalized Femoral & Inguinal Lymphatics: Bilateral - Description - Normal. Tenderness - Non Tender.    Assessment & Plan Caitlin Wall S. Marlou Starks MD; 11/22/2014 9:22 AM) LEFT BREAST MASS (N63) Impression: The patient has a 5 mm mass that was seen in the lower outer quadrant of the left breast on her recent mammogram. Her biopsy results were discordant. Because of this my recommendation would be to have this area removed with an open biopsy. I have discussed with her in detail the risks and benefits of the operation to do this as well as some of the technical aspects and she understands and wishes to proceed. I will plan for a left breast radioactive seed localized lumpectomy Current Plans Pt Education - Breast Diseases: discussed with patient and provided information.   Signed by Luella Cook, MD (11/22/2014 9:28 AM)

## 2014-12-28 NOTE — Anesthesia Preprocedure Evaluation (Signed)
Anesthesia Evaluation  Patient identified by MRN, date of birth, ID band Patient awake    Reviewed: Allergy & Precautions, NPO status , Patient's Chart, lab work & pertinent test results  Airway Mallampati: I  TM Distance: >3 FB Neck ROM: Full    Dental  (+) Teeth Intact, Dental Advisory Given   Pulmonary COPD, former smoker,    breath sounds clear to auscultation       Cardiovascular hypertension, Pt. on medications  Rhythm:Regular Rate:Normal     Neuro/Psych    GI/Hepatic GERD  ,  Endo/Other    Renal/GU      Musculoskeletal   Abdominal   Peds  Hematology   Anesthesia Other Findings   Reproductive/Obstetrics                             Anesthesia Physical Anesthesia Plan  ASA: II  Anesthesia Plan: General   Post-op Pain Management:    Induction: Intravenous  Airway Management Planned: LMA  Additional Equipment:   Intra-op Plan:   Post-operative Plan: Extubation in OR  Informed Consent: I have reviewed the patients History and Physical, chart, labs and discussed the procedure including the risks, benefits and alternatives for the proposed anesthesia with the patient or authorized representative who has indicated his/her understanding and acceptance.   Dental advisory given  Plan Discussed with: CRNA, Anesthesiologist and Surgeon  Anesthesia Plan Comments:         Anesthesia Quick Evaluation

## 2014-12-28 NOTE — Anesthesia Procedure Notes (Signed)
Procedure Name: LMA Insertion Date/Time: 12/28/2014 11:53 AM Performed by: Analise Glotfelty D Pre-anesthesia Checklist: Patient identified, Emergency Drugs available, Suction available and Patient being monitored Patient Re-evaluated:Patient Re-evaluated prior to inductionOxygen Delivery Method: Circle System Utilized Preoxygenation: Pre-oxygenation with 100% oxygen Intubation Type: IV induction Ventilation: Mask ventilation without difficulty LMA: LMA inserted LMA Size: 4.0 Number of attempts: 1 Airway Equipment and Method: Bite block Placement Confirmation: positive ETCO2 Tube secured with: Tape Dental Injury: Teeth and Oropharynx as per pre-operative assessment

## 2014-12-28 NOTE — Transfer of Care (Signed)
Immediate Anesthesia Transfer of Care Note  Patient: Caitlin Wall  Procedure(s) Performed: Procedure(s): BREAST LUMPECTOMY WITH RADIOACTIVE SEED LOCALIZATION (Left)  Patient Location: PACU  Anesthesia Type:General  Level of Consciousness: awake, alert , oriented and patient cooperative  Airway & Oxygen Therapy: Patient Spontanous Breathing and Patient connected to face mask oxygen  Post-op Assessment: Report given to RN and Post -op Vital signs reviewed and stable  Post vital signs: Reviewed and stable  Last Vitals:  Filed Vitals:   12/28/14 1111  BP: 138/58  Pulse: 68  Temp: 36.9 C  Resp: 16    Complications: No apparent anesthesia complications

## 2014-12-28 NOTE — Anesthesia Postprocedure Evaluation (Signed)
  Anesthesia Post-op Note  Patient: Caitlin Wall  Procedure(s) Performed: Procedure(s): BREAST LUMPECTOMY WITH RADIOACTIVE SEED LOCALIZATION (Left)  Patient Location: PACU  Anesthesia Type:General  Level of Consciousness: awake, alert  and oriented  Airway and Oxygen Therapy: Patient Spontanous Breathing  Post-op Pain: mild  Post-op Assessment: Post-op Vital signs reviewed and Patient's Cardiovascular Status Stable              Post-op Vital Signs: Reviewed and stable  Last Vitals:  Filed Vitals:   12/28/14 1347  BP: 120/53  Pulse: 81  Temp: 36.7 C  Resp: 18    Complications: No apparent anesthesia complications

## 2014-12-28 NOTE — Discharge Instructions (Signed)

## 2014-12-29 ENCOUNTER — Ambulatory Visit (INDEPENDENT_AMBULATORY_CARE_PROVIDER_SITE_OTHER): Payer: BLUE CROSS/BLUE SHIELD

## 2014-12-29 ENCOUNTER — Encounter (HOSPITAL_BASED_OUTPATIENT_CLINIC_OR_DEPARTMENT_OTHER): Payer: Self-pay | Admitting: General Surgery

## 2014-12-29 DIAGNOSIS — Z23 Encounter for immunization: Secondary | ICD-10-CM

## 2015-01-10 ENCOUNTER — Telehealth: Payer: Self-pay | Admitting: Oncology

## 2015-01-10 NOTE — Telephone Encounter (Signed)
new patient appt-s/w patient and gave np appt for 11/21 @ 4 w/Dr. Jana Hakim Referring Dr. Autumn Messing Dx-Left breast mass  .  Referral information scanned Under media tab

## 2015-01-11 ENCOUNTER — Telehealth: Payer: Self-pay | Admitting: *Deleted

## 2015-01-11 NOTE — Telephone Encounter (Signed)
Mailed calendar, before appt letter, welcoming packet & intake form to pt.

## 2015-01-13 ENCOUNTER — Other Ambulatory Visit: Payer: Self-pay | Admitting: *Deleted

## 2015-01-13 DIAGNOSIS — C50919 Malignant neoplasm of unspecified site of unspecified female breast: Secondary | ICD-10-CM

## 2015-01-16 ENCOUNTER — Ambulatory Visit (HOSPITAL_BASED_OUTPATIENT_CLINIC_OR_DEPARTMENT_OTHER): Payer: BLUE CROSS/BLUE SHIELD | Admitting: Oncology

## 2015-01-16 ENCOUNTER — Other Ambulatory Visit (HOSPITAL_BASED_OUTPATIENT_CLINIC_OR_DEPARTMENT_OTHER): Payer: BLUE CROSS/BLUE SHIELD

## 2015-01-16 ENCOUNTER — Encounter: Payer: Self-pay | Admitting: Radiation Oncology

## 2015-01-16 VITALS — BP 144/76 | HR 88 | Temp 98.9°F | Resp 18 | Ht 65.0 in | Wt 199.5 lb

## 2015-01-16 DIAGNOSIS — Z170 Estrogen receptor positive status [ER+]: Secondary | ICD-10-CM

## 2015-01-16 DIAGNOSIS — C50512 Malignant neoplasm of lower-outer quadrant of left female breast: Secondary | ICD-10-CM | POA: Diagnosis not present

## 2015-01-16 DIAGNOSIS — C50919 Malignant neoplasm of unspecified site of unspecified female breast: Secondary | ICD-10-CM | POA: Diagnosis not present

## 2015-01-16 LAB — COMPREHENSIVE METABOLIC PANEL (CC13)
ALT: 45 U/L (ref 0–55)
ANION GAP: 10 meq/L (ref 3–11)
AST: 47 U/L — ABNORMAL HIGH (ref 5–34)
Albumin: 3.7 g/dL (ref 3.5–5.0)
Alkaline Phosphatase: 159 U/L — ABNORMAL HIGH (ref 40–150)
BUN: 11 mg/dL (ref 7.0–26.0)
CALCIUM: 9.9 mg/dL (ref 8.4–10.4)
CHLORIDE: 107 meq/L (ref 98–109)
CO2: 26 meq/L (ref 22–29)
Creatinine: 0.9 mg/dL (ref 0.6–1.1)
EGFR: 75 mL/min/{1.73_m2} — AB (ref >90)
Glucose: 112 mg/dl (ref 70–140)
POTASSIUM: 3.9 meq/L (ref 3.5–5.1)
Sodium: 143 mEq/L (ref 136–145)
Total Bilirubin: 1.22 mg/dL — ABNORMAL HIGH (ref 0.20–1.20)
Total Protein: 6.9 g/dL (ref 6.4–8.3)

## 2015-01-16 LAB — CBC WITH DIFFERENTIAL/PLATELET
BASO%: 0.6 % (ref 0.0–2.0)
BASOS ABS: 0 10*3/uL (ref 0.0–0.1)
EOS%: 1.8 % (ref 0.0–7.0)
Eosinophils Absolute: 0.2 10*3/uL (ref 0.0–0.5)
HEMATOCRIT: 44.4 % (ref 34.8–46.6)
HGB: 14.6 g/dL (ref 11.6–15.9)
LYMPH%: 27.5 % (ref 14.0–49.7)
MCH: 29.6 pg (ref 25.1–34.0)
MCHC: 32.9 g/dL (ref 31.5–36.0)
MCV: 90.2 fL (ref 79.5–101.0)
MONO#: 0.7 10*3/uL (ref 0.1–0.9)
MONO%: 8.5 % (ref 0.0–14.0)
NEUT#: 5.4 10*3/uL (ref 1.5–6.5)
NEUT%: 61.6 % (ref 38.4–76.8)
PLATELETS: 215 10*3/uL (ref 145–400)
RBC: 4.92 10*6/uL (ref 3.70–5.45)
RDW: 13 % (ref 11.2–14.5)
WBC: 8.7 10*3/uL (ref 3.9–10.3)
lymph#: 2.4 10*3/uL (ref 0.9–3.3)

## 2015-01-16 NOTE — Progress Notes (Signed)
[place]  [phone]) [phone] [phone]     ID: Marvette Schamp Masters DOB: 03-Dec-1955  MR#: [id number]  TDV#:[id number]  Patient Care Team: Biagio Borg, MD as PCP - General Chauncey Cruel, MD as Consulting Physician (Oncology) PCP: [name], MD SU: Star Age MD OTHER MD: Keturah Barre MD, Kyung Rudd MD  CHIEF COMPLAINT: Ductal carcinoma in situ  CURRENT TREATMENT: Awaiting adjuvant radiation   BREAST CANCER HISTORY: Ezmeralda [facility]. She has subpectoral saline implants in place. There was a possible asymmetry in the left breast and she was recalled for left diagnostic mammography with tomosynthesis and left breast ultrasonography 11/03/2014. The breast density was category C. In the left breast lower outer quadrant there was an irregular mass which was not palpable and which by ultrasonography measured 0.6 cm. The left axilla was sonographically benign. Biopsy of this mass 11/09/2014 showed ([id number]) fibroadipose adipose tissue. This was felt to be discordant.  Accordingly the patient was referred to surgery and she underwent left breast radioactive seed localized lumpectomy 12/28/2014. The pathology from that procedure (([id number]) showed ductal carcinoma in situ, high-grade, measuring 0.2 cm. This was less than 0.1 cm from the posterior margin. The cells was estrogen receptor positive at 95%, with strong staining intensity, progesterone receptor positive at 30% with moderate staining intensity.  Her subsequent history is as detailed below.   INTERVAL HISTORY: Lameisha was evaluated in the breast clinic 01/16/2015 accompanied by her husband [name]. Her case was also presented in the multidisciplinary breast cancer conference 01/11/2015. At that time the negative though close posterior margin was discussed. This is where the implant is and therefore no further surgery is recommended. The  suggestion was made for adjuvant radiation to be followed by adjuvant anti-estrogens.  REVIEW OF SYSTEMS: There were no specific symptoms leading to the original mammogram, which was routinely scheduled. The patient denies unusual headaches, visual changes, nausea, vomiting, stiff neck, dizziness, or gait imbalance. There has been no cough, phlegm production, or pleurisy, no chest pain or pressure, and no change in bowel or bladder habits. The patient denies fever, rash, bleeding, unexplained fatigue or unexplained weight loss. She did well with her recent surgery, with no unusual pain, fever, or bleeding problems. A detailed review of systems was otherwise entirely negative.  PAST MEDICAL HISTORY: Past Medical History  Diagnosis Date  . Hx of colonic polyps 1995    adenomatous  . GERD (gastroesophageal reflux disease)   . Hyperlipidemia   . Hypertension   . COLONIC POLYPS, HX OF 11/04/2007  . GERD 11/04/2007  . HYPERLIPIDEMIA 11/04/2007  . HYPERTENSION 06/07/2009  . HAIR LOSS 11/04/2007  . LBBB (left bundle branch block) 07/26/2011  . Asthma, persistent not controlled 07/27/2012  . Breast cancer ([place]) 12/28/14    Left BresastDCIS    PAST SURGICAL HISTORY: Past Surgical History  Procedure Laterality Date  . Bilat ear surgury/hearing loss    . Abdominal hysterectomy    . Anterior fusion cervical spine    . Breast lumpectomy with radioactive seed localization Left 12/28/2014    Procedure: BREAST LUMPECTOMY WITH RADIOACTIVE SEED LOCALIZATION;  Surgeon: Autumn Messing III, MD;  Location: [facility];  Service: General;  Laterality: Left;    FAMILY HISTORY Family History  Problem Relation Age of Onset  . Lung cancer Father   . Breast cancer Maternal Grandmother   . Breast cancer Other   . Coronary artery  disease Neg Hx   . Colon cancer Neg Hx   . Pancreatic cancer Neg Hx   . Rectal cancer Neg Hx   . Stomach cancer Neg Hx   . Cancer Paternal Grandmother     breast   the patient's  father died from lung cancer in the setting of tobacco abuse at age 57. [name] died from "old age" at age 57. The patient had one brother, 3 sisters. The brother had" I cancer" requiring enucleation--the patient does not know whether this was a primary ocular melanoma. [name] was diagnosed with breast cancer but the patient does not know at what age. There is no other history of breast or ovarian cancer in the family  GYNECOLOGIC HISTORY:  No LMP recorded. Patient has had a hysterectomy. Menarche age 59, first live birth age 48. The patient is GX P3. She is status post hysterectomy without salpingo-oophorectomy. She did not take hormone replacement. She did use oral contraceptives remotely without any complications.  SOCIAL HISTORY:  Caila works in accounts payable. Her husband [name] is disabled because of back problems. [name] is a Marine scientist working in rehabilitation in [place]. [name] lives in [place] where he works as a Games developer. [name] lives in [place] where he works as a Emergency planning/management officer. The patient has 4 grandchildren. She is not a Ambulance person.    ADVANCED DIRECTIVES: Not in place   HEALTH MAINTENANCE: Social History  Substance Use Topics  . Smoking status: Former Smoker -- 1.00 packs/day for 20 years    Types: Cigarettes    Quit date: 08/19/1989  . Smokeless tobacco: Never Used  . Alcohol Use: No     Colonoscopy:  PAP:  Bone density:  Lipid panel:  Allergies  Allergen Reactions  . Codeine     REACTION: nausea,    Current Outpatient Prescriptions  Medication Sig Dispense Refill  . aspirin 81 MG tablet Take 81 mg by mouth daily.      Marland Kitchen HYDROcodone-acetaminophen (NORCO) 5-325 MG tablet Take 1-2 tablets by mouth every 6 (six) hours as needed. 30 tablet 0  . losartan (COZAAR) 50 MG tablet Take 1 tablet (50 mg total) by mouth daily. 90 tablet 3   No current facility-administered medications for  this visit.    OBJECTIVE: Middle-aged white woman in no acute distress Filed Vitals:   01/16/15 1610  BP: 144/76  Pulse: 88  Temp: 98.9 F (37.2 C)  Resp: 18     Body mass index is 33.2 kg/(m^2).    ECOG FS:1 - Symptomatic but completely ambulatory  Ocular: Sclerae unicteric, pupils equal, round and reactive to light Ear-nose-throat: Oropharynx clear and moist Lymphatic: No cervical or supraclavicular adenopathy Lungs no rales or rhonchi, good excursion bilaterally Heart regular rate and rhythm, no murmur appreciated Abd soft, nontender, positive bowel sounds MSK no focal spinal tenderness, no joint edema Neuro: non-focal, well-oriented, appropriate affect Breasts: The right breast is unremarkable. The left breast is status post recent lumpectomy. The incision is in the inferior mammary fold area and not visible from a frontal or lateral view. The cosmetic result is excellent. There is no dehiscence, inflammation, or erythema. The left axilla is benign.   LAB RESULTS:  CMP     Component Value Date/Time   NA 143 01/16/2015 1546   NA 143 12/23/2014 1330   K 3.9 01/16/2015 1546   K 4.2 12/23/2014 1330   CL 106 12/23/2014 1330   CO2 26 01/16/2015 1546  CO2 29 12/23/2014 1330   GLUCOSE 112 01/16/2015 1546   GLUCOSE 76 12/23/2014 1330   BUN 11.0 01/16/2015 1546   BUN 11 12/23/2014 1330   CREATININE 0.9 01/16/2015 1546   CREATININE 0.83 12/23/2014 1330   CALCIUM 9.9 01/16/2015 1546   CALCIUM 9.9 12/23/2014 1330   PROT 6.9 01/16/2015 1546   PROT 6.9 10/07/2014 1013   ALBUMIN 3.7 01/16/2015 1546   ALBUMIN 3.9 10/07/2014 1013   AST 47* 01/16/2015 1546   AST 23 10/07/2014 1013   ALT 45 01/16/2015 1546   ALT 20 10/07/2014 1013   ALKPHOS 159* 01/16/2015 1546   ALKPHOS 122* 10/07/2014 1013   BILITOT 1.22* 01/16/2015 1546   BILITOT 1.0 10/07/2014 1013   GFRNONAA >60 12/23/2014 1330   GFRAA >60 12/23/2014 1330    INo results found for: SPEP, UPEP  Lab Results    Component Value Date   WBC 8.7 01/16/2015   NEUTROABS 5.4 01/16/2015   HGB 14.6 01/16/2015   HCT 44.4 01/16/2015   MCV 90.2 01/16/2015   PLT 215 01/16/2015      Chemistry      Component Value Date/Time   NA 143 01/16/2015 1546   NA 143 12/23/2014 1330   K 3.9 01/16/2015 1546   K 4.2 12/23/2014 1330   CL 106 12/23/2014 1330   CO2 26 01/16/2015 1546   CO2 29 12/23/2014 1330   BUN 11.0 01/16/2015 1546   BUN 11 12/23/2014 1330   CREATININE 0.9 01/16/2015 1546   CREATININE 0.83 12/23/2014 1330      Component Value Date/Time   CALCIUM 9.9 01/16/2015 1546   CALCIUM 9.9 12/23/2014 1330   ALKPHOS 159* 01/16/2015 1546   ALKPHOS 122* 10/07/2014 1013   AST 47* 01/16/2015 1546   AST 23 10/07/2014 1013   ALT 45 01/16/2015 1546   ALT 20 10/07/2014 1013   BILITOT 1.22* 01/16/2015 1546   BILITOT 1.0 10/07/2014 1013       No results found for: LABCA2  No components found for: LABCA125  No results for input(s): INR in the last 168 hours.  Urinalysis    Component Value Date/Time   COLORURINE YELLOW 10/07/2014 1013   APPEARANCEUR CLEAR 10/07/2014 1013   LABSPEC 1.015 10/07/2014 1013   PHURINE 7.5 10/07/2014 1013   GLUCOSEU NEGATIVE 10/07/2014 1013   HGBUR NEGATIVE 10/07/2014 1013   BILIRUBINUR NEGATIVE 10/07/2014 1013   KETONESUR NEGATIVE 10/07/2014 1013   UROBILINOGEN 1.0 10/07/2014 1013   NITRITE NEGATIVE 10/07/2014 1013   LEUKOCYTESUR NEGATIVE 10/07/2014 1013    STUDIES: Mm Breast Surgical Specimen  12/28/2014  CLINICAL DATA:  Status post left lumpectomy. EXAM: SPECIMEN RADIOGRAPH OF THE LEFT BREAST COMPARISON:  Previous exam(s). FINDINGS: Status post excision of the left breast. The radioactive seed and biopsy marker clip are present, completely intact, and were marked for pathology. IMPRESSION: Specimen radiograph of the left breast. Electronically Signed   By: Fidela Salisbury M.D.   On: 12/28/2014 13:05   Mm Lt Radioactive Seed Loc Mammo Guide  12/23/2014   CLINICAL DATA:  Patient presents for mammographically guided radioactive seed localization of a left breast mass. EXAM: MAMMOGRAPHIC GUIDED RADIOACTIVE SEED LOCALIZATION OF THE LEFT BREAST COMPARISON:  Previous exam(s). FINDINGS: Patient presents for radioactive seed localization prior to surgical excision of a left breast mass. I met with the patient and we discussed the procedure of seed localization including benefits and alternatives. We discussed the high likelihood of a successful procedure. We discussed the risks of the procedure  including infection, bleeding, tissue injury and further surgery. We discussed the low dose of radioactivity involved in the procedure. Informed, written consent was given. The usual time-out protocol was performed immediately prior to the procedure. Using mammographic guidance, sterile technique, 2% lidocaine and an I-125 radioactive seed, the ribbon shaped biopsy clip was localized using a oblique lateral to medial approach. The follow-up mammogram images confirm the seed in the expected location and were marked for Dr. Marlou Starks. Follow-up survey of the patient confirms presence of the radioactive seed. Order number of I-125 seed:  [id number]. Total activity:  6.378 millicuries  Reference Date: 11/22/2014 The patient tolerated the procedure well and was released from the [facility]. She was given instructions regarding seed removal. IMPRESSION: Radioactive seed localization left breast. No apparent complications. Electronically Signed   By: Lajean Manes M.D.   On: 12/23/2014 13:30    ASSESSMENT: 59 y.o. [place] woman status post left lumpectomy 12/28/2014 for ductal carcinoma in situ, high-grade measuring 0.2 cm, estrogen and progesterone receptor positive, with close but negative margins  (1) adjuvant radiation pending  (2) to consider adjuvant anti-estrogens after local treatment  PLAN: We spent the better part of today's hour-long appointment discussing the  biology of breast cancer in general, and the specifics of the patient's tumor in particular. The patient understands that in noninvasive ductal carcinoma, also called ductal carcinoma in situ ("DCIS") the breast cancer cells remain trapped in the ducts were they started. They cannot travel to a vital organ. For that reason these cancers in themselves are not life-threatening.  If the whole breast is removed then all the ducts are removed and since the cancer cells are trapped in the ducts, the cure rate with mastectomy for noninvasive breast cancer is approximately 99%. Nevertheless we recommend lumpectomy, because there is no survival advantage to mastectomy and because the cosmetic result is generally superior with breast conservation.  We discussed the fact that one of her margins is very close, which increases the risk of local recurrence. We are not able to clear that without removing the implant and accordingly we strongly recommend radiation, which is in any case standard in this situation. She understands radiation will cut the risk of recurrence by more than half.  If after she completes her local treatment with surgery and radiation she takes anti-estrogens for 5 years, she will further cut her risk of recurrence in half. More importantly she will cut in half the risk of her developing a new breast cancer in either breast from a baseline of approximately 1% per year.  We also discussed her elevated liver function tests. We went back a total of 2 years and her on: Phosphatase and bilirubin have been up-and-down during that time. This is not suggestive of a progressive problem and it is certainly not going to be related to her noninvasive breast cancer. I gave her all that information and we will repeat a liver panel prior to her visit with me in February.  Leatha has a good understanding of the overall plan. She agrees with it. She knows the goal of treatment in her case is cure. She will call with  any problems that may develop before her next visit here.  Chauncey Cruel, MD   01/16/2015 5:31 PM Medical Oncology and Hematology [facility] [address] [place], [place] [address] Tel. [phone]    Fax. [phone]

## 2015-01-16 NOTE — Progress Notes (Signed)
Location of Breast Cancer: Left Breast Lower Outer   [place]  4 o'clock position  Histology per Pathology Report: Diagnosis 12/28/2014: Breast, lumpectomy, Left - DUCTAL CARCINOMA IN SITU, 0.2 CM.- DCIS LESS THAN 0.1 CM FROM POSTERIOR MARGIN.- BIOPSY SITE IDENTIFIED.  Diagnosis 11/09/2014: Breast, left, needle core biopsy, 4:00 o'clock - FIBROADIPOSE TISSUE- NEGATIVE FOR ATYPIA OR MALIGNANCY Receptor Status: ER(95%+), PR (30%+), Her2-neu ()  Did patient present with symptoms (if so, please note symptoms) or was this found on screening mammography?: Routine screening   Past/Anticipated interventions by surgeon, if any:Dr. Autumn Messing, III,,  Left breast  Lumpectomy  With radioactive seed localization 12/28/14 MD 01/18/15 f/u  appt  Past/Anticipated interventions by medical oncology, if any: Chemotherapy : Dr. Jana Hakim appt 01/16/15  Lymphedema issues, if any:  NO  Pain issues, if any:  NO   SAFETY ISSUES:NOPrior radiation? NO  Pacemaker/ICD? NO  Possible current pregnancy? NO  Is the patient on methotrexate? NO  Current Complaints / other details:  Menarache  Age 59 , G38P3, Oral contraceptives,  COPD, HTN Occasional alcohol use, former cigarette smoker, 1 ppd for 5-10 years quit  1991   No drug use has  Subpectoral [place] breast  implants    Paternal grandmother breast cancer,  No HRT,    BP 130/60 mmHg  Pulse 77  Temp(Src) 97.9 F (36.6 C) (Oral)  Resp 20  Ht $R'5\' 5"'qD$  (1.651 m)  Wt 200 lb 12.8 oz (91.082 kg)  BMI 33.41 kg/m2  Wt Readings from Last 3 Encounters:  01/18/15 200 lb 12.8 oz (91.082 kg)  01/16/15 199 lb 8 oz (90.493 kg)  12/28/14 196 lb (88.905 kg)   Rebecca Eaton, RN 01/16/2015,1:44 PM

## 2015-01-17 ENCOUNTER — Telehealth: Payer: Self-pay | Admitting: *Deleted

## 2015-01-17 ENCOUNTER — Telehealth: Payer: Self-pay | Admitting: Oncology

## 2015-01-17 NOTE — Telephone Encounter (Signed)
Called pt to discuss care plan and provide navigation resources. Pt denies questions or concerns regarding treatment care plan. Confirmed appt with Dr. Lisbeth Renshaw on 11/23. Encourage pt to call with needs. Received verbal understanding. Contact information provided.

## 2015-01-17 NOTE — Telephone Encounter (Signed)
Called patient's home #. Tlk to patient's husband. Pt's husband did not want to schedule patient's appt until her last treatment. Pt's husband stated that he would call us back then.       [facility].

## 2015-01-18 ENCOUNTER — Encounter: Payer: Self-pay | Admitting: Radiation Oncology

## 2015-01-18 ENCOUNTER — Ambulatory Visit
Admission: RE | Admit: 2015-01-18 | Discharge: 2015-01-18 | Disposition: A | Discharge status: Home / Self Care | Payer: BLUE CROSS/BLUE SHIELD | Source: Ambulatory Visit | Attending: Radiation Oncology | Admitting: Radiation Oncology

## 2015-01-18 ENCOUNTER — Telehealth: Payer: Self-pay | Admitting: Oncology

## 2015-01-18 VITALS — BP 130/60 | HR 77 | Temp 97.9°F | Resp 20 | Ht 65.0 in | Wt 200.8 lb

## 2015-01-18 DIAGNOSIS — E785 Hyperlipidemia, unspecified: Secondary | ICD-10-CM | POA: Diagnosis not present

## 2015-01-18 DIAGNOSIS — Z801 Family history of malignant neoplasm of trachea, bronchus and lung: Secondary | ICD-10-CM | POA: Insufficient documentation

## 2015-01-18 DIAGNOSIS — Z87891 Personal history of nicotine dependence: Secondary | ICD-10-CM | POA: Insufficient documentation

## 2015-01-18 DIAGNOSIS — K219 Gastro-esophageal reflux disease without esophagitis: Secondary | ICD-10-CM | POA: Diagnosis not present

## 2015-01-18 DIAGNOSIS — I10 Essential (primary) hypertension: Secondary | ICD-10-CM | POA: Insufficient documentation

## 2015-01-18 DIAGNOSIS — Z510 Encounter for antineoplastic radiation therapy: Secondary | ICD-10-CM | POA: Diagnosis present

## 2015-01-18 DIAGNOSIS — C50512 Malignant neoplasm of lower-outer quadrant of left female breast: Secondary | ICD-10-CM

## 2015-01-18 DIAGNOSIS — Z800 Family history of malignant neoplasm of digestive organs: Secondary | ICD-10-CM | POA: Insufficient documentation

## 2015-01-18 DIAGNOSIS — J4550 Severe persistent asthma, uncomplicated: Secondary | ICD-10-CM | POA: Insufficient documentation

## 2015-01-18 DIAGNOSIS — Z803 Family history of malignant neoplasm of breast: Secondary | ICD-10-CM | POA: Diagnosis not present

## 2015-01-18 DIAGNOSIS — Z170 Estrogen receptor positive status [ER+]: Secondary | ICD-10-CM | POA: Insufficient documentation

## 2015-01-18 HISTORY — DX: Malignant neoplasm of unspecified site of unspecified female breast: C50.919

## 2015-01-18 NOTE — Telephone Encounter (Signed)
no vm mailed pt appt sched/avs and letter for Feb 2017

## 2015-01-18 NOTE — Progress Notes (Signed)
Please see the Nurse Progress Note in the MD Initial Consult Encounter for this patient. 

## 2015-01-18 NOTE — Progress Notes (Signed)
Radiation Oncology         [phone] ________________________________  Name: Caitlin Wall MRN: [id number]  Date: 01/18/2015  DOB: May 07, 1955  ZY:YQMGN Jenny Reichmann, MD  Caitlin Kussmaul, MD     REFERRING PHYSICIAN: Autumn Messing III, MD   DIAGNOSIS: The primary encounter diagnosis was Malignant neoplasm of lower-outer quadrant of left female breast [facility]). A diagnosis of Breast cancer of lower-outer quadrant of left female breast [facility]) was also pertinent to this visit. Left breast DCIS, ER+, PR+, HER-2(-)  HISTORY OF PRESENT ILLNESS::Caitlin Wall is a 59 y.o. female who is seen for an initial consultation visit.    The patient had bilateral screening mammography at the [facility], October 25 2014. She has subpectoral saline implants in place. There was a possible asymmetry in the left breast and she was recalled for left diagnostic mammography with tomosynthesis and left breast ultrasonography 11/03/2014. The breast density was category C. In the left breast lower outer quadrant there was an irregular mass which was not palpable and which by ultrasonography measured 0.6 cm. The left axilla was sonographically benign. Biopsy of this mass 11/09/2014 showed ([id number]) fibroadipose adipose tissue. This was felt to be discordant.  Accordingly the patient was referred to surgery with Dr. Marlou Starks III and she underwent left breast radioactive seed localized lumpectomy 12/28/2014. The pathology from that procedure (([id number]) showed ductal carcinoma in situ, high-grade, measuring 0.2 cm. This was less than 0.1 cm from the posterior margin. The cells was estrogen receptor positive at 95%, with strong staining intensity, progesterone receptor positive at 30% with moderate staining intensity.  The patient completed lumpectomy on 12/28/2014. This returned positive for ductal carcinoma in situ measuring 0.2 cm. The DCIS was less than 0.1 cm from the posterior margin which represents the implant.  It was not felt that further tissue in this direction could be taken without removing the implant and this was not recommended therefore given this location. Receptor studies have indicated that the tumor is estrogen receptor positive and progesterone receptor positive.   PREVIOUS RADIATION THERAPY: No   PAST MEDICAL HISTORY:  has a past medical history of colonic polyps (1995); GERD (gastroesophageal reflux disease); Hyperlipidemia; Hypertension; COLONIC POLYPS, HX OF (11/04/2007); GERD (11/04/2007); HYPERLIPIDEMIA (11/04/2007); HYPERTENSION (06/07/2009); HAIR LOSS (11/04/2007); LBBB (left bundle branch block) (07/26/2011); Asthma, persistent not controlled (07/27/2012); and Breast cancer ([place]) (12/28/14).     PAST SURGICAL HISTORY: Past Surgical History  Procedure Laterality Date  . Bilat ear surgury/hearing loss    . Abdominal hysterectomy    . Anterior fusion cervical spine    . Breast lumpectomy with radioactive seed localization Left 12/28/2014    Procedure: BREAST LUMPECTOMY WITH RADIOACTIVE SEED LOCALIZATION;  Surgeon: Autumn Messing III, MD;  Location: [place];  Service: General;  Laterality: Left;     FAMILY HISTORY: family history includes Breast cancer in her maternal grandmother and other; Cancer in her paternal grandmother; Lung cancer in her father. There is no history of Coronary artery disease, Colon cancer, Pancreatic cancer, Rectal cancer, or Stomach cancer.   SOCIAL HISTORY:  reports that she quit smoking about 25 years ago. Her smoking use included Cigarettes. She has a 20 pack-year smoking history. She has never used smokeless tobacco. She reports that she does not drink alcohol or use illicit drugs.   ALLERGIES: Codeine   MEDICATIONS:  Current Outpatient Prescriptions  Medication Sig Dispense Refill  . aspirin 81 MG tablet Take 81 mg by mouth daily.      Marland Kitchen  ibuprofen (ADVIL,MOTRIN) 200 MG tablet Take 200 mg by mouth as needed.    Marland Kitchen losartan (COZAAR) 50 MG tablet  Take 1 tablet (50 mg total) by mouth daily. 90 tablet 3  . Omega-3 Fatty Acids (FISH OIL) 1000 MG CAPS Take 1,000 mg by mouth daily.     No current facility-administered medications for this encounter.     REVIEW OF SYSTEMS:  A 15 point review of systems is documented in the electronic medical record. This was obtained by the nursing staff. However, I reviewed this with the patient to discuss relevant findings and make appropriate changes.  Pertinent items are noted in HPI.   The patient lives between [place] and [place]. [place] Age 48. G3P3. Oral contraceptives. COPD. HTN Occasional alcohol use. Former cigarette smoker, 1 ppd for 5-10 years, quit 1991. No drug use. Has Subpectoral Saline breast implants. Paternal grandmother breast cancer. No HRT.  Accompanied by her husband today. States she is healing well without infection or any issues.    PHYSICAL EXAM:  height is 5' 5" (1.651 m) and weight is 200 lb 12.8 oz (91.082 kg). Her oral temperature is 97.9 F (36.6 C). Her blood pressure is 130/60 and her pulse is 77. Her respiration is 20.   General: Alert and oriented, in no acute distress HEENT: Head is normocephalic.  Neck: Neck is supple, no palpable cervical or supraclavicular lymphadenopathy. Heart: Regular in rate and rhythm with no murmurs, rubs, or gallops. Chest: Clear to auscultation bilaterally, with no rhonchi, wheezes, or rales. Abdomen: Soft, nontender, nondistended, with no rigidity or guarding. Breast: Well healing surgical incision is present laterally within the left breast. Otherwise no abnormalities and no axillary adenopathy on either side bilateral implants present  ECOG = 0  0 - Asymptomatic (Fully active, able to carry on all predisease activities without restriction)  1 - Symptomatic but completely ambulatory (Restricted in physically strenuous activity but ambulatory and able to carry out work of a light or sedentary nature. For example, light housework,  office work)  2 - Symptomatic, <50% in bed during the day (Ambulatory and capable of all self care but unable to carry out any work activities. Up and about more than 50% of waking hours)  3 - Symptomatic, >50% in bed, but not bedbound (Capable of only limited self-care, confined to bed or chair 50% or more of waking hours)  4 - Bedbound (Completely disabled. Cannot carry on any self-care. Totally confined to bed or chair)  5 - Death   Eustace Pen MM, Creech RH, Tormey DC, et al. [phone]). "Toxicity and response criteria of the [facility]". [place] Oncol. 5 (6): 649-55     LABORATORY DATA:  Lab Results  Component Value Date   WBC 8.7 01/16/2015   HGB 14.6 01/16/2015   HCT 44.4 01/16/2015   MCV 90.2 01/16/2015   PLT 215 01/16/2015   Lab Results  Component Value Date   NA 143 01/16/2015   K 3.9 01/16/2015   CL 106 12/23/2014   CO2 26 01/16/2015   Lab Results  Component Value Date   ALT 45 01/16/2015   AST 47* 01/16/2015   ALKPHOS 159* 01/16/2015   BILITOT 1.22* 01/16/2015      RADIOGRAPHY: Mm Breast Surgical Specimen  12/28/2014  CLINICAL DATA:  Status post left lumpectomy. EXAM: SPECIMEN RADIOGRAPH OF THE LEFT BREAST COMPARISON:  Previous exam(s). FINDINGS: Status post excision of the left breast. The radioactive seed and biopsy marker clip are present, completely  intact, and were marked for pathology. IMPRESSION: Specimen radiograph of the left breast. Electronically Signed   By: Fidela Salisbury M.D.   On: 12/28/2014 13:05   Mm Lt Radioactive Seed Loc Mammo Guide  12/23/2014  CLINICAL DATA:  Patient presents for mammographically guided radioactive seed localization of a left breast mass. EXAM: MAMMOGRAPHIC GUIDED RADIOACTIVE SEED LOCALIZATION OF THE LEFT BREAST COMPARISON:  Previous exam(s). FINDINGS: Patient presents for radioactive seed localization prior to surgical excision of a left breast mass. I met with the patient and we discussed the  procedure of seed localization including benefits and alternatives. We discussed the high likelihood of a successful procedure. We discussed the risks of the procedure including infection, bleeding, tissue injury and further surgery. We discussed the low dose of radioactivity involved in the procedure. Informed, written consent was given. The usual time-out protocol was performed immediately prior to the procedure. Using mammographic guidance, sterile technique, 2% lidocaine and an I-125 radioactive seed, the ribbon shaped biopsy clip was localized using a oblique lateral to medial approach. The follow-up mammogram images confirm the seed in the expected location and were marked for Dr. Marlou Starks. Follow-up survey of the patient confirms presence of the radioactive seed. Order number of I-125 seed:  [id number]. Total activity:  6.384 millicuries  Reference Date: 11/22/2014 The patient tolerated the procedure well and was released from the [facility]. She was given instructions regarding seed removal. IMPRESSION: Radioactive seed localization left breast. No apparent complications. Electronically Signed   By: Lajean Manes M.D.   On: 12/23/2014 13:30       IMPRESSION: Caitlin Wall is a pleasant 59 year old woman with left breast DCIS. She had a left breast lumpectomy performed on 12/28/14. I would recommend radiation therapy in the management of her disease. We will tentatively plan for 6.5 to 7 weeks of radiation. The patient did have a close margin and a greater boost to the surgical cavity may be appropriate for her.   PLAN: We discussed the side effects, benefits, and risks of radiation treatment to the left breast.  The patient will proceed with simulation and treatment planning next week and this will be scheduled by our staff according to her availability. Again, I would anticipate 7 weeks of radiation treatment.      ________________________________   Jodelle Gross, MD, PhD   **Disclaimer:  This note was dictated with voice recognition software. Similar sounding words can inadvertently be transcribed and this note may contain transcription errors which may not have been corrected upon publication of note.**  This document serves as a record of services personally performed by Kyung Rudd, MD. It was created on his behalf by Arlyce Harman, a trained medical scribe. The creation of this record is based on the scribe's personal observations and the provider's statements to them. This document has been checked and approved by the attending provider.

## 2015-01-25 ENCOUNTER — Ambulatory Visit
Admission: RE | Admit: 2015-01-25 | Discharge: 2015-01-25 | Disposition: A | Discharge status: Home / Self Care | Payer: BLUE CROSS/BLUE SHIELD | Source: Ambulatory Visit | Attending: Radiation Oncology | Admitting: Radiation Oncology

## 2015-01-25 DIAGNOSIS — C50512 Malignant neoplasm of lower-outer quadrant of left female breast: Secondary | ICD-10-CM

## 2015-01-25 DIAGNOSIS — Z510 Encounter for antineoplastic radiation therapy: Secondary | ICD-10-CM | POA: Diagnosis not present

## 2015-01-27 DIAGNOSIS — Z510 Encounter for antineoplastic radiation therapy: Secondary | ICD-10-CM | POA: Diagnosis not present

## 2015-01-27 NOTE — Progress Notes (Signed)
  Radiation Oncology         [phone]) [phone] ________________________________  Name: Caitlin Wall MRN: [id number]  Date: 01/25/2015  DOB: [birth year]  Optical Surface Tracking Plan:  Since intensity modulated radiotherapy (IMRT) and 3D conformal radiation treatment methods are predicated on accurate and precise positioning for treatment, intrafraction motion monitoring is medically necessary to ensure accurate and safe treatment delivery.  The ability to quantify intrafraction motion without excessive ionizing radiation dose can only be performed with optical surface tracking. Accordingly, surface imaging offers the opportunity to obtain 3D measurements of patient position throughout IMRT and 3D treatments without excessive radiation exposure.  I am ordering optical surface tracking for this patient's upcoming course of radiotherapy. ________________________________  Kyung Rudd, MD 01/27/2015 8:26 AM    Reference:   Particia Jasper, et al. Surface imaging-based analysis of intrafraction motion for breast radiotherapy patients.Journal of [place], n. 6, nov. 2014. ISSN [id number].   Available at: <http://www.jacmp.org/index.php/jacmp/article/view/4957>.

## 2015-01-27 NOTE — Progress Notes (Signed)
  Radiation Oncology         [phone]) [phone] ________________________________  Name: Caitlin Wall MRN: [id number]  Date: 01/25/2015  DOB: [birth year]  SIMULATION AND TREATMENT PLANNING NOTE  The patient presented for simulation prior to beginning her course of radiation treatment for her diagnosis of left-sided breast cancer. The patient was placed in a supine position on a breast board. A customized vac-lock bag was constructed and this complex treatment device will be used on a daily basis during her treatment. In this fashion, a CT scan was obtained through the chest area and an isocenter was placed near the chest wall within the breast.  The patient will be planned to receive a course of radiation initially to a dose of 50.4 Gy. This will consist of a whole breast radiotherapy technique. To accomplish this, 2 customized blocks have been designed which will correspond to medial and lateral whole breast tangent fields. This treatment will be accomplished at 1.8 Gy per fraction. A forward planning technique will also be evaluated to determine if this approach improves the plan. It is anticipated that the patient will then receive a 14 Gy boost to the seroma cavity which has been contoured. This will be accomplished at 2 Gy per fraction.   This initial treatment will consist of a 3-D conformal technique. The seroma has been contoured as the primary target structure. Additionally, dose volume histograms of both this target as well as the lungs and heart will also be evaluated. Such an approach is necessary to ensure that the target area is adequately covered while the nearby critical  normal structures are adequately spared.  Plan:  The final anticipated total dose therefore will correspond to 64.4 Gy.    Special treatment procedure Special treatment procedure was performed today due to the extra time and effort required by myself to plan and prepare this patient for deep inspiration breath  hold technique.  I have determined cardiac sparing to be of benefit to this patient to prevent long term cardiac damage due to radiation of the heart.  Bellows were placed on the patient's abdomen. To facilitate cardiac sparing, the patient was coached by the radiation therapists on breath hold techniques and breathing practice was performed. Practice waveforms were obtained. The patient was then scanned while maintaining breath hold in the treatment position.  This image was then transferred over to the imaging specialist. The imaging specialist then created a fusion of the free breathing and breath hold scans using the chest wall as the stable structure. I personally reviewed the fusion in axial, coronal and sagittal image planes.  Excellent cardiac sparing was obtained.  I felt the patient is an appropriate candidate for breath hold and the patient will be treated as such.  The image fusion was then reviewed with the patient to reinforce the necessity of reproducible breath hold.      _______________________________   Jodelle Gross, MD, PhD

## 2015-01-30 DIAGNOSIS — Z510 Encounter for antineoplastic radiation therapy: Secondary | ICD-10-CM | POA: Diagnosis not present

## 2015-02-01 ENCOUNTER — Ambulatory Visit
Admission: RE | Admit: 2015-02-01 | Discharge: 2015-02-01 | Disposition: A | Discharge status: Home / Self Care | Payer: BLUE CROSS/BLUE SHIELD | Source: Ambulatory Visit | Attending: Radiation Oncology | Admitting: Radiation Oncology

## 2015-02-01 DIAGNOSIS — Z510 Encounter for antineoplastic radiation therapy: Secondary | ICD-10-CM | POA: Diagnosis not present

## 2015-02-02 ENCOUNTER — Ambulatory Visit
Admission: RE | Admit: 2015-02-02 | Discharge: 2015-02-02 | Disposition: A | Discharge status: Home / Self Care | Payer: BLUE CROSS/BLUE SHIELD | Source: Ambulatory Visit | Attending: Radiation Oncology | Admitting: Radiation Oncology

## 2015-02-02 DIAGNOSIS — C50512 Malignant neoplasm of lower-outer quadrant of left female breast: Secondary | ICD-10-CM

## 2015-02-02 DIAGNOSIS — Z510 Encounter for antineoplastic radiation therapy: Secondary | ICD-10-CM | POA: Diagnosis not present

## 2015-02-02 MED ORDER — ALRA NON-METALLIC DEODORANT (RAD-ONC)
1.0000 "application " | Freq: Once | TOPICAL | Status: AC
  Administered 2015-02-02: 1 via TOPICAL

## 2015-02-02 MED ORDER — RADIAPLEXRX EX GEL
Freq: Once | CUTANEOUS | Status: AC
  Administered 2015-02-02: 15:00:00 via TOPICAL

## 2015-02-02 NOTE — Progress Notes (Signed)
Pt education  done, breast, rad book , alra deodorant,radiaplex cream ,my business card given to patient, discussed ways to manage side effects, skin irritation, breast swelling, tenderness, pain, fatigue, teach back given, increase protein in diet, drink plenty water stay hydrated, teach back given 3:19 PM

## 2015-02-03 ENCOUNTER — Ambulatory Visit
Admission: RE | Admit: 2015-02-03 | Discharge: 2015-02-03 | Disposition: A | Discharge status: Home / Self Care | Payer: BLUE CROSS/BLUE SHIELD | Source: Ambulatory Visit | Attending: Radiation Oncology | Admitting: Radiation Oncology

## 2015-02-03 ENCOUNTER — Encounter: Payer: Self-pay | Admitting: Radiation Oncology

## 2015-02-03 VITALS — BP 140/62 | HR 91 | Temp 98.4°F | Resp 18 | Ht 65.0 in | Wt 199.6 lb

## 2015-02-03 DIAGNOSIS — Z510 Encounter for antineoplastic radiation therapy: Secondary | ICD-10-CM | POA: Diagnosis not present

## 2015-02-03 DIAGNOSIS — C50512 Malignant neoplasm of lower-outer quadrant of left female breast: Secondary | ICD-10-CM

## 2015-02-03 NOTE — Progress Notes (Signed)
Caitlin Wall has completed 2 fractions to her left breast.  She denies pain and fatigue.  The skin on her left breast is intact.  She is using radiaplex gel.  BP 140/62 mmHg  Pulse 91  Temp(Src) 98.4 F (36.9 C) (Oral)  Resp 18  Ht 5\' 5"  (1.651 m)  Wt 199 lb 9.6 oz (90.538 kg)  BMI 33.22 kg/m2

## 2015-02-03 NOTE — Progress Notes (Signed)
  Radiation Oncology         [phone]     Name: Caitlin Wall MRN: [id number]   Date: 02/03/2015  DOB: [birth year]   Weekly Radiation Therapy Management    ICD-9-CM ICD-10-CM   1. Breast cancer of lower-outer quadrant of left female breast (HCC) 174.5 C50.512     Current Dose: 3.6 Gy  Planned Dose:  64.4 Gy  Narrative The patient presents for routine under treatment assessment.  Caitlin Wall has completed 2 fractions to her left breast. She denies pain and fatigue. The skin on her left breast is intact. She is using radiaplex gel.  The patient is without complaint. Set-up films were reviewed. The chart was checked.  Physical Findings  height is 5\' 5"  (1.651 m) and weight is 199 lb 9.6 oz (90.538 kg). Her oral temperature is 98.4 F (36.9 C). Her blood pressure is 140/62 and her pulse is 91. Her respiration is 18. . Weight essentially stable.  No significant changes.  Impression The patient is tolerating radiation.  Plan Continue treatment as planned.         Sheral Apley Tammi Klippel, M.D.  This document serves as a record of services personally performed by Tyler Pita, MD. It was created on his behalf by Arlyce Harman, a trained medical scribe. The creation of this record is based on the scribe's personal observations and the provider's statements to them. This document has been checked and approved by the attending provider.

## 2015-02-06 ENCOUNTER — Ambulatory Visit
Admission: RE | Admit: 2015-02-06 | Discharge: 2015-02-06 | Disposition: A | Discharge status: Home / Self Care | Payer: BLUE CROSS/BLUE SHIELD | Source: Ambulatory Visit | Attending: Radiation Oncology | Admitting: Radiation Oncology

## 2015-02-06 DIAGNOSIS — Z510 Encounter for antineoplastic radiation therapy: Secondary | ICD-10-CM | POA: Diagnosis not present

## 2015-02-07 ENCOUNTER — Ambulatory Visit
Admission: RE | Admit: 2015-02-07 | Discharge: 2015-02-07 | Disposition: A | Discharge status: Home / Self Care | Payer: BLUE CROSS/BLUE SHIELD | Source: Ambulatory Visit | Attending: Radiation Oncology | Admitting: Radiation Oncology

## 2015-02-07 DIAGNOSIS — Z510 Encounter for antineoplastic radiation therapy: Secondary | ICD-10-CM | POA: Diagnosis not present

## 2015-02-08 ENCOUNTER — Ambulatory Visit
Admission: RE | Admit: 2015-02-08 | Discharge: 2015-02-08 | Disposition: A | Discharge status: Home / Self Care | Payer: BLUE CROSS/BLUE SHIELD | Source: Ambulatory Visit | Attending: Radiation Oncology | Admitting: Radiation Oncology

## 2015-02-08 ENCOUNTER — Encounter: Payer: Self-pay | Admitting: Radiation Oncology

## 2015-02-08 VITALS — BP 155/85 | HR 80 | Temp 98.1°F | Resp 20 | Wt 199.5 lb

## 2015-02-08 DIAGNOSIS — Z510 Encounter for antineoplastic radiation therapy: Secondary | ICD-10-CM | POA: Diagnosis not present

## 2015-02-08 DIAGNOSIS — C50512 Malignant neoplasm of lower-outer quadrant of left female breast: Secondary | ICD-10-CM

## 2015-02-08 NOTE — Progress Notes (Signed)
Weekly rad txs left breast completed 5 txs, no skin changes, skin intact, using radiaplex bid, appetite good, no fatiue stated 4:27 PM BP 155/85 mmHg  Pulse 80  Temp(Src) 98.1 F (36.7 C) (Oral)  Resp 20  Wt 199 lb 8 oz (90.493 kg)  Wt Readings from Last 3 Encounters:  02/08/15 199 lb 8 oz (90.493 kg)  02/03/15 199 lb 9.6 oz (90.538 kg)  01/18/15 200 lb 12.8 oz (91.082 kg)

## 2015-02-08 NOTE — Progress Notes (Signed)
   Department of Radiation Oncology  Phone:  [phone] Fax:        [phone]  Weekly Treatment Note    [facility] Date: 02/08/2015 MRN: [id number] DOB: 04-10-1955   Diagnosis:     ICD-9-CM ICD-10-CM   1. Breast cancer of lower-outer quadrant of left female breast ([place]) 174.5 C50.512      Current dose: 9 Gy  Current fraction:5   MEDICATIONS: Current Outpatient Prescriptions  Medication Sig Dispense Refill  . aspirin 81 MG tablet Take 81 mg by mouth daily.      . hyaluronate sodium (RADIAPLEXRX) GEL Apply 1 application topically 2 (two) times daily.    Marland Kitchen ibuprofen (ADVIL,MOTRIN) 200 MG tablet Take 200 mg by mouth as needed.    Marland Kitchen losartan (COZAAR) 50 MG tablet Take 1 tablet (50 mg total) by mouth daily. 90 tablet 3  . non-metallic deodorant (ALRA) MISC Apply 1 application topically daily as needed.    . Omega-3 Fatty Acids (FISH OIL) 1000 MG CAPS Take 1,000 mg by mouth daily.     No current facility-administered medications for this encounter.     ALLERGIES: Codeine   LABORATORY DATA:  Lab Results  Component Value Date   WBC 8.7 01/16/2015   HGB 14.6 01/16/2015   HCT 44.4 01/16/2015   MCV 90.2 01/16/2015   PLT 215 01/16/2015   Lab Results  Component Value Date   NA 143 01/16/2015   K 3.9 01/16/2015   CL 106 12/23/2014   CO2 26 01/16/2015   Lab Results  Component Value Date   ALT 45 01/16/2015   AST 47* 01/16/2015   ALKPHOS 159* 01/16/2015   BILITOT 1.22* 01/16/2015     NARRATIVE: Caitlin Wall was seen today for weekly treatment management. The chart was checked and the patient's films were reviewed.  Weekly rad txs left breast completed 5 txs, no skin changes, skin intact, using radiaplex bid, appetite good, no fatiue stated 4:56 PM BP 155/85 mmHg  Pulse 80  Temp(Src) 98.1 F (36.7 C) (Oral)  Resp 20  Wt 199 lb 8 oz (90.493 kg)  Wt Readings from Last 3 Encounters:  02/08/15 199 lb 8 oz (90.493 kg)  02/03/15 199  lb 9.6 oz (90.538 kg)  01/18/15 200 lb 12.8 oz (91.082 kg)    PHYSICAL EXAMINATION: weight is 199 lb 8 oz (90.493 kg). Her oral temperature is 98.1 F (36.7 C). Her blood pressure is 155/85 and her pulse is 80. Her respiration is 20.        ASSESSMENT: The patient is doing satisfactorily with treatment.  PLAN: We will continue with the patient's radiation treatment as planned.

## 2015-02-09 ENCOUNTER — Telehealth: Payer: Self-pay | Admitting: *Deleted

## 2015-02-09 ENCOUNTER — Ambulatory Visit
Admission: RE | Admit: 2015-02-09 | Discharge: 2015-02-09 | Disposition: A | Discharge status: Home / Self Care | Payer: BLUE CROSS/BLUE SHIELD | Source: Ambulatory Visit | Attending: Radiation Oncology | Admitting: Radiation Oncology

## 2015-02-09 DIAGNOSIS — Z510 Encounter for antineoplastic radiation therapy: Secondary | ICD-10-CM | POA: Diagnosis not present

## 2015-02-09 NOTE — Telephone Encounter (Signed)
Called pt to assess needs during xrt. Relate doing well and without complaints. Encourage pt to call with questions or needs. Provided contact information.

## 2015-02-10 ENCOUNTER — Ambulatory Visit
Admission: RE | Admit: 2015-02-10 | Discharge: 2015-02-10 | Disposition: A | Discharge status: Home / Self Care | Payer: BLUE CROSS/BLUE SHIELD | Source: Ambulatory Visit | Attending: Radiation Oncology | Admitting: Radiation Oncology

## 2015-02-10 ENCOUNTER — Encounter: Payer: Self-pay | Admitting: Radiation Oncology

## 2015-02-10 VITALS — Wt 196.6 lb

## 2015-02-10 DIAGNOSIS — C50512 Malignant neoplasm of lower-outer quadrant of left female breast: Secondary | ICD-10-CM

## 2015-02-10 DIAGNOSIS — Z510 Encounter for antineoplastic radiation therapy: Secondary | ICD-10-CM | POA: Diagnosis not present

## 2015-02-10 NOTE — Progress Notes (Addendum)
Weekly rad txs left breast, no skin changes, using radiaplex bid, vitals done Wednesday, appetite good no pain .noqw Wt 196 lb 9.6 oz (89.177 kg) Wt Readings from Last 3 Encounters:  02/08/15 199 lb 8 oz (90.493 kg)  02/03/15 199 lb 9.6 oz (90.538 kg)  01/18/15 200 lb 12.8 oz (91.082 kg)

## 2015-02-10 NOTE — Progress Notes (Signed)
   Department of Radiation Oncology  Phone:  [phone] Fax:        [phone]  Weekly Treatment Note    Name: Caitlin Wall [facility] Date: 02/10/2015 MRN: [id number] DOB: [birth year]   Diagnosis:     ICD-9-CM ICD-10-CM   1. Breast cancer of lower-outer quadrant of left female breast ([facility]) 174.5 C50.512      Current dose: 12.6 Gy  Current fraction: 7   MEDICATIONS: Current Outpatient Prescriptions  Medication Sig Dispense Refill  . aspirin 81 MG tablet Take 81 mg by mouth daily.      . hyaluronate sodium (RADIAPLEXRX) GEL Apply 1 application topically 2 (two) times daily.    Marland Kitchen ibuprofen (ADVIL,MOTRIN) 200 MG tablet Take 200 mg by mouth as needed.    Marland Kitchen losartan (COZAAR) 50 MG tablet Take 1 tablet (50 mg total) by mouth daily. 90 tablet 3  . non-metallic deodorant (ALRA) MISC Apply 1 application topically daily as needed.    . Omega-3 Fatty Acids (FISH OIL) 1000 MG CAPS Take 1,000 mg by mouth daily.     No current facility-administered medications for this encounter.     ALLERGIES: Codeine   LABORATORY DATA:  Lab Results  Component Value Date   WBC 8.7 01/16/2015   HGB 14.6 01/16/2015   HCT 44.4 01/16/2015   MCV 90.2 01/16/2015   PLT 215 01/16/2015   Lab Results  Component Value Date   NA 143 01/16/2015   K 3.9 01/16/2015   CL 106 12/23/2014   CO2 26 01/16/2015   Lab Results  Component Value Date   ALT 45 01/16/2015   AST 47* 01/16/2015   ALKPHOS 159* 01/16/2015   BILITOT 1.22* 01/16/2015     NARRATIVE: Caitlin Wall was seen today for weekly treatment management. The chart was checked and the patient's films were reviewed.  She has no skin changes. She is using radiaplex bid. Her vitals were acquired last Wednesday. Denies pain and has a good appetite.  6:20 PM Wt 196 lb 9.6 oz (89.177 kg)  Wt Readings from Last 3 Encounters:  02/10/15 196 lb 9.6 oz (89.177 kg)  02/08/15 199 lb 8 oz (90.493 kg)  02/03/15 199 lb 9.6 oz (90.538 kg)     PHYSICAL EXAMINATION: weight is 196 lb 9.6 oz (89.177 kg).   Alert and in no acute distress.  ASSESSMENT: The patient is doing satisfactorily with treatment.  PLAN: We will continue with the patient's radiation treatment as planned.   This document serves as a record of services personally performed by Kyung Rudd, MD. It was created on his behalf by Darcus Austin, a trained medical scribe. The creation of this record is based on the scribe's personal observations and the provider's statements to them. This document has been checked and approved by the attending provider.

## 2015-02-13 ENCOUNTER — Ambulatory Visit
Admission: RE | Admit: 2015-02-13 | Discharge: 2015-02-13 | Disposition: A | Discharge status: Home / Self Care | Payer: BLUE CROSS/BLUE SHIELD | Source: Ambulatory Visit | Attending: Radiation Oncology | Admitting: Radiation Oncology

## 2015-02-13 DIAGNOSIS — Z510 Encounter for antineoplastic radiation therapy: Secondary | ICD-10-CM | POA: Diagnosis not present

## 2015-02-14 ENCOUNTER — Ambulatory Visit
Admission: RE | Admit: 2015-02-14 | Discharge: 2015-02-14 | Disposition: A | Discharge status: Home / Self Care | Payer: BLUE CROSS/BLUE SHIELD | Source: Ambulatory Visit | Attending: Radiation Oncology | Admitting: Radiation Oncology

## 2015-02-14 DIAGNOSIS — Z510 Encounter for antineoplastic radiation therapy: Secondary | ICD-10-CM | POA: Diagnosis not present

## 2015-02-15 ENCOUNTER — Ambulatory Visit
Admission: RE | Admit: 2015-02-15 | Discharge: 2015-02-15 | Disposition: A | Discharge status: Home / Self Care | Payer: BLUE CROSS/BLUE SHIELD | Source: Ambulatory Visit | Attending: Radiation Oncology | Admitting: Radiation Oncology

## 2015-02-15 ENCOUNTER — Ambulatory Visit (INDEPENDENT_AMBULATORY_CARE_PROVIDER_SITE_OTHER): Payer: BLUE CROSS/BLUE SHIELD | Admitting: Internal Medicine

## 2015-02-15 ENCOUNTER — Ambulatory Visit (INDEPENDENT_AMBULATORY_CARE_PROVIDER_SITE_OTHER)
Admission: RE | Admit: 2015-02-15 | Discharge: 2015-02-15 | Disposition: A | Discharge status: Home / Self Care | Payer: BLUE CROSS/BLUE SHIELD | Source: Ambulatory Visit | Attending: Internal Medicine | Admitting: Internal Medicine

## 2015-02-15 ENCOUNTER — Encounter: Payer: Self-pay | Admitting: Internal Medicine

## 2015-02-15 VITALS — BP 118/76 | HR 73 | Ht 65.0 in | Wt 194.6 lb

## 2015-02-15 DIAGNOSIS — C50512 Malignant neoplasm of lower-outer quadrant of left female breast: Secondary | ICD-10-CM | POA: Diagnosis not present

## 2015-02-15 DIAGNOSIS — R918 Other nonspecific abnormal finding of lung field: Secondary | ICD-10-CM

## 2015-02-15 DIAGNOSIS — Z510 Encounter for antineoplastic radiation therapy: Secondary | ICD-10-CM | POA: Diagnosis not present

## 2015-02-15 DIAGNOSIS — J3089 Other allergic rhinitis: Secondary | ICD-10-CM

## 2015-02-15 DIAGNOSIS — J309 Allergic rhinitis, unspecified: Secondary | ICD-10-CM

## 2015-02-15 DIAGNOSIS — J302 Other seasonal allergic rhinitis: Secondary | ICD-10-CM

## 2015-02-15 DIAGNOSIS — J4520 Mild intermittent asthma, uncomplicated: Secondary | ICD-10-CM | POA: Diagnosis not present

## 2015-02-15 NOTE — Assessment & Plan Note (Signed)
Asymptomatic. Plan-update chest x-ray

## 2015-02-15 NOTE — Assessment & Plan Note (Signed)
Tolerating current seasonal weather changes without problem. OTC meds sufficient when needed.

## 2015-02-15 NOTE — Progress Notes (Signed)
Subjective:    Patient ID: Caitlin Wall, female    DOB: 07/25/1955, 59 y.o.   MRN: [id number]  HPI 08/31/12- 67 yoF former smoker referred courtesy of Dr Jenny Reichmann for asthma.  Husband here.  pt reports diff breathing w light activity x 5 months-- currently taking advair and albuterol which provides some improvement.Remote smoker with no hx of lung disease. An oil lamp burned smokey in the home 5 months ago. About then, she began noting unusual dyspnea at rest and with exertion. No cough or wheeze, chest pain or palpitation, edema or infection. Feels substernal tightness w/o radiation or clear exertion trigger. No hx anemia or cardiac event. No hx of significant allergy or asthma. Home- older house, crawl space, CA, indoor dogs, Gas heat. House sustained significant water damage years ago, but no obvious mold/ mildew now. Smoked one pack per day x20 years, stopping in 1991 CXR 07/27/12- IMPRESSION:  No evidence for acute cardiopulmonary abnormality.  Original Report Authenticated By: Nolon Nations, M.D. PFT 08/11/12- very mild obstructive airways disease in small airways, with response to bronchodilator. Normal lung volumes and diffusion. FVC 3.59/104%, FEV1 2.70/100%, FEV1/FVC 0.75, FEF 25-75% 2.35/92% TLC 98%, DLCO 88%.  10/02/12- 65 yoF former smoker referred courtesy of Dr Jenny Reichmann for asthma.  Husband here. FOLLOWS FOR: Pt reports that breathing has improved but she has yet to reach her base line.  D-dimer at last ov had returned elevated. Evaluating for her  CC of dyspnea, we got CTa- abnormal as below.  Had had a bite on her finger treated at urgent care with doxycycline  And Keflex x2 weeks We called in Biaxin for 10 days on July 11 Still dyspnea on exertion, but at rest breathing is better and she is not coughing. Denies fever sweats or adenopathy. CT chest 09/04/12-  IMPRESSION:  1. Negative for pulmonary embolism.  2. Consolidation in the right upper lobe is likely infectious or   inflammatory. Short-term follow-up chest radiograph recommended to  ensure clearing.  3. 17mm right upper lobe nodule. Given risk factors for  bronchogenic carcinoma (smoking history), follow-up chest CT at 3-6  months is recommended. This recommendation follows the consensus  statement: Guidelines for Management of Small Pulmonary Nodules  Detected on CT Scans: A Statement from the [place] as  published in Radiology 2005; 237:395-400.  Original Report Authenticated By: Jorje Guild  02/01/13- 57 yoF former smoker referred courtesy of Dr Jenny Reichmann for asthma, complicated by hx pneumonia.  Husband here. FOLLOWS FOR: continues to have SOB; review CT chest with patient in detail Feels well. She denies routine cough, sweats or chills. Does not cough with swallowing. No choking events in sleep to suggest reflux. Hoarseness comes and goes. Occasional minor postnasal drip treated rescue inhaler made her tearful. When short of breath she hurts in mid back, either with exertion or exposure to strong odors. Husband points out that this complaint of back pain is long-standing and stable, noted before her respiratory complaints. Continues Advair. CT chest 01/29/13 IMPRESSION:  1. Interval decrease in size of previously described right upper  lobe pulmonary nodule now measuring 4 mm.  2. Resolution of previously visualized right upper lobe  consolidative pulmonary opacities most compatible with resolved  infectious process. Additionally there is a new irregular area of  ground-glass and consolidative opacity within the right upper lobe  which may represent an additional infectious/ inflammatory process.  3. Given the above findings, a followup chest CT in 6 months is  recommended to ensure  complete resolution of the pulmonary nodule as  well as the adjacent consolidative process.  Electronically Signed  By: Lovey Newcomer M.D.  On: 01/29/2013 17:54  08/02/13- 7 yoF former smoker referred courtesy  of Dr Jenny Reichmann for asthma, hx lung nodules complicated by hx pneumonia.  Husband here. FOLLOWS FOR: Pt states with moderate activity she becomes SOB. C/o mild mild cough with intermittent clear mucous production. Denies CP/tightness.  Still occasional shortness of breath with exertion affected by weather. Scant dry cough. No postnasal drip or reflux. This  02/07/14- 58 yoF former smoker followed  for asthma, hx lung nodules complicated by hx pneumonia.  Husband here. FOLLOWS FOR: SOB and wheezing with activity, unchanged and noticed mainly. Little night sweat. No trend. We discussed emphysema and small nodules which wax and wane. She thinks she bruises more easily on aspirin plus Breo inhaler AFB 08/13/13- Neg CT chest 08/04/13 IMPRESSION: Waxing and waning pulmonary nodular densities in the upper lobes, suggestive of a chronic infectious process, possibly mycobacterial in origin. Electronically Signed  By: Lorin Picket M.D.  On: 08/04/2013 16:45  06/09/14- 26 yoF former smoker followed  for asthma, hx lung nodules complicated by hx pneumonia.  Husband here. FOLLOWS FOR: Pt reports breathing is the same since last OV- no incr SOB and wheezing. Pt has been doing well without Breo. Still taking Incruse.   Feels "fine" and denies cough, wheeze, night sweats. Using daily Incruse inhaler, but not sure she needs it.  02/15/2015-59 year old female former smoker followed for asthma, history of lung nodules complicated by history pneumonia,, L breast cancer/ seeds, HBP FOLLOWS FOR: Pt states she only has coughing with exertion. Otherwise doing well. Some shortness of breath occasionally with exertion, stable. 4 total in no cough and no  inhaled medications  S-see HPI Constitutional:   No-   weight loss, night sweats, fevers, chills, fatigue, lassitude. HEENT:   No-  headaches, difficulty swallowing, tooth/dental problems, sore throat,       No-  sneezing, itching, ear ache, nasal congestion, post  nasal drip,  CV:  No-   chest pain, orthopnea, PND, swelling in lower extremities, anasarca,   dizziness, palpitations Resp: +  shortness of breath with exertion or at rest.              No-   productive cough,   non-productive cough,  No- coughing up of blood.              No-   change in color of mucus.  No- wheezing.   Skin: No-   rash or lesions. GI:  No-   heartburn, indigestion, abdominal pain, nausea, vomiting, GU:  MS:  No-   joint pain or swelling.  + back pain Neuro-     nothing unusual Psych:  No- change in mood or affect. No depression or anxiety.  No memory loss.    Objective:   Physical Exam General- Alert, Oriented, Affect-appropriate, Distress- none acute. Overweight. Skin- rash-none, lesions- none, excoriation- none Lymphadenopathy- none Head- atraumatic            Eyes- Gross vision intact, PERRLA, conjunctivae and secretions clear            Ears- +Hearing aid            Nose- Clear, no-Septal dev, mucus, polyps, erosion, perforation             Throat- Mallampati II , mucosa clear , drainage- none, tonsils- atrophic Neck- flexible , trachea midline, no stridor ,  thyroid nl, carotid no bruit Chest - symmetrical excursion , unlabored           Heart/CV- RRR , no murmur , no gallop  , no rub, nl s1 s2                           - JVD- none , edema- none, stasis changes- none, varices- none           Lung- clear, wheeze- none, cough- none , dullness-none, rub- none           Chest wall-  Abd-  Br/ Gen/ Rectal- Not done, not indicated Extrem- cyanosis- none, clubbing, none, atrophy- none, strength- nl Neuro- grossly intact to observation Assessment & Plan:

## 2015-02-15 NOTE — Assessment & Plan Note (Signed)
She is really doing quite well from respiratory standpoint. Because of smoking history and recent diagnosis of breast cancer, we will update chest x-ray

## 2015-02-15 NOTE — Patient Instructions (Signed)
Order- CXR   Dx hx lung nodules, left breast Ca  Please call as needed

## 2015-02-16 ENCOUNTER — Ambulatory Visit
Admission: RE | Admit: 2015-02-16 | Discharge: 2015-02-16 | Disposition: A | Discharge status: Home / Self Care | Payer: BLUE CROSS/BLUE SHIELD | Source: Ambulatory Visit | Attending: Radiation Oncology | Admitting: Radiation Oncology

## 2015-02-16 ENCOUNTER — Telehealth: Payer: Self-pay | Admitting: Internal Medicine

## 2015-02-16 DIAGNOSIS — Z510 Encounter for antineoplastic radiation therapy: Secondary | ICD-10-CM | POA: Diagnosis not present

## 2015-02-16 NOTE — Telephone Encounter (Signed)
Pt returning call and can be reached on work # [phone] or cell # [phone].Caitlin Wall

## 2015-02-16 NOTE — Telephone Encounter (Signed)
Spoke with pt, aware of results/recs.  Nothing further needed.  

## 2015-02-16 NOTE — Telephone Encounter (Signed)
Attempted to contact patient back regarding her CXR results. Phone rang, no answer, then rang busy.  wcb.   Notes Recorded by Deneise Lever, MD on 02/15/2015 at 4:06 PM CXR- CXR clear and stable. Old scar upeer right lung is unchanged.

## 2015-02-17 ENCOUNTER — Ambulatory Visit
Admission: RE | Admit: 2015-02-17 | Discharge: 2015-02-17 | Disposition: A | Discharge status: Home / Self Care | Payer: BLUE CROSS/BLUE SHIELD | Source: Ambulatory Visit | Attending: Radiation Oncology | Admitting: Radiation Oncology

## 2015-02-17 ENCOUNTER — Encounter: Payer: Self-pay | Admitting: Radiation Oncology

## 2015-02-17 VITALS — BP 124/83 | HR 74 | Temp 98.4°F | Resp 20 | Ht 65.5 in | Wt 197.5 lb

## 2015-02-17 DIAGNOSIS — C50512 Malignant neoplasm of lower-outer quadrant of left female breast: Secondary | ICD-10-CM

## 2015-02-17 DIAGNOSIS — Z510 Encounter for antineoplastic radiation therapy: Secondary | ICD-10-CM | POA: Diagnosis not present

## 2015-02-17 NOTE — Progress Notes (Signed)
Weekly rad txs  Left breast  Mild erythema only,skin intact, using radiaplex bid no c/o pain , no fatigue 1:09 PM BP 124/83 mmHg  Pulse 74  Temp(Src) 98.4 F (36.9 C) (Oral)  Resp 20  Ht 5' 5.5" (1.664 m)  Wt 197 lb 8 oz (89.585 kg)  BMI 32.35 kg/m2  Wt Readings from Last 3 Encounters:  02/17/15 197 lb 8 oz (89.585 kg)  02/15/15 194 lb 9.6 oz (88.27 kg)  02/10/15 196 lb 9.6 oz (89.177 kg)

## 2015-02-17 NOTE — Progress Notes (Signed)
   Department of Radiation Oncology  Phone:  [phone] Fax:        [phone]  Weekly Treatment Note    Name: Caitlin Wall [facility] Date: 02/17/2015 MRN: [id number] DOB: 1955-08-18   Diagnosis:     ICD-9-CM ICD-10-CM   1. Breast cancer of lower-outer quadrant of left female breast (HCC) 174.5 C50.512      Current dose: 21.6 Gy  Current fraction: 12   MEDICATIONS: Current Outpatient Prescriptions  Medication Sig Dispense Refill  . aspirin 81 MG tablet Take 81 mg by mouth daily.      . hyaluronate sodium (RADIAPLEXRX) GEL Apply 1 application topically 2 (two) times daily.    Marland Kitchen ibuprofen (ADVIL,MOTRIN) 200 MG tablet Take 200 mg by mouth as needed.    Marland Kitchen losartan (COZAAR) 50 MG tablet Take 1 tablet (50 mg total) by mouth daily. 90 tablet 3  . non-metallic deodorant (ALRA) MISC Apply 1 application topically daily as needed.    . Omega-3 Fatty Acids (FISH OIL) 1000 MG CAPS Take 1,000 mg by mouth daily.     No current facility-administered medications for this encounter.     ALLERGIES: Codeine   LABORATORY DATA:  Lab Results  Component Value Date   WBC 8.7 01/16/2015   HGB 14.6 01/16/2015   HCT 44.4 01/16/2015   MCV 90.2 01/16/2015   PLT 215 01/16/2015   Lab Results  Component Value Date   NA 143 01/16/2015   K 3.9 01/16/2015   CL 106 12/23/2014   CO2 26 01/16/2015   Lab Results  Component Value Date   ALT 45 01/16/2015   AST 47* 01/16/2015   ALKPHOS 159* 01/16/2015   BILITOT 1.22* 01/16/2015     NARRATIVE: Caitlin Wall was seen today for weekly treatment management. The chart was checked and the patient's films were reviewed.  Left breast has some mild erythema. Skin intact. Using radiaplex bid. No complains of pain or fatigue.  6:06 PM BP 124/83 mmHg  Pulse 74  Temp(Src) 98.4 F (36.9 C) (Oral)  Resp 20  Ht 5' 5.5" (1.664 m)  Wt 197 lb 8 oz (89.585 kg)  BMI 32.35 kg/m2  Wt Readings from Last 3 Encounters:  02/17/15 197 lb 8 oz  (89.585 kg)  02/15/15 194 lb 9.6 oz (88.27 kg)  02/10/15 196 lb 9.6 oz (89.177 kg)    PHYSICAL EXAMINATION: height is 5' 5.5" (1.664 m) and weight is 197 lb 8 oz (89.585 kg). Her oral temperature is 98.4 F (36.9 C). Her blood pressure is 124/83 and her pulse is 74. Her respiration is 20.   Alert and in no acute distress. Mild erythema in the treatment area and skins looks good at this point.  ASSESSMENT: The patient is doing satisfactorily with treatment.  PLAN: We will continue with the patient's radiation treatment as planned.   This document serves as a record of services personally performed by Kyung Rudd, MD. It was created on his behalf by Darcus Austin, a trained medical scribe. The creation of this record is based on the scribe's personal observations and the provider's statements to them. This document has been checked and approved by the attending provider.

## 2015-02-21 ENCOUNTER — Ambulatory Visit
Admission: RE | Admit: 2015-02-21 | Discharge: 2015-02-21 | Disposition: A | Discharge status: Home / Self Care | Payer: BLUE CROSS/BLUE SHIELD | Source: Ambulatory Visit | Attending: Radiation Oncology | Admitting: Radiation Oncology

## 2015-02-21 DIAGNOSIS — Z510 Encounter for antineoplastic radiation therapy: Secondary | ICD-10-CM | POA: Diagnosis not present

## 2015-02-22 ENCOUNTER — Ambulatory Visit
Admission: RE | Admit: 2015-02-22 | Discharge: 2015-02-22 | Disposition: A | Discharge status: Home / Self Care | Payer: BLUE CROSS/BLUE SHIELD | Source: Ambulatory Visit | Attending: Radiation Oncology | Admitting: Radiation Oncology

## 2015-02-22 DIAGNOSIS — Z510 Encounter for antineoplastic radiation therapy: Secondary | ICD-10-CM | POA: Diagnosis not present

## 2015-02-23 ENCOUNTER — Ambulatory Visit
Admission: RE | Admit: 2015-02-23 | Discharge: 2015-02-23 | Disposition: A | Discharge status: Home / Self Care | Payer: BLUE CROSS/BLUE SHIELD | Source: Ambulatory Visit | Attending: Radiation Oncology | Admitting: Radiation Oncology

## 2015-02-23 DIAGNOSIS — Z510 Encounter for antineoplastic radiation therapy: Secondary | ICD-10-CM | POA: Diagnosis not present

## 2015-02-24 ENCOUNTER — Encounter: Payer: Self-pay | Admitting: Radiation Oncology

## 2015-02-24 ENCOUNTER — Ambulatory Visit
Admission: RE | Admit: 2015-02-24 | Discharge: 2015-02-24 | Disposition: A | Discharge status: Home / Self Care | Payer: BLUE CROSS/BLUE SHIELD | Source: Ambulatory Visit | Attending: Radiation Oncology | Admitting: Radiation Oncology

## 2015-02-24 VITALS — BP 146/83 | HR 87 | Temp 98.4°F | Ht 65.5 in | Wt 199.5 lb

## 2015-02-24 DIAGNOSIS — C50512 Malignant neoplasm of lower-outer quadrant of left female breast: Secondary | ICD-10-CM | POA: Diagnosis present

## 2015-02-24 DIAGNOSIS — Z510 Encounter for antineoplastic radiation therapy: Secondary | ICD-10-CM | POA: Diagnosis not present

## 2015-02-24 MED ORDER — SONAFINE EX EMUL
1.0000 "application " | Freq: Two times a day (BID) | CUTANEOUS | Status: DC
  Administered 2015-02-24: 1 via TOPICAL

## 2015-02-24 NOTE — Progress Notes (Addendum)
Caitlin Wall has received 16 fractions to her left breast.  She denies any pain, but note "small bumps" in the upper, inner quadrant of the treated breast.  Given Sonafine.  No fatigue

## 2015-02-24 NOTE — Progress Notes (Signed)
  Radiation Oncology         [phone]     Name: Caitlin Wall MRN: [id number]   Date: 02/24/2015  DOB: [birth year]   Weekly Radiation Therapy Management    ICD-9-CM ICD-10-CM   1. Breast cancer of lower-outer quadrant of left female breast (HCC) 174.5 C50.512     Current Dose: 28.8 Gy  Planned Dose:  64.4 Gy  Narrative The patient presents for routine under treatment assessment.  [name] has received 16 fractions to her left breast. She denies any pain, but note "small bumps" in the upper, inner quadrant of the treated breast in the solar exposed breast. She states she has no fatigue.  The patient is without complaint. Set-up films were reviewed. The chart was checked.  Physical Findings  height is 5' 5.5" (1.664 m) and weight is 199 lb 8 oz (90.493 kg). Her temperature is 98.4 F (36.9 C). Her blood pressure is 146/83 and her pulse is 87. . Weight essentially stable.  No significant changes.  Impression The patient is tolerating radiation.  Plan Continue treatment as planned. I recommend that she consider hydrocortisone on the bumpy UIQ solar exposed areas.         Sheral Apley Tammi Klippel, M.D.  This document serves as a record of services personally performed by Tyler Pita, MD. It was created on his behalf by Lendon Collar, a trained medical scribe. The creation of this record is based on the scribe's personal observations and the provider's statements to them. This document has been checked and approved by the attending provider.

## 2015-02-24 NOTE — Addendum Note (Signed)
Encounter addended by: Benn Moulder, RN on: 02/24/2015  6:52 PM<BR>     Documentation filed: Notes Section, Orders, Dx Association, Inpatient [facility]

## 2015-02-28 ENCOUNTER — Ambulatory Visit
Admission: RE | Admit: 2015-02-28 | Discharge: 2015-02-28 | Disposition: A | Discharge status: Home / Self Care | Payer: BLUE CROSS/BLUE SHIELD | Source: Ambulatory Visit | Attending: Radiation Oncology | Admitting: Radiation Oncology

## 2015-02-28 DIAGNOSIS — Z510 Encounter for antineoplastic radiation therapy: Secondary | ICD-10-CM | POA: Diagnosis not present

## 2015-03-01 ENCOUNTER — Ambulatory Visit
Admission: RE | Admit: 2015-03-01 | Discharge: 2015-03-01 | Disposition: A | Discharge status: Home / Self Care | Payer: BLUE CROSS/BLUE SHIELD | Source: Ambulatory Visit | Attending: Radiation Oncology | Admitting: Radiation Oncology

## 2015-03-01 DIAGNOSIS — Z510 Encounter for antineoplastic radiation therapy: Secondary | ICD-10-CM | POA: Diagnosis not present

## 2015-03-02 ENCOUNTER — Ambulatory Visit
Admission: RE | Admit: 2015-03-02 | Discharge: 2015-03-02 | Disposition: A | Discharge status: Home / Self Care | Payer: BLUE CROSS/BLUE SHIELD | Source: Ambulatory Visit | Attending: Radiation Oncology | Admitting: Radiation Oncology

## 2015-03-02 DIAGNOSIS — Z510 Encounter for antineoplastic radiation therapy: Secondary | ICD-10-CM | POA: Diagnosis not present

## 2015-03-03 ENCOUNTER — Ambulatory Visit
Admission: RE | Admit: 2015-03-03 | Discharge: 2015-03-03 | Disposition: A | Discharge status: Home / Self Care | Payer: BLUE CROSS/BLUE SHIELD | Source: Ambulatory Visit | Attending: Radiation Oncology | Admitting: Radiation Oncology

## 2015-03-03 ENCOUNTER — Encounter: Payer: Self-pay | Admitting: Radiation Oncology

## 2015-03-03 DIAGNOSIS — Z510 Encounter for antineoplastic radiation therapy: Secondary | ICD-10-CM | POA: Diagnosis not present

## 2015-03-03 NOTE — Progress Notes (Signed)
Caitlin Wall has completed 20 fractions to her left breast.  She denies pain and fatigue.  The skin on her left breast is red.  She is using radiaplex gel.  BP 135/64 mmHg  Pulse 78  Temp(Src) 98.8 F (37.1 C) (Oral)  Ht 5' 5.5" (1.664 m)  Wt 195 lb 11.2 oz (88.769 kg)  BMI 32.06 kg/m2

## 2015-03-06 ENCOUNTER — Ambulatory Visit
Admission: RE | Admit: 2015-03-06 | Discharge: 2015-03-06 | Disposition: A | Discharge status: Home / Self Care | Payer: BLUE CROSS/BLUE SHIELD | Source: Ambulatory Visit | Attending: Radiation Oncology | Admitting: Radiation Oncology

## 2015-03-06 DIAGNOSIS — Z510 Encounter for antineoplastic radiation therapy: Secondary | ICD-10-CM | POA: Diagnosis not present

## 2015-03-07 ENCOUNTER — Ambulatory Visit
Admission: RE | Admit: 2015-03-07 | Discharge: 2015-03-07 | Disposition: A | Discharge status: Home / Self Care | Payer: BLUE CROSS/BLUE SHIELD | Source: Ambulatory Visit | Attending: Radiation Oncology | Admitting: Radiation Oncology

## 2015-03-07 DIAGNOSIS — Z510 Encounter for antineoplastic radiation therapy: Secondary | ICD-10-CM | POA: Diagnosis not present

## 2015-03-08 ENCOUNTER — Ambulatory Visit
Admission: RE | Admit: 2015-03-08 | Discharge: 2015-03-08 | Disposition: A | Discharge status: Home / Self Care | Payer: BLUE CROSS/BLUE SHIELD | Source: Ambulatory Visit | Attending: Radiation Oncology | Admitting: Radiation Oncology

## 2015-03-08 DIAGNOSIS — Z510 Encounter for antineoplastic radiation therapy: Secondary | ICD-10-CM | POA: Diagnosis not present

## 2015-03-09 ENCOUNTER — Ambulatory Visit
Admission: RE | Admit: 2015-03-09 | Discharge: 2015-03-09 | Disposition: A | Discharge status: Home / Self Care | Payer: BLUE CROSS/BLUE SHIELD | Source: Ambulatory Visit | Attending: Radiation Oncology | Admitting: Radiation Oncology

## 2015-03-09 ENCOUNTER — Encounter: Payer: Self-pay | Admitting: Radiation Oncology

## 2015-03-09 DIAGNOSIS — Z510 Encounter for antineoplastic radiation therapy: Secondary | ICD-10-CM | POA: Diagnosis not present

## 2015-03-10 ENCOUNTER — Ambulatory Visit
Admission: RE | Admit: 2015-03-10 | Discharge: 2015-03-10 | Disposition: A | Discharge status: Home / Self Care | Payer: BLUE CROSS/BLUE SHIELD | Source: Ambulatory Visit | Attending: Radiation Oncology | Admitting: Radiation Oncology

## 2015-03-10 ENCOUNTER — Encounter: Payer: Self-pay | Admitting: Radiation Oncology

## 2015-03-10 VITALS — BP 130/70 | HR 73 | Temp 98.5°F | Ht 65.5 in | Wt 196.0 lb

## 2015-03-10 DIAGNOSIS — Z510 Encounter for antineoplastic radiation therapy: Secondary | ICD-10-CM | POA: Diagnosis not present

## 2015-03-10 DIAGNOSIS — C50512 Malignant neoplasm of lower-outer quadrant of left female breast: Secondary | ICD-10-CM

## 2015-03-10 NOTE — Progress Notes (Signed)
Caitlin Wall has completed 25 fractions to her left breast.  She denies pain and fatigue. The skin on her left breast is red.  She is using radiaplex gel.  BP 130/70 mmHg  Pulse 73  Temp(Src) 98.5 F (36.9 C) (Oral)  Ht 5' 5.5" (1.664 m)  Wt 196 lb (88.905 kg)  BMI 32.11 kg/m2

## 2015-03-10 NOTE — Progress Notes (Signed)
   Department of Radiation Oncology  Phone:  [phone] Fax:        [phone]  Weekly Treatment Note    Name: Caitlin Wall Date: 03/10/2015 MRN: [id number] DOB: 23-May-1955   Diagnosis:     ICD-9-CM ICD-10-CM   1. Breast cancer of lower-outer quadrant of left female breast (HCC) 174.5 C50.512      Current dose: 45 Gy  Current fraction: 25   MEDICATIONS: Current Outpatient Prescriptions  Medication Sig Dispense Refill  . aspirin 81 MG tablet Take 81 mg by mouth daily.      . hyaluronate sodium (RADIAPLEXRX) GEL Apply 1 application topically 2 (two) times daily.    Marland Kitchen ibuprofen (ADVIL,MOTRIN) 200 MG tablet Take 200 mg by mouth as needed.    Marland Kitchen losartan (COZAAR) 50 MG tablet Take 1 tablet (50 mg total) by mouth daily. 90 tablet 3  . non-metallic deodorant (ALRA) MISC Apply 1 application topically daily as needed.    . Omega-3 Fatty Acids (FISH OIL) 1000 MG CAPS Take 1,000 mg by mouth daily.     No current facility-administered medications for this encounter.     ALLERGIES: Codeine   LABORATORY DATA:  Lab Results  Component Value Date   WBC 8.7 01/16/2015   HGB 14.6 01/16/2015   HCT 44.4 01/16/2015   MCV 90.2 01/16/2015   PLT 215 01/16/2015   Lab Results  Component Value Date   NA 143 01/16/2015   K 3.9 01/16/2015   CL 106 12/23/2014   CO2 26 01/16/2015   Lab Results  Component Value Date   ALT 45 01/16/2015   AST 47* 01/16/2015   ALKPHOS 159* 01/16/2015   BILITOT 1.22* 01/16/2015     NARRATIVE: Caitlin Wall was seen today for weekly treatment management. The chart was checked and the patient's films were reviewed.  Caitlin Wall has completed 25 fractions to her left breast.  She denies pain and fatigue. The skin on her left breast is red.  She is using radiaplex gel.  BP 130/70 mmHg  Pulse 73  Temp(Src) 98.5 F (36.9 C) (Oral)  Ht 5' 5.5" (1.664 m)  Wt 196 lb (88.905 kg)  BMI 32.11 kg/m2  PHYSICAL EXAMINATION: height is 5' 5.5"  (1.664 m) and weight is 196 lb (88.905 kg). Her oral temperature is 98.5 F (36.9 C). Her blood pressure is 130/70 and her pulse is 73.      the patient's skin looks quite good at this point, no significant desquamation.  ASSESSMENT: The patient is doing satisfactorily with treatment.  PLAN: We will continue with the patient's radiation treatment as planned.

## 2015-03-13 ENCOUNTER — Ambulatory Visit
Admission: RE | Admit: 2015-03-13 | Discharge: 2015-03-13 | Disposition: A | Discharge status: Home / Self Care | Payer: BLUE CROSS/BLUE SHIELD | Source: Ambulatory Visit | Attending: Radiation Oncology | Admitting: Radiation Oncology

## 2015-03-13 DIAGNOSIS — Z510 Encounter for antineoplastic radiation therapy: Secondary | ICD-10-CM | POA: Diagnosis not present

## 2015-03-14 ENCOUNTER — Ambulatory Visit
Admission: RE | Admit: 2015-03-14 | Discharge: 2015-03-14 | Disposition: A | Discharge status: Home / Self Care | Payer: BLUE CROSS/BLUE SHIELD | Source: Ambulatory Visit | Attending: Radiation Oncology | Admitting: Radiation Oncology

## 2015-03-14 DIAGNOSIS — Z510 Encounter for antineoplastic radiation therapy: Secondary | ICD-10-CM | POA: Diagnosis not present

## 2015-03-15 ENCOUNTER — Ambulatory Visit
Admission: RE | Admit: 2015-03-15 | Discharge: 2015-03-15 | Disposition: A | Discharge status: Home / Self Care | Payer: BLUE CROSS/BLUE SHIELD | Source: Ambulatory Visit | Attending: Radiation Oncology | Admitting: Radiation Oncology

## 2015-03-15 DIAGNOSIS — Z510 Encounter for antineoplastic radiation therapy: Secondary | ICD-10-CM | POA: Diagnosis not present

## 2015-03-15 DIAGNOSIS — C50512 Malignant neoplasm of lower-outer quadrant of left female breast: Secondary | ICD-10-CM

## 2015-03-15 MED ORDER — SONAFINE EX EMUL
1.0000 "application " | Freq: Once | CUTANEOUS | Status: AC
  Administered 2015-03-15: 1 via TOPICAL

## 2015-03-15 MED ORDER — RADIAPLEXRX EX GEL
Freq: Once | CUTANEOUS | Status: AC
  Administered 2015-03-15: 17:00:00 via TOPICAL

## 2015-03-16 ENCOUNTER — Ambulatory Visit
Admission: RE | Admit: 2015-03-16 | Discharge: 2015-03-16 | Disposition: A | Discharge status: Home / Self Care | Payer: BLUE CROSS/BLUE SHIELD | Source: Ambulatory Visit | Attending: Radiation Oncology | Admitting: Radiation Oncology

## 2015-03-16 DIAGNOSIS — Z510 Encounter for antineoplastic radiation therapy: Secondary | ICD-10-CM | POA: Diagnosis not present

## 2015-03-17 ENCOUNTER — Ambulatory Visit
Admission: RE | Admit: 2015-03-17 | Discharge: 2015-03-17 | Disposition: A | Discharge status: Home / Self Care | Payer: BLUE CROSS/BLUE SHIELD | Source: Ambulatory Visit | Attending: Radiation Oncology | Admitting: Radiation Oncology

## 2015-03-17 ENCOUNTER — Encounter: Payer: Self-pay | Admitting: Radiation Oncology

## 2015-03-17 VITALS — BP 145/61 | HR 88 | Resp 16 | Wt 195.4 lb

## 2015-03-17 DIAGNOSIS — Z510 Encounter for antineoplastic radiation therapy: Secondary | ICD-10-CM | POA: Diagnosis not present

## 2015-03-17 DIAGNOSIS — C50512 Malignant neoplasm of lower-outer quadrant of left female breast: Secondary | ICD-10-CM

## 2015-03-17 NOTE — Progress Notes (Signed)
Weight and vitals stable. Denies pain. Hyperpigmentation of left/treated breast without desquamation noted. Reports using radiaplex bid as directed. Denies nipple discharge. No lymphedema noted in left arm Denies fatigue.   BP 145/61 mmHg  Pulse 88  Resp 16  Wt 195 lb 6.4 oz (88.633 kg)  SpO2 100% Wt Readings from Last 3 Encounters:  03/17/15 195 lb 6.4 oz (88.633 kg)  03/10/15 196 lb (88.905 kg)  03/03/15 195 lb 11.2 oz (88.769 kg)

## 2015-03-17 NOTE — Progress Notes (Signed)
   Department of Radiation Oncology  Phone:  [phone] Fax:        [phone]  Weekly Treatment Note    Name: Caitlin Wall Date: 03/17/2015 MRN: [id number] DOB: [birth year]   Diagnosis:     ICD-9-CM ICD-10-CM   1. Breast cancer of lower-outer quadrant of left female breast (HCC) 174.5 C50.512      Current dose: 54.4 Gy  Current fraction: 30   MEDICATIONS: Current Outpatient Prescriptions  Medication Sig Dispense Refill  . aspirin 81 MG tablet Take 81 mg by mouth daily.      . hyaluronate sodium (RADIAPLEXRX) GEL Apply 1 application topically 2 (two) times daily.    Marland Kitchen ibuprofen (ADVIL,MOTRIN) 200 MG tablet Take 200 mg by mouth as needed.    Marland Kitchen losartan (COZAAR) 50 MG tablet Take 1 tablet (50 mg total) by mouth daily. 90 tablet 3  . non-metallic deodorant (ALRA) MISC Apply 1 application topically daily as needed.    . Omega-3 Fatty Acids (FISH OIL) 1000 MG CAPS Take 1,000 mg by mouth daily.     No current facility-administered medications for this encounter.     ALLERGIES: Codeine   LABORATORY DATA:  Lab Results  Component Value Date   WBC 8.7 01/16/2015   HGB 14.6 01/16/2015   HCT 44.4 01/16/2015   MCV 90.2 01/16/2015   PLT 215 01/16/2015   Lab Results  Component Value Date   NA 143 01/16/2015   K 3.9 01/16/2015   CL 106 12/23/2014   CO2 26 01/16/2015   Lab Results  Component Value Date   ALT 45 01/16/2015   AST 47* 01/16/2015   ALKPHOS 159* 01/16/2015   BILITOT 1.22* 01/16/2015     NARRATIVE: Caitlin Wall was seen today for weekly treatment management. The chart was checked and the patient's films were reviewed.  Denies pain, nipple discharge/bleeding, or fatigue. Hyperpigmentation of left/treated breast without desquamation and no lymphedema in the left arm noted by the nurse. Reports using radiaplex bid as directed.  BP 145/61 mmHg  Pulse 88  Resp 16  Wt 195 lb 6.4 oz (88.633 kg)  SpO2 100%  PHYSICAL EXAMINATION: weight  is 195 lb 6.4 oz (88.633 kg). Her blood pressure is 145/61 and her pulse is 88. Her respiration is 16 and oxygen saturation is 100%.  Diffuse erythema in the treatment area without desquamation.  ASSESSMENT: The patient is doing satisfactorily with treatment.  PLAN: We will continue with the patient's radiation treatment as planned.   This document serves as a record of services personally performed by Kyung Rudd, MD. It was created on his behalf by Darcus Austin, a trained medical scribe. The creation of this record is based on the scribe's personal observations and the provider's statements to them. This document has been checked and approved by the attending provider.

## 2015-03-17 NOTE — Progress Notes (Signed)
  Radiation Oncology         [phone]) [phone] ________________________________  Name: Caitlin Wall MRN: [id number]  Date: 03/09/2015  DOB: April 08, 1955  Complex simulation note  The patient has undergone complex simulation for her upcoming boost treatment for her diagnosis of breast cancer. The patient has initially been planned to receive 50.4 Gy. The patient will now receive a 14 Gy boost to the seroma cavity which has been contoured. This will be accomplished using an en face electron field. Based on the depth of the target area, 15 MeV electrons will be used. The patient's final total dose therefore will be 64.4 Gy. A complex isodose plan from the electron [facility] Carlo calculation is requested for the boost treatment.   _______________________________  Jodelle Gross, MD, PhD

## 2015-03-20 ENCOUNTER — Ambulatory Visit
Admission: RE | Admit: 2015-03-20 | Discharge: 2015-03-20 | Disposition: A | Discharge status: Home / Self Care | Payer: BLUE CROSS/BLUE SHIELD | Source: Ambulatory Visit | Attending: Radiation Oncology | Admitting: Radiation Oncology

## 2015-03-20 DIAGNOSIS — Z510 Encounter for antineoplastic radiation therapy: Secondary | ICD-10-CM | POA: Diagnosis not present

## 2015-03-21 ENCOUNTER — Ambulatory Visit
Admission: RE | Admit: 2015-03-21 | Discharge: 2015-03-21 | Disposition: A | Discharge status: Home / Self Care | Payer: BLUE CROSS/BLUE SHIELD | Source: Ambulatory Visit | Attending: Radiation Oncology | Admitting: Radiation Oncology

## 2015-03-21 DIAGNOSIS — Z510 Encounter for antineoplastic radiation therapy: Secondary | ICD-10-CM | POA: Diagnosis not present

## 2015-03-22 ENCOUNTER — Ambulatory Visit
Admission: RE | Admit: 2015-03-22 | Discharge: 2015-03-22 | Disposition: A | Discharge status: Home / Self Care | Payer: BLUE CROSS/BLUE SHIELD | Source: Ambulatory Visit | Attending: Radiation Oncology | Admitting: Radiation Oncology

## 2015-03-22 DIAGNOSIS — Z510 Encounter for antineoplastic radiation therapy: Secondary | ICD-10-CM | POA: Diagnosis not present

## 2015-03-23 ENCOUNTER — Ambulatory Visit
Admission: RE | Admit: 2015-03-23 | Discharge: 2015-03-23 | Disposition: A | Discharge status: Home / Self Care | Payer: BLUE CROSS/BLUE SHIELD | Source: Ambulatory Visit | Attending: Radiation Oncology | Admitting: Radiation Oncology

## 2015-03-23 DIAGNOSIS — Z510 Encounter for antineoplastic radiation therapy: Secondary | ICD-10-CM | POA: Diagnosis not present

## 2015-03-24 ENCOUNTER — Ambulatory Visit
Admission: RE | Admit: 2015-03-24 | Discharge: 2015-03-24 | Disposition: A | Discharge status: Home / Self Care | Payer: BLUE CROSS/BLUE SHIELD | Source: Ambulatory Visit | Attending: Radiation Oncology | Admitting: Radiation Oncology

## 2015-03-24 ENCOUNTER — Encounter: Payer: Self-pay | Admitting: Radiation Oncology

## 2015-03-24 VITALS — BP 134/84 | HR 87 | Resp 16 | Wt 195.8 lb

## 2015-03-24 DIAGNOSIS — Z510 Encounter for antineoplastic radiation therapy: Secondary | ICD-10-CM | POA: Diagnosis not present

## 2015-03-24 DIAGNOSIS — C50512 Malignant neoplasm of lower-outer quadrant of left female breast: Secondary | ICD-10-CM

## 2015-03-24 NOTE — Progress Notes (Signed)
Weight and vitals stable. Denies pain. Hyperpigmentation of left/treated breast noted. Mild dry desquamation noted left mammary fold. Reports using radiaplex, alra, and neosporin to manage skin changes. Reports mild fatigue. Provided patient with one month follow up appointment. Provided patient with additional radiaplex and alra of which she understands to continue using for the next two weeks or until her skin heals. Provided patient with [facility], [employer] and Surviv[facility] flyers.   BP 134/84 mmHg  Pulse 87  Resp 16  Wt 195 lb 12.8 oz (88.814 kg)  SpO2 100% Wt Readings from Last 3 Encounters:  03/24/15 195 lb 12.8 oz (88.814 kg)  03/17/15 195 lb 6.4 oz (88.633 kg)  03/10/15 196 lb (88.905 kg)

## 2015-03-26 NOTE — Progress Notes (Addendum)
  Department of Radiation Oncology  Phone:  [phone] Fax:        [phone]  Weekly Treatment Note    Name: Caitlin Wall [facility] Date: 03/26/2015 MRN: [id number] DOB: 01-02-1956   Current dose: 64.4 Gy  Current fraction: 35   MEDICATIONS: Current Outpatient Prescriptions  Medication Sig Dispense Refill  . aspirin 81 MG tablet Take 81 mg by mouth daily.      . hyaluronate sodium (RADIAPLEXRX) GEL Apply 1 application topically 2 (two) times daily.    Marland Kitchen ibuprofen (ADVIL,MOTRIN) 200 MG tablet Take 200 mg by mouth as needed.    Marland Kitchen losartan (COZAAR) 50 MG tablet Take 1 tablet (50 mg total) by mouth daily. 90 tablet 3  . non-metallic deodorant (ALRA) MISC Apply 1 application topically daily as needed.    . Omega-3 Fatty Acids (FISH OIL) 1000 MG CAPS Take 1,000 mg by mouth daily.     No current facility-administered medications for this encounter.     ALLERGIES: Codeine   LABORATORY DATA:  Lab Results  Component Value Date   WBC 8.7 01/16/2015   HGB 14.6 01/16/2015   HCT 44.4 01/16/2015   MCV 90.2 01/16/2015   PLT 215 01/16/2015   Lab Results  Component Value Date   NA 143 01/16/2015   K 3.9 01/16/2015   CL 106 12/23/2014   CO2 26 01/16/2015   Lab Results  Component Value Date   ALT 45 01/16/2015   AST 47* 01/16/2015   ALKPHOS 159* 01/16/2015   BILITOT 1.22* 01/16/2015     NARRATIVE: Matilde Bash Ruddick was seen today for weekly treatment management. The chart was checked and the patient's films were reviewed.   Weight and vitals stable. Denies pain. Hyperpigmentation of left/treated breast noted. Mild dry desquamation noted left mammary fold. Reports using radiaplex, alra, and neosporin to manage skin changes. Reports mild fatigue. Provided patient with one month follow up appointment. Provided patient with additional radiaplex and alra of which she understands to continue using for the next two weeks or until her skin heals. Provided patient with [facility],  [employer] and Survivorship flyers.   BP 134/84 mmHg  Pulse 87  Resp 16  Wt 195 lb 12.8 oz (88.814 kg)  SpO2 100% Wt Readings from Last 3 Encounters:  03/24/15 195 lb 12.8 oz (88.814 kg)  03/17/15 195 lb 6.4 oz (88.633 kg)  03/10/15 196 lb (88.905 kg)     PHYSICAL EXAMINATION: weight is 195 lb 12.8 oz (88.814 kg). Her blood pressure is 134/84 and her pulse is 87. Her respiration is 16 and oxygen saturation is 100%.      the patient's skin shows radiation effect without any moist desquamation. Overall looks quite good for finishing treatment.  ASSESSMENT: The patient did satisfactorily with treatment.  PLAN: The patient will follow-up in our clinic in 1 month.

## 2015-03-26 NOTE — Addendum Note (Signed)
Encounter addended by: Kyung Rudd, MD on: 03/26/2015 11:23 AM<BR>     Documentation filed: Notes Section

## 2015-03-27 ENCOUNTER — Telehealth: Payer: Self-pay | Admitting: *Deleted

## 2015-03-27 NOTE — Telephone Encounter (Signed)
Spoke with patient to follow up after completion of XRT.  She states she is doing well.  No questions or concerns at this time.. Encouraged her to call with any needs or concerns.

## 2015-03-28 DIAGNOSIS — Z510 Encounter for antineoplastic radiation therapy: Secondary | ICD-10-CM | POA: Diagnosis not present

## 2015-03-28 MED ORDER — RADIAPLEXRX EX GEL
Freq: Once | CUTANEOUS | Status: AC
  Administered 2015-03-28: 11:00:00 via TOPICAL

## 2015-03-28 MED ORDER — BIAFINE EX EMUL
Freq: Every day | CUTANEOUS | Status: DC
  Administered 2015-03-28: 11:00:00 via TOPICAL

## 2015-03-28 NOTE — Addendum Note (Signed)
Encounter addended by: Heywood Footman, RN on: 03/28/2015 10:48 AM<BR>     Documentation filed: [employer], Inpatient MAR, Orders

## 2015-03-29 ENCOUNTER — Other Ambulatory Visit: Payer: Self-pay | Admitting: Adult Health

## 2015-03-29 DIAGNOSIS — C50512 Malignant neoplasm of lower-outer quadrant of left female breast: Secondary | ICD-10-CM

## 2015-03-31 ENCOUNTER — Telehealth: Payer: Self-pay | Admitting: Oncology

## 2015-03-31 NOTE — Telephone Encounter (Signed)
Left message for patient for survivorship appointment March 30 @ 230 pm.

## 2015-04-11 ENCOUNTER — Telehealth: Payer: Self-pay | Admitting: Oncology

## 2015-04-11 NOTE — Telephone Encounter (Signed)
Patient called and r/s lab from 2/16 to 2/15 - patient has new date/time.

## 2015-04-12 ENCOUNTER — Other Ambulatory Visit (HOSPITAL_BASED_OUTPATIENT_CLINIC_OR_DEPARTMENT_OTHER): Payer: BLUE CROSS/BLUE SHIELD

## 2015-04-12 DIAGNOSIS — C50512 Malignant neoplasm of lower-outer quadrant of left female breast: Secondary | ICD-10-CM | POA: Diagnosis not present

## 2015-04-13 ENCOUNTER — Other Ambulatory Visit: Payer: BLUE CROSS/BLUE SHIELD

## 2015-04-13 LAB — HEPATIC FUNCTION PANEL
ALBUMIN: 4.2 g/dL (ref 3.5–5.5)
ALK PHOS: 139 IU/L — AB (ref 39–117)
ALT: 24 IU/L (ref 0–32)
AST (SGOT): 26 IU/L (ref 0–40)
BILIRUBIN, DIRECT: 0.18 mg/dL (ref 0.00–0.40)
Bilirubin Total: 0.8 mg/dL (ref 0.0–1.2)
TOTAL PROTEIN: 6.6 g/dL (ref 6.0–8.5)

## 2015-04-13 LAB — HEPATITIS B SURFACE ANTIGEN: HBsAg Screen: NEGATIVE

## 2015-04-13 LAB — HEPATITIS B CORE ANTIBODY, IGM: Hep B Core Ab, IgM: NEGATIVE

## 2015-04-13 LAB — HEPATITIS C ANTIBODY

## 2015-04-19 ENCOUNTER — Ambulatory Visit (HOSPITAL_BASED_OUTPATIENT_CLINIC_OR_DEPARTMENT_OTHER): Payer: BLUE CROSS/BLUE SHIELD | Admitting: Oncology

## 2015-04-19 ENCOUNTER — Telehealth: Payer: Self-pay | Admitting: Oncology

## 2015-04-19 VITALS — BP 141/70 | HR 85 | Temp 97.8°F | Resp 18 | Ht 65.5 in | Wt 195.1 lb

## 2015-04-19 DIAGNOSIS — D0512 Intraductal carcinoma in situ of left breast: Secondary | ICD-10-CM | POA: Diagnosis not present

## 2015-04-19 DIAGNOSIS — C50512 Malignant neoplasm of lower-outer quadrant of left female breast: Secondary | ICD-10-CM

## 2015-04-19 MED ORDER — ANASTROZOLE 1 MG PO TABS: 1.0000 mg | ORAL_TABLET | Freq: Every day | ORAL | Status: DC

## 2015-04-19 NOTE — Progress Notes (Signed)
  Radiation Oncology         [phone]) [phone] ________________________________  Name: Caitlin Wall MRN: [id number]  Date: 03/24/2015  DOB: [birth year]  End of Treatment Note  Diagnosis:   Left-sided breast cancer     Indication for treatment:  Curative       Radiation treatment dates:   02/02/2015 through 03/24/2015  Site/dose:   The patient initially received a dose of 50.4 Gy in 28 fractions to the breast using whole-breast tangent fields. This was delivered using a 3-D conformal technique. The patient then received a boost to the seroma. This delivered an additional 10 Gy in 5 fractions using a en face electron field. The total dose was 60.4 Gy.  Narrative: The patient tolerated radiation treatment relatively well.   The patient had some expected skin irritation as she progressed during treatment. Moist desquamation was not present at the end of treatment.  Plan: The patient has completed radiation treatment. The patient will return to radiation oncology clinic for routine followup in one month. I advised the patient to call or return sooner if they have any questions or concerns related to their recovery or treatment. ________________________________  Jodelle Gross, M.D., Ph.D.

## 2015-04-19 NOTE — Telephone Encounter (Signed)
appt made and avs printed °

## 2015-04-19 NOTE — Progress Notes (Signed)
[facility]  [phone] [phone]     ID: Caitlin Wall DOB: 27-Jan-1956  MR#: [id number]  [id number]  Patient Care Team: Biagio Borg, MD as PCP - General Chauncey Cruel, MD as Consulting Physician (Oncology) PCP: [name], MD SU: Star Age MD OTHER MD: Keturah Barre MD, Kyung Rudd MD  CHIEF COMPLAINT: Ductal carcinoma in situ  CURRENT TREATMENT:    BREAST CANCER HISTORY:  From the original intake note:   Caitlin Wall had bilateral [facility]. She has subpectoral saline implants in place. There was a possible asymmetry in the left breast and she was recalled for left diagnostic mammography with tomosynthesis and left breast ultrasonography 11/03/2014. The breast density was category C. In the left breast lower outer quadrant there was an irregular mass which was not palpable and which by ultrasonography measured 0.6 cm. The left axilla was sonographically benign. Biopsy of this mass 11/09/2014 showed ([id number]) fibroadipose adipose tissue. This was felt to be discordant.  Accordingly the patient was referred to surgery and she underwent left breast radioactive seed localized lumpectomy 12/28/2014. The pathology from that procedure (([id number]) showed ductal carcinoma in situ, high-grade, measuring 0.2 cm. This was less than 0.1 cm from the posterior margin. The cells was estrogen receptor positive at 95%, with strong staining intensity, progesterone receptor positive at 30% with moderate staining intensity.  Her subsequent history is as detailed below.   INTERVAL HISTORY: Caitlin Wall returns today for follow-up of her estrogen receptor positive noninvasive breast cancer. Since her last visit here she completed her radiation treatments. He generally did well with those. She had some disclamation, but that has resolved. She was never particularly fatigued. She is now ready to start  anti-estrogens.  REVIEW OF SYSTEMS: She had a strange episode for her heart seemed to be really loud against her chest wall, but not irregularly. She says her chest wall still hurts from this. She was sitting while this was going on. This has not recurred. Aside from that problem a detailed review of systems today was noncontributory  PAST MEDICAL HISTORY: Past Medical History  Diagnosis Date  . Hx of colonic polyps 1995    adenomatous  . GERD (gastroesophageal reflux disease)   . Hyperlipidemia   . Hypertension   . COLONIC POLYPS, HX OF 11/04/2007  . GERD 11/04/2007  . HYPERLIPIDEMIA 11/04/2007  . HYPERTENSION 06/07/2009  . HAIR LOSS 11/04/2007  . LBBB (left bundle branch block) 07/26/2011  . Asthma, persistent not controlled 07/27/2012  . Breast cancer ([place]) 12/28/14    Left BresastDCIS    PAST SURGICAL HISTORY: Past Surgical History  Procedure Laterality Date  . Bilat ear surgury/hearing loss    . Abdominal hysterectomy    . Anterior fusion cervical spine    . Breast lumpectomy with radioactive seed localization Left 12/28/2014    Procedure: BREAST LUMPECTOMY WITH RADIOACTIVE SEED LOCALIZATION;  Surgeon: Autumn Messing III, MD;  Location: [facility];  Service: General;  Laterality: Left;    FAMILY HISTORY Family History  Problem Relation Age of Onset  . Lung cancer Father   . Breast cancer Maternal Grandmother   . Breast cancer Other   . Coronary artery disease Neg Hx   . Colon cancer Neg Hx   . Pancreatic cancer Neg Hx   . Rectal cancer Neg Hx   . Stomach cancer Neg Hx   . Cancer Paternal Grandmother  breast   the patient's father died from lung cancer in the setting of tobacco abuse at age 87. [name] died from "old age" at age 78. The patient had one brother, 3 sisters. The brother had" I cancer" requiring enucleation--the patient does not know whether this was a primary ocular melanoma. [name] was diagnosed with breast  cancer but the patient does not know at what age. There is no other history of breast or ovarian cancer in the family  GYNECOLOGIC HISTORY:  No LMP recorded. Patient has had a hysterectomy. Menarche age 77, first live birth age 58. The patient is GX P3. She is status post hysterectomy without salpingo-oophorectomy. She did not take hormone replacement. She did use oral contraceptives remotely without any complications.  SOCIAL HISTORY:  Caitlin Wall works in accounts payable. Her husband Caitlin Wall is disabled because of back problems. Daughter Caitlin Wall is a Marine scientist working in rehabilitation in [place]. Son Caitlin Wall lives in [place] where he works as a Games developer. Son Caitlin Wall lives in [place] where he works as a Emergency planning/management officer. The patient has 4 grandchildren. She is not a Ambulance person.    ADVANCED DIRECTIVES: Not in place   HEALTH MAINTENANCE: Social History  Substance Use Topics  . Smoking status: Former Smoker -- 1.00 packs/day for 20 years    Types: Cigarettes    Quit date: 08/19/1989  . Smokeless tobacco: Never Used  . Alcohol Use: No     Colonoscopy:  PAP:  Bone density:  Lipid panel:  Allergies  Allergen Reactions  . Codeine     REACTION: nausea,    Current Outpatient Prescriptions  Medication Sig Dispense Refill  . aspirin 81 MG tablet Take 81 mg by mouth daily.      Marland Kitchen ibuprofen (ADVIL,MOTRIN) 200 MG tablet Take 200 mg by mouth as needed.    Marland Kitchen losartan (COZAAR) 50 MG tablet Take 1 tablet (50 mg total) by mouth daily. 90 tablet 3  . Omega-3 Fatty Acids (FISH OIL) 1000 MG CAPS Take 1,000 mg by mouth daily.     No current facility-administered medications for this visit.    OBJECTIVE: Middle-aged White woman who appears stated age 60 Vitals:   04/19/15 1546  BP: 141/70  Pulse: 85  Temp: 97.8 F (36.6 C)  Resp: 18     Body mass index is 31.96 kg/(m^2).    ECOG FS:0 - Asymptomatic  Sclerae unicteric, pupils round and equal Oropharynx clear and moist--  no thrush or other lesions No cervical or supraclavicular adenopathy Lungs no rales or rhonchi Heart regular rate and rhythm, no murmur appreciated Abd soft, nontender, positive bowel sounds MSK no focal spinal tenderness, no upper extremity lymphedema Neuro: nonfocal, well oriented, appropriate affect Breasts: There are bilateral nipple stones in place. The right breast is otherwise unremarkable. The left breast is status post lumpectomy and radiation. The cosmetic result is excellent. There is minimal residual swelling. There is no evidence of disease recurrence. The left axilla is benign.   LAB RESULTS:  CMP     Component Value Date/Time   NA 143 01/16/2015 1546   NA 143 12/23/2014 1330   K 3.9 01/16/2015 1546   K 4.2 12/23/2014 1330   CL 106 12/23/2014 1330   CO2 26 01/16/2015 1546   CO2 29 12/23/2014 1330   GLUCOSE 112 01/16/2015 1546   GLUCOSE 76 12/23/2014 1330   BUN 11.0 01/16/2015 1546   BUN 11 12/23/2014 1330   CREATININE 0.9 01/16/2015 1546  CREATININE 0.83 12/23/2014 1330   CALCIUM 9.9 01/16/2015 1546   CALCIUM 9.9 12/23/2014 1330   PROT 6.6 04/12/2015 1531   PROT 6.9 01/16/2015 1546   PROT 6.9 10/07/2014 1013   ALBUMIN 4.2 04/12/2015 1531   ALBUMIN 3.7 01/16/2015 1546   ALBUMIN 3.9 10/07/2014 1013   AST 26 04/12/2015 1531   AST 47* 01/16/2015 1546   AST 23 10/07/2014 1013   ALT 24 04/12/2015 1531   ALT 45 01/16/2015 1546   ALT 20 10/07/2014 1013   ALKPHOS 139* 04/12/2015 1531   ALKPHOS 159* 01/16/2015 1546   ALKPHOS 122* 10/07/2014 1013   BILITOT 0.8 04/12/2015 1531   BILITOT 1.22* 01/16/2015 1546   BILITOT 1.0 10/07/2014 1013   GFRNONAA >60 12/23/2014 1330   GFRAA >60 12/23/2014 1330    INo results found for: SPEP, UPEP  Lab Results  Component Value Date   WBC 8.7 01/16/2015   NEUTROABS 5.4 01/16/2015   HGB 14.6 01/16/2015   HCT 44.4 01/16/2015   MCV 90.2 01/16/2015   PLT 215 01/16/2015      Chemistry      Component Value Date/Time    NA 143 01/16/2015 1546   NA 143 12/23/2014 1330   K 3.9 01/16/2015 1546   K 4.2 12/23/2014 1330   CL 106 12/23/2014 1330   CO2 26 01/16/2015 1546   CO2 29 12/23/2014 1330   BUN 11.0 01/16/2015 1546   BUN 11 12/23/2014 1330   CREATININE 0.9 01/16/2015 1546   CREATININE 0.83 12/23/2014 1330      Component Value Date/Time   CALCIUM 9.9 01/16/2015 1546   CALCIUM 9.9 12/23/2014 1330   ALKPHOS 139* 04/12/2015 1531   ALKPHOS 159* 01/16/2015 1546   ALKPHOS 122* 10/07/2014 1013   AST 26 04/12/2015 1531   AST 47* 01/16/2015 1546   AST 23 10/07/2014 1013   ALT 24 04/12/2015 1531   ALT 45 01/16/2015 1546   ALT 20 10/07/2014 1013   BILITOT 0.8 04/12/2015 1531   BILITOT 1.22* 01/16/2015 1546   BILITOT 1.0 10/07/2014 1013       No results found for: LABCA2  No components found for: LABCA125  No results for input(s): INR in the last 168 hours.  Urinalysis    Component Value Date/Time   COLORURINE YELLOW 10/07/2014 1013   APPEARANCEUR CLEAR 10/07/2014 1013   LABSPEC 1.015 10/07/2014 1013   PHURINE 7.5 10/07/2014 1013   GLUCOSEU NEGATIVE 10/07/2014 1013   HGBUR NEGATIVE 10/07/2014 1013   BILIRUBINUR NEGATIVE 10/07/2014 1013   KETONESUR NEGATIVE 10/07/2014 1013   UROBILINOGEN 1.0 10/07/2014 1013   NITRITE NEGATIVE 10/07/2014 1013   LEUKOCYTESUR NEGATIVE 10/07/2014 1013    STUDIES: No results found.  ASSESSMENT: 60 y.o. [place] woman status post left lumpectomy 12/28/2014 for ductal carcinoma in situ, high-grade measuring 0.2 cm, estrogen and progesterone receptor positive, with close but negative margins  (1) adjuvant radiation completed 03/24/2015  (2) anastrozole started 04/19/2015   PLAN Kashae has completed her local treatment for her noninvasive breast cancer. She understands she has a low risk of local recurrence and essentially no risk of systemic recurrence. This is of course very favorable.  The question is whether she may want to make a good situation  better. I would say her risk of local recurrence is in the 10% range. She took anti-estrogens for 5 years of would drop by 5%.  She is also at risk of developing a new breast cancer in either breast. That risk approximates  1% per year. If she took an antiestrogen for 5 years that risk also would be cut in half. That of course would be a more substantial number.  We then discussed the possible toxicities, side effects and complications of anastrozole  After much discussion she decided I think very reasonably that she would give anastrozole and try. If it significantly impacts her quality of life she will discontinue it. If she tolerates it well and can get it at a good price him a and should be the case, then she is willing to consider continuing it for a total of 5 years.  She is going to return to see me in May. If she is tolerating the anastrozole well we will obtain a bone density before the end of the year to deal with any concerns regarding osteopenia.  She knows to call for any problems that may develop before the next visit.    :Chauncey Cruel, MD   04/19/2015 4:05 PM Medical Oncology and Hematology [facility] [place], [place] [address] Tel. [phone]    Fax. [phone]

## 2015-04-23 ENCOUNTER — Other Ambulatory Visit: Payer: Self-pay | Admitting: Oncology

## 2015-04-23 DIAGNOSIS — Z86010 Personal history of colonic polyps: Secondary | ICD-10-CM

## 2015-04-23 DIAGNOSIS — C50512 Malignant neoplasm of lower-outer quadrant of left female breast: Secondary | ICD-10-CM

## 2015-04-25 ENCOUNTER — Telehealth: Payer: Self-pay | Admitting: Oncology

## 2015-04-25 NOTE — Telephone Encounter (Signed)
Spoke with patient and confirmed lab appt for 3/9 per 2/26 pof

## 2015-05-01 ENCOUNTER — Encounter: Payer: Self-pay | Admitting: Radiation Oncology

## 2015-05-04 ENCOUNTER — Ambulatory Visit
Admission: RE | Admit: 2015-05-04 | Discharge: 2015-05-04 | Disposition: A | Discharge status: Home / Self Care | Payer: BLUE CROSS/BLUE SHIELD | Source: Ambulatory Visit | Attending: Radiation Oncology | Admitting: Radiation Oncology

## 2015-05-04 ENCOUNTER — Other Ambulatory Visit (HOSPITAL_BASED_OUTPATIENT_CLINIC_OR_DEPARTMENT_OTHER): Payer: BLUE CROSS/BLUE SHIELD

## 2015-05-04 ENCOUNTER — Encounter: Payer: Self-pay | Admitting: Radiation Oncology

## 2015-05-04 VITALS — BP 154/68 | HR 82 | Temp 98.7°F | Resp 16 | Ht 65.5 in | Wt 196.4 lb

## 2015-05-04 DIAGNOSIS — C50512 Malignant neoplasm of lower-outer quadrant of left female breast: Secondary | ICD-10-CM | POA: Diagnosis not present

## 2015-05-04 DIAGNOSIS — Z86010 Personal history of colonic polyps: Secondary | ICD-10-CM

## 2015-05-04 HISTORY — DX: Personal history of irradiation: Z92.3

## 2015-05-04 NOTE — Progress Notes (Signed)
Follow up  Left sided breast  Radiation 02/02/15-03/24/15,   Breast well healed,   Survivorship appt 05/25/15 with Chestine Spore, NP,  Next appt with Dr. Jana Hakim 06/27/15, sill start anastrozole 1mg   Tablet this  Saturday 05/06/15 stated, energy good, occasional twing under left axilla 4:21 PM BP 154/68 mmHg  Pulse 82  Temp(Src) 98.7 F (37.1 C) (Oral)  Resp 16  Ht 5' 5.5" (1.664 m)  Wt 196 lb 6.4 oz (89.086 kg)  BMI 32.17 kg/m2  Wt Readings from Last 3 Encounters:  05/04/15 196 lb 6.4 oz (89.086 kg)  04/19/15 195 lb 1.6 oz (88.497 kg)  03/24/15 195 lb 12.8 oz (88.814 kg)

## 2015-05-04 NOTE — Progress Notes (Signed)
Radiation Oncology         [phone] ________________________________  Name: Caitlin Wall MRN: [id number]  Date: 05/04/2015  DOB: [birth year]  Follow-Up Visit Note  CC: Caitlin Cower, MD  Caitlin Kussmaul, MD  Diagnosis:   Left-sided breast cancer  Interval Since Last Radiation:  Approximately one month   Narrative:  The patient returns today for routine follow-up.  She has done well overall since she finished treatment. The patient's skin has healed significantly since she completed her course of radiation treatment. She has not begun anti-hormonal treatment.  She will begin this within the week.                            ALLERGIES:  is allergic to codeine.  Meds: Current Outpatient Prescriptions  Medication Sig Dispense Refill  . aspirin 81 MG tablet Take 81 mg by mouth daily.      Marland Kitchen ibuprofen (ADVIL,MOTRIN) 200 MG tablet Take 200 mg by mouth as needed.    Marland Kitchen losartan (COZAAR) 50 MG tablet Take 1 tablet (50 mg total) by mouth daily. 90 tablet 3  . anastrozole (ARIMIDEX) 1 MG tablet Take 1 tablet (1 mg total) by mouth daily. (Patient not taking: Reported on 05/04/2015) 90 tablet 4  . Omega-3 Fatty Acids (FISH OIL) 1000 MG CAPS Take 1,000 mg by mouth daily. Reported on 05/04/2015     No current facility-administered medications for this encounter.    Physical Findings: The patient is in no acute distress. Patient is alert and oriented.  height is 5' 5.5" (1.664 m) and weight is 196 lb 6.4 oz (89.086 kg). Her oral temperature is 98.7 F (37.1 C). Her blood pressure is 154/68 and her pulse is 82. Her respiration is 16. .   The skin in the treatment area has healed satisfactorily, no areas of concern/moist desquamation/poor healing. Skin looks excellent.   Lab Findings: Lab Results  Component Value Date   WBC 8.7 01/16/2015   HGB 14.6 01/16/2015   HCT 44.4 01/16/2015   MCV 90.2 01/16/2015   PLT 215 01/16/2015     Radiographic Findings: No results found.  Impression:     The patient has done satisfactorily since finishing treatment. She has not begun anti-hormonal treatment But will do so in several days.  Plan:  The patient will followup in our clinic on a when necessary basis.   Caitlin Wall, M.D., Ph.D.

## 2015-05-08 ENCOUNTER — Encounter: Payer: Self-pay | Admitting: Oncology

## 2015-05-08 LAB — ALKALINE PHOSPHATASE, ISOENZYMES
Alkaline Phosphatase, S: 149 IU/L — ABNORMAL HIGH (ref 39–117)
BONE FRACTION: 25 % (ref 14–68)
INTESTINAL FRAC.: 0 % (ref 0–18)
LIVER FRACTION: 75 % (ref 18–85)

## 2015-05-08 NOTE — Progress Notes (Signed)
Pt called needing assistance for a bill she received that was over $7,000.  After researching, that bill came from the hospital side so I referred her to hospital billing to inquire about assistance for that bill.

## 2015-05-15 ENCOUNTER — Telehealth: Payer: Self-pay | Admitting: Nurse Practitioner

## 2015-05-15 NOTE — Telephone Encounter (Signed)
Due to PAL moved 3/30 SCP visit to 4/4 @ 2 pm. spoke with patient she is aware.

## 2015-05-25 ENCOUNTER — Encounter: Payer: BLUE CROSS/BLUE SHIELD | Admitting: Nurse Practitioner

## 2015-05-30 ENCOUNTER — Encounter: Payer: BLUE CROSS/BLUE SHIELD | Admitting: Nurse Practitioner

## 2015-06-02 ENCOUNTER — Telehealth: Payer: Self-pay | Admitting: *Deleted

## 2015-06-02 NOTE — Telephone Encounter (Signed)
LM for pt to rtn call. Pt was scheduled for Survivorship appt 4/4. Would she like the care plan mailed to her or reschedule appt?

## 2015-06-09 ENCOUNTER — Encounter: Payer: Self-pay | Admitting: Nurse Practitioner

## 2015-06-09 DIAGNOSIS — C50512 Malignant neoplasm of lower-outer quadrant of left female breast: Secondary | ICD-10-CM

## 2015-06-09 NOTE — Progress Notes (Signed)
The Survivorship Care Plan was mailed to Caitlin Wall as she was unable to come in to the Survivorship Clinic for an in-person visit at this time. A letter was mailed to her outlining the purpose of the content of the care plan, as well as encouraging her to reach out to me with any questions or concerns.  My business card was included in the correspondence to the patient as well.  A copy of the care plan was also routed/faxed/mailed to Caitlin Cower, MD, the patient's PCP.  I will not be placing any follow-up appointments to the Survivorship Clinic for Caitlin Wall, but I am happy to see her at any time in the future for any survivorship concerns that may arise. Thank you for allowing me to participate in her care!  Kenn File, [place] [phone]

## 2015-06-27 ENCOUNTER — Telehealth: Payer: Self-pay | Admitting: Oncology

## 2015-06-27 ENCOUNTER — Ambulatory Visit (HOSPITAL_BASED_OUTPATIENT_CLINIC_OR_DEPARTMENT_OTHER): Payer: BLUE CROSS/BLUE SHIELD | Admitting: Oncology

## 2015-06-27 VITALS — BP 155/84 | HR 79 | Temp 97.7°F | Resp 18 | Ht 65.5 in | Wt 196.3 lb

## 2015-06-27 DIAGNOSIS — C50512 Malignant neoplasm of lower-outer quadrant of left female breast: Secondary | ICD-10-CM

## 2015-06-27 DIAGNOSIS — D0512 Intraductal carcinoma in situ of left breast: Secondary | ICD-10-CM

## 2015-06-27 MED ORDER — VITAMIN D 1000 UNITS PO TABS: 1000.0000 [IU] | ORAL_TABLET | Freq: Every day | ORAL | Status: DC

## 2015-06-27 NOTE — Telephone Encounter (Signed)
appt made and avs printed °

## 2015-06-27 NOTE — Progress Notes (Signed)
[place]  [phone] Fax:([phone]) [phone]     ID: Caitlin Wall DOB: 10/31/1955  MR#: [id number]  [id number]  Patient Care Team: Biagio Borg, MD as PCP - General Chauncey Cruel, MD as Consulting Physician (Oncology) Kyung Rudd, MD as Consulting Physician (Radiation Oncology) Autumn Messing III, MD as Consulting Physician (General Surgery) Sylvan Cheese, NP as Nurse Practitioner (Hematology and Oncology) PCP: [name], MD SU: Star Age MD OTHER MD: Keturah Barre MD, Kyung Rudd MD  CHIEF COMPLAINT: Ductal carcinoma in situ  CURRENT TREATMENT: Anastrozole   BREAST CANCER HISTORY:  From the original intake note:   Caitlin Wall [facility]. She has subpectoral saline implants in place. There was a possible asymmetry in the left breast and she was recalled for left diagnostic mammography with tomosynthesis and left breast ultrasonography 11/03/2014. The breast density was category C. In the left breast lower outer quadrant there was an irregular mass which was not palpable and which by ultrasonography measured 0.6 cm. The left axilla was sonographically benign. Biopsy of this mass 11/09/2014 showed ([id number]) fibroadipose adipose tissue. This was felt to be discordant.  Accordingly the patient was referred to surgery and she underwent left breast radioactive seed localized lumpectomy 12/28/2014. The pathology from that procedure (([id number]) showed ductal carcinoma in situ, high-grade, measuring 0.2 cm. This was less than 0.1 cm from the posterior margin. The cells was estrogen receptor positive at 95%, with strong staining intensity, progesterone receptor positive at 30% with moderate staining intensity.  Her subsequent history is as detailed below.   INTERVAL HISTORY: Caitlin Wall returns today for follow-up of her ductal carcinoma in situ breast cancer accompanied by her husband Caitlin Wall.  Caitlin Wall has been on anastrozole since February 2017. She is generally tolerating it quite well. She has occasional hot flashes and occasional night sweats but not anything she wanted take medication for vaginal dryness is not an issue. She does have some arthralgias but they are not different more intense or more persistent than before she started the medication. She obtains the anastrozole at $5 per month.  REVIEW OF SYSTEMS: She is having some hearing issues. She has a hearing aid in place. Sometimes when she brushes she gets a little bit short of breath but not otherwise. She still has some pain occasionally in the surgical breast. She also has back and joint pain which is again not more intense or persistent than before. A detailed review of systems was otherwise noncontributory  PAST MEDICAL HISTORY: Past Medical History  Diagnosis Date  . Hx of colonic polyps 1995    adenomatous  . GERD (gastroesophageal reflux disease)   . Hyperlipidemia   . Hypertension   . COLONIC POLYPS, HX OF 11/04/2007  . GERD 11/04/2007  . HYPERLIPIDEMIA 11/04/2007  . HYPERTENSION 06/07/2009  . HAIR LOSS 11/04/2007  . LBBB (left bundle branch block) 07/26/2011  . Asthma, persistent not controlled 07/27/2012  . Breast cancer ([place]) 12/28/14    Left BresastDCIS  . S/P radiation therapy 02/02/15-03/24/15    left breast 60.4GY    PAST SURGICAL HISTORY: Past Surgical History  Procedure Laterality Date  . Bilat ear surgury/hearing loss    . Abdominal hysterectomy    . Anterior fusion cervical spine    . Breast lumpectomy with radioactive seed localization Left 12/28/2014    Procedure: BREAST LUMPECTOMY WITH RADIOACTIVE SEED LOCALIZATION;  Surgeon: Autumn Messing III, MD;  Location: [facility];  Service: General;  Laterality: Left;    FAMILY HISTORY Family History  Problem Relation Age of Onset  . Lung cancer Father   . Breast cancer Maternal Grandmother   . Breast cancer Other   . Coronary artery disease Neg Hx    . Colon cancer Neg Hx   . Pancreatic cancer Neg Hx   . Rectal cancer Neg Hx   . Stomach cancer Neg Hx   . Cancer Paternal Grandmother     breast   the patient's father died from lung cancer in the setting of tobacco abuse at age 76. [name] died from "old age" at age 22. The patient had one brother, 3 sisters. The brother had" I cancer" requiring enucleation--the patient does not know whether this was a primary ocular melanoma. [name] was diagnosed with breast cancer but the patient does not know at what age. There is no other history of breast or ovarian cancer in the family  GYNECOLOGIC HISTORY:  No LMP recorded. Patient has had a hysterectomy. Menarche age 30, first live birth age 28. The patient is GX P3. She is status post hysterectomy without salpingo-oophorectomy. She did not take hormone replacement. She did use oral contraceptives remotely without any complications.  SOCIAL HISTORY:  Caitlin Wall works in accounts payable. Her husband Caitlin Wall is disabled because of back problems. Daughter Caitlin Wall is a Marine scientist working in rehabilitation in [place]. Son Caitlin Wall lives in [place] where he works as a Games developer. Son Caitlin Wall lives in [place] where he works as a Emergency planning/management officer. The patient has 4 grandchildren. She is not a Ambulance person.    ADVANCED DIRECTIVES: Not in place   HEALTH MAINTENANCE: Social History  Substance Use Topics  . Smoking status: Former Smoker -- 1.00 packs/day for 20 years    Types: Cigarettes    Quit date: 08/19/1989  . Smokeless tobacco: Never Used  . Alcohol Use: No     Colonoscopy:  PAP:  Bone density:  Lipid panel:  Allergies  Allergen Reactions  . Codeine     REACTION: nausea,    Current Outpatient Prescriptions  Medication Sig Dispense Refill  . anastrozole (ARIMIDEX) 1 MG tablet Take 1 tablet (1 mg total) by mouth daily. (Patient not taking: Reported on 05/04/2015) 90 tablet 4  . aspirin 81 MG  tablet Take 81 mg by mouth daily.      . cholecalciferol (VITAMIN D) 1000 units tablet Take 1 tablet (1,000 Units total) by mouth daily. 90 tablet 4  . ibuprofen (ADVIL,MOTRIN) 200 MG tablet Take 200 mg by mouth as needed.    Marland Kitchen losartan (COZAAR) 50 MG tablet Take 1 tablet (50 mg total) by mouth daily. 90 tablet 3  . Omega-3 Fatty Acids (FISH OIL) 1000 MG CAPS Take 1,000 mg by mouth daily. Reported on 05/04/2015     No current facility-administered medications for this visit.    OBJECTIVE: Middle-aged White woman In no acute distress. Filed Vitals:   06/27/15 1534  BP: 155/84  Pulse: 79  Temp: 97.7 F (36.5 C)  Resp: 18     Body mass index is 32.16 kg/(m^2).    ECOG FS:0 - Asymptomatic  Sclerae unicteric, EOMs intact Oropharynx clear, dentition in good repair No cervical or supraclavicular adenopathy Lungs no rales or rhonchi Heart regular rate and rhythm Abd soft, nontender, positive bowel sounds MSK no focal spinal tenderness, no upper extremity lymphedema Neuro: nonfocal, well oriented, appropriate affect Breasts: The right breast is unremarkable  except for the nipple study. The left breast is status post lumpectomy and radiation. There is also nipple study on the left and also a rose tattoo. There is no evidence of disease recurrence. The left axilla is benign.   LAB RESULTS:  CMP     Component Value Date/Time   NA 143 01/16/2015 1546   NA 143 12/23/2014 1330   K 3.9 01/16/2015 1546   K 4.2 12/23/2014 1330   CL 106 12/23/2014 1330   CO2 26 01/16/2015 1546   CO2 29 12/23/2014 1330   GLUCOSE 112 01/16/2015 1546   GLUCOSE 76 12/23/2014 1330   BUN 11.0 01/16/2015 1546   BUN 11 12/23/2014 1330   CREATININE 0.9 01/16/2015 1546   CREATININE 0.83 12/23/2014 1330   CALCIUM 9.9 01/16/2015 1546   CALCIUM 9.9 12/23/2014 1330   PROT 6.6 04/12/2015 1531   PROT 6.9 01/16/2015 1546   PROT 6.9 10/07/2014 1013   ALBUMIN 4.2 04/12/2015 1531   ALBUMIN 3.7 01/16/2015 1546   ALBUMIN  3.9 10/07/2014 1013   AST 26 04/12/2015 1531   AST 47* 01/16/2015 1546   AST 23 10/07/2014 1013   ALT 24 04/12/2015 1531   ALT 45 01/16/2015 1546   ALT 20 10/07/2014 1013   ALKPHOS 149* 05/04/2015 1533   ALKPHOS 159* 01/16/2015 1546   ALKPHOS 122* 10/07/2014 1013   BILITOT 0.8 04/12/2015 1531   BILITOT 1.22* 01/16/2015 1546   BILITOT 1.0 10/07/2014 1013   GFRNONAA >60 12/23/2014 1330   GFRAA >60 12/23/2014 1330    INo results found for: SPEP, UPEP  Lab Results  Component Value Date   WBC 8.7 01/16/2015   NEUTROABS 5.4 01/16/2015   HGB 14.6 01/16/2015   HCT 44.4 01/16/2015   MCV 90.2 01/16/2015   PLT 215 01/16/2015      Chemistry      Component Value Date/Time   NA 143 01/16/2015 1546   NA 143 12/23/2014 1330   K 3.9 01/16/2015 1546   K 4.2 12/23/2014 1330   CL 106 12/23/2014 1330   CO2 26 01/16/2015 1546   CO2 29 12/23/2014 1330   BUN 11.0 01/16/2015 1546   BUN 11 12/23/2014 1330   CREATININE 0.9 01/16/2015 1546   CREATININE 0.83 12/23/2014 1330      Component Value Date/Time   CALCIUM 9.9 01/16/2015 1546   CALCIUM 9.9 12/23/2014 1330   ALKPHOS 149* 05/04/2015 1533   ALKPHOS 159* 01/16/2015 1546   ALKPHOS 122* 10/07/2014 1013   AST 26 04/12/2015 1531   AST 47* 01/16/2015 1546   AST 23 10/07/2014 1013   ALT 24 04/12/2015 1531   ALT 45 01/16/2015 1546   ALT 20 10/07/2014 1013   BILITOT 0.8 04/12/2015 1531   BILITOT 1.22* 01/16/2015 1546   BILITOT 1.0 10/07/2014 1013       No results found for: LABCA2  No components found for: LABCA125  No results for input(s): INR in the last 168 hours.  Urinalysis    Component Value Date/Time   COLORURINE YELLOW 10/07/2014 1013   APPEARANCEUR CLEAR 10/07/2014 1013   LABSPEC 1.015 10/07/2014 1013   PHURINE 7.5 10/07/2014 1013   GLUCOSEU NEGATIVE 10/07/2014 1013   HGBUR NEGATIVE 10/07/2014 1013   BILIRUBINUR NEGATIVE 10/07/2014 1013   KETONESUR NEGATIVE 10/07/2014 1013   UROBILINOGEN 1.0 10/07/2014 1013    NITRITE NEGATIVE 10/07/2014 1013   LEUKOCYTESUR NEGATIVE 10/07/2014 1013    STUDIES: No results found.  ASSESSMENT: 60 y.o. [place] woman status post  left lumpectomy 12/28/2014 for ductal carcinoma in situ, high-grade measuring 0.2 cm, estrogen and progesterone receptor positive, with close but negative margins  (1) adjuvant radiation completed 03/24/2015  (2) anastrozole started 04/19/2015   (3) mildly abnormal hepatic function panel: negative workup for hepatitis B and C, normal alpha-1-anti-trypsin  PLAN Laurenda has recovered from her local treatment for ductal carcinoma in situ and is tolerating anastrozole well. The plan will be to continue that drug for a total of 5 years.  Today we discussed bone issues and she understands it would be helpful if she took vitamin D daily. I went ahead and placed the prescription in for her. Weightbearing exercise, mostly walking, is also helpful.  She has never had a bone density. I went ahead and wrote an order for that to be done at the [facility] where she has her next mammogram.  I wrote her a prescription for post surgical bras that she may be able to obtain at [facility] since she is having a little bit of discomfort with her current support.  Otherwise I will start seeing her on a once a year basis at this point. She knows to call for any problems that may develop before her next visit here.    :Chauncey Cruel, MD   06/27/2015 3:50 PM Medical Oncology and Hematology [facility] [place], [place] [address] Tel. [phone]    Fax. [phone]

## 2015-08-17 ENCOUNTER — Ambulatory Visit: Payer: BLUE CROSS/BLUE SHIELD | Admitting: Internal Medicine

## 2015-10-11 ENCOUNTER — Ambulatory Visit (INDEPENDENT_AMBULATORY_CARE_PROVIDER_SITE_OTHER): Payer: BLUE CROSS/BLUE SHIELD | Admitting: Internal Medicine

## 2015-10-11 ENCOUNTER — Other Ambulatory Visit (INDEPENDENT_AMBULATORY_CARE_PROVIDER_SITE_OTHER): Payer: BLUE CROSS/BLUE SHIELD

## 2015-10-11 ENCOUNTER — Encounter: Payer: Self-pay | Admitting: Internal Medicine

## 2015-10-11 VITALS — BP 140/80 | HR 88 | Temp 98.0°F | Resp 20 | Wt 198.0 lb

## 2015-10-11 DIAGNOSIS — I10 Essential (primary) hypertension: Secondary | ICD-10-CM | POA: Diagnosis not present

## 2015-10-11 DIAGNOSIS — E785 Hyperlipidemia, unspecified: Secondary | ICD-10-CM | POA: Diagnosis not present

## 2015-10-11 DIAGNOSIS — Z0000 Encounter for general adult medical examination without abnormal findings: Secondary | ICD-10-CM

## 2015-10-11 LAB — BASIC METABOLIC PANEL
BUN: 13 mg/dL (ref 6–23)
CHLORIDE: 106 meq/L (ref 96–112)
CO2: 32 mEq/L (ref 19–32)
CREATININE: 0.87 mg/dL (ref 0.40–1.20)
Calcium: 10.2 mg/dL (ref 8.4–10.5)
GFR: 70.93 mL/min (ref >60.00)
GLUCOSE: 107 mg/dL — AB (ref 70–99)
Potassium: 4.4 mEq/L (ref 3.5–5.1)
Sodium: 144 mEq/L (ref 135–145)

## 2015-10-11 LAB — LIPID PANEL
CHOL/HDL RATIO: 4
Cholesterol: 170 mg/dL (ref 0–200)
HDL: 42.3 mg/dL (ref >39.00)
LDL Cholesterol: 105 mg/dL — ABNORMAL HIGH (ref 0–99)
NonHDL: 127.34
TRIGLYCERIDES: 112 mg/dL (ref 0.0–149.0)
VLDL: 22.4 mg/dL (ref 0.0–40.0)

## 2015-10-11 LAB — URINALYSIS, ROUTINE W REFLEX MICROSCOPIC
BILIRUBIN URINE: NEGATIVE
Hgb urine dipstick: NEGATIVE
KETONES UR: NEGATIVE
Leukocytes, UA: NEGATIVE
NITRITE: NEGATIVE
Specific Gravity, Urine: 1.01 (ref 1.000–1.030)
Total Protein, Urine: NEGATIVE
URINE GLUCOSE: NEGATIVE
UROBILINOGEN UA: 1 (ref 0.0–1.0)
pH: 6.5 (ref 5.0–8.0)

## 2015-10-11 LAB — HEPATIC FUNCTION PANEL
ALBUMIN: 4.1 g/dL (ref 3.5–5.2)
ALT: 22 U/L (ref 0–35)
AST: 24 U/L (ref 0–37)
Alkaline Phosphatase: 142 U/L — ABNORMAL HIGH (ref 39–117)
BILIRUBIN TOTAL: 1.3 mg/dL — AB (ref 0.2–1.2)
Bilirubin, Direct: 0.2 mg/dL (ref 0.0–0.3)
TOTAL PROTEIN: 7.1 g/dL (ref 6.0–8.3)

## 2015-10-11 LAB — CBC WITH DIFFERENTIAL/PLATELET
BASOS ABS: 0 10*3/uL (ref 0.0–0.1)
Basophils Relative: 0.7 % (ref 0.0–3.0)
EOS ABS: 0.2 10*3/uL (ref 0.0–0.7)
Eosinophils Relative: 2.8 % (ref 0.0–5.0)
HEMATOCRIT: 43.3 % (ref 36.0–46.0)
HEMOGLOBIN: 14.8 g/dL (ref 12.0–15.0)
LYMPHS PCT: 25.9 % (ref 12.0–46.0)
Lymphs Abs: 1.8 10*3/uL (ref 0.7–4.0)
MCHC: 34.3 g/dL (ref 30.0–36.0)
MCV: 88.6 fl (ref 78.0–100.0)
Monocytes Absolute: 0.8 10*3/uL (ref 0.1–1.0)
Monocytes Relative: 12.1 % — ABNORMAL HIGH (ref 3.0–12.0)
Neutro Abs: 4 10*3/uL (ref 1.4–7.7)
Neutrophils Relative %: 58.5 % (ref 43.0–77.0)
Platelets: 218 10*3/uL (ref 150.0–400.0)
RBC: 4.89 Mil/uL (ref 3.87–5.11)
RDW: 12.7 % (ref 11.5–15.5)
WBC: 6.9 10*3/uL (ref 4.0–10.5)

## 2015-10-11 LAB — TSH: TSH: 1.27 u[IU]/mL (ref 0.35–4.50)

## 2015-10-11 MED ORDER — LOSARTAN POTASSIUM 50 MG PO TABS: 50.0000 mg | ORAL_TABLET | Freq: Every day | ORAL | 3 refills | Status: DC

## 2015-10-11 NOTE — Progress Notes (Signed)
Subjective:    Patient ID: Caitlin Wall, female    DOB: 1956-02-23, 60 y.o.   MRN: [id number]  HPI  Here for wellness and f/u;  Overall doing ok;  Pt denies Chest pain, worsening SOB, DOE, wheezing, orthopnea, PND, worsening LE edema, palpitations, dizziness or syncope.  Pt denies neurological change such as new headache, facial or extremity weakness.  Pt denies polydipsia, polyuria, or low sugar symptoms. Pt states overall good compliance with treatment and medications, good tolerability, and has been trying to follow appropriate diet.  Pt denies worsening depressive symptoms, suicidal ideation or panic. No fever, night sweats, wt loss, loss of appetite, or other constitutional symptoms.  Pt states good ability with ADL's, has low fall risk, home safety reviewed and adequate, no other significant changes in hearing or vision, and only occasionally active with exercise. Plan to do better,  BP at home has been < 140/90 per pt, and now out, needs refill BP med BP Readings from Last 3 Encounters:  10/11/15 140/80  06/27/15 (!) 155/84  05/04/15 (!) 154/68   Wt Readings from Last 3 Encounters:  10/11/15 198 lb (89.8 kg)  06/27/15 196 lb 4.8 oz (89 kg)  05/04/15 196 lb 6.4 oz (89.1 kg)   Past Medical History:  Diagnosis Date  . Asthma, persistent not controlled 07/27/2012  . Breast cancer ([place]) 12/28/14   Left BresastDCIS  . COLONIC POLYPS, HX OF 11/04/2007  . GERD 11/04/2007  . GERD (gastroesophageal reflux disease)   . HAIR LOSS 11/04/2007  . Hx of colonic polyps 1995   adenomatous  . Hyperlipidemia   . HYPERLIPIDEMIA 11/04/2007  . Hypertension   . HYPERTENSION 06/07/2009  . LBBB (left bundle branch block) 07/26/2011  . S/P radiation therapy 02/02/15-03/24/15   left breast 60.4GY   Past Surgical History:  Procedure Laterality Date  . ABDOMINAL HYSTERECTOMY    . ANTERIOR FUSION CERVICAL SPINE    . bilat ear surgury/hearing loss    . BREAST LUMPECTOMY WITH RADIOACTIVE SEED LOCALIZATION  Left 12/28/2014   Procedure: BREAST LUMPECTOMY WITH RADIOACTIVE SEED LOCALIZATION;  Surgeon: Autumn Messing III, MD;  Location: [facility];  Service: General;  Laterality: Left;    reports that she quit smoking about 26 years ago. Her smoking use included Cigarettes. She has a 20.00 pack-year smoking history. She has never used smokeless tobacco. She reports that she does not drink alcohol or use drugs. family history includes Breast cancer in her maternal grandmother and other; Cancer in her paternal grandmother; Lung cancer in her father. Allergies  Allergen Reactions  . Codeine     REACTION: nausea,   Current Outpatient Prescriptions on File Prior to Visit  Medication Sig Dispense Refill  . anastrozole (ARIMIDEX) 1 MG tablet Take 1 tablet (1 mg total) by mouth daily. 90 tablet 4  . aspirin 81 MG tablet Take 81 mg by mouth daily.      . cholecalciferol (VITAMIN D) 1000 units tablet Take 1 tablet (1,000 Units total) by mouth daily. 90 tablet 4  . ibuprofen (ADVIL,MOTRIN) 200 MG tablet Take 200 mg by mouth as needed.    . Omega-3 Fatty Acids (FISH OIL) 1000 MG CAPS Take 1,000 mg by mouth daily. Reported on 05/04/2015     No current facility-administered medications on file prior to visit.     Review of Systems Constitutional: Negative for increased diaphoresis, or other activity, appetite or siginficant weight change other than noted HENT: Negative for worsening hearing loss, ear pain,  facial swelling, mouth sores and neck stiffness.   Eyes: Negative for other worsening pain, redness or visual disturbance.  Respiratory: Negative for choking or stridor Cardiovascular: Negative for other chest pain and palpitations.  Gastrointestinal: Negative for worsening diarrhea, blood in stool, or abdominal distention Genitourinary: Negative for hematuria, flank pain or change in urine volume.  Musculoskeletal: Negative for myalgias or other joint complaints.  Skin: Negative for other color  change and wound or drainage.  Neurological: Negative for syncope and numbness. other than noted Hematological: Negative for adenopathy. or other swelling Psychiatric/Behavioral: Negative for hallucinations, SI, self-injury, decreased concentration or other worsening agitation.      Objective:   Physical Exam BP 140/80   Pulse 88   Temp 98 F (36.7 C) (Oral)   Resp 20   Wt 198 lb (89.8 kg)   SpO2 90%   BMI 32.45 kg/m  VS noted,  Constitutional: Pt is oriented to person, place, and time. Appears well-developed and well-nourished, in no significant distress Head: Normocephalic and atraumatic  Eyes: Conjunctivae and EOM are normal. Pupils are equal, round, and reactive to light Right Ear: External ear normal.  Left Ear: External ear normal Nose: Nose normal.  Mouth/Throat: Oropharynx is clear and moist  Neck: Normal range of motion. Neck supple. No JVD present. No tracheal deviation present or significant neck LA or mass Cardiovascular: Normal rate, regular rhythm, normal heart sounds and intact distal pulses.   Pulmonary/Chest: Effort normal and breath sounds without rales or wheezing  Abdominal: Soft. Bowel sounds are normal. NT. No HSM  Musculoskeletal: Normal range of motion. Exhibits no edema Lymphadenopathy: Has no cervical adenopathy.  Neurological: Pt is alert and oriented to person, place, and time. Pt has normal reflexes. No cranial nerve deficit. Motor grossly intact Skin: Skin is warm and dry. No rash noted or new ulcers Psychiatric:  Has normal mood and affect. Behavior is normal.     Assessment & Plan:

## 2015-10-11 NOTE — Assessment & Plan Note (Signed)
Mild worsening in recent yrs, for low chol diet, consider statin

## 2015-10-11 NOTE — Assessment & Plan Note (Signed)

## 2015-10-11 NOTE — Assessment & Plan Note (Signed)
stable overall by history and exam, recent data reviewed with pt, and pt to continue medical treatment as before,  to f/u any worsening symptoms or concerns  

## 2015-10-11 NOTE — Progress Notes (Signed)
Pre visit review using our clinic review tool, if applicable. No additional management support is needed unless otherwise documented below in the visit note. 

## 2015-10-11 NOTE — Patient Instructions (Signed)

## 2015-10-13 ENCOUNTER — Encounter: Payer: Self-pay | Admitting: Internal Medicine

## 2016-02-13 ENCOUNTER — Other Ambulatory Visit: Payer: Self-pay | Admitting: Oncology

## 2016-02-13 ENCOUNTER — Other Ambulatory Visit: Payer: Self-pay | Admitting: Internal Medicine

## 2016-02-13 DIAGNOSIS — C50512 Malignant neoplasm of lower-outer quadrant of left female breast: Secondary | ICD-10-CM

## 2016-02-23 ENCOUNTER — Other Ambulatory Visit: Payer: BLUE CROSS/BLUE SHIELD

## 2016-02-26 DIAGNOSIS — I82409 Acute embolism and thrombosis of unspecified deep veins of unspecified lower extremity: Secondary | ICD-10-CM

## 2016-02-26 HISTORY — DX: Acute embolism and thrombosis of unspecified deep veins of unspecified lower extremity: I82.409

## 2016-03-01 ENCOUNTER — Ambulatory Visit
Admission: RE | Admit: 2016-03-01 | Discharge: 2016-03-01 | Disposition: A | Discharge status: Home / Self Care | Payer: BLUE CROSS/BLUE SHIELD | Source: Ambulatory Visit | Attending: Oncology | Admitting: Oncology

## 2016-03-01 DIAGNOSIS — C50512 Malignant neoplasm of lower-outer quadrant of left female breast: Secondary | ICD-10-CM

## 2016-04-16 ENCOUNTER — Encounter: Payer: Self-pay | Admitting: Internal Medicine

## 2016-04-16 ENCOUNTER — Ambulatory Visit (HOSPITAL_COMMUNITY)
Admission: RE | Admit: 2016-04-16 | Discharge: 2016-04-16 | Disposition: A | Discharge status: Home / Self Care | Payer: BLUE CROSS/BLUE SHIELD | Source: Ambulatory Visit | Attending: Internal Medicine | Admitting: Internal Medicine

## 2016-04-16 ENCOUNTER — Ambulatory Visit
Admission: RE | Admit: 2016-04-16 | Discharge: 2016-04-16 | Disposition: A | Discharge status: Home / Self Care | Payer: BLUE CROSS/BLUE SHIELD | Source: Ambulatory Visit | Attending: Internal Medicine | Admitting: Internal Medicine

## 2016-04-16 ENCOUNTER — Ambulatory Visit (INDEPENDENT_AMBULATORY_CARE_PROVIDER_SITE_OTHER): Payer: BLUE CROSS/BLUE SHIELD | Admitting: Internal Medicine

## 2016-04-16 ENCOUNTER — Other Ambulatory Visit: Payer: BLUE CROSS/BLUE SHIELD

## 2016-04-16 ENCOUNTER — Ambulatory Visit (INDEPENDENT_AMBULATORY_CARE_PROVIDER_SITE_OTHER)
Admission: RE | Admit: 2016-04-16 | Discharge: 2016-04-16 | Disposition: A | Discharge status: Home / Self Care | Payer: BLUE CROSS/BLUE SHIELD | Source: Ambulatory Visit | Attending: Internal Medicine | Admitting: Internal Medicine

## 2016-04-16 ENCOUNTER — Other Ambulatory Visit: Payer: Self-pay | Admitting: Internal Medicine

## 2016-04-16 ENCOUNTER — Inpatient Hospital Stay: Admission: RE | Admit: 2016-04-16 | Payer: BLUE CROSS/BLUE SHIELD | Source: Ambulatory Visit

## 2016-04-16 VITALS — BP 126/92 | HR 78 | Temp 97.0°F | Ht 65.0 in | Wt 197.0 lb

## 2016-04-16 DIAGNOSIS — E785 Hyperlipidemia, unspecified: Secondary | ICD-10-CM

## 2016-04-16 DIAGNOSIS — M7989 Other specified soft tissue disorders: Secondary | ICD-10-CM

## 2016-04-16 DIAGNOSIS — R079 Chest pain, unspecified: Secondary | ICD-10-CM

## 2016-04-16 DIAGNOSIS — I10 Essential (primary) hypertension: Secondary | ICD-10-CM

## 2016-04-16 DIAGNOSIS — I824Z2 Acute embolism and thrombosis of unspecified deep veins of left distal lower extremity: Secondary | ICD-10-CM | POA: Diagnosis not present

## 2016-04-16 DIAGNOSIS — M25562 Pain in left knee: Secondary | ICD-10-CM

## 2016-04-16 DIAGNOSIS — R042 Hemoptysis: Secondary | ICD-10-CM | POA: Diagnosis not present

## 2016-04-16 DIAGNOSIS — I82402 Acute embolism and thrombosis of unspecified deep veins of left lower extremity: Secondary | ICD-10-CM | POA: Diagnosis not present

## 2016-04-16 MED ORDER — RIVAROXABAN 20 MG PO TABS: 20.0000 mg | ORAL_TABLET | Freq: Every day | ORAL | 4 refills | Status: DC

## 2016-04-16 MED ORDER — IOPAMIDOL (ISOVUE-370) INJECTION 76%: 75.0000 mL | Freq: Once | INTRAVENOUS | Status: DC | PRN

## 2016-04-16 NOTE — Assessment & Plan Note (Signed)
stable overall by history and exam, recent data reviewed with pt, and pt to continue medical treatment as before,  to f/u any worsening symptoms or concerns BP Readings from Last 3 Encounters:  04/16/16 (!) 126/92  10/11/15 140/80  06/27/15 (!) 155/84

## 2016-04-16 NOTE — Assessment & Plan Note (Signed)
Small volume, resolved, likely infectious related now improved, for cxr

## 2016-04-16 NOTE — Assessment & Plan Note (Signed)
stable overall by history and exam, recent data reviewed with pt, and pt to continue medical treatment as before,  to f/u any worsening symptoms or concerns Lab Results  Component Value Date   LDLCALC 105 (H) 10/11/2015

## 2016-04-16 NOTE — Assessment & Plan Note (Signed)
Suspect baker cyst related, but cant r/o DVT, for LLE venous doppler, declines pain med

## 2016-04-16 NOTE — Assessment & Plan Note (Signed)
I suspect bakers cyst possibly due to underlying DJD, will refer to Dr Smith/sports med

## 2016-04-16 NOTE — Progress Notes (Addendum)
Subjective:    Patient ID: Caitlin Wall, female    DOB: 16-Jul-1955, 61 y.o.   MRN: [id number]  HPI  Here to f/u; overall doing ok,  Pt denies increasing sob or doe, wheezing, orthopnea, PND, increased LE swelling, palpitations, dizziness or syncope.  Pt denies new neurological symptoms such as new headache, or facial or extremity weakness or numbness.  Pt denies polydipsia, polyuria, or low sugar episode.   Pt denies new neurological symptoms such as new headache, or facial or extremity weakness or numbness.   Pt states overall good compliance with meds, mostly trying to follow appropriate diet, with wt overall stable Wt Readings from Last 3 Encounters:  04/16/16 197 lb (89.4 kg)  10/11/15 198 lb (89.8 kg)  06/27/15 196 lb 4.8 oz (89 kg)  Also with 2 days  post left knee with pain and swelling, mild to mod, constant, worse to walk and pants touching it, better to sit or first waking up.  No fever, trauma, Does have hx gout in her teens per pt in right great toe joint. Here with acute onset mild to mod 1 wk ST, HA, general weakness and malaise, with prod cough clear sputum but with small BRB, as well as right lateral chest pain worse with inspiration but noe SOB  Now feels improved, feels she had bronchitis.  Does have nodules noted on recent cxr, followed by Dr Annamaria Boots. On Arimedex for breast ca, no known metastasis Past Medical History:  Diagnosis Date  . Asthma, persistent not controlled 07/27/2012  . Breast cancer ([place]) 12/28/14   Left BresastDCIS  . COLONIC POLYPS, HX OF 11/04/2007  . GERD 11/04/2007  . GERD (gastroesophageal reflux disease)   . HAIR LOSS 11/04/2007  . Hx of colonic polyps 1995   adenomatous  . Hyperlipidemia   . HYPERLIPIDEMIA 11/04/2007  . Hypertension   . HYPERTENSION 06/07/2009  . LBBB (left bundle branch block) 07/26/2011  . S/P radiation therapy 02/02/15-03/24/15   left breast 60.4GY   Past Surgical History:  Procedure Laterality Date  . ABDOMINAL HYSTERECTOMY    .  ANTERIOR FUSION CERVICAL SPINE    . bilat ear surgury/hearing loss    . BREAST LUMPECTOMY WITH RADIOACTIVE SEED LOCALIZATION Left 12/28/2014   Procedure: BREAST LUMPECTOMY WITH RADIOACTIVE SEED LOCALIZATION;  Surgeon: Autumn Messing III, MD;  Location: [place];  Service: General;  Laterality: Left;    reports that she quit smoking about 26 years ago. Her smoking use included Cigarettes. She has a 20.00 pack-year smoking history. She has never used smokeless tobacco. She reports that she does not drink alcohol or use drugs. family history includes Breast cancer in her maternal grandmother and other; Cancer in her paternal grandmother; Lung cancer in her father. Allergies  Allergen Reactions  . Codeine     REACTION: nausea,   Current Outpatient Prescriptions on File Prior to Visit  Medication Sig Dispense Refill  . anastrozole (ARIMIDEX) 1 MG tablet Take 1 tablet (1 mg total) by mouth daily. 90 tablet 4  . aspirin 81 MG tablet Take 81 mg by mouth daily.      . cholecalciferol (VITAMIN D) 1000 units tablet Take 1 tablet (1,000 Units total) by mouth daily. 90 tablet 4  . ibuprofen (ADVIL,MOTRIN) 200 MG tablet Take 200 mg by mouth as needed.    Marland Kitchen losartan (COZAAR) 50 MG tablet Take 1 tablet (50 mg total) by mouth daily. 90 tablet 3  . Omega-3 Fatty Acids (FISH OIL) 1000 MG  CAPS Take 1,000 mg by mouth daily. Reported on 05/04/2015     No current facility-administered medications on file prior to visit.    Review of Systems  Constitutional: Negative for unusual diaphoresis or night sweats HENT: Negative for ear swelling or discharge Eyes: Negative for worsening visual haziness  Respiratory: Negative for choking and stridor.   Gastrointestinal: Negative for distension or worsening eructation Genitourinary: Negative for retention or change in urine volume.  Musculoskeletal: Negative for other MSK pain or swelling Skin: Negative for color change and worsening wound Neurological:  Negative for tremors and numbness other than noted  Psychiatric/Behavioral: Negative for decreased concentration or agitation other than above   /    Objective:   Physical Exam BP (!) 126/92   Pulse 78   Temp 97 F (36.1 C)   Ht 5\' 5"  (1.651 m)   Wt 197 lb (89.4 kg)   SpO2 96%   BMI 32.78 kg/m  VS noted, not ill appearing Constitutional: Pt appears in no apparent distress HENT: Head: NCAT.  Right Ear: External ear normal.  Left Ear: External ear normal.  Eyes: . Pupils are equal, round, and reactive to light. Conjunctivae and EOM are normal Neck: Normal range of motion. Neck supple.  Cardiovascular: Normal rate and regular rhythm.   Pulmonary/Chest: Effort normal and breath sounds without rales or wheezing.  Neurological: Pt is alert. Not confused , motor grossly intact Left knee with post tender swelling area diffusely, no overlying skin change or ulceration, left dorsalis pedis 1+ Skin: Skin is warm. No rash, 1+ LLE edema Psychiatric: Pt behavior is normal. No agitation.  Addendum:  Pt + for LLE DVT by report     Assessment & Plan:

## 2016-04-16 NOTE — Assessment & Plan Note (Signed)
Suspect c/w msk after cough and recent bronchitis, but for cxr in the setting of recent cough and bronchitis

## 2016-04-16 NOTE — Patient Instructions (Addendum)
Please continue all other medications as before, and refills have been done if requested.  Please have the pharmacy call with any other refills you may need.  Please continue your efforts at being more active, low cholesterol diet, and weight control.  Please keep your appointments with your specialists as you may have planned  You will be contacted regarding the referral for: Left leg venous Doppler test (to see [facility] now)  You will be contacted regarding the referral for: Dr Tamala Julian (sports medicine)  Please go to the XRAY Department in the Basement (go straight as you get off the elevator) for the x-ray testing  You will be contacted by phone if any changes need to be made immediately.  Otherwise, you will receive a letter about your results with an explanation, but please check with MyChart first.  Please remember to sign up for MyChart if you have not done so, as this will be important to you in the future with finding out test results, communicating by private email, and scheduling acute appointments online when needed.  Please return in 6 months, or sooner if needed, with Lab testing done 3-5 days before   Addendum:  You will be contacted regarding the referral for: Ct chest (now)  Please go to the LAB in the Basement (turn left off the elevator) for the tests to be done today - the hypercoagulable panel  Please take all new medication as prescribed  - the xarelto starter pack, then the 20 mg per day after that; will review again next visit regarding need to stop

## 2016-04-16 NOTE — Progress Notes (Signed)
VASCULAR LAB PRELIMINARY  PRELIMINARY  PRELIMINARY  PRELIMINARY  Bilateral lower extremity venous duplex completed.    Preliminary report:  Bilateral exam was completed per protocol when a DVT is noted in the requested extremity. Left positive for an acute occlusive DVT noted in the gastrocnemius vein There is no evidence of a superficial thrombosis or Baker's cyst. Right:  No evidence of DVT, superficial thrombosis, or Baker's cyst.  Dala Breault, RVS 04/16/2016, 11:51 AM

## 2016-04-16 NOTE — Addendum Note (Signed)
Addended by: Biagio Borg on: 04/16/2016 12:32 PM   Modules accepted: Orders

## 2016-04-16 NOTE — Assessment & Plan Note (Signed)
Addendum:  Pt _+ for DVT, final report pending; will check hypercoag panel given + FH DVT, Chest CT pe protocol due to dvt and right chest pain, and start xarelto starter pack now (given in office)

## 2016-04-17 ENCOUNTER — Emergency Department (HOSPITAL_COMMUNITY)
Admission: EM | Admit: 2016-04-17 | Discharge: 2016-04-17 | Disposition: A | Discharge status: Home / Self Care | Payer: BLUE CROSS/BLUE SHIELD | Attending: Emergency Medicine | Admitting: Emergency Medicine

## 2016-04-17 ENCOUNTER — Other Ambulatory Visit: Payer: BLUE CROSS/BLUE SHIELD

## 2016-04-17 ENCOUNTER — Encounter (HOSPITAL_COMMUNITY): Payer: Self-pay | Admitting: Emergency Medicine

## 2016-04-17 DIAGNOSIS — Z87891 Personal history of nicotine dependence: Secondary | ICD-10-CM | POA: Diagnosis not present

## 2016-04-17 DIAGNOSIS — Z7901 Long term (current) use of anticoagulants: Secondary | ICD-10-CM | POA: Diagnosis not present

## 2016-04-17 DIAGNOSIS — Z7982 Long term (current) use of aspirin: Secondary | ICD-10-CM | POA: Insufficient documentation

## 2016-04-17 DIAGNOSIS — J45909 Unspecified asthma, uncomplicated: Secondary | ICD-10-CM | POA: Diagnosis not present

## 2016-04-17 DIAGNOSIS — I10 Essential (primary) hypertension: Secondary | ICD-10-CM | POA: Insufficient documentation

## 2016-04-17 DIAGNOSIS — L819 Disorder of pigmentation, unspecified: Secondary | ICD-10-CM | POA: Diagnosis present

## 2016-04-17 DIAGNOSIS — Z853 Personal history of malignant neoplasm of breast: Secondary | ICD-10-CM | POA: Insufficient documentation

## 2016-04-17 NOTE — Discharge Instructions (Signed)
Please read and follow all provided instructions.  Your diagnoses today include:  1. Discoloration of skin of finger     Tests performed today include: Vital signs. See below for your results today.   Medications prescribed:  Take as prescribed   Home care instructions:  Follow any educational materials contained in this packet.  Follow-up instructions: Please follow-up with your primary care provider for further evaluation of symptoms and treatment   Return instructions:  Please return to the Emergency Department if you do not get better, if you get worse, or new symptoms OR  - Fever (temperature greater than 101.75F)  - Bleeding that does not stop with holding pressure to the area    -Severe pain (please note that you may be more sore the day after your accident)  - Chest Pain  - Difficulty breathing  - Severe nausea or vomiting  - Inability to tolerate food and liquids  - Passing out  - Skin becoming red around your wounds  - Change in mental status (confusion or lethargy)  - New numbness or weakness    Please return if you have any other emergent concerns.  Additional Information:  Your vital signs today were: BP 138/58 (BP Location: Left Arm)    Pulse 86    Temp 98 F (36.7 C) (Oral)    Resp 25    Ht 5\' 5"  (1.651 m)    Wt 89.4 kg    SpO2 97%    BMI 32.78 kg/m  If your blood pressure (BP) was elevated above 135/85 this visit, please have this repeated by your doctor within one month. ---------------

## 2016-04-17 NOTE — ED Provider Notes (Signed)
[facility] DEPT Provider Note   CSN: [id number] Arrival date & time: 04/17/16  0004     History   Chief Complaint Chief Complaint  Patient presents with  . DVT  . Hand Pain    HPI Caitlin Wall is a 61 y.o. female.  HPI  61 y.o. female with a hx of HTN, presents to the Emergency Department today complaining of left index finger discoloration that she noted around 2000 last night. Pt states that she is concerned for possible blood clot in finger. Pt recently diagnosed yesterday morning with DVT. Started on Xarelto around 1100. Denies trauma that she can remember. No CP/SOB/ABD pain. No N/V. Denies numbness to extremity. No pain on ROM. No fevers. No other symptoms noted.   Past Medical History:  Diagnosis Date  . Asthma, persistent not controlled 07/27/2012  . Breast cancer ([place]) 12/28/14   Left BresastDCIS  . COLONIC POLYPS, HX OF 11/04/2007  . GERD 11/04/2007  . GERD (gastroesophageal reflux disease)   . HAIR LOSS 11/04/2007  . Hx of colonic polyps 1995   adenomatous  . Hyperlipidemia   . HYPERLIPIDEMIA 11/04/2007  . Hypertension   . HYPERTENSION 06/07/2009  . LBBB (left bundle branch block) 07/26/2011  . S/P radiation therapy 02/02/15-03/24/15   left breast 60.4GY    Patient Active Problem List   Diagnosis Date Noted  . Left knee pain 04/16/2016  . Left leg swelling 04/16/2016  . Hemoptysis 04/16/2016  . Right-sided chest pain 04/16/2016  . Acute deep vein thrombosis (DVT) of distal vein of left lower extremity ([place]) 04/16/2016  . Breast cancer of lower-outer quadrant of left female breast ([place]) 01/16/2015  . Back pain, thoracic 02/22/2013  . Lung nodules 10/14/2012  . Seasonal and perennial allergic rhinitis 07/27/2012  . Asthma, mild intermittent, well-controlled 07/27/2012  . LBBB (left bundle branch block) 07/26/2011  . Preventative health care 05/18/2010  . Essential hypertension 06/07/2009  . Hyperlipidemia 11/04/2007  . GERD 11/04/2007  . COLONIC  POLYPS, HX OF 11/04/2007    Past Surgical History:  Procedure Laterality Date  . ABDOMINAL HYSTERECTOMY    . ANTERIOR FUSION CERVICAL SPINE    . bilat ear surgury/hearing loss    . BREAST LUMPECTOMY WITH RADIOACTIVE SEED LOCALIZATION Left 12/28/2014   Procedure: BREAST LUMPECTOMY WITH RADIOACTIVE SEED LOCALIZATION;  Surgeon: Autumn Messing III, MD;  Location: [facility];  Service: General;  Laterality: Left;    OB History    No data available       Home Medications    Prior to Admission medications   Medication Sig Start Date End Date Taking? Authorizing Provider  anastrozole (ARIMIDEX) 1 MG tablet Take 1 tablet (1 mg total) by mouth daily. 04/19/15  Yes Chauncey Cruel, MD  aspirin 81 MG tablet Take 81 mg by mouth daily.     Yes Historical Provider, MD  cholecalciferol (VITAMIN D) 1000 units tablet Take 1 tablet (1,000 Units total) by mouth daily. 06/27/15  Yes Chauncey Cruel, MD  ibuprofen (ADVIL,MOTRIN) 200 MG tablet Take 200 mg by mouth as needed.   Yes Historical Provider, MD  losartan (COZAAR) 50 MG tablet Take 1 tablet (50 mg total) by mouth daily. 10/11/15  Yes Biagio Borg, MD  Omega-3 Fatty Acids (FISH OIL) 1000 MG CAPS Take 1,000 mg by mouth daily. Reported on 05/04/2015   Yes Historical Provider, MD  rivaroxaban (XARELTO) 20 MG TABS tablet Take 1 tablet (20 mg total) by mouth daily with supper. To start  after take xarelto starter pack given at office visit 04/16/16  Yes Biagio Borg, MD    Family History Family History  Problem Relation Age of Onset  . Breast cancer Other   . Cancer Paternal Grandmother     breast  . Lung cancer Father   . Breast cancer Maternal Grandmother   . Coronary artery disease Neg Hx   . Colon cancer Neg Hx   . Pancreatic cancer Neg Hx   . Rectal cancer Neg Hx   . Stomach cancer Neg Hx     Social History Social History  Substance Use Topics  . Smoking status: Former Smoker    Packs/day: 1.00    Years: 20.00    Types:  Cigarettes    Quit date: 08/19/1989  . Smokeless tobacco: Never Used  . Alcohol use No     Allergies   Codeine   Review of Systems Review of Systems ROS reviewed and all are negative for acute change except as noted in the HPI.  Physical Exam Updated Vital Signs BP 105/87 (BP Location: Left Arm)   Pulse 87   Temp 98 F (36.7 C) (Oral)   Resp 17   Ht 5\' 5"  (1.651 m)   Wt 89.4 kg   SpO2 98%   BMI 32.78 kg/m   Physical Exam  Constitutional: She is oriented to person, place, and time. Vital signs are normal. She appears well-developed and well-nourished.  HENT:  Head: Normocephalic and atraumatic.  Right Ear: Hearing normal.  Left Ear: Hearing normal.  Eyes: Conjunctivae and EOM are normal. Pupils are equal, round, and reactive to light.  Neck: Normal range of motion. Neck supple.  Cardiovascular: Normal rate, regular rhythm, normal heart sounds and intact distal pulses.   Pulmonary/Chest: Effort normal and breath sounds normal.  Musculoskeletal: Normal range of motion.  Left index finger with ecchymosis noted on dorsal aspect. Palmar aspect spared. No circumferential swelling. Cap refill <2sec. ROM intact.   Neurological: She is alert and oriented to person, place, and time.  Skin: Skin is warm and dry.  Psychiatric: She has a normal mood and affect. Her speech is normal and behavior is normal. Thought content normal.  Nursing note and vitals reviewed.  ED Treatments / Results  Labs (all labs ordered are listed, but only abnormal results are displayed) Labs Reviewed - No data to display  EKG  EKG Interpretation None      Radiology Dg Chest 2 View  Result Date: 04/16/2016 CLINICAL DATA:  Chest pain. EXAM: CHEST  2 VIEW COMPARISON:  02/15/2015. FINDINGS: Mediastinum and hilar structures are normal. Right apical pleural-parenchymal thickening consistent with scarring. Calcified pulmonary nodule right apex consistent with granuloma. No pleural effusion or  pneumothorax. No acute bony abnormality. Degenerative changes thoracic spine. IMPRESSION: Right apical pleural-parenchymal thickening again noted consistent scarring. No acute abnormality identified. No acute pulmonary infiltrate. Electronically Signed   By: Marcello Moores  Register   On: 04/16/2016 10:53   Ct Angio Chest Pe W Or Wo Contrast  Result Date: 04/16/2016 CLINICAL DATA:  Right chest pain, left lower extremity deep venous thrombosis detected today, cough with some blood, history of left breast carcinoma with lumpectomy and radiation in 2016 EXAM: CT ANGIOGRAPHY CHEST WITH CONTRAST TECHNIQUE: Multidetector CT imaging of the chest was performed using the standard protocol during bolus administration of intravenous contrast. Multiplanar CT image reconstructions and MIPs were obtained to evaluate the vascular anatomy. CONTRAST:  75 cc Isovue 370 COMPARISON:  Chest x-ray 04/16/2016 and CT  chest of 08/04/2013 FINDINGS: Cardiovascular: The pulmonary artery all well opacified. There is no evidence of acute pulmonary embolism. The thoracic aorta also is well opacified with mild thoracic aortic atherosclerosis present. The heart is within normal limits in size and no pericardial effusion is seen. The mid ascending thoracic aorta measures 30 mm in diameter. Mediastinum/Nodes: No mediastinal or hilar adenopathy is seen. The thyroid gland is unremarkable. No hiatal hernia is seen with certainty. Lungs/Pleura: Changes of centrilobular and paraseptal emphysema are noted particularly involving the upper lobes. A 4 mm noncalcified nodule within the right upper lobe abuts the major fissure and is seen on image 38 series 4, being stable. A tiny calcified granuloma in deep in the posterior sulcus of the left lower lobe also is stable, as is a calcified granuloma near the right lung apex on image 32. No new parenchymal nodule is seen. No parenchymal infiltrate is noted and there is no evidence of pleural effusion. The central  airway is patent. Upper Abdomen: The portion of the upper abdomen that is visualized is unremarkable. Musculoskeletal: There is degenerative change throughout the mid to lower thoracic spine. No compression deformity is seen. Review of the MIP images confirms the above findings. IMPRESSION: 1. No evidence of acute pulmonary embolism. 2. Centrilobular and paraseptal emphysema particularly in the upper lobes. 3. Stable 4 mm noncalcified nodule in the right upper lobe with stable small calcified granulomas as well. No new parenchymal lesion is seen. Electronically Signed   By: Ivar Drape M.D.   On: 04/16/2016 16:00   Procedures Procedures (including critical care time)  Medications Ordered in ED Medications - No data to display   Initial Impression / Assessment and Plan / ED Course  I have reviewed the triage vital signs and the nursing notes.  Pertinent labs & imaging results that were available during my care of the patient were reviewed by me and considered in my medical decision making (see chart for details).  Final Clinical Impressions(s) / ED Diagnoses     {I have reviewed the relevant previous healthcare records.  {I obtained HPI from historian.   ED Course:  Assessment: Pt is a 61yF with hx DVT who presents with left index finger discoloration. Recently started on Xarelto for DVT due to recent diagnosis yesterday morning. Pt concern for blood clot in finger. On exam, pt in NAD. Nontoxic/nonseptic appearing. VSS. Afebrile. Lungs CTA. Heart RRR. Left index finger with ecchymosis noted on dorsal aspect. Palmar aspect spared. No swelling. Cap refill <2sec. No clinical indication for arterial occlusion. Likely bruising from trauma. Doubt clot. Plan is to DC home with close follow up to PCP. At time of discharge, Patient is in no acute distress. Vital Signs are stable. Patient is able to ambulate. Patient able to tolerate PO.   Disposition/Plan:  DC Home Additional Verbal discharge  instructions given and discussed with patient.  Pt Instructed to f/u with PCP in the next week for evaluation and treatment of symptoms. Return precautions given Pt acknowledges and agrees with plan  Supervising Physician April Palumbo, MD  Final diagnoses:  Discoloration of skin of finger    New Prescriptions New Prescriptions   No medications on file     Shary Decamp, Hershal Coria 04/17/16 [id number]    April Palumbo, MD 04/17/16 [phone]

## 2016-04-17 NOTE — ED Triage Notes (Addendum)
Pt comes with complaints of left leg pain from a blood clot she was diagnosed with today.  Also complains of left finger pointer pain.  States she might have a blood clot there as well.  Finger swollen and tight and has some bruising to it.  Pt states she did not injure it.

## 2016-04-18 LAB — RFX DRVVT SCR W/RFLX CONF 1:1 MIX: DRVVT SCREEN: 38 s (ref <=45)

## 2016-04-18 LAB — RFX PTT-LA W/RFX TO HEX PHASE CONF: PTT-LA Screen: 35 s (ref <=40)

## 2016-04-19 ENCOUNTER — Encounter: Payer: Self-pay | Admitting: Internal Medicine

## 2016-04-19 ENCOUNTER — Other Ambulatory Visit: Payer: Self-pay | Admitting: Internal Medicine

## 2016-04-19 DIAGNOSIS — R760 Raised antibody titer: Secondary | ICD-10-CM

## 2016-04-22 LAB — HYPERCOAGULABLE PANEL, COMPREHENSIVE
AntiThromb III Func: 116 %{activity} (ref 80–120)
Anticardiolipin IgA: <11 [APL'U]
Anticardiolipin IgG: <14 [GPL'U]
Anticardiolipin IgM: 14 [MPL'U] — ABNORMAL HIGH
Beta-2 Glyco I IgG: 9 SGU
Beta-2-Glycoprotein I IgA: <9 SAU
Beta-2-Glycoprotein I IgM: <9 SMU
PROTEIN S ANTIGEN, TOTAL: 114 % (ref 70–140)
Protein C Activity: 178 % (ref 70–180)
Protein C Antigen: 136 % (ref 70–140)
Protein S Activity: 117 % (ref 60–140)

## 2016-04-23 ENCOUNTER — Telehealth: Payer: Self-pay | Admitting: Internal Medicine

## 2016-04-23 NOTE — Telephone Encounter (Signed)
-----   Message from Biagio Borg, MD sent at 04/16/2016  8:14 PM EST ----- OK to let pt know - CT chest negative for PE; no change in treatment, should continue xarelto as prescribed

## 2016-04-23 NOTE — Telephone Encounter (Signed)
Called pt about CT Chest---normal and continue Rx. xarelto by Dr. Jenny Reichmann

## 2016-04-30 ENCOUNTER — Other Ambulatory Visit: Payer: Self-pay | Admitting: Oncology

## 2016-04-30 DIAGNOSIS — C50512 Malignant neoplasm of lower-outer quadrant of left female breast: Secondary | ICD-10-CM

## 2016-05-04 ENCOUNTER — Other Ambulatory Visit: Payer: Self-pay | Admitting: Oncology

## 2016-05-07 ENCOUNTER — Other Ambulatory Visit: Payer: Self-pay | Admitting: *Deleted

## 2016-05-07 ENCOUNTER — Telehealth: Payer: Self-pay | Admitting: *Deleted

## 2016-05-07 ENCOUNTER — Other Ambulatory Visit (HOSPITAL_BASED_OUTPATIENT_CLINIC_OR_DEPARTMENT_OTHER): Payer: BLUE CROSS/BLUE SHIELD

## 2016-05-07 DIAGNOSIS — C50512 Malignant neoplasm of lower-outer quadrant of left female breast: Secondary | ICD-10-CM

## 2016-05-07 LAB — COMPREHENSIVE METABOLIC PANEL
ALT: 27 U/L (ref 0–55)
AST: 30 U/L (ref 5–34)
Albumin: 3.9 g/dL (ref 3.5–5.0)
Alkaline Phosphatase: 173 U/L — ABNORMAL HIGH (ref 40–150)
Anion Gap: 9 mEq/L (ref 3–11)
BUN: 13.1 mg/dL (ref 7.0–26.0)
CO2: 27 meq/L (ref 22–29)
Calcium: 10.6 mg/dL — ABNORMAL HIGH (ref 8.4–10.4)
Chloride: 108 mEq/L (ref 98–109)
Creatinine: 0.8 mg/dL (ref 0.6–1.1)
EGFR: 76 mL/min/{1.73_m2} — AB (ref >90)
GLUCOSE: 92 mg/dL (ref 70–140)
POTASSIUM: 4 meq/L (ref 3.5–5.1)
SODIUM: 144 meq/L (ref 136–145)
TOTAL PROTEIN: 7.6 g/dL (ref 6.4–8.3)
Total Bilirubin: 1.18 mg/dL (ref 0.20–1.20)

## 2016-05-07 LAB — CBC WITH DIFFERENTIAL/PLATELET
BASO%: 0.4 % (ref 0.0–2.0)
Basophils Absolute: 0 10*3/uL (ref 0.0–0.1)
EOS%: 1.8 % (ref 0.0–7.0)
Eosinophils Absolute: 0.1 10*3/uL (ref 0.0–0.5)
HCT: 43 % (ref 34.8–46.6)
HGB: 14.4 g/dL (ref 11.6–15.9)
LYMPH%: 23.5 % (ref 14.0–49.7)
MCH: 30.1 pg (ref 25.1–34.0)
MCHC: 33.6 g/dL (ref 31.5–36.0)
MCV: 89.5 fL (ref 79.5–101.0)
MONO#: 0.7 10*3/uL (ref 0.1–0.9)
MONO%: 9.3 % (ref 0.0–14.0)
NEUT%: 65 % (ref 38.4–76.8)
NEUTROS ABS: 4.8 10*3/uL (ref 1.5–6.5)
Platelets: 214 10*3/uL (ref 145–400)
RBC: 4.8 10*6/uL (ref 3.70–5.45)
RDW: 13.5 % (ref 11.2–14.5)
WBC: 7.4 10*3/uL (ref 3.9–10.3)
lymph#: 1.7 10*3/uL (ref 0.9–3.3)

## 2016-05-07 NOTE — Telephone Encounter (Signed)
No entry 

## 2016-05-08 LAB — LACTATE DEHYDROGENASE: LDH: 194 U/L (ref 125–245)

## 2016-05-09 ENCOUNTER — Telehealth: Payer: Self-pay | Admitting: Internal Medicine

## 2016-05-09 NOTE — Telephone Encounter (Signed)
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

## 2016-05-09 NOTE — Telephone Encounter (Signed)
Pt would like info on why she was referred to oncologists([place]). Please call back.

## 2016-05-13 ENCOUNTER — Telehealth: Payer: Self-pay | Admitting: Internal Medicine

## 2016-05-13 NOTE — Telephone Encounter (Signed)
Pt called stating her insurance is not paying for some of the lab that Dr. Jenny Reichmann did on 04/16/2016 : Actvm and [id number]. Please advise, its has something to do with coding.

## 2016-05-17 ENCOUNTER — Ambulatory Visit: Payer: BLUE CROSS/BLUE SHIELD | Admitting: Oncology

## 2016-05-17 ENCOUNTER — Ambulatory Visit (HOSPITAL_BASED_OUTPATIENT_CLINIC_OR_DEPARTMENT_OTHER): Payer: BLUE CROSS/BLUE SHIELD | Admitting: Oncology

## 2016-05-17 VITALS — BP 149/56 | HR 85 | Temp 98.0°F | Resp 18 | Ht 65.0 in | Wt 198.3 lb

## 2016-05-17 DIAGNOSIS — Z170 Estrogen receptor positive status [ER+]: Secondary | ICD-10-CM | POA: Diagnosis not present

## 2016-05-17 DIAGNOSIS — D0512 Intraductal carcinoma in situ of left breast: Secondary | ICD-10-CM

## 2016-05-17 DIAGNOSIS — C50512 Malignant neoplasm of lower-outer quadrant of left female breast: Secondary | ICD-10-CM

## 2016-05-17 DIAGNOSIS — I824Z2 Acute embolism and thrombosis of unspecified deep veins of left distal lower extremity: Secondary | ICD-10-CM | POA: Diagnosis not present

## 2016-05-17 NOTE — Progress Notes (Signed)
Caitlin Wall:(336) [phone] Fax:([phone]) [phone]     ID: Caitlin Wall DOB: Jun 22, 1955  MR#: [id number]  VHQ#:[id number]  Patient Care Team: Biagio Borg, MD as PCP - General Chauncey Cruel, MD as Consulting Physician (Oncology) Kyung Rudd, MD as Consulting Physician (Radiation Oncology) Autumn Messing III, MD as Consulting Physician (General Surgery) Sylvan Cheese, NP as Nurse Practitioner (Hematology and Oncology) PCP: [name], MD SU: Star Age MD OTHER MD: Keturah Barre MD, Kyung Rudd MD  CHIEF COMPLAINT: Ductal carcinoma in situ  CURRENT TREATMENT: Anastrozole   BREAST CANCER HISTORY:  From the original intake note:   [name] [facility]. She has subpectoral saline implants in place. There was a possible asymmetry in the left breast and she was recalled for left diagnostic mammography with tomosynthesis and left breast ultrasonography 11/03/2014. The breast density was category C. In the left breast lower outer quadrant there was an irregular mass which was not palpable and which by ultrasonography measured 0.6 cm. The left axilla was sonographically benign. Biopsy of this mass 11/09/2014 showed ([id number]) fibroadipose adipose tissue. This was felt to be discordant.  Accordingly the patient was referred to surgery and she underwent left breast radioactive seed localized lumpectomy 12/28/2014. The pathology from that procedure (([id number]) showed ductal carcinoma in situ, high-grade, measuring 0.2 cm. This was less than 0.1 cm from the posterior margin. The cells was estrogen receptor positive at 95%, with strong staining intensity, progesterone receptor positive at 30% with moderate staining intensity.  Her subsequent history is as detailed below.   INTERVAL HISTORY: [name] returns today for follow-up of her ductal carcinoma in situ and more importantly for discussion and evaluation  of a recent DVT. To review: She presented to Dr. Jenny Reichmann on 04/16/2016 with a complaint of discomfort behind her left knee area he ordered a Doppler ultrasound of her left lower extremity and this showed acute deep vein thrombosis involving the gastrocnemius vein on the left. There was no superficial thrombus and nothing of concern in the right lower extremity. A CT angiogram obtained on the same day found no evidence of pulmonary embolus. She was started on rivaroxaban and a coagulopathy panel was obtained which was normal for anti-thrombin, protein C, protein S, and negative for lupus anticoagulant. Beta-2 glycoprotein studies were negative. Anti-cardiolipin IgA and IgG were low, the IgM was in the indeterminate range at 14. Factor V Leiden and prothrombin mutation studies are pending. She was referred back to [place] specifically to discuss length of anticoagulation in this setting.  From a breast cancer point of view, she continues on anastrozole, with good tolerance.Hot flashes and vaginal dryness are not a major issue. She never developed the arthralgias or myalgias that many patients can experience on this medication. She obtains it at a good price.     REVIEW OF SYSTEMS: She is working full-time in Press [employer]. 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 There has been no bleeding complications and the swelling in her left lower extremity ankle swelling has resolved. A detailed review of systems today was otherwise stable   PAST MEDICAL HISTORY: Past Medical History:  Diagnosis Date  . Asthma, persistent not controlled 07/27/2012  . Breast cancer ([place]) 12/28/14   Left BresastDCIS  . COLONIC POLYPS, HX OF 11/04/2007  . GERD 11/04/2007  . GERD (gastroesophageal reflux disease)   .  HAIR LOSS 11/04/2007  . Hx of colonic polyps 1995   adenomatous  . Hyperlipidemia   . HYPERLIPIDEMIA 11/04/2007  . Hypertension   . HYPERTENSION 06/07/2009  .  LBBB (left bundle branch block) 07/26/2011  . S/P radiation therapy 02/02/15-03/24/15   left breast 60.4GY    PAST SURGICAL HISTORY: Past Surgical History:  Procedure Laterality Date  . ABDOMINAL HYSTERECTOMY    . ANTERIOR FUSION CERVICAL SPINE    . bilat ear surgury/hearing loss    . BREAST LUMPECTOMY WITH RADIOACTIVE SEED LOCALIZATION Left 12/28/2014   Procedure: BREAST LUMPECTOMY WITH RADIOACTIVE SEED LOCALIZATION;  Surgeon: Autumn Messing III, MD;  Location: [facility];  Service: General;  Laterality: Left;    FAMILY HISTORY Family History  Problem Relation Age of Onset  . Breast cancer Other   . Cancer Paternal Grandmother     breast  . Lung cancer Father   . Breast cancer Maternal Grandmother   . Coronary artery disease Neg Hx   . Colon cancer Neg Hx   . Pancreatic cancer Neg Hx   . Rectal cancer Neg Hx   . Stomach cancer Neg Hx    the patient's father died from lung cancer in the setting of tobacco abuse at age 61. [name] died from "old age" at age 29. The patient had one brother, 3 sisters. The brother had" I cancer" requiring enucleation--the patient does not know whether this was a primary ocular melanoma. [name] was diagnosed with breast cancer but the patient does not know at what age. There is no other history of breast or ovarian cancer in the family  GYNECOLOGIC HISTORY:  No LMP recorded. Patient has had a hysterectomy. Menarche age 62, first live birth age 7. The patient is GX P3. She is status post hysterectomy without salpingo-oophorectomy. She did not take hormone replacement. She did use oral contraceptives remotely without any complications.  SOCIAL HISTORY:  [name] works in accounts payable. Her husband [name] is disabled because of back problems. [name] is a Marine scientist working in rehabilitation in [place]. [name] lives in [place] where he works as a Games developer. [name] lives in  [place] where he works as a Emergency planning/management officer. The patient has 4 grandchildren. She is not a Ambulance person.    ADVANCED DIRECTIVES: Not in place   HEALTH MAINTENANCE: Social History  Substance Use Topics  . Smoking status: Former Smoker    Packs/day: 1.00    Years: 20.00    Types: Cigarettes    Quit date: 08/19/1989  . Smokeless tobacco: Never Used  . Alcohol use No     Colonoscopy:  PAP:  Bone density:  Lipid panel:  Allergies  Allergen Reactions  . Codeine     REACTION: nausea,    Current Outpatient Prescriptions  Medication Sig Dispense Refill  . anastrozole (ARIMIDEX) 1 MG tablet TAKE ONE TABLET BY MOUTH ONCE DAILY 90 tablet 4  . aspirin 81 MG tablet Take 81 mg by mouth daily.      . cholecalciferol (VITAMIN D) 1000 units tablet Take 1 tablet (1,000 Units total) by mouth daily. 90 tablet 4  . ibuprofen (ADVIL,MOTRIN) 200 MG tablet Take 200 mg by mouth as needed.    Marland Kitchen losartan (COZAAR) 50 MG tablet Take 1 tablet (50 mg total) by mouth daily. 90 tablet 3  . Omega-3 Fatty Acids (FISH OIL) 1000 MG CAPS Take 1,000 mg by mouth daily. Reported on 05/04/2015    . rivaroxaban (  XARELTO) 20 MG TABS tablet Take 1 tablet (20 mg total) by mouth daily with supper. To start after take xarelto starter pack given at office visit 30 tablet 4   No current facility-administered medications for this visit.     OBJECTIVE: Middle-aged White woman Would appear stated age  18:   05/17/16 1438  BP: (!) 149/56  Pulse: 85  Resp: 18  Temp: 98 F (36.7 C)     Body mass index is 33 kg/m.    ECOG FS:1 - Symptomatic but completely ambulatory  Sclerae unicteric, pupils round and equal Oropharynx clear and moist No cervical or supraclavicular adenopathy Lungs no rales or rhonchi Heart regular rate and rhythm Abd soft, nontender, positive bowel sounds MSK no focal spinal tenderness, no left lower extremity lymphedema Neuro: nonfocal, well oriented, appropriate affect Breasts: The  patient wears nipple studs bilaterally, which are unchanged from prior. The left breast is status post lumpectomy followed by radiation with no evidence of local recurrence. Both axillae are benign.  LAB RESULTS:  CMP     Component Value Date/Time   NA 144 05/07/2016 1601   K 4.0 05/07/2016 1601   CL 106 10/11/2015 0931   CO2 27 05/07/2016 1601   GLUCOSE 92 05/07/2016 1601   BUN 13.1 05/07/2016 1601   CREATININE 0.8 05/07/2016 1601   CALCIUM 10.6 (H) 05/07/2016 1601   PROT 7.6 05/07/2016 1601   ALBUMIN 3.9 05/07/2016 1601   AST 30 05/07/2016 1601   ALT 27 05/07/2016 1601   ALKPHOS 173 (H) 05/07/2016 1601   BILITOT 1.18 05/07/2016 1601   GFRNONAA >60 12/23/2014 1330   GFRAA >60 12/23/2014 1330    INo results found for: SPEP, UPEP  Lab Results  Component Value Date   WBC 7.4 05/07/2016   NEUTROABS 4.8 05/07/2016   HGB 14.4 05/07/2016   HCT 43.0 05/07/2016   MCV 89.5 05/07/2016   PLT 214 05/07/2016      Chemistry      Component Value Date/Time   NA 144 05/07/2016 1601   K 4.0 05/07/2016 1601   CL 106 10/11/2015 0931   CO2 27 05/07/2016 1601   BUN 13.1 05/07/2016 1601   CREATININE 0.8 05/07/2016 1601      Component Value Date/Time   CALCIUM 10.6 (H) 05/07/2016 1601   ALKPHOS 173 (H) 05/07/2016 1601   AST 30 05/07/2016 1601   ALT 27 05/07/2016 1601   BILITOT 1.18 05/07/2016 1601       No results found for: LABCA2  No components found for: LABCA125  No results for input(s): INR in the last 168 hours.  Urinalysis    Component Value Date/Time   COLORURINE YELLOW 10/11/2015 0931   APPEARANCEUR CLEAR 10/11/2015 0931   LABSPEC 1.010 10/11/2015 0931   PHURINE 6.5 10/11/2015 0931   GLUCOSEU NEGATIVE 10/11/2015 0931   HGBUR NEGATIVE 10/11/2015 0931   BILIRUBINUR NEGATIVE 10/11/2015 0931   KETONESUR NEGATIVE 10/11/2015 0931   UROBILINOGEN 1.0 10/11/2015 0931   NITRITE NEGATIVE 10/11/2015 0931   LEUKOCYTESUR NEGATIVE 10/11/2015 0931    STUDIES: No  results found.  ASSESSMENT: 61 y.o. [place] woman status post left lumpectomy 12/28/2014 for ductal carcinoma in situ, high-grade measuring 0.2 cm, estrogen and progesterone receptor positive, with close but negative margins  (1) adjuvant radiation completed 03/24/2015  (2) anastrozole started 04/19/2015   (a) bone density 03/01/2016 shows a T score of -1.2  (3) mildly abnormal hepatic function panel: negative workup for hepatitis B and C, normal alpha-1-anti-trypsin  (  4) left lower extremity DVT diagnosed by Doppler ultrasonography 04/16/2016, with negative CT angiogram chest  (a) on Xarelto starting 04/16/2016   PLAN We discussed her clotting situation, and she understands the anti-cardiolipin IgM level noted on the hypercoagulable panel are most likely is not significant. I think a three-month course of rivaroxaban for her below the knee left leg clot should be adequate. She is in agreement with this plan and understands that in the interim she should not be on an NSAID S, but will need to take Tylenol only for pain. She can continue on her "baby aspirin" as before  She will see me again late May and we will repeat her left lower extremity Doppler ultrasound just before that visit, as well as lab work. The factor V Leiden and prothrombin gene mutation reports of course should be available well before that  As far as the breast cancer is concerned we reviewed the fact that this was not invasive. She also understands of the anastrozole is not associated with any clotting complications when compared to placebo. The plan continues to be to continue anastrozole for a total of 5 years  She knows to call for any problems that may develop before her next visit here.  :Chauncey Cruel, MD   05/17/2016 2:55 PM Medical Oncology and Hematology [facility] [place], [place] [address] Tel. [phone]    Fax. [phone]

## 2016-05-29 NOTE — Telephone Encounter (Signed)
Email to coding, insurance denied as "experimental".

## 2016-06-03 ENCOUNTER — Ambulatory Visit (HOSPITAL_COMMUNITY)
Admission: EM | Admit: 2016-06-03 | Discharge: 2016-06-03 | Disposition: A | Discharge status: Home / Self Care | Payer: BLUE CROSS/BLUE SHIELD | Attending: Family Medicine | Admitting: Family Medicine

## 2016-06-03 ENCOUNTER — Encounter (HOSPITAL_COMMUNITY): Payer: Self-pay | Admitting: Emergency Medicine

## 2016-06-03 DIAGNOSIS — N390 Urinary tract infection, site not specified: Secondary | ICD-10-CM

## 2016-06-03 DIAGNOSIS — R21 Rash and other nonspecific skin eruption: Secondary | ICD-10-CM | POA: Diagnosis not present

## 2016-06-03 LAB — POCT URINALYSIS DIP (DEVICE)
BILIRUBIN URINE: NEGATIVE
GLUCOSE, UA: NEGATIVE mg/dL
Ketones, ur: NEGATIVE mg/dL
NITRITE: NEGATIVE
Protein, ur: NEGATIVE mg/dL
Specific Gravity, Urine: >=1.03 (ref 1.005–1.030)
UROBILINOGEN UA: 0.2 mg/dL (ref 0.0–1.0)
pH: 5.5 (ref 5.0–8.0)

## 2016-06-03 MED ORDER — SULFAMETHOXAZOLE-TRIMETHOPRIM 800-160 MG PO TABS: 1.0000 | ORAL_TABLET | Freq: Two times a day (BID) | ORAL | 0 refills | Status: AC

## 2016-06-03 MED ORDER — IVERMECTIN 3 MG PO TABS: 12.0000 mg | ORAL_TABLET | Freq: Once | ORAL | 0 refills | Status: AC

## 2016-06-03 NOTE — ED Triage Notes (Signed)
The patient presented to the [facility] with a complaint of lower abdominal pain x 2 weeks with an odor to her urine. The patient also complained about a rash on her arms x 3 days.

## 2016-06-03 NOTE — ED Provider Notes (Signed)
Jay    CSN: [id number] Arrival date & time: 06/03/16  1705     History   Chief Complaint Chief Complaint  Patient presents with  . Abdominal Cramping  . Rash    HPI Caitlin Wall is a 61 y.o. female.   The patient presented to the [facility] with a complaint of lower abdominal pain x 2 weeks with an odor to her urine. The patient also complained about a rash on her arms x 3 days.  Patient states that her bowel pain is in her suprapubic and groin regions bilaterally. She's having no dysuria or blood in urine.  Patient's had the rash on both arms, worse on the right. 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 Her husband who sleeps with her has not had any bites.      Past Medical History:  Diagnosis Date  . Asthma, persistent not controlled 07/27/2012  . Breast cancer ([place]) 12/28/14   Left BresastDCIS  . COLONIC POLYPS, HX OF 11/04/2007  . GERD 11/04/2007  . GERD (gastroesophageal reflux disease)   . HAIR LOSS 11/04/2007  . Hx of colonic polyps 1995   adenomatous  . Hyperlipidemia   . HYPERLIPIDEMIA 11/04/2007  . Hypertension   . HYPERTENSION 06/07/2009  . LBBB (left bundle branch block) 07/26/2011  . S/P radiation therapy 02/02/15-03/24/15   left breast 60.4GY    Patient Active Problem List   Diagnosis Date Noted  . Left knee pain 04/16/2016  . Left leg swelling 04/16/2016  . Hemoptysis 04/16/2016  . Right-sided chest pain 04/16/2016  . Acute deep vein thrombosis (DVT) of distal vein of left lower extremity ([place]) 04/16/2016  . Malignant neoplasm of lower-outer quadrant of left breast of female, estrogen receptor positive ([place]) 01/16/2015  . Back pain, thoracic 02/22/2013  . Lung nodules 10/14/2012  . Seasonal and perennial allergic rhinitis 07/27/2012  . Asthma, mild intermittent, well-controlled 07/27/2012  . LBBB (left bundle branch block) 07/26/2011  . Preventative health care 05/18/2010  . Essential hypertension  06/07/2009  . Hyperlipidemia 11/04/2007  . GERD 11/04/2007  . COLONIC POLYPS, HX OF 11/04/2007    Past Surgical History:  Procedure Laterality Date  . ABDOMINAL HYSTERECTOMY    . ANTERIOR FUSION CERVICAL SPINE    . bilat ear surgury/hearing loss    . BREAST LUMPECTOMY WITH RADIOACTIVE SEED LOCALIZATION Left 12/28/2014   Procedure: BREAST LUMPECTOMY WITH RADIOACTIVE SEED LOCALIZATION;  Surgeon: Autumn Messing III, MD;  Location: [place];  Service: General;  Laterality: Left;    OB History    No data available       Home Medications    Prior to Admission medications   Medication Sig Start Date End Date Taking? Authorizing Provider  anastrozole (ARIMIDEX) 1 MG tablet TAKE ONE TABLET BY MOUTH ONCE DAILY 05/06/16   Chauncey Cruel, MD  aspirin 81 MG tablet Take 81 mg by mouth daily.      Historical Provider, MD  cholecalciferol (VITAMIN D) 1000 units tablet Take 1 tablet (1,000 Units total) by mouth daily. 06/27/15   Chauncey Cruel, MD  ibuprofen (ADVIL,MOTRIN) 200 MG tablet Take 200 mg by mouth as needed.    Historical Provider, MD  ivermectin (STROMECTOL) 3 MG TABS tablet Take 4 tablets (12 mg total) by mouth once. 06/03/16 06/03/16  Robyn Haber, MD  losartan (COZAAR) 50 MG tablet Take 1 tablet (50 mg total) by mouth daily. 10/11/15   Biagio Borg, MD  Omega-3 Fatty Acids (FISH OIL) 1000 MG CAPS Take 1,000 mg by mouth daily. Reported on 05/04/2015    Historical Provider, MD  rivaroxaban (XARELTO) 20 MG TABS tablet Take 1 tablet (20 mg total) by mouth daily with supper. To start after take xarelto starter pack given at office visit 04/16/16   Biagio Borg, MD  sulfamethoxazole-trimethoprim (BACTRIM DS,SEPTRA DS) 800-160 MG tablet Take 1 tablet by mouth 2 (two) times daily. 06/03/16 06/10/16  Robyn Haber, MD    Family History Family History  Problem Relation Age of Onset  . Breast cancer Other   . Cancer Paternal Grandmother     breast  . Lung cancer Father   . Breast  cancer Maternal Grandmother   . Coronary artery disease Neg Hx   . Colon cancer Neg Hx   . Pancreatic cancer Neg Hx   . Rectal cancer Neg Hx   . Stomach cancer Neg Hx     Social History Social History  Substance Use Topics  . Smoking status: Former Smoker    Packs/day: 1.00    Years: 20.00    Types: Cigarettes    Quit date: 08/19/1989  . Smokeless tobacco: Never Used  . Alcohol use No     Allergies   Codeine   Review of Systems Review of Systems  Constitutional: Negative.   HENT: Negative.   Gastrointestinal: Positive for nausea.  Skin: Positive for rash.     Physical Exam Triage Vital Signs ED Triage Vitals  Enc Vitals Group     BP 06/03/16 1713 (!) 154/74     Pulse Rate 06/03/16 1713 75     Resp 06/03/16 1713 18     Temp 06/03/16 1713 97.7 F (36.5 C)     Temp Source 06/03/16 1713 Oral     SpO2 06/03/16 1713 100 %     Weight --      Height --      Head Circumference --      Peak Flow --      Pain Score 06/03/16 1712 7     Pain Loc --      Pain Edu? --      Excl. in [place]? --    No data found.   Updated Vital Signs BP (!) 154/74 (BP Location: Right Arm)   Pulse 75   Temp 97.7 F (36.5 C) (Oral)   Resp 18   SpO2 100%    Physical Exam  Constitutional: She is oriented to person, place, and time. She appears well-developed and well-nourished.  HENT:  Right Ear: External ear normal.  Left Ear: External ear normal.  Mouth/Throat: Oropharynx is clear and moist.  Eyes: Conjunctivae and EOM are normal. Pupils are equal, round, and reactive to light.  Neck: Normal range of motion. Neck supple.  Cardiovascular: Normal rate, regular rhythm and normal heart sounds.   Pulmonary/Chest: Effort normal and breath sounds normal.  Abdominal: Soft. Bowel sounds are normal. There is tenderness.  Mild bilateral suprapubic tenderness with deep palpation  Musculoskeletal: Normal range of motion.  Neurological: She is alert and oriented to person, place, and time.    Skin: Skin is warm and dry.  Patient has 5x1 cm wheals with central vesicles on her right triceps, 1 -  5 x 1 cm wheal on her left triceps, and 1 - 5 x 1 cm wheal on the medial right scapula.  Nursing note and vitals reviewed.    UC Treatments / Results  Labs (all labs ordered  are listed, but only abnormal results are displayed) Labs Reviewed  POCT URINALYSIS DIP (DEVICE) - Abnormal; Notable for the following:       Result Value   Hgb urine dipstick TRACE (*)    Leukocytes, UA TRACE (*)    All other components within normal limits    EKG  EKG Interpretation None       Radiology No results found.  Procedures Procedures (including critical care time)  Medications Ordered in UC Medications - No data to display   Initial Impression / Assessment and Plan / UC Course  I have reviewed the triage vital signs and the nursing notes.  Pertinent labs & imaging results that were available during my care of the patient were reviewed by me and considered in my medical decision making (see chart for details).     Final Clinical Impressions(s) / UC Diagnoses   Final diagnoses:  Rash  Lower urinary tract infectious disease    New Prescriptions New Prescriptions   IVERMECTIN (STROMECTOL) 3 MG TABS TABLET    Take 4 tablets (12 mg total) by mouth once.   SULFAMETHOXAZOLE-TRIMETHOPRIM (BACTRIM DS,SEPTRA DS) 800-160 MG TABLET    Take 1 tablet by mouth 2 (two) times daily.     Robyn Haber, MD 06/03/16 [facility]

## 2016-06-03 NOTE — Discharge Instructions (Signed)
The rash is most consistent with bedbug bites.  Wash the sheets and take the ivermectin.  If new rash forms, see your primary care doctor  Urine shows a mild urinary infection.  Take the Sulfa medicine for 5 days twice a day.

## 2016-06-10 NOTE — Telephone Encounter (Signed)
Patient has called back.  Told her that Caitlin Wall was trying to get an appeal process going.  I have asked patient to contact her insurance company as well to get any forms she may need to complete for the process.

## 2016-06-11 NOTE — Telephone Encounter (Signed)
Email to billing (05/29/16 and 06/11/16) to provide assistance to patient with [employer], may need appeal per coding.

## 2016-06-26 ENCOUNTER — Ambulatory Visit: Payer: BLUE CROSS/BLUE SHIELD | Admitting: Oncology

## 2016-06-28 NOTE — Telephone Encounter (Signed)
Reached out to patient.  She states she has not heard from billing.  States she did reach out to her insurance company to start an appeal.  Patient states there is a number for a provider to process an appeal as well.  States she will call back next week with that number.

## 2016-07-01 ENCOUNTER — Telehealth: Payer: Self-pay | Admitting: Internal Medicine

## 2016-07-01 NOTE — Telephone Encounter (Signed)
[phone] Fax to doctors office for appeal  List this case Number [id number]

## 2016-07-01 NOTE — Telephone Encounter (Signed)
Patient states appeal can be made at [phone].  Case Number [id number].  Do I need to forward to billing?

## 2016-07-03 ENCOUNTER — Other Ambulatory Visit: Payer: Self-pay | Admitting: Adult Health

## 2016-07-03 DIAGNOSIS — Z170 Estrogen receptor positive status [ER+]: Principal | ICD-10-CM

## 2016-07-03 DIAGNOSIS — C50512 Malignant neoplasm of lower-outer quadrant of left female breast: Secondary | ICD-10-CM

## 2016-07-04 ENCOUNTER — Other Ambulatory Visit (HOSPITAL_BASED_OUTPATIENT_CLINIC_OR_DEPARTMENT_OTHER): Payer: BLUE CROSS/BLUE SHIELD

## 2016-07-04 DIAGNOSIS — C50512 Malignant neoplasm of lower-outer quadrant of left female breast: Secondary | ICD-10-CM | POA: Diagnosis not present

## 2016-07-04 DIAGNOSIS — Z170 Estrogen receptor positive status [ER+]: Principal | ICD-10-CM

## 2016-07-04 LAB — COMPREHENSIVE METABOLIC PANEL
ALBUMIN: 3.8 g/dL (ref 3.5–5.0)
ALK PHOS: 172 U/L — AB (ref 40–150)
ALT: 17 U/L (ref 0–55)
AST: 19 U/L (ref 5–34)
Anion Gap: 8 mEq/L (ref 3–11)
BILIRUBIN TOTAL: 0.94 mg/dL (ref 0.20–1.20)
BUN: 16 mg/dL (ref 7.0–26.0)
CALCIUM: 9.9 mg/dL (ref 8.4–10.4)
CO2: 28 mEq/L (ref 22–29)
Chloride: 108 mEq/L (ref 98–109)
Creatinine: 0.9 mg/dL (ref 0.6–1.1)
EGFR: 73 mL/min/{1.73_m2} — AB (ref >90)
GLUCOSE: 101 mg/dL (ref 70–140)
Potassium: 4.2 mEq/L (ref 3.5–5.1)
SODIUM: 145 meq/L (ref 136–145)
TOTAL PROTEIN: 7.3 g/dL (ref 6.4–8.3)

## 2016-07-04 LAB — CBC WITH DIFFERENTIAL/PLATELET
BASO%: 0.7 % (ref 0.0–2.0)
Basophils Absolute: 0.1 10*3/uL (ref 0.0–0.1)
EOS ABS: 0.2 10*3/uL (ref 0.0–0.5)
EOS%: 2.7 % (ref 0.0–7.0)
HEMATOCRIT: 43.4 % (ref 34.8–46.6)
HEMOGLOBIN: 14.8 g/dL (ref 11.6–15.9)
LYMPH#: 1.7 10*3/uL (ref 0.9–3.3)
LYMPH%: 24.6 % (ref 14.0–49.7)
MCH: 30.9 pg (ref 25.1–34.0)
MCHC: 34.1 g/dL (ref 31.5–36.0)
MCV: 90.6 fL (ref 79.5–101.0)
MONO#: 0.7 10*3/uL (ref 0.1–0.9)
MONO%: 10 % (ref 0.0–14.0)
NEUT%: 62 % (ref 38.4–76.8)
NEUTROS ABS: 4.2 10*3/uL (ref 1.5–6.5)
Platelets: 211 10*3/uL (ref 145–400)
RBC: 4.79 10*6/uL (ref 3.70–5.45)
RDW: 13.2 % (ref 11.2–14.5)
WBC: 6.8 10*3/uL (ref 3.9–10.3)

## 2016-07-04 LAB — LACTATE DEHYDROGENASE: LDH: 190 U/L (ref 125–245)

## 2016-07-08 NOTE — Telephone Encounter (Signed)
Caitlin Free, Do I need to do anything on this?

## 2016-07-08 NOTE — Telephone Encounter (Signed)
Pt states we need to call [facility] to let them know we;ve started an appeal [phone]  Reference Invoice Number  [id number]

## 2016-07-09 ENCOUNTER — Other Ambulatory Visit: Payer: Self-pay | Admitting: *Deleted

## 2016-07-09 DIAGNOSIS — C50512 Malignant neoplasm of lower-outer quadrant of left female breast: Secondary | ICD-10-CM

## 2016-07-09 DIAGNOSIS — Z170 Estrogen receptor positive status [ER+]: Principal | ICD-10-CM

## 2016-07-09 DIAGNOSIS — I824Y9 Acute embolism and thrombosis of unspecified deep veins of unspecified proximal lower extremity: Secondary | ICD-10-CM

## 2016-07-11 ENCOUNTER — Ambulatory Visit (HOSPITAL_COMMUNITY)
Admission: RE | Admit: 2016-07-11 | Discharge: 2016-07-11 | Disposition: A | Discharge status: Home / Self Care | Payer: BLUE CROSS/BLUE SHIELD | Source: Ambulatory Visit | Attending: Oncology | Admitting: Oncology

## 2016-07-11 ENCOUNTER — Telehealth: Payer: Self-pay | Admitting: *Deleted

## 2016-07-11 ENCOUNTER — Ambulatory Visit (HOSPITAL_BASED_OUTPATIENT_CLINIC_OR_DEPARTMENT_OTHER): Payer: BLUE CROSS/BLUE SHIELD | Admitting: Oncology

## 2016-07-11 VITALS — BP 142/84 | HR 92 | Temp 98.2°F | Resp 18 | Ht 65.0 in | Wt 198.6 lb

## 2016-07-11 DIAGNOSIS — D0512 Intraductal carcinoma in situ of left breast: Secondary | ICD-10-CM

## 2016-07-11 DIAGNOSIS — C50512 Malignant neoplasm of lower-outer quadrant of left female breast: Secondary | ICD-10-CM

## 2016-07-11 DIAGNOSIS — Z170 Estrogen receptor positive status [ER+]: Secondary | ICD-10-CM | POA: Diagnosis not present

## 2016-07-11 DIAGNOSIS — I824Z2 Acute embolism and thrombosis of unspecified deep veins of left distal lower extremity: Secondary | ICD-10-CM

## 2016-07-11 DIAGNOSIS — M7989 Other specified soft tissue disorders: Secondary | ICD-10-CM | POA: Diagnosis not present

## 2016-07-11 DIAGNOSIS — I824Y9 Acute embolism and thrombosis of unspecified deep veins of unspecified proximal lower extremity: Secondary | ICD-10-CM | POA: Diagnosis not present

## 2016-07-11 DIAGNOSIS — I82491 Acute embolism and thrombosis of other specified deep vein of right lower extremity: Secondary | ICD-10-CM

## 2016-07-11 DIAGNOSIS — Z7901 Long term (current) use of anticoagulants: Secondary | ICD-10-CM | POA: Diagnosis not present

## 2016-07-11 DIAGNOSIS — M79609 Pain in unspecified limb: Secondary | ICD-10-CM | POA: Insufficient documentation

## 2016-07-11 DIAGNOSIS — Z79811 Long term (current) use of aromatase inhibitors: Secondary | ICD-10-CM | POA: Diagnosis not present

## 2016-07-11 NOTE — Progress Notes (Signed)
[place]  Telephone:(336) [phone] Fax:(336) [phone]     ID: Caitlin Wall DOB: 08/28/1955  MR#: [id number]  NKN#:[id number]  Patient Care Team: Biagio Borg, MD as PCP - General [name], Virgie Dad, MD as Consulting Physician (Oncology) Kyung Rudd, MD as Consulting Physician (Radiation Oncology) Jovita Kussmaul, MD as Consulting Physician (General Surgery) Sylvan Cheese, NP as Nurse Practitioner (Hematology and Oncology) PCP: Biagio Borg, MD SU: Star Age MD OTHER MD: Keturah Barre MD, Kyung Rudd MD  CHIEF COMPLAINT: Ductal carcinoma in situ  CURRENT TREATMENT: Anastrozole   BREAST CANCER HISTORY:  From the original intake note:   Caitlin Wall [facility]. She has subpectoral saline implants in place. There was a possible asymmetry in the left breast and she was recalled for left diagnostic mammography with tomosynthesis and left breast ultrasonography 11/03/2014. The breast density was category C. In the left breast lower outer quadrant there was an irregular mass which was not palpable and which by ultrasonography measured 0.6 cm. The left axilla was sonographically benign. Biopsy of this mass 11/09/2014 showed ([id number]) fibroadipose adipose tissue. This was felt to be discordant.  Accordingly the patient was referred to surgery and she underwent left breast radioactive seed localized lumpectomy 12/28/2014. The pathology from that procedure (([id number]) showed ductal carcinoma in situ, high-grade, measuring 0.2 cm. This was less than 0.1 cm from the posterior margin. The cells was estrogen receptor positive at 95%, with strong staining intensity, progesterone receptor positive at 30% with moderate staining intensity.  Her subsequent history is as detailed below.   INTERVAL HISTORY: Caitlin Wall returns today for follow-up of her ductal carcinoma in situ and more importantly for discussion and  evaluation of a recent DVT. To review: She presented to Dr. Jenny Reichmann on 04/16/2016 with a complaint of discomfort behind her left knee area he ordered a Doppler ultrasound of her left lower extremity and this showed acute deep vein thrombosis involving the gastrocnemius vein on the left. There was no superficial thrombus and nothing of concern in the right lower extremity. A CT angiogram obtained on the same day found no evidence of pulmonary embolus. She was started on rivaroxaban and a coagulopathy panel was obtained which was normal for anti-thrombin, protein C, protein S, and negative for lupus anticoagulant. Beta-2 glycoprotein studies were negative. Anti-cardiolipin IgA and IgG were low, the IgM was in the indeterminate range at 14. Factor V Leiden and prothrombin mutation studies are pending. She was referred back to [place] specifically to discuss length of anticoagulation in this setting.  From a breast cancer point of view, she continues on anastrozole, with good tolerance.Hot flashes and vaginal dryness are not a major issue. She never developed the arthralgias or myalgias that many patients can experience on this medication. She obtains it at a good price.  She understands that anastrozole is not a procoagulant-like tamoxifen and is not related to her clotting problems.  REVIEW OF SYSTEMS: She is working full-time in [employer]. 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 There has been no bleeding complications and the swelling in her left ankle has resolved. A detailed review of systems today was otherwise stable   PAST MEDICAL HISTORY: Past Medical History:  Diagnosis Date  . Asthma, persistent not controlled 07/27/2012  . Breast cancer ([place]) 12/28/14   Left BresastDCIS  . COLONIC POLYPS, HX OF  11/04/2007  . GERD 11/04/2007  . GERD (gastroesophageal reflux disease)   . HAIR LOSS 11/04/2007  . Hx of colonic polyps 1995    adenomatous  . Hyperlipidemia   . HYPERLIPIDEMIA 11/04/2007  . Hypertension   . HYPERTENSION 06/07/2009  . LBBB (left bundle branch block) 07/26/2011  . S/P radiation therapy 02/02/15-03/24/15   left breast 60.4GY    PAST SURGICAL HISTORY: Past Surgical History:  Procedure Laterality Date  . ABDOMINAL HYSTERECTOMY    . ANTERIOR FUSION CERVICAL SPINE    . bilat ear surgury/hearing loss    . BREAST LUMPECTOMY WITH RADIOACTIVE SEED LOCALIZATION Left 12/28/2014   Procedure: BREAST LUMPECTOMY WITH RADIOACTIVE SEED LOCALIZATION;  Surgeon: Autumn Messing III, MD;  Location: [facility];  Service: General;  Laterality: Left;    FAMILY HISTORY Family History  Problem Relation Age of Onset  . Breast cancer Other   . Cancer Paternal Grandmother        breast  . Lung cancer Father   . Breast cancer Maternal Grandmother   . Coronary artery disease Neg Hx   . Colon cancer Neg Hx   . Pancreatic cancer Neg Hx   . Rectal cancer Neg Hx   . Stomach cancer Neg Hx    the patient's father died from lung cancer in the setting of tobacco abuse at age 59. [name] died from "old age" at age 70. The patient had one brother, 3 sisters. The brother had" I cancer" requiring enucleation--the patient does not know whether this was a primary ocular melanoma. [name] was diagnosed with breast cancer but the patient does not know at what age. There is no other history of breast or ovarian cancer in the family  GYNECOLOGIC HISTORY:  No LMP recorded. Patient has had a hysterectomy. Menarche age 51, first live birth age 18. The patient is GX P3. She is status post hysterectomy without salpingo-oophorectomy. She did not take hormone replacement. She did use oral contraceptives remotely without any complications.  SOCIAL HISTORY:  Aizza works in accounts payable. Her husband Caitlin Wall is disabled because of back problems. Daughter Caitlin Wall is a Marine scientist working in rehabilitation in  [place]. Son Caitlin Wall lives in [place] where he works as a Games developer. Son Caitlin Wall lives in [place] where he works as a Emergency planning/management officer. The patient has 4 grandchildren. She is not a Ambulance person.    ADVANCED DIRECTIVES: Not in place   HEALTH MAINTENANCE: Social History  Substance Use Topics  . Smoking status: Former Smoker    Packs/day: 1.00    Years: 20.00    Types: Cigarettes    Quit date: 08/19/1989  . Smokeless tobacco: Never Used  . Alcohol use No     Colonoscopy:  PAP:  Bone density:  Lipid panel:  Allergies  Allergen Reactions  . Codeine     REACTION: nausea,    Current Outpatient Prescriptions  Medication Sig Dispense Refill  . anastrozole (ARIMIDEX) 1 MG tablet TAKE ONE TABLET BY MOUTH ONCE DAILY 90 tablet 4  . aspirin 81 MG tablet Take 81 mg by mouth daily.      . cholecalciferol (VITAMIN D) 1000 units tablet Take 1 tablet (1,000 Units total) by mouth daily. 90 tablet 4  . ibuprofen (ADVIL,MOTRIN) 200 MG tablet Take 200 mg by mouth as needed.    Marland Kitchen losartan (COZAAR) 50 MG tablet Take 1 tablet (50 mg total) by mouth daily. 90 tablet 3  . Omega-3 Fatty Acids (FISH OIL)  1000 MG CAPS Take 1,000 mg by mouth daily. Reported on 05/04/2015    . rivaroxaban (XARELTO) 20 MG TABS tablet Take 1 tablet (20 mg total) by mouth daily with supper. To start after take xarelto starter pack given at office visit 30 tablet 4   No current facility-administered medications for this visit.     OBJECTIVE: Middle-aged White woman In no acute distress Vitals:   07/11/16 1537  BP: (!) 142/84  Pulse: 92  Resp: 18  Temp: 98.2 F (36.8 C)     Body mass index is 33.05 kg/m.    ECOG FS:1 - Symptomatic but completely ambulatory  Sclerae unicteric, EOMs intact Oropharynx clear and moist No cervical or supraclavicular adenopathy Lungs no rales or rhonchi Heart regular rate and rhythm Abd soft, nontender, positive bowel sounds MSK no focal spinal tenderness, no upper  extremity lymphedema Neuro: nonfocal, well oriented, appropriate affect Breasts: The right breast is benign. The left breast is status post lumpectomy and radiation with no evidence of disease recurrence. There are bilateral nipple studies as before. Both axillae are benign.  LAB RESULTS:  CMP     Component Value Date/Time   NA 145 07/04/2016 1133   K 4.2 07/04/2016 1133   CL 106 10/11/2015 0931   CO2 28 07/04/2016 1133   GLUCOSE 101 07/04/2016 1133   BUN 16.0 07/04/2016 1133   CREATININE 0.9 07/04/2016 1133   CALCIUM 9.9 07/04/2016 1133   PROT 7.3 07/04/2016 1133   ALBUMIN 3.8 07/04/2016 1133   AST 19 07/04/2016 1133   ALT 17 07/04/2016 1133   ALKPHOS 172 (H) 07/04/2016 1133   BILITOT 0.94 07/04/2016 1133   GFRNONAA >60 12/23/2014 1330   GFRAA >60 12/23/2014 1330    INo results found for: SPEP, UPEP  Lab Results  Component Value Date   WBC 6.8 07/04/2016   NEUTROABS 4.2 07/04/2016   HGB 14.8 07/04/2016   HCT 43.4 07/04/2016   MCV 90.6 07/04/2016   PLT 211 07/04/2016      Chemistry      Component Value Date/Time   NA 145 07/04/2016 1133   K 4.2 07/04/2016 1133   CL 106 10/11/2015 0931   CO2 28 07/04/2016 1133   BUN 16.0 07/04/2016 1133   CREATININE 0.9 07/04/2016 1133      Component Value Date/Time   CALCIUM 9.9 07/04/2016 1133   ALKPHOS 172 (H) 07/04/2016 1133   AST 19 07/04/2016 1133   ALT 17 07/04/2016 1133   BILITOT 0.94 07/04/2016 1133       No results found for: LABCA2  No components found for: LABCA125  No results for input(s): INR in the last 168 hours.  Urinalysis    Component Value Date/Time   COLORURINE YELLOW 10/11/2015 [place] 10/11/2015 0931   LABSPEC >=1.030 06/03/2016 1805   PHURINE 5.5 06/03/2016 1805   GLUCOSEU NEGATIVE 06/03/2016 1805   GLUCOSEU NEGATIVE 10/11/2015 0931   HGBUR TRACE (A) 06/03/2016 1805   BILIRUBINUR NEGATIVE 06/03/2016 1805   KETONESUR NEGATIVE 06/03/2016 1805   PROTEINUR NEGATIVE  06/03/2016 1805   UROBILINOGEN 0.2 06/03/2016 1805   NITRITE NEGATIVE 06/03/2016 1805   LEUKOCYTESUR TRACE (A) 06/03/2016 1805    STUDIES: [facility] found the breast density to be category B. There was no evidence of malignancy.  Bilateral Doppler ultrasonography of both lower extremities today showed no evidence of residual clot.  ASSESSMENT: 61 y.o. [place] woman status post left lumpectomy 12/28/2014 for  ductal carcinoma in situ, high-grade measuring 0.2 cm, estrogen and progesterone receptor positive, with close but negative margins  (1) adjuvant radiation completed 03/24/2015  (2) anastrozole started 04/19/2015   (a) bone density 03/01/2016 shows a T score of -1.2  (3) mildly abnormal hepatic function panel: negative workup for hepatitis B and C, normal alpha-1-anti-trypsin  (4) left lower extremity DVT diagnosed by Doppler ultrasonography 04/16/2016, with negative CT angiogram chest  (a) on Xarelto starting 04/16/2016   (b) negative hypercoagulable panel, with a minimally abnormal (indeterminate) anti-cardiolipin IgM  ((c) stopped rivaroxaban 07/16/2016  PLAN From a breast cancer point of view [place] is doing fine and the plan continues to be for anastrozole to a total of 5 years.  As far as her right lower extremity DVT is concerned I think she may, off the rivaroxaban at this point. We are going to check a d-dimer today and repeat it in 3 months. Assuming all continues well she will see me again in February 2019, after her next mammogram, and we will follow her noninvasive breast cancer yearly from that point  She knows to call for any problems that may develop before the next visit including unilateral lower extremity swelling  :Chauncey Cruel, MD   07/13/2016 9:50 PM Medical Oncology and Hematology [facility] [place], [place] [address] Tel. [phone]    Fax. [phone]

## 2016-07-11 NOTE — Progress Notes (Signed)
**  Preliminary report by tech**  Bilateral lower extremity venous duplex completed. There is no evidence of deep or superficial vein thrombosis involving the right and left lower extremities. All visualized vessels appear patent and compressible. There is no evidence of Baker's cysts bilaterally. Results given to Rosilind at Dr. Virgie Dad office.  07/11/16 3:06 PM Caitlin Wall RVT

## 2016-07-11 NOTE — Telephone Encounter (Signed)
Informed MD per pt's scheduled appointment today for follow up.

## 2016-07-11 NOTE — Telephone Encounter (Signed)
This is Caitlin Wall with [facility].  This patient is S/P bilateral lower extremity venous doppler.  She is negative (-) for DVT to both legs."    Will notify provider.

## 2016-07-13 ENCOUNTER — Other Ambulatory Visit: Payer: Self-pay | Admitting: Internal Medicine

## 2016-07-15 NOTE — Telephone Encounter (Signed)
Tried to call patient to follow up.  VM was full.

## 2016-07-15 NOTE — Telephone Encounter (Signed)
I contacted Santiago Glad at [employer], she is putting account "on hold" for the next 30 days. This should keep her out of collections while we work on the appeal. Sent request to Dr. Jenny Reichmann for letter of medical necessity to see if that will help our patient. Will advise when the letter and OV notes sent to [facility], they will forward the appeal from there.

## 2016-07-19 NOTE — Telephone Encounter (Signed)
Notified patient.  Told her we would call back to update on next step.

## 2016-07-23 NOTE — Telephone Encounter (Signed)
Faxed OV notes to [facility] for patient appeal. Nothing else to be done until [employer] responds.

## 2016-08-05 ENCOUNTER — Encounter: Payer: Self-pay | Admitting: Internal Medicine

## 2016-08-08 NOTE — Telephone Encounter (Signed)
Faxed Letter of Medical Necessity from Dr. Jenny Reichmann to [facility] for appeal.

## 2016-10-10 ENCOUNTER — Other Ambulatory Visit (HOSPITAL_BASED_OUTPATIENT_CLINIC_OR_DEPARTMENT_OTHER): Payer: BLUE CROSS/BLUE SHIELD

## 2016-10-10 DIAGNOSIS — I824Z2 Acute embolism and thrombosis of unspecified deep veins of left distal lower extremity: Secondary | ICD-10-CM

## 2016-10-10 DIAGNOSIS — Z170 Estrogen receptor positive status [ER+]: Principal | ICD-10-CM

## 2016-10-10 DIAGNOSIS — C50512 Malignant neoplasm of lower-outer quadrant of left female breast: Secondary | ICD-10-CM

## 2016-10-11 ENCOUNTER — Encounter: Payer: BLUE CROSS/BLUE SHIELD | Admitting: Internal Medicine

## 2016-10-11 LAB — CARDIOLIPIN ANTIBODIES, IGG, IGM, IGA
ANTICARDIOLIPIN IGM: 14 [MPL'U]/mL — AB (ref 0–12)
Anticardiolipin Ab,IgA,Qn: <9 APL U/mL (ref 0–11)

## 2016-10-11 LAB — D-DIMER, QUANTITATIVE: D-DIMER: 0.49 mg/L FEU (ref 0.00–0.49)

## 2016-10-14 ENCOUNTER — Other Ambulatory Visit: Payer: Self-pay | Admitting: Oncology

## 2016-10-14 ENCOUNTER — Encounter: Payer: Self-pay | Admitting: Oncology

## 2016-10-14 NOTE — Progress Notes (Unsigned)
[place]  Telephone:(336) [phone] Fax:(336) [phone]     ID: Caitlin Wall DOB: [birth year]  MR#: [id number]  JYN#:[id number]  Patient Care Team: Caitlin Borg, MD as PCP - General Caitlin Wall, Caitlin Dad, MD as Consulting Physician (Oncology) Caitlin Rudd, MD as Consulting Physician (Radiation Oncology) Caitlin Kussmaul, MD as Consulting Physician (General Surgery) Caitlin Cheese, NP as Nurse Practitioner (Hematology and Oncology) PCP: Caitlin Borg, MD SU: Star Age MD OTHER MD: Caitlin Barre MD, Caitlin Rudd MD  CHIEF COMPLAINT: Ductal carcinoma in situ  CURRENT TREATMENT: Anastrozole   BREAST CANCER HISTORY:  From the original intake note:   Caitlin Wall [facility]. She has subpectoral saline implants in place. There was a possible asymmetry in the left breast and she was recalled for left diagnostic mammography with tomosynthesis and left breast ultrasonography 11/03/2014. The breast density was category C. In the left breast lower outer quadrant there was an irregular mass which was not palpable and which by ultrasonography measured 0.6 cm. The left axilla was sonographically benign. Biopsy of this mass 11/09/2014 showed ([id number]) fibroadipose adipose tissue. This was felt to be discordant.  Accordingly the patient was referred to surgery and she underwent left breast radioactive seed localized lumpectomy 12/28/2014. The pathology from that procedure (([id number]) showed ductal carcinoma in situ, high-grade, measuring 0.2 cm. This was less than 0.1 cm from the posterior margin. The cells was estrogen receptor positive at 95%, with strong staining intensity, progesterone receptor positive at 30% with moderate staining intensity.  Her subsequent history is as detailed below.   INTERVAL HISTORY: Caitlin Wall returns today for follow-up of her ductal carcinoma in situ and more importantly for discussion and  evaluation of a recent DVT. To review: She presented to Dr. Jenny Wall on 04/16/2016 with a complaint of discomfort behind her left knee area he ordered a Doppler ultrasound of her left lower extremity and this showed acute deep vein thrombosis involving the gastrocnemius vein on the left. There was no superficial thrombus and nothing of concern in the right lower extremity. A CT angiogram obtained on the same day found no evidence of pulmonary embolus. She was started on rivaroxaban and a coagulopathy panel was obtained which was normal for anti-thrombin, protein C, protein S, and negative for lupus anticoagulant. Beta-2 glycoprotein studies were negative. Anti-cardiolipin IgA and IgG were low, the IgM was in the indeterminate range at 14. Factor V Leiden and prothrombin mutation studies are pending. She was referred back to [place] specifically to discuss length of anticoagulation in this setting.  From a breast cancer point of view, she continues on anastrozole, with good tolerance.Hot flashes and vaginal dryness are not a major issue. She never developed the arthralgias or myalgias that many patients can experience on this medication. She obtains it at a good price.  She understands that anastrozole is not a procoagulant-like tamoxifen and is not related to her clotting problems.  REVIEW OF SYSTEMS: She is working full-time in [employer]. 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 There has been no bleeding complications and the swelling in her left ankle has resolved. A detailed review of systems today was otherwise stable   PAST MEDICAL HISTORY: Past Medical History:  Diagnosis Date  . Asthma, persistent not controlled 07/27/2012  . Breast cancer ([place]) 12/28/14   Left BresastDCIS  . COLONIC POLYPS, HX OF  11/04/2007  . GERD 11/04/2007  . GERD (gastroesophageal reflux disease)   . HAIR LOSS 11/04/2007  . Hx of colonic polyps 1995    adenomatous  . Hyperlipidemia   . HYPERLIPIDEMIA 11/04/2007  . Hypertension   . HYPERTENSION 06/07/2009  . LBBB (left bundle branch block) 07/26/2011  . S/P radiation therapy 02/02/15-03/24/15   left breast 60.4GY    PAST SURGICAL HISTORY: Past Surgical History:  Procedure Laterality Date  . ABDOMINAL HYSTERECTOMY    . ANTERIOR FUSION CERVICAL SPINE    . bilat ear surgury/hearing loss    . BREAST LUMPECTOMY WITH RADIOACTIVE SEED LOCALIZATION Left 12/28/2014   Procedure: BREAST LUMPECTOMY WITH RADIOACTIVE SEED LOCALIZATION;  Surgeon: Caitlin Messing III, MD;  Location: [facility];  Service: General;  Laterality: Left;    FAMILY HISTORY Family History  Problem Relation Age of Onset  . Breast cancer Other   . Cancer Paternal Grandmother        breast  . Lung cancer Father   . Breast cancer Maternal Grandmother   . Coronary artery disease Neg Hx   . Colon cancer Neg Hx   . Pancreatic cancer Neg Hx   . Rectal cancer Neg Hx   . Stomach cancer Neg Hx    the patient's father died from lung cancer in the setting of tobacco abuse at age 59. [name] died from "old age" at age 6. The patient had one brother, 3 sisters. The brother had" I cancer" requiring enucleation--the patient does not know whether this was a primary ocular melanoma. [name] was diagnosed with breast cancer but the patient does not know at what age. There is no other history of breast or ovarian cancer in the family  GYNECOLOGIC HISTORY:  No LMP recorded. Patient has had a hysterectomy. Menarche age 19, first live birth age 71. The patient is GX P3. She is status post hysterectomy without salpingo-oophorectomy. She did not take hormone replacement. She did use oral contraceptives remotely without any complications.  SOCIAL HISTORY:  Caitlin Wall works in accounts payable. Her husband Caitlin Wall is disabled because of back problems. Daughter Caitlin Wall is a Marine scientist working in rehabilitation in  [place]. Son Caitlin Wall lives in [place] where he works as a Games developer. Son Caitlin Wall lives in [place] where he works as a Emergency planning/management officer. The patient has 4 grandchildren. She is not a Ambulance person.    ADVANCED DIRECTIVES: Not in place   HEALTH MAINTENANCE: Social History  Substance Use Topics  . Smoking status: Former Smoker    Packs/day: 1.00    Years: 20.00    Types: Cigarettes    Quit date: 08/19/1989  . Smokeless tobacco: Never Used  . Alcohol use No     Colonoscopy:  PAP:  Bone density:  Lipid panel:  Allergies  Allergen Reactions  . Codeine     REACTION: nausea,    Current Outpatient Prescriptions  Medication Sig Dispense Refill  . anastrozole (ARIMIDEX) 1 MG tablet TAKE ONE TABLET BY MOUTH ONCE DAILY 90 tablet 4  . aspirin 81 MG tablet Take 81 mg by mouth daily.      . cholecalciferol (VITAMIN D) 1000 units tablet Take 1 tablet (1,000 Units total) by mouth daily. 90 tablet 4  . ibuprofen (ADVIL,MOTRIN) 200 MG tablet Take 200 mg by mouth as needed.    Marland Kitchen losartan (COZAAR) 50 MG tablet Take 1 tablet (50 mg total) by mouth daily. Annual appt due in august must see MD for future  refills 90 tablet 0  . Omega-3 Fatty Acids (FISH OIL) 1000 MG CAPS Take 1,000 mg by mouth daily. Reported on 05/04/2015    . rivaroxaban (XARELTO) 20 MG TABS tablet Take 1 tablet (20 mg total) by mouth daily with supper. To start after take xarelto starter pack given at office visit 30 tablet 4   No current facility-administered medications for this visit.     OBJECTIVE: Middle-aged White woman In no acute distress There were no vitals filed for this visit.   There is no height or weight on file to calculate BMI.    ECOG FS:1 - Symptomatic but completely ambulatory  Sclerae unicteric, EOMs intact Oropharynx clear and moist No cervical or supraclavicular adenopathy Lungs no rales or rhonchi Heart regular rate and rhythm Abd soft, nontender, positive bowel sounds MSK no focal  spinal tenderness, no upper extremity lymphedema Neuro: nonfocal, well oriented, appropriate affect Breasts: The right breast is benign. The left breast is status post lumpectomy and radiation with no evidence of disease recurrence. There are bilateral nipple studies as before. Both axillae are benign.  LAB RESULTS:  CMP     Component Value Date/Time   NA 145 07/04/2016 1133   K 4.2 07/04/2016 1133   CL 106 10/11/2015 0931   CO2 28 07/04/2016 1133   GLUCOSE 101 07/04/2016 1133   BUN 16.0 07/04/2016 1133   CREATININE 0.9 07/04/2016 1133   CALCIUM 9.9 07/04/2016 1133   PROT 7.3 07/04/2016 1133   ALBUMIN 3.8 07/04/2016 1133   AST 19 07/04/2016 1133   ALT 17 07/04/2016 1133   ALKPHOS 172 (H) 07/04/2016 1133   BILITOT 0.94 07/04/2016 1133   GFRNONAA >60 12/23/2014 1330   GFRAA >60 12/23/2014 1330    INo results found for: SPEP, UPEP  Lab Results  Component Value Date   WBC 6.8 07/04/2016   NEUTROABS 4.2 07/04/2016   HGB 14.8 07/04/2016   HCT 43.4 07/04/2016   MCV 90.6 07/04/2016   PLT 211 07/04/2016      Chemistry      Component Value Date/Time   NA 145 07/04/2016 1133   K 4.2 07/04/2016 1133   CL 106 10/11/2015 0931   CO2 28 07/04/2016 1133   BUN 16.0 07/04/2016 1133   CREATININE 0.9 07/04/2016 1133      Component Value Date/Time   CALCIUM 9.9 07/04/2016 1133   ALKPHOS 172 (H) 07/04/2016 1133   AST 19 07/04/2016 1133   ALT 17 07/04/2016 1133   BILITOT 0.94 07/04/2016 1133       No results found for: LABCA2  No components found for: LABCA125  No results for input(s): INR in the last 168 hours.  Urinalysis    Component Value Date/Time   COLORURINE YELLOW 10/11/2015 [place] 10/11/2015 0931   LABSPEC >=1.030 06/03/2016 1805   PHURINE 5.5 06/03/2016 1805   GLUCOSEU NEGATIVE 06/03/2016 1805   GLUCOSEU NEGATIVE 10/11/2015 0931   HGBUR TRACE (A) 06/03/2016 1805   BILIRUBINUR NEGATIVE 06/03/2016 1805   KETONESUR NEGATIVE 06/03/2016 1805    PROTEINUR NEGATIVE 06/03/2016 1805   UROBILINOGEN 0.2 06/03/2016 1805   NITRITE NEGATIVE 06/03/2016 1805   LEUKOCYTESUR TRACE (A) 06/03/2016 1805    STUDIES: [facility] found the breast density to be category B. There was no evidence of malignancy.  Bilateral Doppler ultrasonography of both lower extremities today showed no evidence of residual clot.  ASSESSMENT: 61 y.o. [place] woman status post left lumpectomy 12/28/2014  for ductal carcinoma in situ, high-grade measuring 0.2 cm, estrogen and progesterone receptor positive, with close but negative margins  (1) adjuvant radiation completed 03/24/2015  (2) anastrozole started 04/19/2015   (a) bone density 03/01/2016 shows a T score of -1.2  (3) mildly abnormal hepatic function panel: negative workup for hepatitis B and C, normal alpha-1-anti-trypsin  (4) left lower extremity DVT diagnosed by Doppler ultrasonography 04/16/2016, with negative CT angiogram chest  (a) on Xarelto starting 04/16/2016   (b) negative hypercoagulable panel, with a minimally abnormal (indeterminate) anti-cardiolipin IgM  ((c) stopped rivaroxaban 07/16/2016  PLAN From a breast cancer point of view Azelyn is doing fine and the plan continues to be for anastrozole to a total of 5 years.  As far as her right lower extremity DVT is concerned I think she may, off the rivaroxaban at this point. We are going to check a d-dimer today and repeat it in 3 months. Assuming all continues well she will see me again in February 2019, after her next mammogram, and we will follow her noninvasive breast cancer yearly from that point  She knows to call for any problems that may develop before the next visit including unilateral lower extremity swelling  :Chauncey Cruel, MD   10/14/2016 8:40 AM Medical Oncology and Hematology [facility] [place], [place] [address] Tel. [phone]    Fax. [phone]

## 2016-10-15 ENCOUNTER — Ambulatory Visit: Payer: BLUE CROSS/BLUE SHIELD | Admitting: Internal Medicine

## 2016-10-23 ENCOUNTER — Other Ambulatory Visit (INDEPENDENT_AMBULATORY_CARE_PROVIDER_SITE_OTHER): Payer: BLUE CROSS/BLUE SHIELD

## 2016-10-23 ENCOUNTER — Encounter: Payer: Self-pay | Admitting: Internal Medicine

## 2016-10-23 ENCOUNTER — Ambulatory Visit (INDEPENDENT_AMBULATORY_CARE_PROVIDER_SITE_OTHER): Payer: BLUE CROSS/BLUE SHIELD | Admitting: Internal Medicine

## 2016-10-23 VITALS — BP 146/98 | HR 70 | Temp 98.7°F | Ht 65.0 in | Wt 195.0 lb

## 2016-10-23 DIAGNOSIS — Z0000 Encounter for general adult medical examination without abnormal findings: Secondary | ICD-10-CM

## 2016-10-23 DIAGNOSIS — Z114 Encounter for screening for human immunodeficiency virus [HIV]: Secondary | ICD-10-CM

## 2016-10-23 DIAGNOSIS — Z0001 Encounter for general adult medical examination with abnormal findings: Secondary | ICD-10-CM

## 2016-10-23 DIAGNOSIS — Z23 Encounter for immunization: Secondary | ICD-10-CM | POA: Diagnosis not present

## 2016-10-23 DIAGNOSIS — R002 Palpitations: Secondary | ICD-10-CM | POA: Diagnosis not present

## 2016-10-23 DIAGNOSIS — R079 Chest pain, unspecified: Secondary | ICD-10-CM | POA: Insufficient documentation

## 2016-10-23 DIAGNOSIS — I10 Essential (primary) hypertension: Secondary | ICD-10-CM | POA: Diagnosis not present

## 2016-10-23 LAB — TSH: TSH: 1.96 u[IU]/mL (ref 0.35–4.50)

## 2016-10-23 LAB — CBC WITH DIFFERENTIAL/PLATELET
BASOS ABS: 0 10*3/uL (ref 0.0–0.1)
BASOS PCT: 0.5 % (ref 0.0–3.0)
Eosinophils Absolute: 0.1 10*3/uL (ref 0.0–0.7)
Eosinophils Relative: 2.1 % (ref 0.0–5.0)
HEMATOCRIT: 42.2 % (ref 36.0–46.0)
Hemoglobin: 14.4 g/dL (ref 12.0–15.0)
LYMPHS ABS: 2.1 10*3/uL (ref 0.7–4.0)
LYMPHS PCT: 29.4 % (ref 12.0–46.0)
MCHC: 34.1 g/dL (ref 30.0–36.0)
MCV: 91.1 fl (ref 78.0–100.0)
MONOS PCT: 10.7 % (ref 3.0–12.0)
Monocytes Absolute: 0.8 10*3/uL (ref 0.1–1.0)
NEUTROS ABS: 4.1 10*3/uL (ref 1.4–7.7)
NEUTROS PCT: 57.3 % (ref 43.0–77.0)
PLATELETS: 202 10*3/uL (ref 150.0–400.0)
RBC: 4.63 Mil/uL (ref 3.87–5.11)
RDW: 13.2 % (ref 11.5–15.5)
WBC: 7.1 10*3/uL (ref 4.0–10.5)

## 2016-10-23 LAB — BASIC METABOLIC PANEL
BUN: 13 mg/dL (ref 6–23)
CALCIUM: 9.6 mg/dL (ref 8.4–10.5)
CO2: 28 meq/L (ref 19–32)
CREATININE: 0.75 mg/dL (ref 0.40–1.20)
Chloride: 107 mEq/L (ref 96–112)
GFR: 83.88 mL/min (ref >60.00)
Glucose, Bld: 95 mg/dL (ref 70–99)
Potassium: 3.5 mEq/L (ref 3.5–5.1)
SODIUM: 143 meq/L (ref 135–145)

## 2016-10-23 LAB — URINALYSIS, ROUTINE W REFLEX MICROSCOPIC
Bilirubin Urine: NEGATIVE
Hgb urine dipstick: NEGATIVE
Ketones, ur: NEGATIVE
LEUKOCYTES UA: NEGATIVE
Nitrite: NEGATIVE
PH: 6 (ref 5.0–8.0)
RBC / HPF: NONE SEEN (ref 0–?)
SPECIFIC GRAVITY, URINE: 1.015 (ref 1.000–1.030)
TOTAL PROTEIN, URINE-UPE24: NEGATIVE
UROBILINOGEN UA: 1 (ref 0.0–1.0)
Urine Glucose: NEGATIVE

## 2016-10-23 LAB — HEPATIC FUNCTION PANEL
ALK PHOS: 116 U/L (ref 39–117)
ALT: 15 U/L (ref 0–35)
AST: 20 U/L (ref 0–37)
Albumin: 3.9 g/dL (ref 3.5–5.2)
BILIRUBIN DIRECT: 0.2 mg/dL (ref 0.0–0.3)
BILIRUBIN TOTAL: 0.7 mg/dL (ref 0.2–1.2)
TOTAL PROTEIN: 6.8 g/dL (ref 6.0–8.3)

## 2016-10-23 LAB — LIPID PANEL
CHOL/HDL RATIO: 4
Cholesterol: 161 mg/dL (ref 0–200)
HDL: 37.6 mg/dL — AB (ref >39.00)
LDL CALC: 101 mg/dL — AB (ref 0–99)
NONHDL: 123.32
Triglycerides: 110 mg/dL (ref 0.0–149.0)
VLDL: 22 mg/dL (ref 0.0–40.0)

## 2016-10-23 MED ORDER — LOSARTAN POTASSIUM 100 MG PO TABS: 100.0000 mg | ORAL_TABLET | Freq: Every day | ORAL | 3 refills | Status: DC

## 2016-10-23 NOTE — Assessment & Plan Note (Addendum)
Atypical, ecg with LBBB o/w unable to assess for ischemia; will need stress test In addition to the time spent performing CPE, I spent an additional 15 minutes face to face,in which greater than 50% of this time was spent in counseling and coordination of care for patient's illness as documented, including the differential dx, tx, further eval and other management of chest pain, heart murmur and palpitaitons

## 2016-10-23 NOTE — Assessment & Plan Note (Signed)
Mild uncontrolled, o/w stable overall by history and exam, recent data reviewed with pt, and pt to increase the cozaar to 100 qd,  to f/u any worsening symptoms or concerns BP Readings from Last 3 Encounters:  10/23/16 (!) 146/98  07/11/16 (!) 142/84  06/03/16 (!) 154/74

## 2016-10-23 NOTE — Assessment & Plan Note (Signed)

## 2016-10-23 NOTE — Progress Notes (Addendum)
Subjective:    Patient ID: Caitlin Wall, female    DOB: 01/02/1956, 61 y.o.   MRN: [id number]  HPI Here for wellness and f/u;  Overall doing ok;  Pt denies worsening SOB, DOE, wheezing, orthopnea, PND, worsening LE edema, dizziness or syncope, but has been having some "extra beat" palpitations in the last several wks  Also with heart murmur per  Oncology.  No prior Echo..  Pt denies neurological change such as new headache, facial or extremity weakness.  Pt denies polydipsia, polyuria, or low sugar symptoms. Pt states overall good compliance with treatment and medications, good tolerability, and has been trying to follow appropriate diet.  Pt denies worsening depressive symptoms, suicidal ideation or panic. No fever, night sweats, wt loss, loss of appetite, or other constitutional symptoms.  Pt states good ability with ADL's, has low fall risk, home safety reviewed and adequate, no other significant changes in hearing or vision, and has been somewhat lessactive with exercise in the last year On further questioning pt has had recurring SSCP dull without radiation or assoc symtpoms for 2-3 wks Past Medical History:  Diagnosis Date  . Asthma, persistent not controlled 07/27/2012  . Breast cancer ([place]) 12/28/14   Left BresastDCIS  . COLONIC POLYPS, HX OF 11/04/2007  . GERD 11/04/2007  . GERD (gastroesophageal reflux disease)   . HAIR LOSS 11/04/2007  . Hx of colonic polyps 1995   adenomatous  . Hyperlipidemia   . HYPERLIPIDEMIA 11/04/2007  . Hypertension   . HYPERTENSION 06/07/2009  . LBBB (left bundle branch block) 07/26/2011  . S/P radiation therapy 02/02/15-03/24/15   left breast 60.4GY   Past Surgical History:  Procedure Laterality Date  . ABDOMINAL HYSTERECTOMY    . ANTERIOR FUSION CERVICAL SPINE    . bilat ear surgury/hearing loss    . BREAST LUMPECTOMY WITH RADIOACTIVE SEED LOCALIZATION Left 12/28/2014   Procedure: BREAST LUMPECTOMY WITH RADIOACTIVE SEED LOCALIZATION;  Surgeon: Autumn Messing  III, MD;  Location: [facility];  Service: General;  Laterality: Left;    reports that she quit smoking about 27 years ago. Her smoking use included Cigarettes. She has a 20.00 pack-year smoking history. She has never used smokeless tobacco. She reports that she does not drink alcohol or use drugs. family history includes Breast cancer in her maternal grandmother and other; Cancer in her paternal grandmother; Lung cancer in her father. Allergies  Allergen Reactions  . Codeine     REACTION: nausea,   Current Outpatient Prescriptions on File Prior to Visit  Medication Sig Dispense Refill  . anastrozole (ARIMIDEX) 1 MG tablet TAKE ONE TABLET BY MOUTH ONCE DAILY 90 tablet 4  . aspirin 81 MG tablet Take 81 mg by mouth daily.      . cholecalciferol (VITAMIN D) 1000 units tablet Take 1 tablet (1,000 Units total) by mouth daily. 90 tablet 4  . ibuprofen (ADVIL,MOTRIN) 200 MG tablet Take 200 mg by mouth as needed.    Marland Kitchen losartan (COZAAR) 50 MG tablet Take 1 tablet (50 mg total) by mouth daily. Annual appt due in august must see MD for future refills 90 tablet 0  . Omega-3 Fatty Acids (FISH OIL) 1000 MG CAPS Take 1,000 mg by mouth daily. Reported on 05/04/2015    . rivaroxaban (XARELTO) 20 MG TABS tablet Take 1 tablet (20 mg total) by mouth daily with supper. To start after take xarelto starter pack given at office visit 30 tablet 4   No current facility-administered medications on file  prior to visit.    Review of Systems Constitutional: Negative for other unusual diaphoresis, sweats, appetite or weight changes HENT: Negative for other worsening hearing loss, ear pain, facial swelling, mouth sores or neck stiffness.   Eyes: Negative for other worsening pain, redness or other visual disturbance.  Respiratory: Negative for other stridor or swelling Cardiovascular: Negative for other palpitations or other chest pain  Gastrointestinal: Negative for worsening diarrhea or loose stools, blood  in stool, distention or other pain Genitourinary: Negative for hematuria, flank pain or other change in urine volume.  Musculoskeletal: Negative for myalgias or other joint swelling.  Skin: Negative for other color change, or other wound or worsening drainage.  Neurological: Negative for other syncope or numbness. Hematological: Negative for other adenopathy or swelling Psychiatric/Behavioral: Negative for hallucinations, other worsening agitation, SI, self-injury, or new decreased concentration All other system neg per pt    Objective:   Physical Exam BP (!) 146/98   Pulse 70   Temp 98.7 F (37.1 C) (Oral)   Ht 5\' 5"  (1.651 m)   Wt 195 lb (88.5 kg)   SpO2 98%   BMI 32.45 kg/m  VS noted,  Constitutional: Pt is oriented to person, place, and time. Appears well-developed and well-nourished, in no significant distress and comfortable Head: Normocephalic and atraumatic  Eyes: Conjunctivae and EOM are normal. Pupils are equal, round, and reactive to light Right Ear: External ear normal without discharge Left Ear: External ear normal without discharge Nose: Nose without discharge or deformity Mouth/Throat: Oropharynx is without other ulcerations and moist  Neck: Normal range of motion. Neck supple. No JVD present. No tracheal deviation present or significant neck LA or mass Cardiovascular: Normal rate, regular rhythm, normal heart sounds and intact distal pulses.  , I am unable to appreciate murmur Pulmonary/Chest: WOB normal and breath sounds without rales or wheezing  Abdominal: Soft. Bowel sounds are normal. NT. No HSM  Musculoskeletal: Normal range of motion. Exhibits no edema Lymphadenopathy: Has no other cervical adenopathy.  Neurological: Pt is alert and oriented to person, place, and time. Pt has normal reflexes. No cranial nerve deficit. Motor grossly intact, Gait intact Skin: Skin is warm and dry. No rash noted or new ulcerations Psychiatric:  Has normal mood and affect.  Behavior is normal without agitation No other exam findings Lab Results  Component Value Date   WBC 7.1 10/23/2016   HGB 14.4 10/23/2016   HCT 42.2 10/23/2016   PLT 202.0 10/23/2016   GLUCOSE 95 10/23/2016   CHOL 161 10/23/2016   TRIG 110.0 10/23/2016   HDL 37.60 (L) 10/23/2016   LDLCALC 101 (H) 10/23/2016   ALT 15 10/23/2016   AST 20 10/23/2016   NA 143 10/23/2016   K 3.5 10/23/2016   CL 107 10/23/2016   CREATININE 0.75 10/23/2016   BUN 13 10/23/2016   CO2 28 10/23/2016   TSH 1.96 10/23/2016   EcG today I have personally Interpred  SR with LBBB     Assessment & Plan:

## 2016-10-23 NOTE — Patient Instructions (Addendum)
You had the flu shot today  Your EKG was consistent with an electrical problem on the left side of the heart  You will be contacted regarding the referral for: Echocardiogram, and Stress test  Please increase the losartan to 100 mg per day for better blood pressure  Please continue all other medications as before, and refills have been done if requested.  Please have the pharmacy call with any other refills you may need.  Please continue your efforts at being more active, low cholesterol diet, and weight control.  You are otherwise up to date with prevention measures today.  Please keep your appointments with your specialists as you may have planned  Please return in 1 year for your yearly visit, or sooner if needed, with Lab testing done 3-5 days before

## 2016-10-23 NOTE — Addendum Note (Signed)
Addended by: Biagio Borg on: 10/23/2016 02:14 PM   Modules accepted: Orders

## 2016-10-23 NOTE — Assessment & Plan Note (Addendum)
?   Ectopy vs other, also ? Heart murmur, ecg reviewed, TSH normal, o/w stable, pt is OK for echo

## 2016-10-29 ENCOUNTER — Telehealth (HOSPITAL_COMMUNITY): Payer: Self-pay | Admitting: *Deleted

## 2016-10-29 NOTE — Telephone Encounter (Signed)
Patient given detailed instructions per Myocardial Perfusion Study Information Sheet for the test on 11/01/16 at [facility]. Patient notified to arrive 15 minutes early and that it is imperative to arrive on time for appointment to keep from having the test rescheduled.  If you need to cancel or reschedule your appointment, please call the office within 24 hours of your appointment. . Patient verbalized understanding.Caitlin Wall, Ranae Palms

## 2016-11-01 ENCOUNTER — Ambulatory Visit (HOSPITAL_COMMUNITY): Payer: BLUE CROSS/BLUE SHIELD | Attending: Cardiology

## 2016-11-01 ENCOUNTER — Encounter: Payer: Self-pay | Admitting: Internal Medicine

## 2016-11-01 ENCOUNTER — Ambulatory Visit (HOSPITAL_BASED_OUTPATIENT_CLINIC_OR_DEPARTMENT_OTHER): Payer: BLUE CROSS/BLUE SHIELD

## 2016-11-01 ENCOUNTER — Other Ambulatory Visit: Payer: Self-pay

## 2016-11-01 DIAGNOSIS — R9431 Abnormal electrocardiogram [ECG] [EKG]: Secondary | ICD-10-CM | POA: Diagnosis not present

## 2016-11-01 DIAGNOSIS — I119 Hypertensive heart disease without heart failure: Secondary | ICD-10-CM | POA: Insufficient documentation

## 2016-11-01 DIAGNOSIS — R0609 Other forms of dyspnea: Secondary | ICD-10-CM | POA: Insufficient documentation

## 2016-11-01 DIAGNOSIS — E119 Type 2 diabetes mellitus without complications: Secondary | ICD-10-CM | POA: Insufficient documentation

## 2016-11-01 DIAGNOSIS — R011 Cardiac murmur, unspecified: Secondary | ICD-10-CM | POA: Diagnosis not present

## 2016-11-01 DIAGNOSIS — R0602 Shortness of breath: Secondary | ICD-10-CM | POA: Insufficient documentation

## 2016-11-01 DIAGNOSIS — R002 Palpitations: Secondary | ICD-10-CM | POA: Insufficient documentation

## 2016-11-01 DIAGNOSIS — R079 Chest pain, unspecified: Secondary | ICD-10-CM | POA: Diagnosis present

## 2016-11-01 DIAGNOSIS — I2510 Atherosclerotic heart disease of native coronary artery without angina pectoris: Secondary | ICD-10-CM | POA: Diagnosis not present

## 2016-11-01 LAB — MYOCARDIAL PERFUSION IMAGING
CHL CUP NUCLEAR SRS: 17
CHL CUP NUCLEAR SSS: 20
CHL CUP RESTING HR STRESS: 64 {beats}/min
LV dias vol: 95 mL (ref 46–106)
LV sys vol: 44 mL
Peak HR: 106 {beats}/min
RATE: 0.41
SDS: 3
TID: 1.03

## 2016-11-01 MED ORDER — PERFLUTREN LIPID MICROSPHERE
1.0000 mL | INTRAVENOUS | Status: AC | PRN
  Administered 2016-11-01: 2 mL via INTRAVENOUS

## 2016-11-01 MED ORDER — TECHNETIUM TC 99M TETROFOSMIN IV KIT
32.2000 | PACK | Freq: Once | INTRAVENOUS | Status: AC | PRN
  Administered 2016-11-01: 32.2 via INTRAVENOUS
  Filled 2016-11-01: qty 33

## 2016-11-01 MED ORDER — TECHNETIUM TC 99M TETROFOSMIN IV KIT
10.4000 | PACK | Freq: Once | INTRAVENOUS | Status: AC | PRN
  Administered 2016-11-01: 10.4 via INTRAVENOUS
  Filled 2016-11-01: qty 11

## 2016-11-01 MED ORDER — REGADENOSON 0.4 MG/5ML IV SOLN
0.4000 mg | Freq: Once | INTRAVENOUS | Status: AC
  Administered 2016-11-01: 0.4 mg via INTRAVENOUS

## 2016-11-02 ENCOUNTER — Other Ambulatory Visit: Payer: Self-pay | Admitting: Internal Medicine

## 2016-11-02 ENCOUNTER — Encounter: Payer: Self-pay | Admitting: Internal Medicine

## 2016-11-02 DIAGNOSIS — I429 Cardiomyopathy, unspecified: Secondary | ICD-10-CM

## 2016-11-04 ENCOUNTER — Telehealth: Payer: Self-pay

## 2016-11-04 NOTE — Telephone Encounter (Signed)
-----   Message from Biagio Borg, MD sent at 11/02/2016  5:20 PM EDT ----- Letter sent, cont same tx except  The test results show that your current treatment is OK, except the heart function is mildly reduced compared to your last Echo in 2013.  Since this is mildly worse, we should refer you to Cardiology for further consideration.  You should hear from the office about this as well.    Caitlin Wall to please inform pt, I will do referral

## 2016-11-04 NOTE — Telephone Encounter (Signed)
Called pt, VM is not set up to leave a msg.

## 2016-11-11 NOTE — Telephone Encounter (Signed)
Patient called back.  Gave MD response.  °

## 2016-11-12 ENCOUNTER — Encounter: Payer: Self-pay | Admitting: Interventional Cardiology

## 2016-11-12 ENCOUNTER — Ambulatory Visit (INDEPENDENT_AMBULATORY_CARE_PROVIDER_SITE_OTHER): Payer: BLUE CROSS/BLUE SHIELD | Admitting: Interventional Cardiology

## 2016-11-12 VITALS — BP 126/90 | HR 56 | Ht 65.0 in | Wt 197.6 lb

## 2016-11-12 DIAGNOSIS — R0789 Other chest pain: Secondary | ICD-10-CM

## 2016-11-12 DIAGNOSIS — C50912 Malignant neoplasm of unspecified site of left female breast: Secondary | ICD-10-CM

## 2016-11-12 DIAGNOSIS — I5042 Chronic combined systolic (congestive) and diastolic (congestive) heart failure: Secondary | ICD-10-CM | POA: Diagnosis not present

## 2016-11-12 DIAGNOSIS — I447 Left bundle-branch block, unspecified: Secondary | ICD-10-CM

## 2016-11-12 DIAGNOSIS — R9439 Abnormal result of other cardiovascular function study: Secondary | ICD-10-CM

## 2016-11-12 NOTE — Progress Notes (Signed)
Cardiology Office Note    Date:  11/12/2016   ID:  Caitlin Wall [place], DOB [birth year], MRN [id number]  PCP:  Biagio Borg, MD  Cardiologist: Sinclair Grooms, MD   Chief Complaint  Patient presents with  . Advice Only    Abnormal stress test  . Chest Pain    History of Present Illness:  Caitlin Wall is a 61 y.o. female with long-standing history of left bundle branch block (at least 2013), essential hypertension, prior smoker, hyperlipidemia, and recent chest discomfort. She is referred by Dr. [name] after cardiac perfusion imaging returned abnormal.  The patient has known left bundle branch block. For the past 2-3 months she has noted a sensation of pressure in her chest. This is nearly continuous. The discomfort started spontaneously. It is not aggravated by physical activity. She occasionally has lightheaded and dizzy feeling. She also feels as though her heart skips a beat or has irregular rhythm. Because of this an echocardiogram and pharmacologic myocardial perfusion study wanted. Echocardiogram demonstrated normal reduced left ventricular systolic function with an estimated EF in the 40-45% range. Diffuse hypokinesis was felt to be present. All the same day, a pharmacologic myocardial perfusion study was performed and demonstrated normal EF with moderate anterior anteroapical and lateral apical perfusion abnormality. She is referred because of these findings. Currently the patient states that there is mild discomfort in her chest which is been present now for weeks. Physical activity slightly worsens discomfort. She denies orthopnea.  Past Medical History:  Diagnosis Date  . Asthma, persistent not controlled 07/27/2012  . Breast cancer ([place]) 12/28/14   Left BresastDCIS  . COLONIC POLYPS, HX OF 11/04/2007  . GERD 11/04/2007  . GERD (gastroesophageal reflux disease)   . HAIR LOSS 11/04/2007  . Hx of colonic polyps 1995   adenomatous  . Hyperlipidemia   . HYPERLIPIDEMIA  11/04/2007  . Hypertension   . HYPERTENSION 06/07/2009  . LBBB (left bundle branch block) 07/26/2011  . S/P radiation therapy 02/02/15-03/24/15   left breast 60.4GY    Past Surgical History:  Procedure Laterality Date  . ABDOMINAL HYSTERECTOMY    . ANTERIOR FUSION CERVICAL SPINE    . bilat ear surgury/hearing loss    . BREAST LUMPECTOMY WITH RADIOACTIVE SEED LOCALIZATION Left 12/28/2014   Procedure: BREAST LUMPECTOMY WITH RADIOACTIVE SEED LOCALIZATION;  Surgeon: Autumn Messing III, MD;  Location: [facility];  Service: General;  Laterality: Left;    Current Medications: Outpatient Medications Prior to Visit  Medication Sig Dispense Refill  . anastrozole (ARIMIDEX) 1 MG tablet TAKE ONE TABLET BY MOUTH ONCE DAILY 90 tablet 4  . aspirin 81 MG tablet Take 81 mg by mouth daily.      . cholecalciferol (VITAMIN D) 1000 units tablet Take 1 tablet (1,000 Units total) by mouth daily. 90 tablet 4  . ibuprofen (ADVIL,MOTRIN) 200 MG tablet Take 200 mg by mouth as needed.    Marland Kitchen losartan (COZAAR) 100 MG tablet Take 1 tablet (100 mg total) by mouth daily. 90 tablet 3  . Omega-3 Fatty Acids (FISH OIL) 1000 MG CAPS Take 1,000 mg by mouth daily. Reported on 05/04/2015    . rivaroxaban (XARELTO) 20 MG TABS tablet Take 1 tablet (20 mg total) by mouth daily with supper. To start after take xarelto starter pack given at office visit (Patient not taking: Reported on 11/12/2016) 30 tablet 4   No facility-administered medications prior to visit.      Allergies:   Codeine  Social History   Social History  . Marital status: Married    Spouse name: N/A  . Number of children: 3  . Years of education: N/A   Occupational History  . accounts receivable [employer]   Social History Main Topics  . Smoking status: Former Smoker    Packs/day: 1.00    Years: 20.00    Types: Cigarettes    Quit date: 08/19/1989  . Smokeless tobacco: Never Used  . Alcohol use No  . Drug use: No  . Sexual activity:  Not Asked   Other Topics Concern  . None   Social History Narrative  . None     Family History:  The patient's family history includes Breast cancer in her maternal grandmother and other; Cancer in her paternal grandmother; Healthy in her mother; Lung cancer in her father.   ROS:   Please see the history of present illness.    Prior smoker. Ductal carcinoma of the left breast was diagnosed 2 years ago. Did not require chemotherapy. Received lumpectomy and radiation. This was followed by breast implant. Notes easy bruising and constipation.  All other systems reviewed and are negative.   PHYSICAL EXAM:   VS:  BP 126/90 (BP Location: Left Arm)   Pulse (!) 56   Ht 5\' 5"  (1.651 m)   Wt 197 lb 9.6 oz (89.6 kg)   BMI 32.88 kg/m    GEN: Well nourished, well developed, in no acute distress  HEENT: normal  Neck: no JVD, carotid bruits, or masses Cardiac:An S4 gallop is noted. P2 is increased in intensity. RRR; no murmurs, rubs, no edema . Respiratory:  clear to auscultation bilaterally, normal work of breathing GI: soft, nontender, nondistended, + BS MS: no deformity or atrophy  Skin: warm and dry, no rash Neuro:  Alert and Oriented x 3, Strength and sensation are intact Psych: euthymic mood, full affect  Wt Readings from Last 3 Encounters:  11/12/16 197 lb 9.6 oz (89.6 kg)  10/23/16 195 lb (88.5 kg)  07/11/16 198 lb 9.6 oz (90.1 kg)      Studies/Labs Reviewed:   EKG:  EKG  EKG performed on 10/23/2016 reveals left bundle-branch block with QRS duration 152 ms.  Recent Labs: 10/23/2016: ALT 15; BUN 13; Creatinine, Ser 0.75; Hemoglobin 14.4; Platelets 202.0; Potassium 3.5; Sodium 143; TSH 1.96   Lipid Panel    Component Value Date/Time   CHOL 161 10/23/2016 0758   TRIG 110.0 10/23/2016 0758   HDL 37.60 (L) 10/23/2016 0758   CHOLHDL 4 10/23/2016 0758   VLDL 22.0 10/23/2016 0758   LDLCALC 101 (H) 10/23/2016 0758    Additional studies/ records that were reviewed today  include:  Echocardiography 11/01/2016:  ------------------------------------------------------------------- Study Conclusions   - Left ventricle: The cavity size was mildly dilated. Systolic   function was mildly to moderately reduced. The estimated ejection   fraction was in the range of 40% to 45%. Mild diffuse   hypokinesis. There was an increased relative contribution of   atrial contraction to ventricular filling. Doppler parameters are   consistent with abnormal left ventricular relaxation (grade 1   diastolic dysfunction).  Myocardial perfusion imaging 11/01/2016: Study Highlights   Nuclear stress EF: 54%. The left ventricular ejection fraction is normal (55-65%).  Defect 1: There is a medium defect of moderate severity present in the mid anteroseptal, apical anterior, apical septal, apical lateral and apex location. This defect is most likely due to breast attenuation artifact.  This is a low risk  study.     ASSESSMENT:    1. Chest discomfort   2. Chronic combined systolic and diastolic heart failure ([place])   3. Left bundle branch block   4. Ductal carcinoma of left breast ([place])   5. Abnormal nuclear stress test      PLAN:  In order of problems listed above:  1. She has typical and atypical features. The quality of the discomfort is concerning. The continuous nature is not typical of ischemic heart disease. Under the circumstances where the nuclear study suggests a perfusion abnormality and the echo suggests decreased LV function, coronary angiography seems most logical next step to identify anatomy and help guide diagnosis and management. The procedure and risks were discussed in detail as outlined below. 2. The echocardiogram is usually more accurate for EF assessment and therefore she appears to have new reduction in LV function. We need to exclude the possibility of CAD as a contributed to decrease function. If we verified poor LV function by ventriculography at cath, we  will need to further escalate heart failure therapy to include beta blocker and possibly Aldactone. It is possible that much of the sensation of fullness in the chest is related to volume overload. 3. Chronic 4. Not addressed 5. As noted above in this context I believe coronary angiography is indicated  The patient was counseled to undergo left heart catheterization, coronary angiography, and possible percutaneous coronary intervention with stent implantation. The procedural risks and benefits were discussed in detail. The risks discussed included death, stroke, myocardial infarction, life-threatening bleeding, limb ischemia, kidney injury, allergy, and possible emergency cardiac surgery. The risk of these significant complications were estimated to occur less than 1% of the time. After discussion, the patient has agreed to proceed.  Medication Adjustments/Labs and Tests Ordered: Current medicines are reviewed at length with the patient today.  Concerns regarding medicines are outlined above.  Medication changes, Labs and Tests ordered today are listed in the Patient Instructions below. Patient Instructions  Medication Instructions:  None  Labwork: CBC, INR and BMET today  Testing/Procedures: Your physician has requested that you have a cardiac catheterization. Cardiac catheterization is used to diagnose and/or treat various heart conditions. Doctors may recommend this procedure for a number of different reasons. The most common reason is to evaluate chest pain. Chest pain can be a symptom of coronary artery disease (CAD), and cardiac catheterization can show whether plaque is narrowing or blocking your heart's arteries. This procedure is also used to evaluate the valves, as well as measure the blood flow and oxygen levels in different parts of your heart. For further information please visit HugeFiesta[facility]tn. Please follow instruction sheet, as given.   Follow-Up: Your physician recommends  that you schedule a follow-up appointment in: 2-3 weeks after catheterization with a PA or NP.    Any Other Special Instructions Will Be Listed Below (If Applicable).    [place] OFFICE [address], [place] [address] [place] [address] Dept: [phone] Loc: [place] [facility]  11/12/2016  You are scheduled for a Cardiac Catheterization on Friday, September 21 with Dr. Daneen Schick.  1. Please arrive at the [facility] (Main Entrance A) at [facility]: [address] [place], [place] [address] at 11:30 AM (two hours before your procedure to ensure your preparation). Free valet parking service is available.   Special note: Every effort is made to have your procedure done on time. Please understand  that emergencies sometimes delay scheduled procedures.  2. Diet: Do not eat or drink anything after midnight prior to your procedure except sips of water to take medications.  3. Labs: You will have labs drawn today  4. Medication instructions in preparation for your procedure:  You may take all of your medications with a sip of water.  On the morning of your procedure, take your Aspirin and any morning medicines NOT listed above.  You may use sips of water.  5. Plan for one night stay--bring personal belongings. 6. Bring a current list of your medications and current insurance cards. 7. You MUST have a responsible person to drive you home. 8. Someone MUST be with you the first 24 hours after you arrive home or your discharge will be delayed. 9. Please wear clothes that are easy to get on and off and wear slip-on shoes.  Thank you for allowing [place] to care for you!   -- [place] [facility]    If you need a refill on your cardiac medications before your next appointment, please call your pharmacy.      Signed, Sinclair Grooms, MD  11/12/2016 6:04 PM      [address] [place], [place], [place]  [address] Phone: [phone]; Fax: [phone]

## 2016-11-12 NOTE — Patient Instructions (Addendum)
Medication Instructions:  None  Labwork: CBC, INR and BMET today  Testing/Procedures: Your physician has requested that you have a cardiac catheterization. Cardiac catheterization is used to diagnose and/or treat various heart conditions. Doctors may recommend this procedure for a number of different reasons. The most common reason is to evaluate chest pain. Chest pain can be a symptom of coronary artery disease (CAD), and cardiac catheterization can show whether plaque is narrowing or blocking your heart's arteries. This procedure is also used to evaluate the valves, as well as measure the blood flow and oxygen levels in different parts of your heart. For further information please visit HugeFiesta[facility]tn. Please follow instruction sheet, as given.   Follow-Up: Your physician recommends that you schedule a follow-up appointment in: 2-3 weeks after catheterization with a PA or NP.    Any Other Special Instructions Will Be Listed Below (If Applicable).    [place] OFFICE [address] [place] [address] [place] [address] Dept: [phone] Loc: [place] [facility]  11/12/2016  You are scheduled for a Cardiac Catheterization on Friday, September 21 with Dr. Daneen Schick.  1. Please arrive at the [facility] (Main Entrance A) at [facility]: [address] [place], [place] [address] at 11:30 AM (two hours before your procedure to ensure your preparation). Free valet parking service is available.   Special note: Every effort is made to have your procedure done on time. Please understand that emergencies sometimes delay scheduled procedures.  2. Diet: Do not eat or drink anything after midnight prior to your procedure except sips of water to take medications.  3. Labs: You will have labs drawn today  4. Medication instructions in preparation for your procedure:  You may take all of  your medications with a sip of water.  On the morning of your procedure, take your Aspirin and any morning medicines NOT listed above.  You may use sips of water.  5. Plan for one night stay--bring personal belongings. 6. Bring a current list of your medications and current insurance cards. 7. You MUST have a responsible person to drive you home. 8. Someone MUST be with you the first 24 hours after you arrive home or your discharge will be delayed. 9. Please wear clothes that are easy to get on and off and wear slip-on shoes.  Thank you for allowing [place] to care for you!   -- [place] [facility]    If you need a refill on your cardiac medications before your next appointment, please call your pharmacy.

## 2016-11-13 LAB — BASIC METABOLIC PANEL
BUN/Creatinine Ratio: 13 (ref 9–23)
BUN: 10 mg/dL (ref 6–24)
CO2: 24 mmol/L (ref 20–29)
Calcium: 9.9 mg/dL (ref 8.7–10.2)
Chloride: 106 mmol/L (ref 96–106)
Creatinine, Ser: 0.77 mg/dL (ref 0.57–1.00)
GFR calc Af Amer: 98 mL/min/{1.73_m2} (ref >59)
GFR, EST NON AFRICAN AMERICAN: 85 mL/min/{1.73_m2} (ref >59)
GLUCOSE: 93 mg/dL (ref 65–99)
POTASSIUM: 4.4 mmol/L (ref 3.5–5.2)
SODIUM: 148 mmol/L — AB (ref 134–144)

## 2016-11-13 LAB — CBC
HEMATOCRIT: 41.1 % (ref 34.0–46.6)
Hemoglobin: 13.7 g/dL (ref 11.1–15.9)
MCH: 29.7 pg (ref 26.6–33.0)
MCHC: 33.3 g/dL (ref 31.5–35.7)
MCV: 89 fL (ref 79–97)
PLATELETS: 231 10*3/uL (ref 150–379)
RBC: 4.61 x10E6/uL (ref 3.77–5.28)
RDW: 13.2 % (ref 12.3–15.4)
WBC: 7.9 10*3/uL (ref 3.4–10.8)

## 2016-11-13 LAB — PROTIME-INR
INR: 1 (ref 0.8–1.2)
Prothrombin Time: 10.1 s (ref 9.1–12.0)

## 2016-11-14 ENCOUNTER — Telehealth: Payer: Self-pay

## 2016-11-14 NOTE — Telephone Encounter (Signed)
Patient contacted pre-catheterization at [facility] scheduled for:  11/15/2016 @ 1330 Verified arrival time and place:  NT @ 1130 Confirmed AM meds to be taken pre-cath with sip of water: Take ASA Confirmed patient has responsible person to drive home post procedure and observe patient for 24 hours:  yes Addl concerns:  none

## 2016-11-15 ENCOUNTER — Ambulatory Visit (HOSPITAL_COMMUNITY)
Admission: RE | Admit: 2016-11-15 | Discharge: 2016-11-15 | Disposition: A | Discharge status: Home / Self Care | Payer: BLUE CROSS/BLUE SHIELD | Source: Ambulatory Visit | Attending: Interventional Cardiology | Admitting: Interventional Cardiology

## 2016-11-15 ENCOUNTER — Encounter (HOSPITAL_COMMUNITY)
Admission: RE | Disposition: A | Discharge status: Home / Self Care | Payer: Self-pay | Source: Ambulatory Visit | Attending: Interventional Cardiology

## 2016-11-15 DIAGNOSIS — I10 Essential (primary) hypertension: Secondary | ICD-10-CM | POA: Diagnosis present

## 2016-11-15 DIAGNOSIS — Z7982 Long term (current) use of aspirin: Secondary | ICD-10-CM | POA: Diagnosis not present

## 2016-11-15 DIAGNOSIS — K219 Gastro-esophageal reflux disease without esophagitis: Secondary | ICD-10-CM | POA: Diagnosis not present

## 2016-11-15 DIAGNOSIS — Z87891 Personal history of nicotine dependence: Secondary | ICD-10-CM | POA: Diagnosis not present

## 2016-11-15 DIAGNOSIS — R931 Abnormal findings on diagnostic imaging of heart and coronary circulation: Secondary | ICD-10-CM | POA: Insufficient documentation

## 2016-11-15 DIAGNOSIS — Z7901 Long term (current) use of anticoagulants: Secondary | ICD-10-CM | POA: Diagnosis not present

## 2016-11-15 DIAGNOSIS — I110 Hypertensive heart disease with heart failure: Secondary | ICD-10-CM | POA: Insufficient documentation

## 2016-11-15 DIAGNOSIS — Z9889 Other specified postprocedural states: Secondary | ICD-10-CM

## 2016-11-15 DIAGNOSIS — J45909 Unspecified asthma, uncomplicated: Secondary | ICD-10-CM | POA: Insufficient documentation

## 2016-11-15 DIAGNOSIS — I447 Left bundle-branch block, unspecified: Secondary | ICD-10-CM | POA: Diagnosis not present

## 2016-11-15 DIAGNOSIS — I5042 Chronic combined systolic (congestive) and diastolic (congestive) heart failure: Secondary | ICD-10-CM | POA: Insufficient documentation

## 2016-11-15 DIAGNOSIS — E785 Hyperlipidemia, unspecified: Secondary | ICD-10-CM | POA: Insufficient documentation

## 2016-11-15 DIAGNOSIS — R079 Chest pain, unspecified: Secondary | ICD-10-CM | POA: Diagnosis not present

## 2016-11-15 HISTORY — PX: LEFT HEART CATH AND CORONARY ANGIOGRAPHY: CATH118249

## 2016-11-15 HISTORY — DX: Other specified postprocedural states: Z98.890

## 2016-11-15 SURGERY — LEFT HEART CATH AND CORONARY ANGIOGRAPHY
Anesthesia: LOCAL

## 2016-11-15 MED ORDER — HEPARIN (PORCINE) IN NACL 2-0.9 UNIT/ML-% IJ SOLN
INTRAMUSCULAR | Status: DC | PRN
  Administered 2016-11-15: 10 mL via INTRA_ARTERIAL

## 2016-11-15 MED ORDER — FENTANYL CITRATE (PF) 100 MCG/2ML IJ SOLN
INTRAMUSCULAR | Status: AC
  Filled 2016-11-15: qty 2

## 2016-11-15 MED ORDER — FENTANYL CITRATE (PF) 100 MCG/2ML IJ SOLN
INTRAMUSCULAR | Status: DC | PRN
  Administered 2016-11-15 (×3): 50 ug via INTRAVENOUS

## 2016-11-15 MED ORDER — SODIUM CHLORIDE 0.9% FLUSH: 3.0000 mL | INTRAVENOUS | Status: DC | PRN

## 2016-11-15 MED ORDER — HEPARIN SODIUM (PORCINE) 1000 UNIT/ML IJ SOLN
INTRAMUSCULAR | Status: AC
  Filled 2016-11-15: qty 2

## 2016-11-15 MED ORDER — LIDOCAINE HCL (PF) 1 % IJ SOLN
INTRAMUSCULAR | Status: DC | PRN
  Administered 2016-11-15: 2 mL

## 2016-11-15 MED ORDER — HEPARIN (PORCINE) IN NACL 2-0.9 UNIT/ML-% IJ SOLN
INTRAMUSCULAR | Status: AC
Start: 2016-11-15 — End: 2016-11-15
  Filled 2016-11-15: qty 1000

## 2016-11-15 MED ORDER — VERAPAMIL HCL 2.5 MG/ML IV SOLN
INTRAVENOUS | Status: AC
  Filled 2016-11-15: qty 2

## 2016-11-15 MED ORDER — HEPARIN (PORCINE) IN NACL 2-0.9 UNIT/ML-% IJ SOLN
INTRAMUSCULAR | Status: AC | PRN
  Administered 2016-11-15: 1000 mL

## 2016-11-15 MED ORDER — MIDAZOLAM HCL 2 MG/2ML IJ SOLN
INTRAMUSCULAR | Status: DC | PRN
  Administered 2016-11-15 (×3): 1 mg via INTRAVENOUS

## 2016-11-15 MED ORDER — MIDAZOLAM HCL 2 MG/2ML IJ SOLN
INTRAMUSCULAR | Status: AC
  Filled 2016-11-15: qty 2

## 2016-11-15 MED ORDER — ACETAMINOPHEN 325 MG PO TABS: 650.0000 mg | ORAL_TABLET | ORAL | Status: DC | PRN

## 2016-11-15 MED ORDER — SODIUM CHLORIDE 0.9% FLUSH: 3.0000 mL | Freq: Two times a day (BID) | INTRAVENOUS | Status: DC

## 2016-11-15 MED ORDER — SODIUM CHLORIDE 0.9 % IV SOLN: 250.0000 mL | INTRAVENOUS | Status: DC | PRN

## 2016-11-15 MED ORDER — SODIUM CHLORIDE 0.9 % IV SOLN: INTRAVENOUS | Status: DC

## 2016-11-15 MED ORDER — SODIUM CHLORIDE 0.9 % WEIGHT BASED INFUSION: 1.0000 mL/kg/h | INTRAVENOUS | Status: DC

## 2016-11-15 MED ORDER — ONDANSETRON HCL 4 MG/2ML IJ SOLN: 4.0000 mg | Freq: Four times a day (QID) | INTRAMUSCULAR | Status: DC | PRN

## 2016-11-15 MED ORDER — HEPARIN SODIUM (PORCINE) 1000 UNIT/ML IJ SOLN
INTRAMUSCULAR | Status: DC | PRN
  Administered 2016-11-15: 4500 [IU] via INTRAVENOUS

## 2016-11-15 MED ORDER — IOPAMIDOL (ISOVUE-370) INJECTION 76%
INTRAVENOUS | Status: DC | PRN
  Administered 2016-11-15: 75 mL via INTRA_ARTERIAL

## 2016-11-15 MED ORDER — ASPIRIN 81 MG PO CHEW: 81.0000 mg | CHEWABLE_TABLET | ORAL | Status: DC

## 2016-11-15 MED ORDER — IOPAMIDOL (ISOVUE-370) INJECTION 76%
INTRAVENOUS | Status: AC
  Filled 2016-11-15: qty 100

## 2016-11-15 MED ORDER — SODIUM CHLORIDE 0.9 % WEIGHT BASED INFUSION
3.0000 mL/kg/h | INTRAVENOUS | Status: AC
  Administered 2016-11-15: 3 mL/kg/h via INTRAVENOUS

## 2016-11-15 SURGICAL SUPPLY — 12 items
CATH INFINITI 4FR JL3.5 (CATHETERS) ×1 IMPLANT
CATH INFINITI JR4 5F (CATHETERS) ×1 IMPLANT
CATH INFINITI MULTIPACK ANG 4F (CATHETERS) ×1 IMPLANT
DEVICE RAD COMP TR BAND LRG (VASCULAR PRODUCTS) ×1 IMPLANT
GLIDESHEATH SLEND A-KIT 6F 22G (SHEATH) ×1 IMPLANT
GUIDEWIRE INQWIRE 1.5J.035X260 (WIRE) IMPLANT
INQWIRE 1.5J .035X260CM (WIRE) ×2
KIT HEART LEFT (KITS) ×2 IMPLANT
PACK CARDIAC CATHETERIZATION (CUSTOM PROCEDURE TRAY) ×2 IMPLANT
TRANSDUCER W/STOPCOCK (MISCELLANEOUS) ×2 IMPLANT
TUBING CIL FLEX 10 FLL-RA (TUBING) ×2 IMPLANT
WIRE HI TORQ VERSACORE-J 145CM (WIRE) ×1 IMPLANT

## 2016-11-15 NOTE — Discharge Instructions (Signed)

## 2016-11-15 NOTE — Interval H&P Note (Signed)
Cath Lab Visit (complete for each Cath Lab visit)  Clinical Evaluation Leading to the Procedure:   ACS: No.  Non-ACS:    Anginal Classification: CCS III  Anti-ischemic medical therapy: Minimal Therapy (1 class of medications)  Non-Invasive Test Results: Intermediate-risk stress test findings: cardiac mortality 1-3%/year  Prior CABG: No previous CABG      History and Physical Interval Note:  11/15/2016 2:53 PM  Caitlin Wall  has presented today for surgery, with the diagnosis of abnormal nuc  The various methods of treatment have been discussed with the patient and family. After consideration of risks, benefits and other options for treatment, the patient has consented to  Procedure(s): RIGHT/LEFT HEART CATH AND CORONARY ANGIOGRAPHY (N/A) as a surgical intervention .  The patient's history has been reviewed, patient examined, no change in status, stable for surgery.  I have reviewed the patient's chart and labs.  Questions were answered to the patient's satisfaction.     Belva Crome III

## 2016-11-15 NOTE — H&P (View-Only) (Signed)
Cardiology Office Note    Date:  11/12/2016   ID:  Caitlin Wall, DOB 11-Jun-1955, MRN [id number]  PCP:  Biagio Borg, MD  Cardiologist: Sinclair Grooms, MD   Chief Complaint  Patient presents with  . Advice Only    Abnormal stress test  . Chest Pain    History of Present Illness:  Caitlin Wall is a 61 y.o. female with long-standing history of left bundle branch block (at least 2013), essential hypertension, prior smoker, hyperlipidemia, and recent chest discomfort. She is referred by Dr. [name] after cardiac perfusion imaging returned abnormal.  The patient has known left bundle branch block. For the past 2-3 months she has noted a sensation of pressure in her chest. This is nearly continuous. The discomfort started spontaneously. It is not aggravated by physical activity. She occasionally has lightheaded and dizzy feeling. She also feels as though her heart skips a beat or has irregular rhythm. Because of this an echocardiogram and pharmacologic myocardial perfusion study wanted. Echocardiogram demonstrated normal reduced left ventricular systolic function with an estimated EF in the 40-45% range. Diffuse hypokinesis was felt to be present. All the same day, a pharmacologic myocardial perfusion study was performed and demonstrated normal EF with moderate anterior anteroapical and lateral apical perfusion abnormality. She is referred because of these findings. Currently the patient states that there is mild discomfort in her chest which is been present now for weeks. Physical activity slightly worsens discomfort. She denies orthopnea.  Past Medical History:  Diagnosis Date  . Asthma, persistent not controlled 07/27/2012  . Breast cancer ([place]) 12/28/14   Left BresastDCIS  . COLONIC POLYPS, HX OF 11/04/2007  . GERD 11/04/2007  . GERD (gastroesophageal reflux disease)   . HAIR LOSS 11/04/2007  . Hx of colonic polyps 1995   adenomatous  . Hyperlipidemia   . HYPERLIPIDEMIA  11/04/2007  . Hypertension   . HYPERTENSION 06/07/2009  . LBBB (left bundle branch block) 07/26/2011  . S/P radiation therapy 02/02/15-03/24/15   left breast 60.4GY    Past Surgical History:  Procedure Laterality Date  . ABDOMINAL HYSTERECTOMY    . ANTERIOR FUSION CERVICAL SPINE    . bilat ear surgury/hearing loss    . BREAST LUMPECTOMY WITH RADIOACTIVE SEED LOCALIZATION Left 12/28/2014   Procedure: BREAST LUMPECTOMY WITH RADIOACTIVE SEED LOCALIZATION;  Surgeon: Autumn Messing III, MD;  Location: [facility];  Service: General;  Laterality: Left;    Current Medications: Outpatient Medications Prior to Visit  Medication Sig Dispense Refill  . anastrozole (ARIMIDEX) 1 MG tablet TAKE ONE TABLET BY MOUTH ONCE DAILY 90 tablet 4  . aspirin 81 MG tablet Take 81 mg by mouth daily.      . cholecalciferol (VITAMIN D) 1000 units tablet Take 1 tablet (1,000 Units total) by mouth daily. 90 tablet 4  . ibuprofen (ADVIL,MOTRIN) 200 MG tablet Take 200 mg by mouth as needed.    Marland Kitchen losartan (COZAAR) 100 MG tablet Take 1 tablet (100 mg total) by mouth daily. 90 tablet 3  . Omega-3 Fatty Acids (FISH OIL) 1000 MG CAPS Take 1,000 mg by mouth daily. Reported on 05/04/2015    . rivaroxaban (XARELTO) 20 MG TABS tablet Take 1 tablet (20 mg total) by mouth daily with supper. To start after take xarelto starter pack given at office visit (Patient not taking: Reported on 11/12/2016) 30 tablet 4   No facility-administered medications prior to visit.      Allergies:   Codeine  Social History   Social History  . Marital status: Married    Spouse name: N/A  . Number of children: 3  . Years of education: N/A   Occupational History  . accounts receivable [employer]   Social History Main Topics  . Smoking status: Former Smoker    Packs/day: 1.00    Years: 20.00    Types: Cigarettes    Quit date: 08/19/1989  . Smokeless tobacco: Never Used  . Alcohol use No  . Drug use: No  . Sexual activity:  Not Asked   Other Topics Concern  . None   Social History Narrative  . None     Family History:  The patient's family history includes Breast cancer in her maternal grandmother and other; Cancer in her paternal grandmother; Healthy in her mother; Lung cancer in her father.   ROS:   Please see the history of present illness.    Prior smoker. Ductal carcinoma of the left breast was diagnosed 2 years ago. Did not require chemotherapy. Received lumpectomy and radiation. This was followed by breast implant. Notes easy bruising and constipation.  All other systems reviewed and are negative.   PHYSICAL EXAM:   VS:  BP 126/90 (BP Location: Left Arm)   Pulse (!) 56   Ht 5\' 5"  (1.651 m)   Wt 197 lb 9.6 oz (89.6 kg)   BMI 32.88 kg/m    GEN: Well nourished, well developed, in no acute distress  HEENT: normal  Neck: no JVD, carotid bruits, or masses Cardiac:An S4 gallop is noted. P2 is increased in intensity. RRR; no murmurs, rubs, no edema . Respiratory:  clear to auscultation bilaterally, normal work of breathing GI: soft, nontender, nondistended, + BS MS: no deformity or atrophy  Skin: warm and dry, no rash Neuro:  Alert and Oriented x 3, Strength and sensation are intact Psych: euthymic mood, full affect  Wt Readings from Last 3 Encounters:  11/12/16 197 lb 9.6 oz (89.6 kg)  10/23/16 195 lb (88.5 kg)  07/11/16 198 lb 9.6 oz (90.1 kg)      Studies/Labs Reviewed:   EKG:  EKG  EKG performed on 10/23/2016 reveals left bundle-branch block with QRS duration 152 ms.  Recent Labs: 10/23/2016: ALT 15; BUN 13; Creatinine, Ser 0.75; Hemoglobin 14.4; Platelets 202.0; Potassium 3.5; Sodium 143; TSH 1.96   Lipid Panel    Component Value Date/Time   CHOL 161 10/23/2016 0758   TRIG 110.0 10/23/2016 0758   HDL 37.60 (L) 10/23/2016 0758   CHOLHDL 4 10/23/2016 0758   VLDL 22.0 10/23/2016 0758   LDLCALC 101 (H) 10/23/2016 0758    Additional studies/ records that were reviewed today  include:  Echocardiography 11/01/2016:  ------------------------------------------------------------------- Study Conclusions   - Left ventricle: The cavity size was mildly dilated. Systolic   function was mildly to moderately reduced. The estimated ejection   fraction was in the range of 40% to 45%. Mild diffuse   hypokinesis. There was an increased relative contribution of   atrial contraction to ventricular filling. Doppler parameters are   consistent with abnormal left ventricular relaxation (grade 1   diastolic dysfunction).  Myocardial perfusion imaging 11/01/2016: Study Highlights   Nuclear stress EF: 54%. The left ventricular ejection fraction is normal (55-65%).  Defect 1: There is a medium defect of moderate severity present in the mid anteroseptal, apical anterior, apical septal, apical lateral and apex location. This defect is most likely due to breast attenuation artifact.  This is a low risk  study.     ASSESSMENT:    1. Chest discomfort   2. Chronic combined systolic and diastolic heart failure ([place])   3. Left bundle branch block   4. Ductal carcinoma of left breast ([place])   5. Abnormal nuclear stress test      PLAN:  In order of problems listed above:  1. She has typical and atypical features. The quality of the discomfort is concerning. The continuous nature is not typical of ischemic heart disease. Under the circumstances where the nuclear study suggests a perfusion abnormality and the echo suggests decreased LV function, coronary angiography seems most logical next step to identify anatomy and help guide diagnosis and management. The procedure and risks were discussed in detail as outlined below. 2. The echocardiogram is usually more accurate for EF assessment and therefore she appears to have new reduction in LV function. We need to exclude the possibility of CAD as a contributed to decrease function. If we verified poor LV function by ventriculography at cath, we  will need to further escalate heart failure therapy to include beta blocker and possibly Aldactone. It is possible that much of the sensation of fullness in the chest is related to volume overload. 3. Chronic 4. Not addressed 5. As noted above in this context I believe coronary angiography is indicated  The patient was counseled to undergo left heart catheterization, coronary angiography, and possible percutaneous coronary intervention with stent implantation. The procedural risks and benefits were discussed in detail. The risks discussed included death, stroke, myocardial infarction, life-threatening bleeding, limb ischemia, kidney injury, allergy, and possible emergency cardiac surgery. The risk of these significant complications were estimated to occur less than 1% of the time. After discussion, the patient has agreed to proceed.  Medication Adjustments/Labs and Tests Ordered: Current medicines are reviewed at length with the patient today.  Concerns regarding medicines are outlined above.  Medication changes, Labs and Tests ordered today are listed in the Patient Instructions below. Patient Instructions  Medication Instructions:  None  Labwork: CBC, INR and BMET today  Testing/Procedures: Your physician has requested that you have a cardiac catheterization. Cardiac catheterization is used to diagnose and/or treat various heart conditions. Doctors may recommend this procedure for a number of different reasons. The most common reason is to evaluate chest pain. Chest pain can be a symptom of coronary artery disease (CAD), and cardiac catheterization can show whether plaque is narrowing or blocking your heart's arteries. This procedure is also used to evaluate the valves, as well as measure the blood flow and oxygen levels in different parts of your heart. For further information please visit [email or web address]. Please follow instruction sheet, as given.   Follow-Up: Your physician recommends  that you schedule a follow-up appointment in: 2-3 weeks after catheterization with a PA or NP.    Any Other Special Instructions Will Be Listed Below (If Applicable).    [facility] [facility] OFFICE [address], [place] [address] [place] [address] Dept: [phone] Loc: [facility]  11/12/2016  You are scheduled for a Cardiac Catheterization on Friday, September 21 with Dr. Daneen Schick.  1. Please arrive at the [facility] (Main Entrance A) at [facility]: [address] [place], [place] [address] at 11:30 AM (two hours before your procedure to ensure your preparation). Free valet parking service is available.   Special note: Every effort is made to have your procedure done on time. Please understand  that emergencies sometimes delay scheduled procedures.  2. Diet: Do not eat or drink anything after midnight prior to your procedure except sips of water to take medications.  3. Labs: You will have labs drawn today  4. Medication instructions in preparation for your procedure:  You may take all of your medications with a sip of water.  On the morning of your procedure, take your Aspirin and any morning medicines NOT listed above.  You may use sips of water.  5. Plan for one night stay--bring personal belongings. 6. Bring a current list of your medications and current insurance cards. 7. You MUST have a responsible person to drive you home. 8. Someone MUST be with you the first 24 hours after you arrive home or your discharge will be delayed. 9. Please wear clothes that are easy to get on and off and wear slip-on shoes.  Thank you for allowing [place] to care for you!   -- [facility]    If you need a refill on your cardiac medications before your next appointment, please call your pharmacy.      Signed, Sinclair Grooms, MD  11/12/2016 6:04 PM      [place], [place],   [address] Phone: [phone]; Fax: [phone]

## 2016-11-18 ENCOUNTER — Encounter (HOSPITAL_COMMUNITY): Payer: Self-pay | Admitting: Interventional Cardiology

## 2016-11-22 ENCOUNTER — Encounter: Payer: Self-pay | Admitting: Physician Assistant

## 2016-12-03 NOTE — Progress Notes (Signed)
Cardiology Office Note:    Date:  12/04/2016   ID:  Caitlin Wall, DOB December 09, 1955, MRN [id number]  PCP:  Biagio Borg, MD  Cardiologist:  Dr. Tamala Julian  Referring MD: Biagio Borg, MD   Chief Complaint  Patient presents with  . Hospitalization Follow-up    Post cath    History of Present Illness:    Caitlin Wall is a 61 y.o. female with a past medical history significant for long-standing history of LBBB (at least 2013), essential hypertension, prior smoker and hyperlipidemia. The patient had a 2 to 3 month history of chest pressure that was nearly continuous. Her primary care provider ordered an echocardiogram which showed an EF of 40-45% and diffuse hypokinesis. A pharmacologic myocardial perfusion study was also done and demonstrated normal EF with moderate anterior anteroapical and lateral apical perfusion abnormality. She was referred to our office and seen by Dr. Tamala Julian. Dr. Tamala Julian noted her to have some typical and atypical features to her chest discomfort and a cardiac catheterization was arranged to evaluate coronary arteries and further investigate her reduced LV function.  Cardiac catheterization on 11/15/16 revealed normal coronary arteries and EF 50-55% with normal EDP. Her myocardial perfusion imaging was falsely positive. It was thought that musculoskeletal and/or GI etiology was the most likely explanation for her chest discomfort.  Today she arrives today with her husband for post cath follow up.  She continues to have constant chest pressure not related to meals and not aggravated by movement. She denies any difficulty breathing, orthopnea, PND, edema, palpitations. Her husband does report that the patient eats a lot of [place] and she likes spicy food. She has bee active and her discomfort does not impede any of her activities.   Past Medical History:  Diagnosis Date  . Asthma, persistent not controlled 07/27/2012  . Breast cancer ([place]) 12/28/14   Left  BresastDCIS  . COLONIC POLYPS, HX OF 11/04/2007  . GERD 11/04/2007  . GERD (gastroesophageal reflux disease)   . HAIR LOSS 11/04/2007  . Hx of cardiac cath 11/15/2016   a. LHC 11/15/16 showed normal coronaries and EF 50-55% with normal EDP. (done for false positive NST)  . Hx of colonic polyps 1995   adenomatous  . Hyperlipidemia   . HYPERLIPIDEMIA 11/04/2007  . Hypertension   . HYPERTENSION 06/07/2009  . LBBB (left bundle branch block) 07/26/2011  . S/P radiation therapy 02/02/15-03/24/15   left breast 60.4GY    Past Surgical History:  Procedure Laterality Date  . ABDOMINAL HYSTERECTOMY    . ANTERIOR FUSION CERVICAL SPINE    . bilat ear surgury/hearing loss    . BREAST LUMPECTOMY WITH RADIOACTIVE SEED LOCALIZATION Left 12/28/2014   Procedure: BREAST LUMPECTOMY WITH RADIOACTIVE SEED LOCALIZATION;  Surgeon: Autumn Messing III, MD;  Location: [facility];  Service: General;  Laterality: Left;  . LEFT HEART CATH AND CORONARY ANGIOGRAPHY N/A 11/15/2016   Procedure: LEFT HEART CATH AND CORONARY ANGIOGRAPHY;  Surgeon: Belva Crome, MD;  Location: [facility];  Service: Cardiovascular;  Laterality: N/A;    Current Medications: Current Meds  Medication Sig  . anastrozole (ARIMIDEX) 1 MG tablet TAKE ONE TABLET BY MOUTH ONCE DAILY  . aspirin EC 81 MG tablet Take 81 mg by mouth daily.  Marland Kitchen ibuprofen (ADVIL,MOTRIN) 200 MG tablet Take 200 mg by mouth every 8 (eight) hours as needed (for pain.).   Marland Kitchen losartan (COZAAR) 100 MG tablet Take 1 tablet (100 mg total) by mouth daily.  Marland Kitchen  Multiple Vitamin (MULTIVITAMIN WITH MINERALS) TABS tablet Take 1 tablet by mouth daily.     Allergies:   Codeine   Social History   Social History  . Marital status: Married    Spouse name: N/A  . Number of children: 3  . Years of education: N/A   Occupational History  . accounts receivable [employer]   Social History Main Topics  . Smoking status: Former Smoker    Packs/day: 1.00    Years:  20.00    Types: Cigarettes    Quit date: 08/19/1989  . Smokeless tobacco: Never Used  . Alcohol use No  . Drug use: No  . Sexual activity: Not Asked   Other Topics Concern  . None   Social History Narrative  . None     Family History: The patient's family history includes Breast cancer in her maternal grandmother and other; Cancer in her paternal grandmother; Healthy in her mother; Lung cancer in her father. There is no history of Coronary artery disease, Colon cancer, Pancreatic cancer, Rectal cancer, or Stomach cancer. ROS:   Please see the history of present illness.    All other systems reviewed and are negative.  EKGs/Labs/Other Studies Reviewed:    The following studies were reviewed today:  Catheterization 11/15/2016 Conclusion   Normal coronary arteries. Left dominant coronary circulation.  Normal left ventricular function. EF 50-55%. Normal EDP. RECOMMENDATIONS: No further cardiac workup for chest discomfort. Musculoskeletal and/or GI are most likely explanations. Myocardial perfusion imaging is false positive.   Echocardiogram 11/01/2016 Study Conclusions - Left ventricle: The cavity size was mildly dilated. Systolic   function was mildly to moderately reduced. The estimated ejection   fraction was in the range of 40% to 45%. Mild diffuse   hypokinesis. There was an increased relative contribution of   atrial contraction to ventricular filling. Doppler parameters are   consistent with abnormal left ventricular relaxation (grade 1   diastolic dysfunction).  Myocardial perfusion imaging 11/01/2016: Study Highlights   Nuclear stress EF: 54%. The left ventricular ejection fraction is normal (55-65%).  Defect 1: There is a medium defect of moderate severity present in the mid anteroseptal, apical anterior, apical septal, apical lateral and apex location. This defect is most likely due to breast attenuation artifact.  This is a low risk study.     EKG:  EKG is   ordered today.  The ekg ordered today demonstrates Normal sinus rhythm with left bundle branch block at 82 bpm  Recent Labs: 10/23/2016: ALT 15; TSH 1.96 11/12/2016: BUN 10; Creatinine, Ser 0.77; Hemoglobin 13.7; Platelets 231; Potassium 4.4; Sodium 148   Recent Lipid Panel    Component Value Date/Time   CHOL 161 10/23/2016 0758   TRIG 110.0 10/23/2016 0758   HDL 37.60 (L) 10/23/2016 0758   CHOLHDL 4 10/23/2016 0758   VLDL 22.0 10/23/2016 0758   LDLCALC 101 (H) 10/23/2016 0758    Physical Exam:    VS:  BP (!) 158/82   Pulse 82   Ht 5\' 5"  (1.651 m)   Wt 199 lb 12.8 oz (90.6 kg)   SpO2 94%   BMI 33.25 kg/m     Wt Readings from Last 3 Encounters:  12/04/16 199 lb 12.8 oz (90.6 kg)  11/15/16 195 lb (88.5 kg)  11/12/16 197 lb 9.6 oz (89.6 kg)     Physical Exam  Constitutional: She is oriented to person, place, and time. She appears well-developed and well-nourished. No distress.  HENT:  Head: Normocephalic and  atraumatic.  Neck: Normal range of motion. Neck supple. No JVD present.  Cardiovascular: Normal rate, regular rhythm and normal heart sounds.  Exam reveals no gallop and no friction rub.   No murmur heard. Pulmonary/Chest: Effort normal and breath sounds normal. No respiratory distress. She has no wheezes. She has no rales.  Abdominal: Soft. Bowel sounds are normal. She exhibits no distension. There is no tenderness.  Musculoskeletal: Normal range of motion. She exhibits no edema or deformity.  Neurological: She is alert and oriented to person, place, and time.  Skin: Skin is warm and dry.  Psychiatric: She has a normal mood and affect. Her behavior is normal. Thought content normal.     ASSESSMENT:    1. S/P cardiac cath   2. Essential hypertension    PLAN:    In order of problems listed above:  1. S/P cardiac catheterization on 11/15/16 for chest pain with some typical and atypical features. Echocardiogram showed EF 40-45% and the patient had an abnormal  stress test. Subsequent cardiac catheterization showed normal coronary arteries and normal EF. It was thought that the stress test was a false positive and her discomfort was due to a musculoskeletal and/or GI origin. The patient has continued mild chest pressure without any other associated symptoms, not aggravated by foods, certain positions or movements. She has no heart failure-like symptoms. The patient's has been does report that the patient likes to eat [facility] on most of her food. We discussed the possibility of her having an acid reflux flare and measures that she can take to possibly reduce her discomfort including not eating prior to bedtime and avoiding offending foods. I've advised her to try an acid reducing medication such as Zantac or Pepcid for several weeks and avoid [facility]. If her symptoms do not improve she probably ought to see a gastroenterologist.  2.  Hypertension: Treated with losartan 100 mg daily. Blood pressure is elevated today at 158/82. On looking back at previous office notes patient's blood pressure has been elevated. We'll add amlodipine 5 mg daily and continue losartan. We'll follow-up in 2 months to assess adequate blood pressure management.    Medication Adjustments/Labs and Tests Ordered: Current medicines are reviewed at length with the patient today.  Concerns regarding medicines are outlined above. Labs and tests ordered and medication changes are outlined in the patient instructions below:  Patient Instructions    Medication Instructions:  1. START AMLODIPINE 5 MG BY MOUTH DAILY. PRESCRIPTION HAS BEEN SENT TO YOUR PHARMACY  2. START OTC ZANTAC 150 MG BY MOUTH ONCE DAILY FOR 1 MONTH THEN STOP.   3. IF YOU DON'T DO ZANTAC, YOU CAN TAKE OTC PEPCID 20 MG BY MOUTH ONCE DAILY FOR 1 MONTH THEN STOP.   Labwork: NONE ORDERED  Testing/Procedures: NONE ORDERED  Follow-Up: FOLLOW UP WITH DR. Tamala Julian IN 2 MONTHS ON December 10 @ 9:00 AM  Any Other Special  Instructions Will Be Listed Below (If Applicable).     If you need a refill on your cardiac medications before your next appointment, please call your pharmacy.       Gastroesophageal Reflux Disease, Adult Normally, food travels down the esophagus and stays in the stomach to be digested. However, when a person has gastroesophageal reflux disease (GERD), food and stomach acid move back up into the esophagus. When this happens, the esophagus becomes sore and inflamed. Over time, GERD can create small holes (ulcers) in the lining of the esophagus. What are the causes? This  condition is caused by a problem with the muscle between the esophagus and the stomach (lower esophageal sphincter, or LES). Normally, the LES muscle closes after food passes through the esophagus to the stomach. When the LES is weakened or abnormal, it does not close properly, and that allows food and stomach acid to go back up into the esophagus. The LES can be weakened by certain dietary substances, medicines, and medical conditions, including:  Tobacco use.  Pregnancy.  Having a hiatal hernia.  Heavy alcohol use.  Certain foods and beverages, such as coffee, chocolate, onions, and peppermint.  What increases the risk? This condition is more likely to develop in:  People who have an increased body weight.  People who have connective tissue disorders.  People who use NSAID medicines.  What are the signs or symptoms? Symptoms of this condition include:  Heartburn.  Difficult or painful swallowing.  The feeling of having a lump in the throat.  Abitter taste in the mouth.  Bad breath.  Having a large amount of saliva.  Having an upset or bloated stomach.  Belching.  Chest pain.  Shortness of breath or wheezing.  Ongoing (chronic) cough or a night-time cough.  Wearing away of tooth enamel.  Weight loss.  Different conditions can cause chest pain. Make sure to see your health care  provider if you experience chest pain. How is this diagnosed? Your health care provider will take a medical history and perform a physical exam. To determine if you have mild or severe GERD, your health care provider may also monitor how you respond to treatment. You may also have other tests, including:  An endoscopy toexamine your stomach and esophagus with a small camera.  A test thatmeasures the acidity level in your esophagus.  A test thatmeasures how much pressure is on your esophagus.  A barium swallow or modified barium swallow to show the shape, size, and functioning of your esophagus.  How is this treated? The goal of treatment is to help relieve your symptoms and to prevent complications. Treatment for this condition may vary depending on how severe your symptoms are. Your health care provider may recommend:  Changes to your diet.  Medicine.  Surgery.  Follow these instructions at home: Diet  Follow a diet as recommended by your health care provider. This may involve avoiding foods and drinks such as: ? Coffee and tea (with or without caffeine). ? Drinks that containalcohol. ? Energy drinks and sports drinks. ? Carbonated drinks or sodas. ? Chocolate and cocoa. ? Peppermint and mint flavorings. ? Garlic and onions. ? Horseradish. ? Spicy and acidic foods, including peppers, chili powder, curry powder, vinegar, hot sauces, and barbecue sauce. ? Citrus fruit juices and citrus fruits, such as oranges, lemons, and limes. ? Tomato-based foods, such as red sauce, chili, salsa, and pizza with red sauce. ? Fried and fatty foods, such as donuts, french fries, potato chips, and high-fat dressings. ? High-fat meats, such as hot dogs and fatty cuts of red and white meats, such as rib eye steak, sausage, ham, and bacon. ? High-fat dairy items, such as whole milk, butter, and cream cheese.  Eat small, frequent meals instead of large meals.  Avoid drinking large amounts of  liquid with your meals.  Avoid eating meals during the 2-3 hours before bedtime.  Avoid lying down right after you eat.  Do not exercise right after you eat. General instructions  Pay attention to any changes in your symptoms.  Take over-the-counter and  prescription medicines only as told by your health care provider. Do not take aspirin, ibuprofen, or other NSAIDs unless your health care provider told you to do so.  Do not use any tobacco products, including cigarettes, chewing tobacco, and e-cigarettes. If you need help quitting, ask your health care provider.  Wear loose-fitting clothing. Do not wear anything tight around your waist that causes pressure on your abdomen.  Raise (elevate) the head of your bed 6 inches (15cm).  Try to reduce your stress, such as with yoga or meditation. If you need help reducing stress, ask your health care provider.  If you are overweight, reduce your weight to an amount that is healthy for you. Ask your health care provider for guidance about a safe weight loss goal.  Keep all follow-up visits as told by your health care provider. This is important. Contact a health care provider if:  You have new symptoms.  You have unexplained weight loss.  You have difficulty swallowing, or it hurts to swallow.  You have wheezing or a persistent cough.  Your symptoms do not improve with treatment.  You have a hoarse voice. Get help right away if:  You have pain in your arms, neck, jaw, teeth, or back.  You feel sweaty, dizzy, or light-headed.  You have chest pain or shortness of breath.  You vomit and your vomit looks like blood or coffee grounds.  You faint.  Your stool is bloody or black.  You cannot swallow, drink, or eat. This information is not intended to replace advice given to you by your health care provider. Make sure you discuss any questions you have with your health care provider. Document Released: 11/21/2004 Document Revised:  07/12/2015 Document Reviewed: 06/08/2014 Elsevier Interactive Patient Education  2017 [place], Daune Perch, NP  12/04/2016 9:03 AM    [place]

## 2016-12-04 ENCOUNTER — Encounter: Payer: Self-pay | Admitting: Physician Assistant

## 2016-12-04 ENCOUNTER — Ambulatory Visit (INDEPENDENT_AMBULATORY_CARE_PROVIDER_SITE_OTHER): Payer: BLUE CROSS/BLUE SHIELD | Admitting: Cardiology

## 2016-12-04 VITALS — BP 158/82 | HR 82 | Ht 65.0 in | Wt 199.8 lb

## 2016-12-04 DIAGNOSIS — Z9889 Other specified postprocedural states: Secondary | ICD-10-CM | POA: Diagnosis not present

## 2016-12-04 DIAGNOSIS — I10 Essential (primary) hypertension: Secondary | ICD-10-CM | POA: Diagnosis not present

## 2016-12-04 MED ORDER — RANITIDINE HCL 150 MG PO TABS: 150.0000 mg | ORAL_TABLET | Freq: Every day | ORAL | 0 refills | Status: DC

## 2016-12-04 MED ORDER — AMLODIPINE BESYLATE 5 MG PO TABS: 5.0000 mg | ORAL_TABLET | Freq: Every day | ORAL | 3 refills | Status: DC

## 2016-12-04 NOTE — Patient Instructions (Addendum)
Medication Instructions:  1. START AMLODIPINE 5 MG BY MOUTH DAILY. PRESCRIPTION HAS BEEN SENT TO YOUR PHARMACY  2. START OTC ZANTAC 150 MG BY MOUTH ONCE DAILY FOR 1 MONTH THEN STOP.   3. IF YOU DON'T DO ZANTAC, YOU CAN TAKE OTC PEPCID 20 MG BY MOUTH ONCE DAILY FOR 1 MONTH THEN STOP.   Labwork: NONE ORDERED  Testing/Procedures: NONE ORDERED  Follow-Up: FOLLOW UP WITH DR. Tamala Wall IN 2 MONTHS ON December 10 @ 9:00 AM  Any Other Special Instructions Will Be Listed Below (If Applicable).     If you need a refill on your cardiac medications before your next appointment, please call your pharmacy.       Gastroesophageal Reflux Disease, Adult Normally, food travels down the esophagus and stays in the stomach to be digested. However, when a person has gastroesophageal reflux disease (GERD), food and stomach acid move back up into the esophagus. When this happens, the esophagus becomes sore and inflamed. Over time, GERD can create small holes (ulcers) in the lining of the esophagus. What are the causes? This condition is caused by a problem with the muscle between the esophagus and the stomach (lower esophageal sphincter, or LES). Normally, the LES muscle closes after food passes through the esophagus to the stomach. When the LES is weakened or abnormal, it does not close properly, and that allows food and stomach acid to go back up into the esophagus. The LES can be weakened by certain dietary substances, medicines, and medical conditions, including:  Tobacco use.  Pregnancy.  Having a hiatal hernia.  Heavy alcohol use.  Certain foods and beverages, such as coffee, chocolate, onions, and peppermint.  What increases the risk? This condition is more likely to develop in:  People who have an increased body weight.  People who have connective tissue disorders.  People who use NSAID medicines.  What are the signs or symptoms? Symptoms of this condition  include:  Heartburn.  Difficult or painful swallowing.  The feeling of having a lump in the throat.  Abitter taste in the mouth.  Bad breath.  Having a large amount of saliva.  Having an upset or bloated stomach.  Belching.  Chest pain.  Shortness of breath or wheezing.  Ongoing (chronic) cough or a night-time cough.  Wearing away of tooth enamel.  Weight loss.  Different conditions can cause chest pain. Make sure to see your health care provider if you experience chest pain. How is this diagnosed? Your health care provider will take a medical history and perform a physical exam. To determine if you have mild or severe GERD, your health care provider may also monitor how you respond to treatment. You may also have other tests, including:  An endoscopy toexamine your stomach and esophagus with a small camera.  A test thatmeasures the acidity level in your esophagus.  A test thatmeasures how much pressure is on your esophagus.  A barium swallow or modified barium swallow to show the shape, size, and functioning of your esophagus.  How is this treated? The goal of treatment is to help relieve your symptoms and to prevent complications. Treatment for this condition may vary depending on how severe your symptoms are. Your health care provider may recommend:  Changes to your diet.  Medicine.  Surgery.  Follow these instructions at home: Diet  Follow a diet as recommended by your health care provider. This may involve avoiding foods and drinks such as: ? Coffee and tea (with or without caffeine). ?  Drinks that containalcohol. ? Energy drinks and sports drinks. ? Carbonated drinks or sodas. ? Chocolate and cocoa. ? Peppermint and mint flavorings. ? Garlic and onions. ? Horseradish. ? Spicy and acidic foods, including peppers, chili powder, curry powder, vinegar, hot sauces, and barbecue sauce. ? Citrus fruit juices and citrus fruits, such as oranges, lemons,  and limes. ? Tomato-based foods, such as red sauce, chili, salsa, and pizza with red sauce. ? Fried and fatty foods, such as donuts, french fries, potato chips, and high-fat dressings. ? High-fat meats, such as hot dogs and fatty cuts of red and white meats, such as rib eye steak, sausage, ham, and bacon. ? High-fat dairy items, such as whole milk, butter, and cream cheese.  Eat small, frequent meals instead of large meals.  Avoid drinking large amounts of liquid with your meals.  Avoid eating meals during the 2-3 hours before bedtime.  Avoid lying down right after you eat.  Do not exercise right after you eat. General instructions  Pay attention to any changes in your symptoms.  Take over-the-counter and prescription medicines only as told by your health care provider. Do not take aspirin, ibuprofen, or other NSAIDs unless your health care provider told you to do so.  Do not use any tobacco products, including cigarettes, chewing tobacco, and e-cigarettes. If you need help quitting, ask your health care provider.  Wear loose-fitting clothing. Do not wear anything tight around your waist that causes pressure on your abdomen.  Raise (elevate) the head of your bed 6 inches (15cm).  Try to reduce your stress, such as with yoga or meditation. If you need help reducing stress, ask your health care provider.  If you are overweight, reduce your weight to an amount that is healthy for you. Ask your health care provider for guidance about a safe weight loss goal.  Keep all follow-up visits as told by your health care provider. This is important. Contact a health care provider if:  You have new symptoms.  You have unexplained weight loss.  You have difficulty swallowing, or it hurts to swallow.  You have wheezing or a persistent cough.  Your symptoms do not improve with treatment.  You have a hoarse voice. Get help right away if:  You have pain in your arms, neck, jaw, teeth, or  back.  You feel sweaty, dizzy, or light-headed.  You have chest pain or shortness of breath.  You vomit and your vomit looks like blood or coffee grounds.  You faint.  Your stool is bloody or black.  You cannot swallow, drink, or eat. This information is not intended to replace advice given to you by your health care provider. Make sure you discuss any questions you have with your health care provider. Document Released: 11/21/2004 Document Revised: 07/12/2015 Document Reviewed: 06/08/2014 Elsevier Interactive Patient Education  2017 [employer].

## 2017-02-03 ENCOUNTER — Ambulatory Visit: Payer: BLUE CROSS/BLUE SHIELD | Admitting: Interventional Cardiology

## 2017-03-18 IMAGING — DX DG CHEST 2V
2 series · 2 of 2 positions shown · non-contrast
Comparison: Chest x-ray of October 02, 2012.

CLINICAL DATA: Routine chest exam, history of breast malignancy,
hypertension, asthma, and lung nodules.

EXAM:
CHEST  2 VIEW

[chest pa]
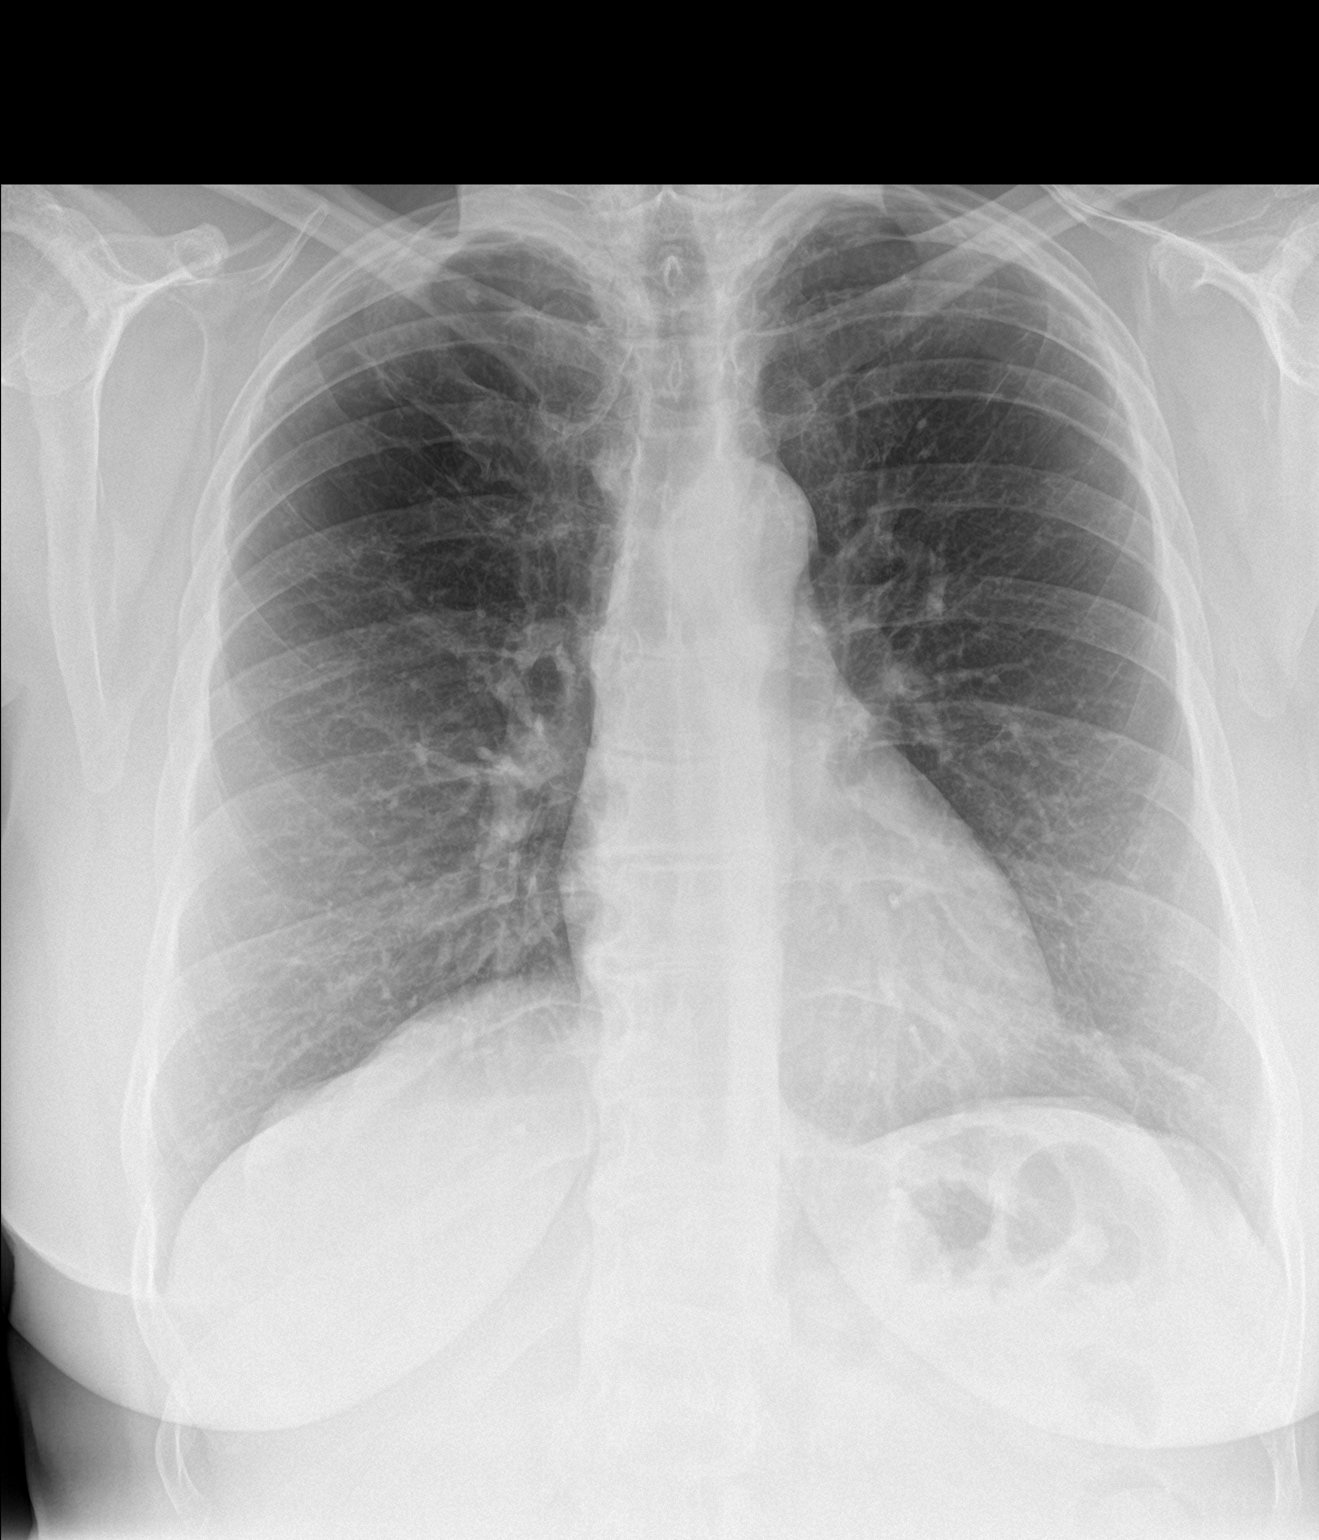

[chest lat]
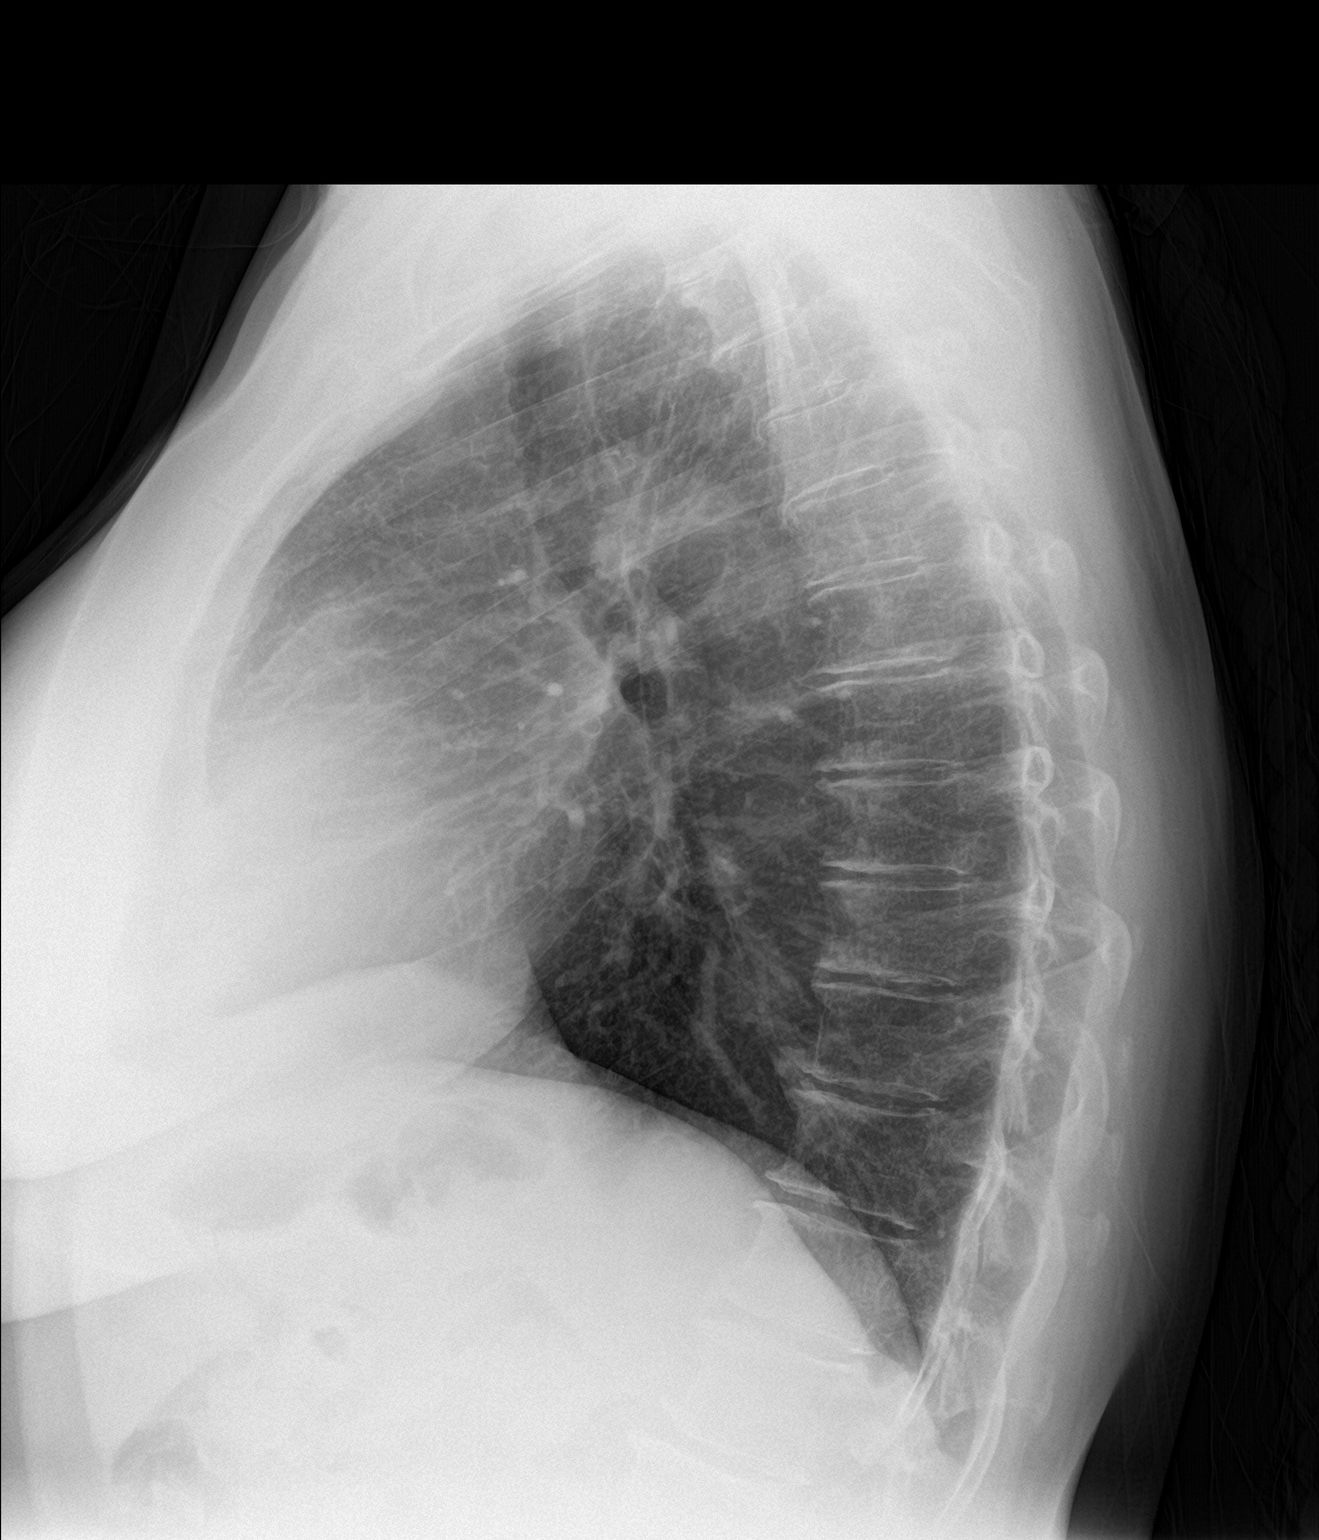

[2 of 2 positions shown; findings below may reference images not displayed]

FINDINGS: The lungs are adequately inflated. There is no focal infiltrate.
There is no pleural effusion. There is a stable 3 mm diameter
faintly calcified nodule in the right pulmonary apex. The heart and
pulmonary vascularity are normal. The mediastinum is normal in
width. The bony thorax exhibits no acute abnormality.
IMPRESSION: There is no active cardiopulmonary disease.

## 2017-03-27 NOTE — Progress Notes (Signed)
Cardiology Office Note    Date:  03/28/2017   ID:  Caitlin Wall, DOB March 25, 1955, MRN [id number]  PCP:  Caitlin Borg, MD  Cardiologist: Caitlin Grooms, MD   Chief Complaint  Patient presents with  . Hypertension    History of Present Illness:  Caitlin Wall is a 62 y.o. female with long-standing history of left bundle branch block (at least 2013), essential hypertension, prior smoker, hyperlipidemia, and recent chest discomfort. She is referred by Caitlin Wall after cardiac perfusion imaging returned abnormal.   Stable without any symptoms.  Back for blood pressure check after medication adjustment.   Past Medical History:  Diagnosis Date  . Asthma, persistent not controlled 07/27/2012  . Breast cancer ([place]) 12/28/14   Left BresastDCIS  . COLONIC POLYPS, HX OF 11/04/2007  . GERD 11/04/2007  . GERD (gastroesophageal reflux disease)   . HAIR LOSS 11/04/2007  . Hx of cardiac cath 11/15/2016   a. LHC 11/15/16 showed normal coronaries and EF 50-55% with normal EDP. (done for false positive NST)  . Hx of colonic polyps 1995   adenomatous  . Hyperlipidemia   . HYPERLIPIDEMIA 11/04/2007  . Hypertension   . HYPERTENSION 06/07/2009  . LBBB (left bundle branch block) 07/26/2011  . S/P radiation therapy 02/02/15-03/24/15   left breast 60.4GY    Past Surgical History:  Procedure Laterality Date  . ABDOMINAL HYSTERECTOMY    . ANTERIOR FUSION CERVICAL SPINE    . bilat ear surgury/hearing loss    . BREAST LUMPECTOMY WITH RADIOACTIVE SEED LOCALIZATION Left 12/28/2014   Procedure: BREAST LUMPECTOMY WITH RADIOACTIVE SEED LOCALIZATION;  Surgeon: Caitlin Messing III, MD;  Location: [facility];  Service: General;  Laterality: Left;  . LEFT HEART CATH AND CORONARY ANGIOGRAPHY N/A 11/15/2016   Procedure: LEFT HEART CATH AND CORONARY ANGIOGRAPHY;  Surgeon: Caitlin Crome, MD;  Location: [facility];  Service: Cardiovascular;  Laterality: N/A;    Current  Medications: Outpatient Medications Prior to Visit  Medication Sig Dispense Refill  . amLODipine (NORVASC) 5 MG tablet Take 5 mg by mouth daily.    Marland Kitchen anastrozole (ARIMIDEX) 1 MG tablet TAKE ONE TABLET BY MOUTH ONCE DAILY 90 tablet 4  . aspirin EC 81 MG tablet Take 81 mg by mouth daily.    Marland Kitchen ibuprofen (ADVIL,MOTRIN) 200 MG tablet Take 200 mg by mouth every 8 (eight) hours as needed (for pain.).     Marland Kitchen losartan (COZAAR) 100 MG tablet Take 1 tablet (100 mg total) by mouth daily. 90 tablet 3  . Multiple Vitamin (MULTIVITAMIN WITH MINERALS) TABS tablet Take 1 tablet by mouth daily.    . ranitidine (ZANTAC) 150 MG tablet Take 1 tablet (150 mg total) by mouth daily. 150 MG DAILY FOR 1 MONTH THEN STOP 30 tablet 0  . amLODipine (NORVASC) 5 MG tablet Take 1 tablet (5 mg total) by mouth daily. 180 tablet 3   No facility-administered medications prior to visit.      Allergies:   Codeine   Social History   Socioeconomic History  . Marital status: Married    Spouse name: None  . Number of children: 3  . Years of education: None  . Highest education level: None  Social Needs  . Financial resource strain: None  . Food insecurity - worry: None  . Food insecurity - inability: None  . Transportation needs - medical: None  . Transportation needs - non-medical: None  Occupational History  . Occupation:  accounts receivable    Employer: [employer]  Tobacco Use  . Smoking status: Former Smoker    Packs/day: 1.00    Years: 20.00    Pack years: 20.00    Types: Cigarettes    Last attempt to quit: 08/19/1989    Years since quitting: 27.6  . Smokeless tobacco: Never Used  Substance and Sexual Activity  . Alcohol use: No  . Drug use: No  . Sexual activity: None  Other Topics Concern  . None  Social History Narrative  . None     Family History:  The patient's family history includes Breast cancer in her maternal grandmother and other; Cancer in her paternal grandmother; Healthy in her  mother; Lung cancer in her father.   ROS:   Please see the history of present illness.    None  All other systems reviewed and are negative.   PHYSICAL EXAM:   VS:  BP 116/78   Pulse 92   Ht 5\' 5"  (1.651 m)   Wt 199 lb 12.8 oz (90.6 kg)   SpO2 95%   BMI 33.25 kg/m    GEN: Well nourished, well developed, in no acute distress  HEENT: normal  Neck: no JVD, carotid bruits, or masses Cardiac: RRR; no murmurs, rubs, or gallops,no edema  Respiratory:  clear to auscultation bilaterally, normal work of breathing GI: soft, nontender, nondistended, + BS MS: no deformity or atrophy  Skin: warm and dry, no rash Neuro:  Alert and Oriented x 3, Strength and sensation are intact Psych: euthymic mood, full affect  Wt Readings from Last 3 Encounters:  03/28/17 199 lb 12.8 oz (90.6 kg)  12/04/16 199 lb 12.8 oz (90.6 kg)  11/15/16 195 lb (88.5 kg)      Studies/Labs Reviewed:   EKG:  EKG not repeated  Recent Labs: 10/23/2016: ALT 15; TSH 1.96 11/12/2016: BUN 10; Creatinine, Ser 0.77; Hemoglobin 13.7; Platelets 231; Potassium 4.4; Sodium 148   Lipid Panel    Component Value Date/Time   CHOL 161 10/23/2016 0758   TRIG 110.0 10/23/2016 0758   HDL 37.60 (L) 10/23/2016 0758   CHOLHDL 4 10/23/2016 0758   VLDL 22.0 10/23/2016 0758   LDLCALC 101 (H) 10/23/2016 0758    Additional studies/ records that were reviewed today include:  Cardiac catheterization 11/15/16:  Conclusion    Normal coronary arteries. Left dominant coronary circulation.  Normal left ventricular function. EF 50-55%. Normal EDP.   RECOMMENDATIONS:   No further cardiac workup for chest discomfort. Musculoskeletal and/or GI are most likely explanations.   Myocardial perfusion imaging is false positive.     ASSESSMENT:    1. Essential hypertension   2. LBBB (left bundle branch block)      PLAN:  In order of problems listed above:  1. Excellent blood pressure control.  Continue current medical regimen.   Follow-up with Caitlin Wall. 2. Asymptomatic and not related to ischemia/coronary disease.  Follow-up as needed.  Reassured the patient that no actionable coronary or heart muscle problem exists.    Medication Adjustments/Labs and Tests Ordered: Current medicines are reviewed at length with the patient today.  Concerns regarding medicines are outlined above.  Medication changes, Labs and Tests ordered today are listed in the Patient Instructions below. There are no Patient Instructions on file for this visit.   Signed, Caitlin Grooms, MD  03/28/2017 9:42 AM    [facility] [address], [place], [place]  [address] Phone: [phone]; Fax: [phone]  

## 2017-03-28 ENCOUNTER — Ambulatory Visit (INDEPENDENT_AMBULATORY_CARE_PROVIDER_SITE_OTHER): Payer: 59 | Admitting: Interventional Cardiology

## 2017-03-28 ENCOUNTER — Encounter: Payer: Self-pay | Admitting: Interventional Cardiology

## 2017-03-28 VITALS — BP 116/78 | HR 92 | Ht 65.0 in | Wt 199.8 lb

## 2017-03-28 DIAGNOSIS — I10 Essential (primary) hypertension: Secondary | ICD-10-CM

## 2017-03-28 DIAGNOSIS — I447 Left bundle-branch block, unspecified: Secondary | ICD-10-CM | POA: Diagnosis not present

## 2017-03-28 NOTE — Patient Instructions (Signed)
Medication Instructions:  Your physician recommends that you continue on your current medications as directed. Please refer to the Current Medication list given to you today.  Labwork: None  Testing/Procedures: None  Follow-Up: Your physician recommends that you schedule a follow-up appointment as needed with Dr. Smith.     Any Other Special Instructions Will Be Listed Below (If Applicable).     If you need a refill on your cardiac medications before your next appointment, please call your pharmacy.   

## 2017-04-17 ENCOUNTER — Other Ambulatory Visit: Payer: BLUE CROSS/BLUE SHIELD

## 2017-04-17 ENCOUNTER — Ambulatory Visit: Payer: BLUE CROSS/BLUE SHIELD | Admitting: Oncology

## 2017-04-25 ENCOUNTER — Encounter: Payer: Self-pay | Admitting: Physician Assistant

## 2017-04-25 ENCOUNTER — Ambulatory Visit (INDEPENDENT_AMBULATORY_CARE_PROVIDER_SITE_OTHER): Payer: 59 | Admitting: Physician Assistant

## 2017-04-25 ENCOUNTER — Other Ambulatory Visit: Payer: Self-pay | Admitting: *Deleted

## 2017-04-25 ENCOUNTER — Other Ambulatory Visit: Payer: Self-pay

## 2017-04-25 VITALS — BP 93/58 | HR 101 | Temp 98.2°F | Resp 18

## 2017-04-25 DIAGNOSIS — Z87891 Personal history of nicotine dependence: Secondary | ICD-10-CM

## 2017-04-25 DIAGNOSIS — R059 Cough, unspecified: Secondary | ICD-10-CM

## 2017-04-25 DIAGNOSIS — C50512 Malignant neoplasm of lower-outer quadrant of left female breast: Secondary | ICD-10-CM

## 2017-04-25 DIAGNOSIS — E860 Dehydration: Secondary | ICD-10-CM

## 2017-04-25 DIAGNOSIS — R05 Cough: Secondary | ICD-10-CM

## 2017-04-25 DIAGNOSIS — Z170 Estrogen receptor positive status [ER+]: Principal | ICD-10-CM

## 2017-04-25 LAB — BASIC METABOLIC PANEL
BUN/Creatinine Ratio: 15 (ref 12–28)
BUN: 18 mg/dL (ref 8–27)
CO2: 24 mmol/L (ref 20–29)
CREATININE: 1.2 mg/dL — AB (ref 0.57–1.00)
Calcium: 9.8 mg/dL (ref 8.7–10.3)
Chloride: 101 mmol/L (ref 96–106)
GFR, EST AFRICAN AMERICAN: 57 mL/min/{1.73_m2} — AB (ref >59)
GFR, EST NON AFRICAN AMERICAN: 49 mL/min/{1.73_m2} — AB (ref >59)
Glucose: 104 mg/dL — ABNORMAL HIGH (ref 65–99)
Potassium: 4.1 mmol/L (ref 3.5–5.2)
Sodium: 141 mmol/L (ref 134–144)

## 2017-04-25 LAB — POCT CBC
Granulocyte percent: 83 %G — AB (ref 37–80)
HCT, POC: 42.1 % (ref 37.7–47.9)
Hemoglobin: 13.8 g/dL (ref 12.2–16.2)
Lymph, poc: 1.5 (ref 0.6–3.4)
MCH: 39.6 pg — AB (ref 27–31.2)
MCHC: 32.8 g/dL (ref 31.8–35.4)
MCV: 90.2 fL (ref 80–97)
MID (cbc): 1 — AB (ref 0–0.9)
MPV: 7.6 fL (ref 0–99.8)
PLATELET COUNT, POC: 282 10*3/uL (ref 142–424)
POC Granulocyte: 12.2 — AB (ref 2–6.9)
POC LYMPH PERCENT: 10.2 %L (ref 10–50)
POC MID %: 6.8 % (ref 0–12)
RBC: 4.67 M/uL (ref 4.04–5.48)
RDW, POC: 12.2 %
WBC: 14.7 10*3/uL — AB (ref 4.6–10.2)

## 2017-04-25 LAB — POCT INFLUENZA A/B
INFLUENZA B, POC: NEGATIVE
Influenza A, POC: NEGATIVE

## 2017-04-25 MED ORDER — BENZONATATE 200 MG PO CAPS: 200.0000 mg | ORAL_CAPSULE | Freq: Two times a day (BID) | ORAL | 0 refills | Status: DC | PRN

## 2017-04-25 MED ORDER — DOXYCYCLINE HYCLATE 100 MG PO CAPS: 100.0000 mg | ORAL_CAPSULE | Freq: Two times a day (BID) | ORAL | 0 refills | Status: AC

## 2017-04-25 NOTE — Progress Notes (Signed)
Caitlin Wall  [phone] [phone]     ID: Caitlin Wall DOB: 18-Jun-1955  MR#: [id number]  UVO#:[id number]  Patient Care Team: Caitlin Wall as PCP - General Caitlin Julian Lynnell Dike, Wall as PCP - Cardiology (Cardiology) Caitlin Wall, Caitlin Dad, Wall as Consulting Physician (Oncology) Caitlin Rudd, Wall as Consulting Physician (Radiation Oncology) Caitlin Kussmaul, Wall as Consulting Physician (General Surgery) Caitlin Cheese, NP as Nurse Practitioner (Hematology and Oncology) PCP: Caitlin Wall SU: Star Age Wall OTHER Wall: Caitlin Barre Wall, Caitlin Rudd Wall  CHIEF COMPLAINT: Ductal carcinoma in situ  CURRENT TREATMENT: Anastrozole   BREAST CANCER HISTORY:  From the original intake note:   Caitlin Wall [facility]. She has subpectoral saline implants in place. There was a possible asymmetry in the left breast and she was recalled for left diagnostic mammography with tomosynthesis and left breast ultrasonography 11/03/2014. The breast density was category C. In the left breast lower outer quadrant there was an irregular mass which was not palpable and which by ultrasonography measured 0.6 cm. The left axilla was sonographically benign. Biopsy of this mass 11/09/2014 showed ([id number]) fibroadipose adipose tissue. This was felt to be discordant.  Accordingly the patient was referred to surgery and she underwent left breast radioactive seed localized lumpectomy 12/28/2014. The pathology from that procedure (([id number]) showed ductal carcinoma in situ, high-grade, measuring 0.2 cm. This was less than 0.1 cm from the posterior margin. The cells was estrogen receptor positive at 95%, with strong staining intensity, progesterone receptor positive at 30% with moderate staining intensity.  Her subsequent history is as detailed below.   INTERVAL HISTORY: Caitlin Wall returns today for follow up and treatment of her  estrogen receptor positive breast cancer as well as her history of DVT. She continues on anastrozole, with good tolerance. She denies issues with hot flashes. She has some vaginal dryness, but this is not a major issue for her.  She was supposed to have had mammography January 2019. She notes that she would like to schedule that soon  REVIEW OF SYSTEMS: Caitlin Wall reports that she has an irritating dry cough that started a week ago. She denies having a fever. She visited her PCP and was presrcibed cough medicine and antibiotics. She took it easy over the last week, but she usually walks for exercise. She also continues her daily activities at work. She denies unusual headaches, visual changes, nausea, vomiting, or dizziness. There has been no unusual cough, phlegm production, or pleurisy. This been no change in bowel or bladder habits. She denies unexplained fatigue or unexplained weight loss, bleeding, rash, or fever. A detailed review of systems was otherwise stable.    PAST MEDICAL HISTORY: Past Medical History:  Diagnosis Date  . Asthma, persistent not controlled 07/27/2012  . Breast cancer ([place]) 12/28/14   Left BresastDCIS  . COLONIC POLYPS, HX OF 11/04/2007  . GERD 11/04/2007  . GERD (gastroesophageal reflux disease)   . HAIR LOSS 11/04/2007  . Hx of cardiac cath 11/15/2016   a. LHC 11/15/16 showed normal coronaries and EF 50-55% with normal EDP. (done for false positive NST)  . Hx of colonic polyps 1995   adenomatous  . Hyperlipidemia   . HYPERLIPIDEMIA 11/04/2007  . Hypertension   . HYPERTENSION 06/07/2009  . LBBB (left bundle branch block) 07/26/2011  . S/P radiation therapy 02/02/15-03/24/15   left breast 60.4GY    PAST SURGICAL HISTORY: Past Surgical  History:  Procedure Laterality Date  . ABDOMINAL HYSTERECTOMY    . ANTERIOR FUSION CERVICAL SPINE    . bilat ear surgury/hearing loss    . BREAST LUMPECTOMY WITH RADIOACTIVE SEED LOCALIZATION Left 12/28/2014   Procedure: BREAST LUMPECTOMY WITH  RADIOACTIVE SEED LOCALIZATION;  Surgeon: Caitlin Messing III, Wall;  Location: [facility];  Service: General;  Laterality: Left;  . LEFT HEART CATH AND CORONARY ANGIOGRAPHY N/A 11/15/2016   Procedure: LEFT HEART CATH AND CORONARY ANGIOGRAPHY;  Surgeon: Belva Crome, Wall;  Location: [facility];  Service: Cardiovascular;  Laterality: N/A;    FAMILY HISTORY Family History  Problem Relation Age of Onset  . Breast cancer Other   . Cancer Paternal Grandmother        breast  . Lung cancer Father   . Breast cancer Maternal Grandmother   . Healthy Mother   . Coronary artery disease Neg Hx   . Colon cancer Neg Hx   . Pancreatic cancer Neg Hx   . Rectal cancer Neg Hx   . Stomach cancer Neg Hx    the patient's father died from lung cancer in the setting of tobacco abuse at age 63. [name] died from "old age" at age 21. The patient had one brother, 3 sisters. The brother had" I cancer" requiring enucleation--the patient does not know whether this was a primary ocular melanoma. [name] was diagnosed with breast cancer but the patient does not know at what age. There is no other history of breast or ovarian cancer in the family  GYNECOLOGIC HISTORY:  No LMP recorded. Patient has had a hysterectomy. Menarche age 41, first live birth age 70. The patient is GX P3. She is status post hysterectomy without salpingo-oophorectomy. She did not take hormone replacement. She did use oral contraceptives remotely without any complications.  SOCIAL HISTORY:  Caitlin Wall works in accounts payable. Her husband [name] is disabled because of back problems. [name] is a Marine scientist working in rehabilitation in [place]. [name] lives in [place] where he works as a Games developer. [name] lives in [place] where he works as a Emergency planning/management officer. The patient has 4 grandchildren. She is not a Ambulance person.    ADVANCED DIRECTIVES: Not in place   HEALTH  MAINTENANCE: Social History   Tobacco Use  . Smoking status: Former Smoker    Packs/day: 1.00    Years: 20.00    Pack years: 20.00    Types: Cigarettes    Last attempt to quit: 08/19/1989    Years since quitting: 27.7  . Smokeless tobacco: Never Used  Substance Use Topics  . Alcohol use: No  . Drug use: No     Colonoscopy:  PAP:  Bone density:  Lipid panel:  Allergies  Allergen Reactions  . Codeine     REACTION: nausea,    Current Outpatient Medications  Medication Sig Dispense Refill  . amLODipine (NORVASC) 5 MG tablet Take 5 mg by mouth daily.    Marland Kitchen anastrozole (ARIMIDEX) 1 MG tablet TAKE ONE TABLET BY MOUTH ONCE DAILY 90 tablet 4  . aspirin EC 81 MG tablet Take 81 mg by mouth daily.    . benzonatate (TESSALON) 200 MG capsule Take 1 capsule (200 mg total) by mouth 2 (two) times daily as needed for cough. 20 capsule 0  . doxycycline (VIBRAMYCIN) 100 MG capsule Take 1 capsule (100 mg total) by mouth 2 (two) times daily for 10 days. 20 capsule 0  .  ibuprofen (ADVIL,MOTRIN) 200 MG tablet Take 200 mg by mouth every 8 (eight) hours as needed (for pain.).     Marland Kitchen losartan (COZAAR) 100 MG tablet Take 1 tablet (100 mg total) by mouth daily. 90 tablet 3  . Multiple Vitamin (MULTIVITAMIN WITH MINERALS) TABS tablet Take 1 tablet by mouth daily.     No current facility-administered medications for this visit.     OBJECTIVE: Middle-aged White woman who appears well Vitals:   04/28/17 1519  BP: 105/65  Pulse: 96  Resp: 18  Temp: 98.3 F (36.8 C)  SpO2: 96%     Body mass index is 32.22 kg/m.    ECOG FS:1 - Symptomatic but completely ambulatory  Sclerae unicteric, pupils round and equal Oropharynx clear and moist No cervical or supraclavicular adenopathy Lungs no rales or rhonchi, mild cough on deep inspiration, dry Heart regular rate and rhythm Abd soft, nontender, positive bowel sounds MSK no focal spinal tenderness, no upper extremity lymphedema Neuro: nonfocal, well  oriented, appropriate affect Breasts: She has bilateral nipple studs in place.  The right breast is unremarkable.  The left breast has undergone lumpectomy followed by radiation.  There is no evidence of disease recurrence.  Both axillae are benign.   LAB RESULTS:  CMP     Component Value Date/Time   NA 141 04/25/2017 1213   NA 145 07/04/2016 1133   K 4.1 04/25/2017 1213   K 4.2 07/04/2016 1133   CL 101 04/25/2017 1213   CO2 24 04/25/2017 1213   CO2 28 07/04/2016 1133   GLUCOSE 104 (H) 04/25/2017 1213   GLUCOSE 95 10/23/2016 0758   GLUCOSE 101 07/04/2016 1133   BUN 18 04/25/2017 1213   BUN 16.0 07/04/2016 1133   CREATININE 1.20 (H) 04/25/2017 1213   CREATININE 0.9 07/04/2016 1133   CALCIUM 9.8 04/25/2017 1213   CALCIUM 9.9 07/04/2016 1133   PROT 6.8 10/23/2016 0758   PROT 7.3 07/04/2016 1133   ALBUMIN 3.9 10/23/2016 0758   ALBUMIN 3.8 07/04/2016 1133   AST 20 10/23/2016 0758   AST 19 07/04/2016 1133   ALT 15 10/23/2016 0758   ALT 17 07/04/2016 1133   ALKPHOS 116 10/23/2016 0758   ALKPHOS 172 (H) 07/04/2016 1133   BILITOT 0.7 10/23/2016 0758   BILITOT 0.94 07/04/2016 1133   GFRNONAA 49 (L) 04/25/2017 1213   GFRAA 57 (L) 04/25/2017 1213    INo results found for: SPEP, UPEP  Lab Results  Component Value Date   WBC 8.8 04/28/2017   NEUTROABS 5.5 04/28/2017   HGB 13.8 04/25/2017   HCT 40.1 04/28/2017   MCV 90.5 04/28/2017   PLT 314 04/28/2017      Chemistry      Component Value Date/Time   NA 141 04/25/2017 1213   NA 145 07/04/2016 1133   K 4.1 04/25/2017 1213   K 4.2 07/04/2016 1133   CL 101 04/25/2017 1213   CO2 24 04/25/2017 1213   CO2 28 07/04/2016 1133   BUN 18 04/25/2017 1213   BUN 16.0 07/04/2016 1133   CREATININE 1.20 (H) 04/25/2017 1213   CREATININE 0.9 07/04/2016 1133      Component Value Date/Time   CALCIUM 9.8 04/25/2017 1213   CALCIUM 9.9 07/04/2016 1133   ALKPHOS 116 10/23/2016 0758   ALKPHOS 172 (H) 07/04/2016 1133   AST 20 10/23/2016  0758   AST 19 07/04/2016 1133   ALT 15 10/23/2016 0758   ALT 17 07/04/2016 1133   BILITOT 0.7 10/23/2016 0758  BILITOT 0.94 07/04/2016 1133       No results found for: LABCA2  No components found for: LABCA125  No results for input(s): INR in the last 168 hours.  Urinalysis    Component Value Date/Time   COLORURINE YELLOW 10/23/2016 0758   APPEARANCEUR CLEAR 10/23/2016 0758   LABSPEC 1.015 10/23/2016 0758   PHURINE 6.0 10/23/2016 0758   GLUCOSEU NEGATIVE 10/23/2016 0758   HGBUR NEGATIVE 10/23/2016 0758   BILIRUBINUR NEGATIVE 10/23/2016 0758   KETONESUR NEGATIVE 10/23/2016 0758   PROTEINUR NEGATIVE 06/03/2016 1805   UROBILINOGEN 1.0 10/23/2016 0758   NITRITE NEGATIVE 10/23/2016 0758   LEUKOCYTESUR NEGATIVE 10/23/2016 0758    STUDIES: Mammography overdue, to be done later this month   ASSESSMENT: 62 y.o. [place] woman status post left lumpectomy 12/28/2014 for ductal carcinoma in situ, high-grade measuring 0.2 cm, estrogen and progesterone receptor positive, with close but negative margins  (1) adjuvant radiation completed 03/24/2015  (2) anastrozole started 04/19/2015   (a) bone density 03/01/2016 shows a T score of -1.2  (3) mildly abnormal hepatic function panel: negative workup for hepatitis B and C, normal alpha-1-anti-trypsin  (4) left lower extremity DVT diagnosed by Doppler ultrasonography 04/16/2016, with negative CT angiogram chest  (a) on Xarelto starting 04/16/2016   (b) negative hypercoagulable panel, with a minimally abnormal (indeterminate) anti-cardiolipin IgM  ((c) stopped rivaroxaban 07/16/2016, repeat D-Dimer WNL August 2018  PLAN Caitlin Wall is now just over 2 years out from definitive surgery for her breast cancer with no evidence of disease recurrence.  This is very favorable.  She is tolerating anastrozole well and the plan will be to continue that through January 2022.  She will be due for repeat DEXA scan next year  She is a little  bit behind on her mammography and I have gone ahead and placed the order for her to have it done next week  I encouraged her to continue her walking program.  She knows to call for any issues that may develop before her next visit.    Caitlin Wall, Caitlin Dad, Wall  04/28/17 3:32 PM Medical Oncology and Hematology [facility] [address] [place], [place] [address] Tel. [phone]    Fax. [phone]  This document serves as a record of services personally performed by Lurline Del, Wall. It was created on his behalf by Sheron Nightingale, a trained medical scribe. The creation of this record is based on the scribe's personal observations and the provider's statements to them.   I have reviewed the above documentation for accuracy and completeness, and I agree with the above.

## 2017-04-25 NOTE — Patient Instructions (Addendum)
Go ahead and start the antibiotic today.  Make sure that you eat something when you take the antibiotic to prevent nausea.  I like you taking Tylenol 1000 mg every 8 hours and I have prescribed you some cough drops.  Please make sure that she go home and drink fluids.  I would like for you to aim for at least 1/2-3/4 gallon today.  I would like to see you back tomorrow if you are not feeling better.  And I would like to see you back on Monday regardless of how you are feeling.    IF you received an x-ray today, you will receive an invoice from [facility]. Please contact [facility] at [phone] with questions or concerns regarding your invoice.   IF you received labwork today, you will receive an invoice from [facility]. Please contact [facility] at [phone] with questions or concerns regarding your invoice.   Our billing staff will not be able to assist you with questions regarding bills from these companies.  You will be contacted with the lab results as soon as they are available. The fastest way to get your results is to activate your My Chart account. Instructions are located on the last page of this paperwork. If you have not heard from [place] regarding the results in 2 weeks, please contact this office.

## 2017-04-25 NOTE — Progress Notes (Signed)
04/25/2017 2:22 PM   DOB: 30-Jul-1955 / MRN: [id number]  SUBJECTIVE:  Caitlin Wall is a well-appearing 62 y.o. female presenting for cough.  Associates nasal congestion.  Denies fever.  Symptoms present now for about 4 days and she feels she is getting worse.  She has a distant 20-25-pack-year history of smoking.  She denies shortness of breath, chest pain, dizziness, dizziness with standing.  Her last urination was this morning she tells that it was dark and scant.  Her husband says she is not been eating for the last 2-3 days.  She is allergic to codeine.   She  has a past medical history of Asthma, persistent not controlled (07/27/2012), Breast cancer ([place]) (12/28/14), COLONIC POLYPS, HX OF (11/04/2007), GERD (11/04/2007), GERD (gastroesophageal reflux disease), HAIR LOSS (11/04/2007), cardiac cath (11/15/2016), colonic polyps (1995), Hyperlipidemia, HYPERLIPIDEMIA (11/04/2007), Hypertension, HYPERTENSION (06/07/2009), LBBB (left bundle branch block) (07/26/2011), and S/P radiation therapy (02/02/15-03/24/15).    She  reports that she quit smoking about 27 years ago. Her smoking use included cigarettes. She has a 20.00 pack-year smoking history. she has never used smokeless tobacco. She reports that she does not drink alcohol or use drugs. She  has no sexual activity history on file. The patient  has a past surgical history that includes bilat ear surgury/hearing loss; Abdominal hysterectomy; Anterior fusion cervical spine; Breast lumpectomy with radioactive seed localization (Left, 12/28/2014); and LEFT HEART CATH AND CORONARY ANGIOGRAPHY (N/A, 11/15/2016).  Her family history includes Breast cancer in her maternal grandmother and other; Cancer in her paternal grandmother; Healthy in her mother; Lung cancer in her father.  Review of Systems  Constitutional: Negative for chills, diaphoresis and fever.  HENT: Positive for congestion.   Eyes: Negative.   Respiratory: Positive for cough and sputum  production. Negative for hemoptysis, shortness of breath and wheezing.   Cardiovascular: Negative for chest pain, orthopnea and leg swelling.  Gastrointestinal: Negative for abdominal pain, blood in stool, constipation, diarrhea, heartburn, melena, nausea and vomiting.  Genitourinary: Negative for flank pain.  Skin: Negative for rash.  Neurological: Negative for dizziness, sensory change, speech change, focal weakness and headaches.    The problem list and medications were reviewed and updated by myself where necessary and exist elsewhere in the encounter.   OBJECTIVE:  BP (!) 93/58 (BP Location: Left Arm, Patient Position: Sitting, Cuff Size: Large)   Pulse (!) 101   Temp 98.2 F (36.8 C) (Oral)   Resp 18   SpO2 96%     BP Readings from Last 3 Encounters:  04/25/17 (!) 93/58  03/28/17 116/78  12/04/16 (!) 158/82     Physical Exam  Constitutional: She is active.  Non-toxic appearance.  HENT:  Right Ear: External ear normal.  Left Ear: External ear normal.  Mouth/Throat: No oropharyngeal exudate.  Lips and mucous membranes are dry.  There is no pallor.  Cardiovascular: Normal rate.  Pulmonary/Chest: Effort normal. No tachypnea.  Neurological: She is alert.  Skin: Skin is warm and dry. She is not diaphoretic. No pallor.    Results for orders placed or performed in visit on 04/25/17 (from the past 72 hour(s))  POCT Influenza A/B     Status: Normal   Collection Time: 04/25/17 11:41 AM  Result Value Ref Range   Influenza A, POC Negative Negative   Influenza B, POC Negative Negative  POCT CBC     Status: Abnormal   Collection Time: 04/25/17 12:03 PM  Result Value Ref Range   WBC 14.7 (  A) 4.6 - 10.2 K/uL   Lymph, poc 1.5 0.6 - 3.4   POC LYMPH PERCENT 10.2 10 - 50 %L   MID (cbc) 1.0 (A) 0 - 0.9   POC MID % 6.8 0 - 12 %M   POC Granulocyte 12.2 (A) 2 - 6.9   Granulocyte percent 83.0 (A) 37 - 80 %G   RBC 4.67 4.04 - 5.48 M/uL   Hemoglobin 13.8 12.2 - 16.2 g/dL   HCT,  POC 42.1 37.7 - 47.9 %   MCV 90.2 80 - 97 fL   MCH, POC 39.6 (A) 27 - 31.2 pg   MCHC 32.8 31.8 - 35.4 g/dL   RDW, POC 12.2 %   Platelet Count, POC 282 142 - 424 K/uL   MPV 7.6 0 - 99.8 fL    No results found.  ASSESSMENT AND PLAN:  Ruhama was seen today for cough.  Diagnoses and all orders for this visit:  Cough: Patient with reassuring exam and appears to be dehydrated.  I will cover for community-acquired pneumonia and have advised that she drink copious fluids.  She has a codeine allergy otherwise I would have like to give her some Hycodan for her symptoms.  She denies dizziness with standing, chest pain, shortness of breath, leg swelling.  I will see her back tomorrow if she is not feeling better and I want to see her back on Monday regardless of how she is feeling. -     POCT Influenza A/B -     POCT CBC -     doxycycline (VIBRAMYCIN) 100 MG capsule; Take 1 capsule (100 mg total) by mouth 2 (two) times daily for 10 days. -     benzonatate (TESSALON) 200 MG capsule; Take 1 capsule (200 mg total) by mouth 2 (two) times daily as needed for cough. -     Basic Metabolic Panel  Stopped smoking with greater than 20 pack year history  Dehydration  Other orders -     Cancel: HYDROcodone-homatropine (HYCODAN) 5-1.5 MG/5ML syrup; Take 5 mLs by mouth every 8 (eight) hours as needed for cough.    The patient is advised to call or return to clinic if she does not see an improvement in symptoms, or to seek the care of the closest emergency department if she worsens with the above plan.   Philis Fendt, MHS, PA-C [facility] 04/25/2017 2:22 PM

## 2017-04-26 NOTE — Progress Notes (Signed)
Spoke with patient.  She is feeling better.  Scheduling:  Please cancel her Monday appointment with me and open that spot back up. Philis Fendt, MS, PA-C 1:38 PM, 04/26/2017

## 2017-04-28 ENCOUNTER — Inpatient Hospital Stay (HOSPITAL_BASED_OUTPATIENT_CLINIC_OR_DEPARTMENT_OTHER): Payer: 59 | Admitting: Oncology

## 2017-04-28 ENCOUNTER — Inpatient Hospital Stay: Payer: 59 | Attending: Oncology

## 2017-04-28 ENCOUNTER — Telehealth: Payer: Self-pay | Admitting: Oncology

## 2017-04-28 VITALS — BP 105/65 | HR 96 | Temp 98.3°F | Resp 18 | Ht 65.0 in | Wt 193.6 lb

## 2017-04-28 DIAGNOSIS — Z803 Family history of malignant neoplasm of breast: Secondary | ICD-10-CM

## 2017-04-28 DIAGNOSIS — Z170 Estrogen receptor positive status [ER+]: Secondary | ICD-10-CM | POA: Insufficient documentation

## 2017-04-28 DIAGNOSIS — Z86718 Personal history of other venous thrombosis and embolism: Secondary | ICD-10-CM | POA: Diagnosis not present

## 2017-04-28 DIAGNOSIS — D0512 Intraductal carcinoma in situ of left breast: Secondary | ICD-10-CM | POA: Insufficient documentation

## 2017-04-28 DIAGNOSIS — Z7901 Long term (current) use of anticoagulants: Secondary | ICD-10-CM | POA: Insufficient documentation

## 2017-04-28 DIAGNOSIS — Z7982 Long term (current) use of aspirin: Secondary | ICD-10-CM | POA: Insufficient documentation

## 2017-04-28 DIAGNOSIS — I824Z2 Acute embolism and thrombosis of unspecified deep veins of left distal lower extremity: Secondary | ICD-10-CM

## 2017-04-28 DIAGNOSIS — Z79899 Other long term (current) drug therapy: Secondary | ICD-10-CM | POA: Diagnosis not present

## 2017-04-28 DIAGNOSIS — C50512 Malignant neoplasm of lower-outer quadrant of left female breast: Secondary | ICD-10-CM | POA: Diagnosis not present

## 2017-04-28 DIAGNOSIS — Z87891 Personal history of nicotine dependence: Secondary | ICD-10-CM | POA: Diagnosis not present

## 2017-04-28 DIAGNOSIS — Z801 Family history of malignant neoplasm of trachea, bronchus and lung: Secondary | ICD-10-CM | POA: Diagnosis not present

## 2017-04-28 DIAGNOSIS — Z79811 Long term (current) use of aromatase inhibitors: Secondary | ICD-10-CM | POA: Insufficient documentation

## 2017-04-28 LAB — CBC WITH DIFFERENTIAL (CANCER CENTER ONLY)
BASOS ABS: 0 10*3/uL (ref 0.0–0.1)
Basophils Relative: 1 %
EOS PCT: 5 %
Eosinophils Absolute: 0.4 10*3/uL (ref 0.0–0.5)
HCT: 40.1 % (ref 34.8–46.6)
Hemoglobin: 13.4 g/dL (ref 11.6–15.9)
LYMPHS PCT: 19 %
Lymphs Abs: 1.7 10*3/uL (ref 0.9–3.3)
MCH: 30.2 pg (ref 25.1–34.0)
MCHC: 33.4 g/dL (ref 31.5–36.0)
MCV: 90.5 fL (ref 79.5–101.0)
MONO ABS: 1.3 10*3/uL — AB (ref 0.1–0.9)
Monocytes Relative: 14 %
Neutro Abs: 5.5 10*3/uL (ref 1.5–6.5)
Neutrophils Relative %: 61 %
PLATELETS: 314 10*3/uL (ref 145–400)
RBC: 4.43 MIL/uL (ref 3.70–5.45)
RDW: 12.1 % (ref 11.2–14.5)
WBC Count: 8.8 10*3/uL (ref 3.9–10.3)

## 2017-04-28 LAB — CMP (CANCER CENTER ONLY)
ALBUMIN: 3.1 g/dL — AB (ref 3.5–5.0)
ALK PHOS: 154 U/L — AB (ref 40–150)
ALT: 41 U/L (ref 0–55)
AST: 27 U/L (ref 5–34)
Anion gap: 11 (ref 3–11)
BILIRUBIN TOTAL: 0.7 mg/dL (ref 0.2–1.2)
BUN: 20 mg/dL (ref 7–26)
CALCIUM: 10.3 mg/dL (ref 8.4–10.4)
CO2: 25 mmol/L (ref 22–29)
Chloride: 106 mmol/L (ref 98–109)
Creatinine: 1.53 mg/dL — ABNORMAL HIGH (ref 0.60–1.10)
GFR, Est AFR Am: 42 mL/min — ABNORMAL LOW (ref >60)
GFR, Estimated: 36 mL/min — ABNORMAL LOW (ref >60)
GLUCOSE: 97 mg/dL (ref 70–140)
POTASSIUM: 3.6 mmol/L (ref 3.5–5.1)
Sodium: 142 mmol/L (ref 136–145)
TOTAL PROTEIN: 7.6 g/dL (ref 6.4–8.3)

## 2017-04-28 MED ORDER — ANASTROZOLE 1 MG PO TABS: 1.0000 mg | ORAL_TABLET | Freq: Every day | ORAL | 4 refills | Status: DC

## 2017-04-28 NOTE — Progress Notes (Signed)
There was no appt that I see that waas either scheduled or cancelled so we should be good. Thanks - Lavella Lemons

## 2017-04-28 NOTE — Telephone Encounter (Signed)
Gave patient AVs and calendar of upcoming march 2020 appointments.  °

## 2017-04-29 ENCOUNTER — Encounter: Payer: Self-pay | Admitting: Oncology

## 2017-04-29 ENCOUNTER — Other Ambulatory Visit: Payer: Self-pay | Admitting: Oncology

## 2017-05-11 ENCOUNTER — Other Ambulatory Visit: Payer: Self-pay

## 2017-05-11 ENCOUNTER — Encounter (HOSPITAL_COMMUNITY): Payer: Self-pay | Admitting: Emergency Medicine

## 2017-05-11 DIAGNOSIS — Z86718 Personal history of other venous thrombosis and embolism: Secondary | ICD-10-CM | POA: Diagnosis not present

## 2017-05-11 DIAGNOSIS — Z853 Personal history of malignant neoplasm of breast: Secondary | ICD-10-CM | POA: Insufficient documentation

## 2017-05-11 DIAGNOSIS — R109 Unspecified abdominal pain: Secondary | ICD-10-CM | POA: Diagnosis not present

## 2017-05-11 DIAGNOSIS — R110 Nausea: Secondary | ICD-10-CM | POA: Insufficient documentation

## 2017-05-11 DIAGNOSIS — Z79899 Other long term (current) drug therapy: Secondary | ICD-10-CM | POA: Diagnosis not present

## 2017-05-11 DIAGNOSIS — R1030 Lower abdominal pain, unspecified: Secondary | ICD-10-CM | POA: Diagnosis not present

## 2017-05-11 DIAGNOSIS — R1032 Left lower quadrant pain: Secondary | ICD-10-CM | POA: Insufficient documentation

## 2017-05-11 DIAGNOSIS — R1033 Periumbilical pain: Secondary | ICD-10-CM | POA: Insufficient documentation

## 2017-05-11 DIAGNOSIS — K625 Hemorrhage of anus and rectum: Secondary | ICD-10-CM | POA: Diagnosis present

## 2017-05-11 DIAGNOSIS — K529 Noninfective gastroenteritis and colitis, unspecified: Secondary | ICD-10-CM | POA: Diagnosis not present

## 2017-05-11 DIAGNOSIS — I10 Essential (primary) hypertension: Secondary | ICD-10-CM | POA: Diagnosis not present

## 2017-05-11 DIAGNOSIS — K5289 Other specified noninfective gastroenteritis and colitis: Secondary | ICD-10-CM | POA: Diagnosis not present

## 2017-05-11 DIAGNOSIS — Z87891 Personal history of nicotine dependence: Secondary | ICD-10-CM | POA: Insufficient documentation

## 2017-05-11 LAB — CBC
HCT: 39.7 % (ref 36.0–46.0)
Hemoglobin: 13.1 g/dL (ref 12.0–15.0)
MCH: 30.2 pg (ref 26.0–34.0)
MCHC: 33 g/dL (ref 30.0–36.0)
MCV: 91.5 fL (ref 78.0–100.0)
PLATELETS: 241 10*3/uL (ref 150–400)
RBC: 4.34 MIL/uL (ref 3.87–5.11)
RDW: 12.5 % (ref 11.5–15.5)
WBC: 11.4 10*3/uL — AB (ref 4.0–10.5)

## 2017-05-11 LAB — COMPREHENSIVE METABOLIC PANEL
ALT: 24 U/L (ref 14–54)
AST: 36 U/L (ref 15–41)
Albumin: 3.6 g/dL (ref 3.5–5.0)
Alkaline Phosphatase: 119 U/L (ref 38–126)
Anion gap: 9 (ref 5–15)
BILIRUBIN TOTAL: 0.9 mg/dL (ref 0.3–1.2)
BUN: 15 mg/dL (ref 6–20)
CHLORIDE: 106 mmol/L (ref 101–111)
CO2: 25 mmol/L (ref 22–32)
CREATININE: 1.15 mg/dL — AB (ref 0.44–1.00)
Calcium: 9.3 mg/dL (ref 8.9–10.3)
GFR calc Af Amer: 59 mL/min — ABNORMAL LOW (ref >60)
GFR, EST NON AFRICAN AMERICAN: 51 mL/min — AB (ref >60)
GLUCOSE: 120 mg/dL — AB (ref 65–99)
Potassium: 3.6 mmol/L (ref 3.5–5.1)
Sodium: 140 mmol/L (ref 135–145)
TOTAL PROTEIN: 7 g/dL (ref 6.5–8.1)

## 2017-05-11 LAB — TYPE AND SCREEN
ABO/RH(D): O POS
Antibody Screen: NEGATIVE

## 2017-05-11 LAB — LIPASE, BLOOD: LIPASE: 39 U/L (ref 11–51)

## 2017-05-11 NOTE — ED Triage Notes (Signed)
Patient complaining of rectal bleeding that started this morning. Patient is complaining of lower abdominal pain also.

## 2017-05-12 ENCOUNTER — Emergency Department (HOSPITAL_COMMUNITY): Payer: 59

## 2017-05-12 ENCOUNTER — Emergency Department (HOSPITAL_COMMUNITY)
Admission: EM | Admit: 2017-05-12 | Discharge: 2017-05-12 | Disposition: A | Discharge status: Home / Self Care | Payer: 59 | Attending: Emergency Medicine | Admitting: Emergency Medicine

## 2017-05-12 ENCOUNTER — Encounter (HOSPITAL_COMMUNITY): Payer: Self-pay

## 2017-05-12 DIAGNOSIS — K529 Noninfective gastroenteritis and colitis, unspecified: Secondary | ICD-10-CM

## 2017-05-12 DIAGNOSIS — K5289 Other specified noninfective gastroenteritis and colitis: Secondary | ICD-10-CM | POA: Diagnosis not present

## 2017-05-12 DIAGNOSIS — R109 Unspecified abdominal pain: Secondary | ICD-10-CM | POA: Diagnosis not present

## 2017-05-12 LAB — URINALYSIS, ROUTINE W REFLEX MICROSCOPIC
Bacteria, UA: NONE SEEN
Bilirubin Urine: NEGATIVE
GLUCOSE, UA: NEGATIVE mg/dL
Ketones, ur: NEGATIVE mg/dL
Leukocytes, UA: NEGATIVE
NITRITE: NEGATIVE
Protein, ur: NEGATIVE mg/dL
Specific Gravity, Urine: 1.016 (ref 1.005–1.030)
pH: 5 (ref 5.0–8.0)

## 2017-05-12 LAB — POC OCCULT BLOOD, ED: Fecal Occult Bld: POSITIVE — AB

## 2017-05-12 LAB — ABO/RH: ABO/RH(D): O POS

## 2017-05-12 MED ORDER — FENTANYL CITRATE (PF) 100 MCG/2ML IJ SOLN
50.0000 ug | Freq: Once | INTRAMUSCULAR | Status: AC
Start: 2017-05-12 — End: 2017-05-12
  Administered 2017-05-12: 50 ug via INTRAVENOUS
  Filled 2017-05-12: qty 2

## 2017-05-12 MED ORDER — IOPAMIDOL (ISOVUE-300) INJECTION 61%
INTRAVENOUS | Status: AC
  Filled 2017-05-12: qty 100

## 2017-05-12 MED ORDER — IOPAMIDOL (ISOVUE-300) INJECTION 61%
100.0000 mL | Freq: Once | INTRAVENOUS | Status: AC | PRN
  Administered 2017-05-12: 80 mL via INTRAVENOUS

## 2017-05-12 MED ORDER — OXYCODONE-ACETAMINOPHEN 5-325 MG PO TABS: 1.0000 | ORAL_TABLET | ORAL | 0 refills | Status: DC | PRN

## 2017-05-12 MED ORDER — ONDANSETRON HCL 4 MG/2ML IJ SOLN
4.0000 mg | Freq: Once | INTRAMUSCULAR | Status: AC
  Administered 2017-05-12: 4 mg via INTRAVENOUS
  Filled 2017-05-12: qty 2

## 2017-05-12 MED ORDER — CIPROFLOXACIN HCL 500 MG PO TABS: 500.0000 mg | ORAL_TABLET | Freq: Two times a day (BID) | ORAL | 0 refills | Status: DC

## 2017-05-12 MED ORDER — METRONIDAZOLE 500 MG PO TABS: 500.0000 mg | ORAL_TABLET | Freq: Two times a day (BID) | ORAL | 0 refills | Status: DC

## 2017-05-12 MED ORDER — SODIUM CHLORIDE 0.9 % IJ SOLN
INTRAMUSCULAR | Status: AC
  Filled 2017-05-12: qty 50

## 2017-05-12 MED ORDER — ONDANSETRON 4 MG PO TBDP: 4.0000 mg | ORAL_TABLET | Freq: Three times a day (TID) | ORAL | 0 refills | Status: DC | PRN

## 2017-05-12 NOTE — ED Provider Notes (Signed)
[facility] DEPT Provider Note   CSN: [id number] Arrival date & time: 05/11/17  2103     History   Chief Complaint Chief Complaint  Patient presents with  . Rectal Bleeding  . Abdominal Pain    HPI Caitlin Wall is a 62 y.o. female.  The history is provided by the patient and medical records.  Rectal Bleeding  Associated symptoms: abdominal pain   Abdominal Pain   Associated symptoms include hematochezia and nausea.     62 year old female with history of asthma, colonic polyps, GERD, hyperlipidemia, hypertension, history of breast cancer, presenting to the ED with lower abdominal pain and rectal bleeding.  Patient reports this began about 24 hours ago.  States it initially started with a upset stomach but there was no pain associated.  States throughout the day yesterday she began having worsening pain all across her lower abdomen.  States it is somewhat crampy but sharp at the same time.  She reports bright red blood when having BM and when wiping.  Unsure if blood intermixed in the stool.  Denies fever/chills.  No urinary symptoms.  She takes daily aspirin, no other anticoagulation.  Prior abdominal surgeries include hysterectomy.  Did have prior colonoscopy in 2009-- polyps removed, also had noted diverticulosis of sigmoid colon per reports.  Past Medical History:  Diagnosis Date  . Asthma, persistent not controlled 07/27/2012  . Breast cancer ([place]) 12/28/14   Left BresastDCIS  . COLONIC POLYPS, HX OF 11/04/2007  . GERD 11/04/2007  . GERD (gastroesophageal reflux disease)   . HAIR LOSS 11/04/2007  . Hx of cardiac cath 11/15/2016   a. LHC 11/15/16 showed normal coronaries and EF 50-55% with normal EDP. (done for false positive NST)  . Hx of colonic polyps 1995   adenomatous  . Hyperlipidemia   . HYPERLIPIDEMIA 11/04/2007  . Hypertension   . HYPERTENSION 06/07/2009  . LBBB (left bundle branch block) 07/26/2011  . S/P radiation therapy  02/02/15-03/24/15   left breast 60.4GY    Patient Active Problem List   Diagnosis Date Noted  . S/P cardiac cath 12/04/2016  . Palpitations 10/23/2016  . Chest pain 10/23/2016  . Left knee pain 04/16/2016  . Left leg swelling 04/16/2016  . Hemoptysis 04/16/2016  . Right-sided chest pain 04/16/2016  . Acute deep vein thrombosis (DVT) of distal vein of left lower extremity ([place]) 04/16/2016  . Malignant neoplasm of lower-outer quadrant of left breast of female, estrogen receptor positive ([place]) 01/16/2015  . Back pain, thoracic 02/22/2013  . Lung nodules 10/14/2012  . Seasonal and perennial allergic rhinitis 07/27/2012  . Asthma, mild intermittent, well-controlled 07/27/2012  . LBBB (left bundle branch block) 07/26/2011  . Encounter for well adult exam with abnormal findings 05/18/2010  . Essential hypertension 06/07/2009  . Hyperlipidemia 11/04/2007  . GERD 11/04/2007  . COLONIC POLYPS, HX OF 11/04/2007    Past Surgical History:  Procedure Laterality Date  . ABDOMINAL HYSTERECTOMY    . ANTERIOR FUSION CERVICAL SPINE    . bilat ear surgury/hearing loss    . BREAST LUMPECTOMY WITH RADIOACTIVE SEED LOCALIZATION Left 12/28/2014   Procedure: BREAST LUMPECTOMY WITH RADIOACTIVE SEED LOCALIZATION;  Surgeon: Autumn Messing III, MD;  Location: [facility];  Service: General;  Laterality: Left;  . LEFT HEART CATH AND CORONARY ANGIOGRAPHY N/A 11/15/2016   Procedure: LEFT HEART CATH AND CORONARY ANGIOGRAPHY;  Surgeon: Belva Crome, MD;  Location: [facility];  Service: Cardiovascular;  Laterality: N/A;  OB History    No data available       Home Medications    Prior to Admission medications   Medication Sig Start Date End Date Taking? Authorizing Provider  amLODipine (NORVASC) 5 MG tablet Take 5 mg by mouth daily.   Yes [provider]  anastrozole (ARIMIDEX) 1 MG tablet Take 1 tablet (1 mg total) by mouth daily. 04/28/17  Yes Magrinat, Virgie Dad, MD  aspirin  EC 81 MG tablet Take 81 mg by mouth daily.   Yes [provider]  losartan (COZAAR) 100 MG tablet Take 1 tablet (100 mg total) by mouth daily. 10/23/16  Yes Biagio Borg, MD  Multiple Vitamin (MULTIVITAMIN WITH MINERALS) TABS tablet Take 1 tablet by mouth daily.   Yes [provider]  benzonatate (TESSALON) 200 MG capsule Take 1 capsule (200 mg total) by mouth 2 (two) times daily as needed for cough. Patient not taking: Reported on 05/12/2017 04/25/17   Tereasa Coop, PA-C    Family History Family History  Problem Relation Age of Onset  . Breast cancer Other   . Cancer Paternal Grandmother        breast  . Lung cancer Father   . Breast cancer Maternal Grandmother   . Healthy Mother   . Coronary artery disease Neg Hx   . Colon cancer Neg Hx   . Pancreatic cancer Neg Hx   . Rectal cancer Neg Hx   . Stomach cancer Neg Hx     Social History Social History   Tobacco Use  . Smoking status: Former Smoker    Packs/day: 1.00    Years: 20.00    Pack years: 20.00    Types: Cigarettes    Last attempt to quit: 08/19/1989    Years since quitting: 27.7  . Smokeless tobacco: Never Used  Substance Use Topics  . Alcohol use: No  . Drug use: No     Allergies   Codeine   Review of Systems Review of Systems  Gastrointestinal: Positive for abdominal pain, blood in stool, hematochezia and nausea.  All other systems reviewed and are negative.    Physical Exam Updated Vital Signs BP 128/73 (BP Location: Left Arm)   Pulse 85   Temp 99.4 F (37.4 C) (Oral)   Resp 18   Ht 5\' 5"  (1.651 m)   Wt 87.5 kg (193 lb)   SpO2 95%   BMI 32.12 kg/m   Physical Exam  Constitutional: She is oriented to person, place, and time. She appears well-developed and well-nourished.  HENT:  Head: Normocephalic and atraumatic.  Mouth/Throat: Oropharynx is clear and moist.  Eyes: Conjunctivae and EOM are normal. Pupils are equal, round, and reactive to light.  Neck: Normal range of  motion.  Cardiovascular: Normal rate, regular rhythm and normal heart sounds.  Pulmonary/Chest: Effort normal and breath sounds normal.  Abdominal: Soft. Bowel sounds are normal. There is tenderness in the suprapubic area and left lower quadrant. There is no rigidity and no guarding.  Genitourinary:  Genitourinary Comments: Exam chaperoned by significant other Normal rectum, no gross blood on DRE, light brown stool noted  Musculoskeletal: Normal range of motion.  Neurological: She is alert and oriented to person, place, and time.  Skin: Skin is warm and dry.  Psychiatric: She has a normal mood and affect.  Nursing note and vitals reviewed.    ED Treatments / Results  Labs (all labs ordered are listed, but only abnormal results are displayed) Labs Reviewed  COMPREHENSIVE METABOLIC PANEL - Abnormal; Notable for the following components:      Result Value   Glucose, Bld 120 (*)    Creatinine, Ser 1.15 (*)    GFR calc non Af Amer 51 (*)    GFR calc Af Amer 59 (*)    All other components within normal limits  CBC - Abnormal; Notable for the following components:   WBC 11.4 (*)    All other components within normal limits  URINALYSIS, ROUTINE W REFLEX MICROSCOPIC - Abnormal; Notable for the following components:   Hgb urine dipstick SMALL (*)    Squamous Epithelial / LPF 0-5 (*)    All other components within normal limits  POC OCCULT BLOOD, ED - Abnormal; Notable for the following components:   Fecal Occult Bld POSITIVE (*)    All other components within normal limits  LIPASE, BLOOD  TYPE AND SCREEN  ABO/RH    EKG  EKG Interpretation None       Radiology Ct Abdomen Pelvis W Contrast  Result Date: 05/12/2017 CLINICAL DATA:  Lower abdominal pain and rectal bleeding. Diverticulitis suspected. EXAM: CT ABDOMEN AND PELVIS WITH CONTRAST TECHNIQUE: Multidetector CT imaging of the abdomen and pelvis was performed using the standard protocol following bolus administration of  intravenous contrast. CONTRAST:  77mL ISOVUE-300 IOPAMIDOL (ISOVUE-300) INJECTION 61% COMPARISON:  None. FINDINGS: Lower chest: Mild compressive atelectasis in the right lower lobe adjacent to thoracic spine osteophytes. No pleural fluid or consolidation. Small hiatal hernia. Bilateral breast implants. Hepatobiliary: Tiny subcapsular low-density in the anterior left hepatic lobe is too small to characterize, but unchanged from included chest CT 04/16/2016. Gallbladder physiologically distended, no calcified stone. No biliary dilatation. Pancreas: No ductal dilatation or inflammation. Spleen: Normal in size without focal abnormality. Adrenals/Urinary Tract: Normal adrenal glands. No hydronephrosis or perinephric edema. Homogeneous renal enhancement. Small subcentimeter cyst in the right mid kidney. Urinary bladder is physiologically distended without wall thickening. Stomach/Bowel: Colonic wall thickening with pericolonic stranding involving distal transverse through the proximal descending colon consistent with colitis. No perforation. Minimal distal diverticulosis without diverticulitis. Fecalization of small bowel contents. No bowel obstruction. Stomach is nondistended with tiny hiatal hernia. Normal appendix. Vascular/Lymphatic: Mild to moderate aortic atherosclerosis without aneurysm. Mesenteric vessels are patent. No acute vascular finding. No enlarged abdominal or pelvic lymph nodes. Reproductive: Status post hysterectomy. No adnexal masses. Other: No free air, free fluid, or intra-abdominal fluid collection. Incidental clitoral piercing. Musculoskeletal: Mild scoliosis and degenerative change throughout the lumbar spine. There are no acute or suspicious osseous abnormalities. IMPRESSION: 1. Colitis of the splenic flexure may be infectious, inflammatory, or ischemic given distribution. Mesenteric vessels are patent. 2. Minimal distal colonic diverticulosis without diverticulitis. 3. Fecalization of small bowel  contents can be seen with slow transit or small intestinal bacterial growth. 4.  Aortic Atherosclerosis (ICD10-I70.0). Electronically Signed   By: Jeb Levering M.D.   On: 05/12/2017 05:42    Procedures Procedures (including critical care time)  Medications Ordered in ED Medications - No data to display   Initial Impression / Assessment and Plan / ED Course  I have reviewed the triage vital signs and the nursing notes.  Pertinent labs & imaging results that were available during my care of the patient were reviewed by me and considered in my medical decision making (see chart for details).  62 y.o. female here with abdominal pain and rectal bleeding.  This is been ongoing for about 24 hours now.  States bright red bleeding when wiping.  Abdominal  pain across lower abdomen.  She is afebrile, non-toxic.  NAD.  Screening labs overall reassuring.  H/H stable.  No gross blood on DRE but hemoccult positive.  She does have history of diverticulosis on prior colonoscopy, concern for possible diverticulitis.  Will obtain CT scan for further evaluation.  CT scan with findings of colitis.  Suspect this is the source of her pain/bleeding.  Will start on cipro/flagyl.  Rx percocet, zofran PRN.  Close PCP follow-up.  Discussed plan with patient, she acknowledged understanding and agreed with plan of care.  Return precautions given for new or worsening symptoms.  Final Clinical Impressions(s) / ED Diagnoses   Final diagnoses:  Colitis    ED Discharge Orders        Ordered    metroNIDAZOLE (FLAGYL) 500 MG tablet  2 times daily     05/12/17 0606    ciprofloxacin (CIPRO) 500 MG tablet  Every 12 hours     05/12/17 0606    oxyCODONE-acetaminophen (PERCOCET) 5-325 MG tablet  Every 4 hours PRN     05/12/17 0606    ondansetron (ZOFRAN ODT) 4 MG disintegrating tablet  Every 8 hours PRN     05/12/17 0606       Larene Pickett, PA-C 05/12/17 0712    Ripley Fraise, MD 05/12/17 [phone]

## 2017-05-12 NOTE — Discharge Instructions (Signed)
Take the prescribed medication as directed. °Follow-up with your primary care doctor. °Return to the ED for new or worsening symptoms. °

## 2017-05-12 NOTE — ED Notes (Signed)
Bed: [facility]PT1 Expected date:  Expected time:  Means of arrival:  Comments: 

## 2017-05-12 NOTE — ED Notes (Signed)
Bed: WA17 Expected date:  Expected time:  Means of arrival:  Comments: Triage 1  

## 2017-05-19 ENCOUNTER — Ambulatory Visit
Admission: RE | Admit: 2017-05-19 | Discharge: 2017-05-19 | Disposition: A | Discharge status: Home / Self Care | Payer: 59 | Source: Ambulatory Visit | Attending: Oncology | Admitting: Oncology

## 2017-05-19 ENCOUNTER — Other Ambulatory Visit: Payer: Self-pay | Admitting: Oncology

## 2017-05-19 DIAGNOSIS — I824Z2 Acute embolism and thrombosis of unspecified deep veins of left distal lower extremity: Secondary | ICD-10-CM

## 2017-05-19 DIAGNOSIS — Z170 Estrogen receptor positive status [ER+]: Principal | ICD-10-CM

## 2017-05-19 DIAGNOSIS — R928 Other abnormal and inconclusive findings on diagnostic imaging of breast: Secondary | ICD-10-CM | POA: Diagnosis not present

## 2017-05-19 DIAGNOSIS — C50512 Malignant neoplasm of lower-outer quadrant of left female breast: Secondary | ICD-10-CM

## 2017-05-19 HISTORY — DX: Personal history of irradiation: Z92.3

## 2017-05-26 ENCOUNTER — Telehealth: Payer: Self-pay

## 2017-05-26 MED ORDER — FLUCONAZOLE 150 MG PO TABS: ORAL_TABLET | ORAL | 1 refills | Status: DC

## 2017-05-26 NOTE — Telephone Encounter (Signed)
Done erx 

## 2017-05-26 NOTE — Addendum Note (Signed)
Addended by: Biagio Borg on: 05/26/2017 05:45 PM   Modules accepted: Orders

## 2017-05-26 NOTE — Telephone Encounter (Signed)
Copied from [place] [id number]. Topic: Inquiry >> May 26, 2017 12:52 PM Caitlin Wall wrote: Reason for CRM: Patient has been taking an antibiotic, which has caused her to develop a yeast infection. Patient wants to know if Dr. Jenny Reichmann would prescribe her a medication for her yeast infection and send it to her preferred pharmacy:  [facility], [place]. Someone from the office, please call patient at [phone].        Thank You!!!

## 2017-05-29 ENCOUNTER — Encounter: Payer: Self-pay | Admitting: Internal Medicine

## 2017-05-29 ENCOUNTER — Ambulatory Visit (INDEPENDENT_AMBULATORY_CARE_PROVIDER_SITE_OTHER): Payer: 59 | Admitting: Internal Medicine

## 2017-05-29 VITALS — BP 122/84 | HR 94 | Temp 98.8°F | Ht 65.0 in | Wt 195.0 lb

## 2017-05-29 DIAGNOSIS — R059 Cough, unspecified: Secondary | ICD-10-CM

## 2017-05-29 DIAGNOSIS — R05 Cough: Secondary | ICD-10-CM | POA: Diagnosis not present

## 2017-05-29 DIAGNOSIS — I10 Essential (primary) hypertension: Secondary | ICD-10-CM

## 2017-05-29 DIAGNOSIS — K529 Noninfective gastroenteritis and colitis, unspecified: Secondary | ICD-10-CM

## 2017-05-29 MED ORDER — HYDROCODONE-HOMATROPINE 5-1.5 MG/5ML PO SYRP: 5.0000 mL | ORAL_SOLUTION | Freq: Four times a day (QID) | ORAL | 0 refills | Status: AC | PRN

## 2017-05-29 NOTE — Patient Instructions (Signed)
Please take all new medication as prescribed - the cough medicine if needed  Please continue all other medications as before, and refills have been done if requested.  Please have the pharmacy call with any other refills you may need.  Please keep your appointments with your specialists as you may have planned

## 2017-05-29 NOTE — Progress Notes (Signed)
Subjective:    Patient ID: Caitlin Wall, female    DOB: November 19, 1955, 62 y.o.   MRN: [id number]  HPI   Here with acute onset mild to mod 2-3 days ST, HA, general weakness and malaise, with prod cough clearish sputum, but Pt denies chest pain, increased sob or doe, wheezing, orthopnea, PND, increased LE swelling, palpitations, dizziness or syncope.  No sick contacts.  Denies worsening reflux, abd pain, dysphagia, n/v, bowel change or blood, as colitis recent has resolved  Pt denies new neurological symptoms such as new headache, or facial or extremity weakness or numbness   Pt denies polydipsia, polyuria    Past Medical History:  Diagnosis Date  . Asthma, persistent not controlled 07/27/2012  . Breast cancer ([place]) 12/28/14   Left BresastDCIS  . COLONIC POLYPS, HX OF 11/04/2007  . GERD 11/04/2007  . GERD (gastroesophageal reflux disease)   . HAIR LOSS 11/04/2007  . Hx of cardiac cath 11/15/2016   a. LHC 11/15/16 showed normal coronaries and EF 50-55% with normal EDP. (done for false positive NST)  . Hx of colonic polyps 1995   adenomatous  . Hyperlipidemia   . HYPERLIPIDEMIA 11/04/2007  . Hypertension   . HYPERTENSION 06/07/2009  . LBBB (left bundle branch block) 07/26/2011  . Personal history of radiation therapy   . S/P radiation therapy 02/02/15-03/24/15   left breast 62.4GY   Past Surgical History:  Procedure Laterality Date  . ABDOMINAL HYSTERECTOMY    . ANTERIOR FUSION CERVICAL SPINE    . AUGMENTATION MAMMAPLASTY Bilateral   . bilat ear surgury/hearing loss    . BREAST LUMPECTOMY Left 2016  . BREAST LUMPECTOMY WITH RADIOACTIVE SEED LOCALIZATION Left 12/28/2014   Procedure: BREAST LUMPECTOMY WITH RADIOACTIVE SEED LOCALIZATION;  Surgeon: Autumn Messing III, MD;  Location: [facility];  Service: General;  Laterality: Left;  . LEFT HEART CATH AND CORONARY ANGIOGRAPHY N/A 11/15/2016   Procedure: LEFT HEART CATH AND CORONARY ANGIOGRAPHY;  Surgeon: Belva Crome, MD;  Location: [facility];  Service: Cardiovascular;  Laterality: N/A;    reports that she quit smoking about 27 years ago. Her smoking use included cigarettes. She has a 20.00 pack-year smoking history. She has never used smokeless tobacco. She reports that she does not drink alcohol or use drugs. family history includes Breast cancer in her maternal grandmother and other; Cancer in her paternal grandmother; Healthy in her mother; Lung cancer in her father. Allergies  Allergen Reactions  . Codeine     REACTION: nausea,   Current Outpatient Medications on File Prior to Visit  Medication Sig Dispense Refill  . amLODipine (NORVASC) 5 MG tablet Take 5 mg by mouth daily.    Marland Kitchen anastrozole (ARIMIDEX) 1 MG tablet Take 1 tablet (1 mg total) by mouth daily. 90 tablet 4  . aspirin EC 81 MG tablet Take 81 mg by mouth daily.    . benzonatate (TESSALON) 200 MG capsule Take 1 capsule (200 mg total) by mouth 2 (two) times daily as needed for cough. 20 capsule 0  . ciprofloxacin (CIPRO) 500 MG tablet Take 1 tablet (500 mg total) by mouth every 12 (twelve) hours. 20 tablet 0  . fluconazole (DIFLUCAN) 150 MG tablet 1 tab by mouth every 3 days as needed 2 tablet 1  . losartan (COZAAR) 100 MG tablet Take 1 tablet (100 mg total) by mouth daily. 90 tablet 3  . metroNIDAZOLE (FLAGYL) 500 MG tablet Take 1 tablet (500 mg total) by mouth 2 (  two) times daily. 20 tablet 0  . Multiple Vitamin (MULTIVITAMIN WITH MINERALS) TABS tablet Take 1 tablet by mouth daily.    . ondansetron (ZOFRAN ODT) 4 MG disintegrating tablet Take 1 tablet (4 mg total) by mouth every 8 (eight) hours as needed for nausea. 10 tablet 0  . oxyCODONE-acetaminophen (PERCOCET) 5-325 MG tablet Take 1 tablet by mouth every 4 (four) hours as needed. 20 tablet 0   No current facility-administered medications on file prior to visit.    Review of Systems  Constitutional: Negative for other unusual diaphoresis or sweats HENT: Negative for ear discharge or  swelling Eyes: Negative for other worsening visual disturbances Respiratory: Negative for stridor or other swelling  Gastrointestinal: Negative for worsening distension or other blood Genitourinary: Negative for retention or other urinary change Musculoskeletal: Negative for other MSK pain or swelling Skin: Negative for color change or other new lesions Neurological: Negative for worsening tremors and other numbness  Psychiatric/Behavioral: Negative for worsening agitation or other fatigue All other system neg per pt    Objective:   Physical Exam BP 122/84   Pulse 94   Temp 98.8 F (37.1 C) (Oral)   Ht 5\' 5"  (1.651 m)   Wt 195 lb (88.5 kg)   SpO2 98%   BMI 32.45 kg/m  VS noted, mild ill Constitutional: Pt appears in NAD HENT: Head: NCAT.  Right Ear: External ear normal.  Left Ear: External ear normal.  Eyes: . Pupils are equal, round, and reactive to light. Conjunctivae and EOM are normal Nose: without d/c or deformity Bilat tm's with mild erythema.  Max sinus areas non tender.  Pharynx with mild erythema, no exudate Neck: Neck supple. Gross normal ROM Cardiovascular: Normal rate and regular rhythm.   Pulmonary/Chest: Effort normal and breath sounds without rales or wheezing.  Abd:  Soft, NT, ND, + BS, no organomegaly Neurological: Pt is alert. At baseline orientation, motor grossly intact Skin: Skin is warm. No rashes, other new lesions, no LE edema Psychiatric: Pt behavior is normal without agitation  No other exam findings    Assessment & Plan:

## 2017-05-30 ENCOUNTER — Telehealth: Payer: Self-pay | Admitting: Internal Medicine

## 2017-05-30 MED ORDER — HYDROCOD POLST-CPM POLST ER 10-8 MG/5ML PO SUER: 5.0000 mL | Freq: Two times a day (BID) | ORAL | 0 refills | Status: DC | PRN

## 2017-05-30 NOTE — Telephone Encounter (Signed)
Done erx 

## 2017-05-30 NOTE — Telephone Encounter (Signed)
Copied from [place] [id number]. Topic: Quick Communication - Rx Refill/Question >> May 30, 2017  1:21 PM Waylan Rocher, Lumin L wrote: Medication: HYDROcodone-homatropine (HYCODAN) 5-1.5 MG/5ML syrup  Has the patient contacted their pharmacy? Yes.   (Agent: If no, request that the patient contact the pharmacy for the refill.) Preferred Pharmacy (with phone number or street name): [facility] [id number] [facility], [place] [address] Phone: [phone] Fax: [phone] Agent: Please be advised that RX refills may take up to 3 business days. We ask that you follow-up with your pharmacy.  Pharmacy calling because the medication is on backorder. They suggested writing a new script for Tussionex OR Promethazine w/ Codeine.

## 2017-05-31 DIAGNOSIS — K529 Noninfective gastroenteritis and colitis, unspecified: Secondary | ICD-10-CM | POA: Insufficient documentation

## 2017-05-31 DIAGNOSIS — R059 Cough, unspecified: Secondary | ICD-10-CM | POA: Insufficient documentation

## 2017-05-31 DIAGNOSIS — R05 Cough: Secondary | ICD-10-CM | POA: Insufficient documentation

## 2017-05-31 NOTE — Assessment & Plan Note (Signed)
Mild to mod, c/w viral illness, for cough med prn,  to f/u any worsening symptoms or concerns

## 2017-05-31 NOTE — Assessment & Plan Note (Signed)
stable overall by history and exam, recent data reviewed with pt, and pt to continue medical treatment as before,  to f/u any worsening symptoms or concerns BP Readings from Last 3 Encounters:  05/29/17 122/84  05/12/17 118/68  04/28/17 105/65

## 2017-05-31 NOTE — Assessment & Plan Note (Signed)
Confirmed by CT, now resolved after antibx,  to f/u any worsening symptoms or concerns

## 2017-10-02 ENCOUNTER — Encounter: Payer: Self-pay | Admitting: Gastroenterology

## 2017-10-20 ENCOUNTER — Other Ambulatory Visit (INDEPENDENT_AMBULATORY_CARE_PROVIDER_SITE_OTHER): Payer: 59

## 2017-10-20 DIAGNOSIS — Z0000 Encounter for general adult medical examination without abnormal findings: Secondary | ICD-10-CM

## 2017-10-20 DIAGNOSIS — Z114 Encounter for screening for human immunodeficiency virus [HIV]: Secondary | ICD-10-CM

## 2017-10-20 LAB — URINALYSIS, ROUTINE W REFLEX MICROSCOPIC
BILIRUBIN URINE: NEGATIVE
Ketones, ur: NEGATIVE
NITRITE: NEGATIVE
PH: 6 (ref 5.0–8.0)
Specific Gravity, Urine: 1.02 (ref 1.000–1.030)
TOTAL PROTEIN, URINE-UPE24: NEGATIVE
URINE GLUCOSE: NEGATIVE
UROBILINOGEN UA: 1 (ref 0.0–1.0)

## 2017-10-20 LAB — CBC WITH DIFFERENTIAL/PLATELET
BASOS PCT: 0.5 % (ref 0.0–3.0)
Basophils Absolute: 0 10*3/uL (ref 0.0–0.1)
EOS ABS: 0.1 10*3/uL (ref 0.0–0.7)
Eosinophils Relative: 1.5 % (ref 0.0–5.0)
HEMATOCRIT: 39.8 % (ref 36.0–46.0)
Hemoglobin: 13.4 g/dL (ref 12.0–15.0)
LYMPHS PCT: 26.8 % (ref 12.0–46.0)
Lymphs Abs: 2.2 10*3/uL (ref 0.7–4.0)
MCHC: 33.7 g/dL (ref 30.0–36.0)
MCV: 89.7 fl (ref 78.0–100.0)
Monocytes Absolute: 0.8 10*3/uL (ref 0.1–1.0)
Monocytes Relative: 9.1 % (ref 3.0–12.0)
NEUTROS ABS: 5.1 10*3/uL (ref 1.4–7.7)
Neutrophils Relative %: 62.1 % (ref 43.0–77.0)
PLATELETS: 237 10*3/uL (ref 150.0–400.0)
RBC: 4.44 Mil/uL (ref 3.87–5.11)
RDW: 12.8 % (ref 11.5–15.5)
WBC: 8.2 10*3/uL (ref 4.0–10.5)

## 2017-10-20 LAB — LIPID PANEL
CHOLESTEROL: 164 mg/dL (ref 0–200)
HDL: 39.2 mg/dL (ref >39.00)
LDL CALC: 105 mg/dL — AB (ref 0–99)
NonHDL: 125.19
Total CHOL/HDL Ratio: 4
Triglycerides: 99 mg/dL (ref 0.0–149.0)
VLDL: 19.8 mg/dL (ref 0.0–40.0)

## 2017-10-20 LAB — HEPATIC FUNCTION PANEL
ALT: 18 U/L (ref 0–35)
AST: 20 U/L (ref 0–37)
Albumin: 4 g/dL (ref 3.5–5.2)
Alkaline Phosphatase: 137 U/L — ABNORMAL HIGH (ref 39–117)
Bilirubin, Direct: 0.1 mg/dL (ref 0.0–0.3)
TOTAL PROTEIN: 7.3 g/dL (ref 6.0–8.3)
Total Bilirubin: 1 mg/dL (ref 0.2–1.2)

## 2017-10-20 LAB — BASIC METABOLIC PANEL
BUN: 16 mg/dL (ref 6–23)
CHLORIDE: 106 meq/L (ref 96–112)
CO2: 29 mEq/L (ref 19–32)
Calcium: 9.7 mg/dL (ref 8.4–10.5)
Creatinine, Ser: 1.03 mg/dL (ref 0.40–1.20)
GFR: 57.97 mL/min — ABNORMAL LOW (ref >60.00)
Glucose, Bld: 99 mg/dL (ref 70–99)
Potassium: 4 mEq/L (ref 3.5–5.1)
Sodium: 141 mEq/L (ref 135–145)

## 2017-10-20 LAB — TSH: TSH: 1.71 u[IU]/mL (ref 0.35–4.50)

## 2017-10-20 NOTE — Addendum Note (Signed)
Addended by: Isaiah Serge D on: 10/20/2017 01:03 PM   Modules accepted: Orders

## 2017-10-21 ENCOUNTER — Telehealth: Payer: Self-pay

## 2017-10-21 LAB — HIV ANTIBODY (ROUTINE TESTING W REFLEX): HIV: NONREACTIVE

## 2017-10-21 NOTE — Telephone Encounter (Signed)
-----   Message from Biagio Borg, MD sent at 10/20/2017  4:47 PM EDT ----- Pt has f/u appt aug 30, but UA is slightly abnormal  OK to call pt - ? Any UTI symptoms such as pain, urgency, freq, or fever

## 2017-10-21 NOTE — Telephone Encounter (Signed)
Called pt, VM has not been set up yet.  

## 2017-10-24 ENCOUNTER — Encounter: Payer: BLUE CROSS/BLUE SHIELD | Admitting: Internal Medicine

## 2017-10-24 ENCOUNTER — Encounter: Payer: Self-pay | Admitting: Internal Medicine

## 2017-10-24 ENCOUNTER — Ambulatory Visit (INDEPENDENT_AMBULATORY_CARE_PROVIDER_SITE_OTHER): Payer: 59 | Admitting: Internal Medicine

## 2017-10-24 VITALS — BP 122/82 | HR 89 | Temp 98.6°F | Ht 65.0 in | Wt 197.0 lb

## 2017-10-24 DIAGNOSIS — Z23 Encounter for immunization: Secondary | ICD-10-CM

## 2017-10-24 DIAGNOSIS — I10 Essential (primary) hypertension: Secondary | ICD-10-CM | POA: Diagnosis not present

## 2017-10-24 DIAGNOSIS — Z0000 Encounter for general adult medical examination without abnormal findings: Secondary | ICD-10-CM | POA: Diagnosis not present

## 2017-10-24 MED ORDER — LOSARTAN POTASSIUM 100 MG PO TABS
100.0000 mg | ORAL_TABLET | Freq: Every day | ORAL | 3 refills | Status: DC
Start: 2017-10-24 — End: 2018-11-25

## 2017-10-24 MED ORDER — AMLODIPINE BESYLATE 5 MG PO TABS: 5.0000 mg | ORAL_TABLET | Freq: Every day | ORAL | 3 refills | Status: DC

## 2017-10-24 NOTE — Patient Instructions (Addendum)
You had the flu shot today  Please remember to call for your follow up colonoscopy  Please continue all other medications as before, and refills have been done if requested.  Please have the pharmacy call with any other refills you may need.  Please continue your efforts at being more active, low cholesterol diet, and weight control.  You are otherwise up to date with prevention measures today.  Please keep your appointments with your specialists as you may have planned  Please return in 1 year for your yearly visit, or sooner if needed, with Lab testing done 3-5 days before

## 2017-10-24 NOTE — Progress Notes (Signed)
Subjective:    Patient ID: Caitlin Wall, female    DOB: 1955/05/02, 62 y.o.   MRN: [id number]  HPI  Here for wellness and f/u;  Overall doing ok;  Pt denies Chest pain, worsening SOB, DOE, wheezing, orthopnea, PND, worsening LE edema, palpitations, dizziness or syncope.  Pt denies neurological change such as new headache, facial or extremity weakness.  Pt denies polydipsia, polyuria, or low sugar symptoms. Pt states overall good compliance with treatment and medications, good tolerability, and has been trying to follow appropriate diet.  Pt denies worsening depressive symptoms, suicidal ideation or panic. No fever, night sweats, wt loss, loss of appetite, or other constitutional symptoms.  Pt states good ability with ADL's, has low fall risk, home safety reviewed and adequate, no other significant changes in hearing or vision, and only occasionally active with exercise.  No new complaints Past Medical History:  Diagnosis Date  . Asthma, persistent not controlled 07/27/2012  . Breast cancer ([place]) 12/28/14   Left BresastDCIS  . COLONIC POLYPS, HX OF 11/04/2007  . GERD 11/04/2007  . GERD (gastroesophageal reflux disease)   . HAIR LOSS 11/04/2007  . Hx of cardiac cath 11/15/2016   a. LHC 11/15/16 showed normal coronaries and EF 50-55% with normal EDP. (done for false positive NST)  . Hx of colonic polyps 1995   adenomatous  . Hyperlipidemia   . HYPERLIPIDEMIA 11/04/2007  . Hypertension   . HYPERTENSION 06/07/2009  . LBBB (left bundle branch block) 07/26/2011  . Personal history of radiation therapy   . S/P radiation therapy 02/02/15-03/24/15   left breast 62.4GY   Past Surgical History:  Procedure Laterality Date  . ABDOMINAL HYSTERECTOMY    . ANTERIOR FUSION CERVICAL SPINE    . AUGMENTATION MAMMAPLASTY Bilateral   . bilat ear surgury/hearing loss    . BREAST LUMPECTOMY Left 2016  . BREAST LUMPECTOMY WITH RADIOACTIVE SEED LOCALIZATION Left 12/28/2014   Procedure: BREAST LUMPECTOMY WITH  RADIOACTIVE SEED LOCALIZATION;  Surgeon: Autumn Messing III, MD;  Location: [facility];  Service: General;  Laterality: Left;  . LEFT HEART CATH AND CORONARY ANGIOGRAPHY N/A 11/15/2016   Procedure: LEFT HEART CATH AND CORONARY ANGIOGRAPHY;  Surgeon: Belva Crome, MD;  Location: [facility];  Service: Cardiovascular;  Laterality: N/A;    reports that she quit smoking about 28 years ago. Her smoking use included cigarettes. She has a 20.00 pack-year smoking history. She has never used smokeless tobacco. She reports that she does not drink alcohol or use drugs. family history includes Breast cancer in her maternal grandmother and other; Cancer in her paternal grandmother; Healthy in her mother; Lung cancer in her father. Allergies  Allergen Reactions  . Codeine     REACTION: nausea,   Current Outpatient Medications on File Prior to Visit  Medication Sig Dispense Refill  . anastrozole (ARIMIDEX) 1 MG tablet Take 1 tablet (1 mg total) by mouth daily. 90 tablet 4  . aspirin EC 81 MG tablet Take 81 mg by mouth daily.    . Multiple Vitamin (MULTIVITAMIN WITH MINERALS) TABS tablet Take 1 tablet by mouth daily.     No current facility-administered medications on file prior to visit.    Review of Systems Constitutional: Negative for other unusual diaphoresis, sweats, appetite or weight changes HENT: Negative for other worsening hearing loss, ear pain, facial swelling, mouth sores or neck stiffness.   Eyes: Negative for other worsening pain, redness or other visual disturbance.  Respiratory: Negative for other  stridor or swelling Cardiovascular: Negative for other palpitations or other chest pain  Gastrointestinal: Negative for worsening diarrhea or loose stools, blood in stool, distention or other pain Genitourinary: Negative for hematuria, flank pain or other change in urine volume.  Musculoskeletal: Negative for myalgias or other joint swelling.  Skin: Negative for other color  change, or other wound or worsening drainage.  Neurological: Negative for other syncope or numbness. Hematological: Negative for other adenopathy or swelling Psychiatric/Behavioral: Negative for hallucinations, other worsening agitation, SI, self-injury, or new decreased concentration All other system neg per pt    Objective:   Physical Exam BP 122/82   Pulse 89   Temp 98.6 F (37 C) (Oral)   Ht 5\' 5"  (1.651 m)   Wt 197 lb (89.4 kg)   SpO2 94%   BMI 32.78 kg/m  VS noted,  Constitutional: Pt is oriented to person, place, and time. Appears well-developed and well-nourished, in no significant distress and comfortable Head: Normocephalic and atraumatic  Eyes: Conjunctivae and EOM are normal. Pupils are equal, round, and reactive to light Right Ear: External ear normal without discharge Left Ear: External ear normal without discharge Nose: Nose without discharge or deformity Mouth/Throat: Oropharynx is without other ulcerations and moist  Neck: Normal range of motion. Neck supple. No JVD present. No tracheal deviation present or significant neck LA or mass Cardiovascular: Normal rate, regular rhythm, normal heart sounds and intact distal pulses  Pulmonary/Chest: WOB normal and breath sounds without rales or wheezing  Abdominal: Soft. Bowel sounds are normal. NT. No HSM  Musculoskeletal: Normal range of motion. Exhibits no edema Lymphadenopathy: Has no other cervical adenopathy.  Neurological: Pt is alert and oriented to person, place, and time. Pt has normal reflexes. No cranial nerve deficit. Motor grossly intact, Gait intact Skin: Skin is warm and dry. No rash noted or new ulcerations Psychiatric:  Has normal mood and affect. Behavior is normal without agitation No other exam findings Lab Results  Component Value Date   WBC 8.2 10/20/2017   HGB 13.4 10/20/2017   HCT 39.8 10/20/2017   PLT 237.0 10/20/2017   GLUCOSE 99 10/20/2017   CHOL 164 10/20/2017   TRIG 99.0 10/20/2017    HDL 39.20 10/20/2017   LDLCALC 105 (H) 10/20/2017   ALT 18 10/20/2017   AST 20 10/20/2017   NA 141 10/20/2017   K 4.0 10/20/2017   CL 106 10/20/2017   CREATININE 1.03 10/20/2017   BUN 16 10/20/2017   CO2 29 10/20/2017   TSH 1.71 10/20/2017   INR 1.0 11/12/2016         Assessment & Plan:

## 2017-10-25 NOTE — Assessment & Plan Note (Signed)

## 2017-10-25 NOTE — Assessment & Plan Note (Signed)
stable overall by history and exam, recent data reviewed with pt, and pt to continue medical treatment as before,  to f/u any worsening symptoms or concerns  

## 2017-11-25 ENCOUNTER — Encounter: Payer: Self-pay | Admitting: Gastroenterology

## 2017-12-22 ENCOUNTER — Ambulatory Visit: Payer: Self-pay | Admitting: *Deleted
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
## 2017-12-23 ENCOUNTER — Encounter (HOSPITAL_COMMUNITY): Payer: Self-pay

## 2017-12-23 ENCOUNTER — Ambulatory Visit (INDEPENDENT_AMBULATORY_CARE_PROVIDER_SITE_OTHER): Payer: 59

## 2017-12-23 ENCOUNTER — Ambulatory Visit (HOSPITAL_COMMUNITY)
Admission: EM | Admit: 2017-12-23 | Discharge: 2017-12-23 | Disposition: A | Discharge status: Home / Self Care | Payer: 59 | Attending: Family Medicine | Admitting: Family Medicine

## 2017-12-23 ENCOUNTER — Ambulatory Visit: Payer: Self-pay | Admitting: Family Medicine

## 2017-12-23 DIAGNOSIS — R0789 Other chest pain: Secondary | ICD-10-CM

## 2017-12-23 DIAGNOSIS — S298XXA Other specified injuries of thorax, initial encounter: Secondary | ICD-10-CM

## 2017-12-23 DIAGNOSIS — R072 Precordial pain: Secondary | ICD-10-CM | POA: Diagnosis not present

## 2017-12-23 DIAGNOSIS — R079 Chest pain, unspecified: Secondary | ICD-10-CM | POA: Diagnosis not present

## 2017-12-23 DIAGNOSIS — S299XXA Unspecified injury of thorax, initial encounter: Secondary | ICD-10-CM | POA: Diagnosis not present

## 2017-12-23 MED ORDER — NAPROXEN 500 MG PO TABS: 500.0000 mg | ORAL_TABLET | Freq: Two times a day (BID) | ORAL | 0 refills | Status: DC

## 2017-12-23 MED ORDER — HYDROCODONE-ACETAMINOPHEN 7.5-325 MG PO TABS: 2.0000 | ORAL_TABLET | Freq: Four times a day (QID) | ORAL | 0 refills | Status: AC | PRN

## 2017-12-23 NOTE — ED Triage Notes (Signed)
Pt presents with complaints of chest pain x 3 weeks. Reports a constant pain in the center of her chest that is worse with movement. Denies any associated symptoms or events causing the pain.

## 2017-12-23 NOTE — Discharge Instructions (Addendum)
Take Naprosyn twice a day with food Take pain medicine as needed, mostly to sleep Do not drive while taking pain medicine.  The pain medicine can cause constipation and drowsiness. Follow-up with Dr. Jenny Reichmann if not better in the next couple weeks. CAT scan was discussed as a test available for confirmation.  If your pain persists, Dr. Jenny Reichmann can order this for you
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
## 2017-12-24 ENCOUNTER — Telehealth: Payer: Self-pay

## 2017-12-24 ENCOUNTER — Ambulatory Visit (AMBULATORY_SURGERY_CENTER): Payer: Self-pay

## 2017-12-24 ENCOUNTER — Encounter: Payer: Self-pay | Admitting: Gastroenterology

## 2017-12-24 ENCOUNTER — Other Ambulatory Visit: Payer: Self-pay

## 2017-12-24 VITALS — Ht 65.0 in | Wt 199.0 lb

## 2017-12-24 DIAGNOSIS — Z86010 Personal history of colonic polyps: Secondary | ICD-10-CM

## 2017-12-24 MED ORDER — NA SULFATE-K SULFATE-MG SULF 17.5-3.13-1.6 GM/177ML PO SOLN: 1.0000 | Freq: Once | ORAL | 0 refills | Status: AC
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
## 2017-12-24 NOTE — Telephone Encounter (Signed)
Noted to keep procedure as scheduled.

## 2017-12-24 NOTE — Progress Notes (Signed)
No egg or soy allergy known to patient  No issues with past sedation with any surgeries  or procedures, no intubation problems  No diet pills per patient No home 02 use per patient  No blood thinners per patient  Pt denies issues with constipation, problems with constipation No A fib or A flutter  EMMI video sent to pt's e mail , pt declined

## 2017-12-24 NOTE — Telephone Encounter (Signed)
Reviewed her ED evaluation on 10/29. Her colonoscopy is 2 weeks out. OK to keep procedure as scheduled.

## 2018-01-07 ENCOUNTER — Encounter: Payer: 59 | Admitting: Gastroenterology

## 2018-01-09 ENCOUNTER — Encounter: Payer: Self-pay | Admitting: Internal Medicine

## 2018-01-09 ENCOUNTER — Ambulatory Visit (INDEPENDENT_AMBULATORY_CARE_PROVIDER_SITE_OTHER): Payer: 59 | Admitting: Internal Medicine

## 2018-01-09 ENCOUNTER — Encounter

## 2018-01-09 ENCOUNTER — Other Ambulatory Visit (INDEPENDENT_AMBULATORY_CARE_PROVIDER_SITE_OTHER): Payer: 59

## 2018-01-09 VITALS — BP 138/86 | HR 83 | Temp 97.7°F | Ht 65.0 in | Wt 200.0 lb

## 2018-01-09 DIAGNOSIS — K2970 Gastritis, unspecified, without bleeding: Secondary | ICD-10-CM | POA: Diagnosis not present

## 2018-01-09 DIAGNOSIS — S2220XA Unspecified fracture of sternum, initial encounter for closed fracture: Secondary | ICD-10-CM | POA: Insufficient documentation

## 2018-01-09 DIAGNOSIS — K921 Melena: Secondary | ICD-10-CM

## 2018-01-09 DIAGNOSIS — I10 Essential (primary) hypertension: Secondary | ICD-10-CM | POA: Diagnosis not present

## 2018-01-09 DIAGNOSIS — S2220XD Unspecified fracture of sternum, subsequent encounter for fracture with routine healing: Secondary | ICD-10-CM | POA: Diagnosis not present

## 2018-01-09 LAB — CBC WITH DIFFERENTIAL/PLATELET
Basophils Absolute: 0 10*3/uL (ref 0.0–0.1)
Basophils Relative: 0.5 % (ref 0.0–3.0)
Eosinophils Absolute: 0.6 10*3/uL (ref 0.0–0.7)
Eosinophils Relative: 6.7 % — ABNORMAL HIGH (ref 0.0–5.0)
HCT: 40.8 % (ref 36.0–46.0)
Hemoglobin: 14 g/dL (ref 12.0–15.0)
Lymphocytes Relative: 24.8 % (ref 12.0–46.0)
Lymphs Abs: 2.1 10*3/uL (ref 0.7–4.0)
MCHC: 34.3 g/dL (ref 30.0–36.0)
MCV: 88.3 fl (ref 78.0–100.0)
MONO ABS: 0.9 10*3/uL (ref 0.1–1.0)
Monocytes Relative: 10.6 % (ref 3.0–12.0)
Neutro Abs: 4.8 10*3/uL (ref 1.4–7.7)
Neutrophils Relative %: 57.4 % (ref 43.0–77.0)
Platelets: 282 10*3/uL (ref 150.0–400.0)
RBC: 4.62 Mil/uL (ref 3.87–5.11)
RDW: 13.3 % (ref 11.5–15.5)
WBC: 8.4 10*3/uL (ref 4.0–10.5)

## 2018-01-09 MED ORDER — PANTOPRAZOLE SODIUM 40 MG PO TBEC: 40.0000 mg | DELAYED_RELEASE_TABLET | Freq: Every day | ORAL | 0 refills | Status: DC

## 2018-01-09 MED ORDER — HYDROCODONE-ACETAMINOPHEN 7.5-325 MG PO TABS: 1.0000 | ORAL_TABLET | Freq: Four times a day (QID) | ORAL | 0 refills | Status: DC | PRN

## 2018-01-09 NOTE — Assessment & Plan Note (Signed)
Not likely related to above, for cbc, f/u colonoscopy as planned

## 2018-01-09 NOTE — Patient Instructions (Signed)
Please take all new medication as prescribed - the pain medication  Please consider follow up with Sports Medicine in this office if not improved in next 2 weeks  Please continue all other medications as before, and refills have been done if requested.  Please have the pharmacy call with any other refills you may need.  Please keep your appointments with your specialists as you may have planned - colonoscopy next month  Please go to the LAB in the Basement (turn left off the elevator) for the tests to be done today  You will be contacted by phone if any changes need to be made immediately.  Otherwise, you will receive a letter about your results with an explanation, but please check with MyChart first.  Please remember to sign up for MyChart if you have not done so, as this will be important to you in the future with finding out test results, communicating by private email, and scheduling acute appointments online when needed.   Marland Kitchen

## 2018-01-09 NOTE — Assessment & Plan Note (Signed)
To d/c nsaids, add protonix 40 qd for 1 mo

## 2018-01-09 NOTE — Assessment & Plan Note (Signed)
[place] for further pain control, f/u sports medicine if not improved in 1-2 wks
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
## 2018-01-09 NOTE — Assessment & Plan Note (Signed)
stable overall by history and exam, recent data reviewed with pt, and pt to continue medical treatment as before,  to f/u any worsening symptoms or concerns  

## 2018-01-26 ENCOUNTER — Telehealth: Payer: Self-pay

## 2018-01-26 DIAGNOSIS — S2220XD Unspecified fracture of sternum, subsequent encounter for fracture with routine healing: Secondary | ICD-10-CM

## 2018-01-26 NOTE — Telephone Encounter (Signed)
Referral done
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
## 2018-01-26 NOTE — Addendum Note (Signed)
Addended by: Biagio Borg on: 01/26/2018 05:46 PM   Modules accepted: Orders
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
## 2018-01-29 ENCOUNTER — Encounter: Payer: Self-pay | Admitting: Family Medicine

## 2018-01-29 ENCOUNTER — Ambulatory Visit: Payer: Self-pay

## 2018-01-29 ENCOUNTER — Ambulatory Visit (INDEPENDENT_AMBULATORY_CARE_PROVIDER_SITE_OTHER): Payer: 59 | Admitting: Family Medicine

## 2018-01-29 VITALS — BP 138/98 | HR 83 | Ht 65.0 in | Wt 197.0 lb

## 2018-01-29 DIAGNOSIS — R0789 Other chest pain: Secondary | ICD-10-CM | POA: Diagnosis not present

## 2018-01-29 DIAGNOSIS — S2222XA Fracture of body of sternum, initial encounter for closed fracture: Secondary | ICD-10-CM | POA: Diagnosis not present

## 2018-01-29 MED ORDER — VITAMIN D (ERGOCALCIFEROL) 1.25 MG (50000 UNIT) PO CAPS: 50000.0000 [IU] | ORAL_CAPSULE | ORAL | 0 refills | Status: DC

## 2018-01-29 NOTE — Patient Instructions (Addendum)
Good to meet you  We will get CT chest and look at the bone and the little swelling I see.  Once weekly vitamin D  For 12 weeks  pennsaid pinkie amount topically 2 times daily as needed.  We will call you with the results and discuss
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
## 2018-01-30 ENCOUNTER — Inpatient Hospital Stay: Admission: RE | Admit: 2018-01-30 | Payer: 59 | Source: Ambulatory Visit

## 2018-02-02 ENCOUNTER — Telehealth: Payer: Self-pay | Admitting: Internal Medicine

## 2018-02-02 NOTE — Telephone Encounter (Signed)
Copied from [place] [phone]. Topic: General - Other >> Feb 02, 2018  9:18 AM Lennox Solders wrote: Reason for CRM:pt has called pharm and was told to call her md office. Pt needs refill on hydrocodone . [facility] in [place]

## 2018-02-03 MED ORDER — HYDROCODONE-ACETAMINOPHEN 7.5-325 MG PO TABS: 1.0000 | ORAL_TABLET | Freq: Four times a day (QID) | ORAL | 0 refills | Status: DC | PRN

## 2018-02-03 NOTE — Telephone Encounter (Signed)
Done erx 

## 2018-02-06 ENCOUNTER — Ambulatory Visit (INDEPENDENT_AMBULATORY_CARE_PROVIDER_SITE_OTHER)
Admission: RE | Admit: 2018-02-06 | Discharge: 2018-02-06 | Disposition: A | Discharge status: Home / Self Care | Payer: 59 | Source: Ambulatory Visit | Attending: Family Medicine | Admitting: Family Medicine

## 2018-02-06 ENCOUNTER — Encounter (INDEPENDENT_AMBULATORY_CARE_PROVIDER_SITE_OTHER): Payer: Self-pay

## 2018-02-06 DIAGNOSIS — S2222XA Fracture of body of sternum, initial encounter for closed fracture: Secondary | ICD-10-CM

## 2018-02-06 DIAGNOSIS — R0789 Other chest pain: Secondary | ICD-10-CM | POA: Diagnosis not present

## 2018-02-06 DIAGNOSIS — S2220XA Unspecified fracture of sternum, initial encounter for closed fracture: Secondary | ICD-10-CM | POA: Diagnosis not present

## 2018-02-09 ENCOUNTER — Other Ambulatory Visit: Payer: Self-pay | Admitting: *Deleted

## 2018-02-09 DIAGNOSIS — S2222XA Fracture of body of sternum, initial encounter for closed fracture: Secondary | ICD-10-CM

## 2018-02-09 DIAGNOSIS — Z853 Personal history of malignant neoplasm of breast: Secondary | ICD-10-CM

## 2018-02-10 ENCOUNTER — Telehealth: Payer: Self-pay | Admitting: *Deleted

## 2018-02-10 ENCOUNTER — Encounter: Payer: 59 | Admitting: Gastroenterology

## 2018-02-10 DIAGNOSIS — C50919 Malignant neoplasm of unspecified site of unspecified female breast: Secondary | ICD-10-CM

## 2018-02-10 NOTE — Telephone Encounter (Signed)
-----   Message from Caitlin Pulley, DO sent at 02/07/2018 10:48 AM EST ----- Called patient and gave her results.  Concern for the fracture and the potential for a recurrent cancer.  Discussed need for further imaging Discussed with patient to go to ER if worsening.  Patient verbalized understanding PLEASE order Bone Scan ASAP  And see if we can get her in with her oncologist as well.  Thank you!

## 2018-02-10 NOTE — Telephone Encounter (Signed)
This RN received communication stating need for MD visit per referral from Dr Charlann Boxer ( referral to Dr Jannifer Rodney at [facility] ) due to  abnormalities noted on recent non contrast CT and concern for possible lytic lesions secondary to breast cancer history. Dr Tamala Julian also placed order for bone scan.  This RN contacted pt who verified above.  Noted pt's bone scan scheduled for 02/16/2018.  MD follow up made for 12/31.  Pt verbalized understanding of date and time.  No further questions at this time.

## 2018-02-16 ENCOUNTER — Encounter (HOSPITAL_COMMUNITY)
Admission: RE | Admit: 2018-02-16 | Discharge: 2018-02-16 | Disposition: A | Discharge status: Home / Self Care | Payer: 59 | Source: Ambulatory Visit | Attending: Family Medicine | Admitting: Family Medicine

## 2018-02-16 ENCOUNTER — Ambulatory Visit (HOSPITAL_COMMUNITY)
Admission: RE | Admit: 2018-02-16 | Discharge: 2018-02-16 | Disposition: A | Discharge status: Home / Self Care | Payer: 59 | Source: Ambulatory Visit | Attending: Family Medicine | Admitting: Family Medicine

## 2018-02-16 DIAGNOSIS — S2222XA Fracture of body of sternum, initial encounter for closed fracture: Secondary | ICD-10-CM

## 2018-02-16 DIAGNOSIS — C50919 Malignant neoplasm of unspecified site of unspecified female breast: Secondary | ICD-10-CM | POA: Diagnosis not present

## 2018-02-16 DIAGNOSIS — Z853 Personal history of malignant neoplasm of breast: Secondary | ICD-10-CM | POA: Insufficient documentation

## 2018-02-16 MED ORDER — TECHNETIUM TC 99M MEDRONATE IV KIT
20.0000 | PACK | Freq: Once | INTRAVENOUS | Status: AC | PRN
  Administered 2018-02-16: 20.2 via INTRAVENOUS

## 2018-02-23 ENCOUNTER — Other Ambulatory Visit: Payer: Self-pay | Admitting: Oncology
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
## 2018-02-24 ENCOUNTER — Inpatient Hospital Stay: Payer: 59 | Attending: Oncology | Admitting: Oncology

## 2018-02-24 ENCOUNTER — Telehealth: Payer: Self-pay | Admitting: Pharmacist

## 2018-02-24 ENCOUNTER — Telehealth: Payer: Self-pay

## 2018-02-24 ENCOUNTER — Inpatient Hospital Stay: Payer: 59

## 2018-02-24 VITALS — BP 122/55 | HR 88 | Temp 97.5°F | Resp 18 | Ht 65.0 in | Wt 195.0 lb

## 2018-02-24 DIAGNOSIS — Z87891 Personal history of nicotine dependence: Secondary | ICD-10-CM | POA: Diagnosis not present

## 2018-02-24 DIAGNOSIS — C50512 Malignant neoplasm of lower-outer quadrant of left female breast: Secondary | ICD-10-CM

## 2018-02-24 DIAGNOSIS — Z7982 Long term (current) use of aspirin: Secondary | ICD-10-CM

## 2018-02-24 DIAGNOSIS — R918 Other nonspecific abnormal finding of lung field: Secondary | ICD-10-CM

## 2018-02-24 DIAGNOSIS — D0592 Unspecified type of carcinoma in situ of left breast: Secondary | ICD-10-CM | POA: Insufficient documentation

## 2018-02-24 DIAGNOSIS — Z170 Estrogen receptor positive status [ER+]: Secondary | ICD-10-CM

## 2018-02-24 DIAGNOSIS — Z90710 Acquired absence of both cervix and uterus: Secondary | ICD-10-CM | POA: Diagnosis not present

## 2018-02-24 DIAGNOSIS — I824Z2 Acute embolism and thrombosis of unspecified deep veins of left distal lower extremity: Secondary | ICD-10-CM

## 2018-02-24 DIAGNOSIS — Z79899 Other long term (current) drug therapy: Secondary | ICD-10-CM | POA: Diagnosis not present

## 2018-02-24 DIAGNOSIS — I10 Essential (primary) hypertension: Secondary | ICD-10-CM | POA: Diagnosis not present

## 2018-02-24 DIAGNOSIS — Z7901 Long term (current) use of anticoagulants: Secondary | ICD-10-CM | POA: Insufficient documentation

## 2018-02-24 DIAGNOSIS — Z923 Personal history of irradiation: Secondary | ICD-10-CM | POA: Insufficient documentation

## 2018-02-24 DIAGNOSIS — C7951 Secondary malignant neoplasm of bone: Secondary | ICD-10-CM | POA: Insufficient documentation

## 2018-02-24 DIAGNOSIS — G893 Neoplasm related pain (acute) (chronic): Secondary | ICD-10-CM

## 2018-02-24 DIAGNOSIS — Z86718 Personal history of other venous thrombosis and embolism: Secondary | ICD-10-CM | POA: Insufficient documentation

## 2018-02-24 LAB — COMPREHENSIVE METABOLIC PANEL
ALBUMIN: 3.5 g/dL (ref 3.5–5.0)
ALK PHOS: 148 U/L — AB (ref 38–126)
ALT: 15 U/L (ref 0–44)
ANION GAP: 10 (ref 5–15)
AST: 21 U/L (ref 15–41)
BILIRUBIN TOTAL: 0.9 mg/dL (ref 0.3–1.2)
BUN: 11 mg/dL (ref 6–20)
CALCIUM: 9.8 mg/dL (ref 8.9–10.3)
CO2: 27 mmol/L (ref 22–32)
CREATININE: 1.08 mg/dL — AB (ref 0.44–1.00)
Chloride: 108 mmol/L (ref 98–111)
GFR calc Af Amer: >60 mL/min (ref >60)
GFR calc non Af Amer: 56 mL/min — ABNORMAL LOW (ref >60)
Glucose, Bld: 97 mg/dL (ref 70–99)
Potassium: 4 mmol/L (ref 3.5–5.1)
Sodium: 145 mmol/L (ref 135–145)
TOTAL PROTEIN: 7.3 g/dL (ref 6.5–8.1)

## 2018-02-24 LAB — CBC WITH DIFFERENTIAL/PLATELET
Abs Immature Granulocytes: 0.02 10*3/uL (ref 0.00–0.07)
BASOS PCT: 0 %
Basophils Absolute: 0 10*3/uL (ref 0.0–0.1)
EOS ABS: 0.2 10*3/uL (ref 0.0–0.5)
EOS PCT: 2 %
HCT: 41 % (ref 36.0–46.0)
Hemoglobin: 13.2 g/dL (ref 12.0–15.0)
IMMATURE GRANULOCYTES: 0 %
Lymphocytes Relative: 24 %
Lymphs Abs: 1.7 10*3/uL (ref 0.7–4.0)
MCH: 29 pg (ref 26.0–34.0)
MCHC: 32.2 g/dL (ref 30.0–36.0)
MCV: 90.1 fL (ref 80.0–100.0)
MONO ABS: 0.7 10*3/uL (ref 0.1–1.0)
MONOS PCT: 9 %
NEUTROS PCT: 65 %
Neutro Abs: 4.6 10*3/uL (ref 1.7–7.7)
PLATELETS: 246 10*3/uL (ref 150–400)
RBC: 4.55 MIL/uL (ref 3.87–5.11)
RDW: 12.2 % (ref 11.5–15.5)
WBC: 7.2 10*3/uL (ref 4.0–10.5)
nRBC: 0 % (ref 0.0–0.2)

## 2018-02-24 MED ORDER — PALBOCICLIB 125 MG PO CAPS: 125.0000 mg | ORAL_CAPSULE | Freq: Every day | ORAL | 6 refills | Status: DC

## 2018-02-24 MED ORDER — DIAZEPAM 5 MG PO TABS: ORAL_TABLET | ORAL | 0 refills | Status: DC

## 2018-02-24 NOTE — Telephone Encounter (Signed)
Oral Oncology Pharmacist Encounter  Received new prescription for Ibrance (palbociclib) for the treatment of hormone-receptor positive, metastatic breast cancer in conjunction with Faslodex, planned duration until disease progression or unacceptable toxicity.  Original diagnosis of left-sided, hormone-receptor positive, ductal carcinoma in situ in November 2016. Patient was treated with surgery on that time and placed on hormone directed therapy with anastrazole.  Imaging studies performed in Dec 2019 show evidence of recurrence and bone metastases. Patient is now under evaluation to start therapy with Ibrance plus Faslodex.  Patient will be set up for bone marrow biopsy to confirm breast cancer recurrence and assessed for hormone-receptor and Her-2 status. Plan to start Faslodex and Ibrance and 03/17/2018.  Leslee Home will be dosed at '125mg'$  by mouth once daily for 21 days on, 7 days off, and repeated every 28 days. Faslodex will be administered IM at '500mg'$  per dose given on the normal loading and maintenance schedule.  Labs from 02/24/18 assessed, OK for treatment. SCr = 1.08, est CrCl ~ 75 mL/min   Current medication list in Epic reviewed, DDIs with Ibrance identified:  Ibrance and Norco and amlodipine: Category C interactions: Leslee Home is a major substrate of and minor inhibitor of CYP3A4 thereby leading to possible decreased metabolism and increased systemic exposure to both patient's Norco and amlodipine. Patient will be counseled about small increase in risk in somnolence with Norco use. BPs in Epic reviewed, OK for slight increase in amlodipine exposure. No change to current therapy is indicated at this time.  Prescription has been e-scribed to the [facility] by MD for benefits analysis and approval.  Oral Oncology Clinic will continue to follow for insurance authorization, copayment issues, initial counseling and start date.  Johny Drilling, PharmD, BCPS, BCOP   02/24/2018 12:10 PM Oral Oncology Clinic [phone]

## 2018-02-24 NOTE — Telephone Encounter (Signed)
Oral Oncology Patient Advocate Encounter  Prior Authorization for Caitlin Wall has been approved.    PA# [id number] Effective dates: 02/24/18 through 02/25/19  Oral Oncology Clinic will continue to follow.   [name] Phone [phone] Fax [phone]

## 2018-02-24 NOTE — Telephone Encounter (Signed)
Oral Oncology Patient Advocate Encounter  Received notification from [facility] commercial that prior authorization for Caitlin Wall is required.  PA submitted on CoverMyMeds Key [id number] Status is pending  Oral Oncology Clinic will continue to follow.  [place] Patient [place] Phone [phone] Fax [phone]

## 2018-02-24 NOTE — Telephone Encounter (Signed)
Oral Oncology Pharmacist Encounter  I called patient to introduce myself and briefly explain services of oral oncology clinic. Patient informed insurance authorization for Leslee Home has been approved and that it is mandated to be filled at [facility]. Prescription for Leslee Home will be sent to [facility] once we have confirmation of diagnosis from biopsy results. I will update patient once prescription is sent. Patient informed she is eligible for manufacturer copayment coupon due to her commercial prescription insurance coverage so that her out of pocket expenses for Ibrance should be $0.  I will plan to perform initial counseling for Ibrance once diagnosis is confirmed and prescription is sent to dispensing pharmacy.  All questions answered. Patient expressed understanding and is in agreement with above plan. She knows to call the office with any additional questions or concerns.  Johny Drilling, PharmD, BCPS, BCOP  02/24/2018 3:26 PM Oral Oncology Clinic [phone]

## 2018-02-27 ENCOUNTER — Telehealth: Payer: Self-pay | Admitting: Oncology

## 2018-02-27 NOTE — Telephone Encounter (Signed)
Added additional injection appointments per 12/31 schedule message. Patient aware and will get updated schedule at 1/7 visit.

## 2018-02-27 NOTE — Telephone Encounter (Signed)
Spoke with patient re 1/7 bmbx at 7:30 am and lab at 9:30 am. Injection remains scheduled at 10;30 am. Per patient she hasn't started injections yet. Patient informed injection appointment would be left as is and she would have it after lab if it is decided she should proceed with injection.

## 2018-03-03 ENCOUNTER — Inpatient Hospital Stay: Payer: 59

## 2018-03-03 ENCOUNTER — Inpatient Hospital Stay: Payer: 59 | Attending: Oncology

## 2018-03-03 ENCOUNTER — Inpatient Hospital Stay (HOSPITAL_BASED_OUTPATIENT_CLINIC_OR_DEPARTMENT_OTHER): Payer: 59 | Admitting: Adult Health

## 2018-03-03 VITALS — BP 116/58 | HR 81 | Temp 97.5°F | Resp 18

## 2018-03-03 DIAGNOSIS — Z170 Estrogen receptor positive status [ER+]: Secondary | ICD-10-CM

## 2018-03-03 DIAGNOSIS — Z87891 Personal history of nicotine dependence: Secondary | ICD-10-CM | POA: Diagnosis not present

## 2018-03-03 DIAGNOSIS — Z8249 Family history of ischemic heart disease and other diseases of the circulatory system: Secondary | ICD-10-CM | POA: Insufficient documentation

## 2018-03-03 DIAGNOSIS — Z923 Personal history of irradiation: Secondary | ICD-10-CM | POA: Diagnosis not present

## 2018-03-03 DIAGNOSIS — C7951 Secondary malignant neoplasm of bone: Secondary | ICD-10-CM | POA: Insufficient documentation

## 2018-03-03 DIAGNOSIS — C50512 Malignant neoplasm of lower-outer quadrant of left female breast: Secondary | ICD-10-CM

## 2018-03-03 DIAGNOSIS — I10 Essential (primary) hypertension: Secondary | ICD-10-CM | POA: Diagnosis not present

## 2018-03-03 DIAGNOSIS — Z7982 Long term (current) use of aspirin: Secondary | ICD-10-CM | POA: Insufficient documentation

## 2018-03-03 DIAGNOSIS — Z5111 Encounter for antineoplastic chemotherapy: Secondary | ICD-10-CM | POA: Insufficient documentation

## 2018-03-03 DIAGNOSIS — I824Z2 Acute embolism and thrombosis of unspecified deep veins of left distal lower extremity: Secondary | ICD-10-CM

## 2018-03-03 DIAGNOSIS — C7952 Secondary malignant neoplasm of bone marrow: Secondary | ICD-10-CM | POA: Diagnosis not present

## 2018-03-03 DIAGNOSIS — Z7901 Long term (current) use of anticoagulants: Secondary | ICD-10-CM | POA: Diagnosis not present

## 2018-03-03 DIAGNOSIS — M545 Low back pain: Secondary | ICD-10-CM | POA: Insufficient documentation

## 2018-03-03 DIAGNOSIS — Z79899 Other long term (current) drug therapy: Secondary | ICD-10-CM | POA: Insufficient documentation

## 2018-03-03 DIAGNOSIS — C50919 Malignant neoplasm of unspecified site of unspecified female breast: Secondary | ICD-10-CM | POA: Diagnosis not present

## 2018-03-03 DIAGNOSIS — Z86718 Personal history of other venous thrombosis and embolism: Secondary | ICD-10-CM | POA: Diagnosis not present

## 2018-03-03 DIAGNOSIS — G893 Neoplasm related pain (acute) (chronic): Secondary | ICD-10-CM

## 2018-03-03 DIAGNOSIS — R918 Other nonspecific abnormal finding of lung field: Secondary | ICD-10-CM

## 2018-03-03 LAB — COMPREHENSIVE METABOLIC PANEL
ALBUMIN: 3.5 g/dL (ref 3.5–5.0)
ALT: 15 U/L (ref 0–44)
AST: 24 U/L (ref 15–41)
Alkaline Phosphatase: 137 U/L — ABNORMAL HIGH (ref 38–126)
Anion gap: 11 (ref 5–15)
BUN: 11 mg/dL (ref 6–20)
CHLORIDE: 108 mmol/L (ref 98–111)
CO2: 25 mmol/L (ref 22–32)
Calcium: 10 mg/dL (ref 8.9–10.3)
Creatinine, Ser: 1.15 mg/dL — ABNORMAL HIGH (ref 0.44–1.00)
GFR calc Af Amer: 60 mL/min — ABNORMAL LOW (ref >60)
GFR calc non Af Amer: 52 mL/min — ABNORMAL LOW (ref >60)
Glucose, Bld: 135 mg/dL — ABNORMAL HIGH (ref 70–99)
POTASSIUM: 4.3 mmol/L (ref 3.5–5.1)
SODIUM: 144 mmol/L (ref 135–145)
Total Bilirubin: 0.8 mg/dL (ref 0.3–1.2)
Total Protein: 7.2 g/dL (ref 6.5–8.1)

## 2018-03-03 LAB — CBC WITH DIFFERENTIAL/PLATELET
ABS IMMATURE GRANULOCYTES: 0.02 10*3/uL (ref 0.00–0.07)
BASOS PCT: 0 %
Basophils Absolute: 0 10*3/uL (ref 0.0–0.1)
EOS ABS: 0.1 10*3/uL (ref 0.0–0.5)
Eosinophils Relative: 2 %
HCT: 39.9 % (ref 36.0–46.0)
Hemoglobin: 12.9 g/dL (ref 12.0–15.0)
IMMATURE GRANULOCYTES: 0 %
LYMPHS PCT: 23 %
Lymphs Abs: 1.5 10*3/uL (ref 0.7–4.0)
MCH: 29.1 pg (ref 26.0–34.0)
MCHC: 32.3 g/dL (ref 30.0–36.0)
MCV: 90.1 fL (ref 80.0–100.0)
MONO ABS: 0.7 10*3/uL (ref 0.1–1.0)
Monocytes Relative: 11 %
NEUTROS ABS: 4.1 10*3/uL (ref 1.7–7.7)
Neutrophils Relative %: 64 %
PLATELETS: 227 10*3/uL (ref 150–400)
RBC: 4.43 MIL/uL (ref 3.87–5.11)
RDW: 12.5 % (ref 11.5–15.5)
WBC: 6.4 10*3/uL (ref 4.0–10.5)
nRBC: 0 % (ref 0.0–0.2)

## 2018-03-03 MED ORDER — FULVESTRANT 250 MG/5ML IM SOLN
500.0000 mg | Freq: Once | INTRAMUSCULAR | Status: AC
  Administered 2018-03-03: 500 mg via INTRAMUSCULAR

## 2018-03-03 MED ORDER — FULVESTRANT 250 MG/5ML IM SOLN
INTRAMUSCULAR | Status: AC
  Filled 2018-03-03: qty 10

## 2018-03-03 MED ORDER — FULVESTRANT 250 MG/5ML IM SOLN
INTRAMUSCULAR | Status: AC
  Filled 2018-03-03: qty 5

## 2018-03-03 MED ORDER — LIDOCAINE HCL 2 % IJ SOLN
INTRAMUSCULAR | Status: AC
  Filled 2018-03-03: qty 20

## 2018-03-03 MED ORDER — FULVESTRANT 250 MG/5ML IM SOLN: 500.0000 mg | Freq: Once | INTRAMUSCULAR | Status: DC

## 2018-03-03 NOTE — Progress Notes (Signed)
INDICATION: stage IV breast cancer, r/o marrow invovlement    Bone Marrow Biopsy and Aspiration Procedure Note   Informed consent was obtained and potential risks including bleeding, infection and pain were reviewed with the patient.  The patient's name, date of birth, identification, consent and allergies were verified prior to the start of procedure and time out was performed.  The right posterior iliac crest was chosen as the site of biopsy.  The skin was prepped with ChloraPrep.   8 cc of 2% lidocaine was used to provide local anaesthesia.   10 cc of bone marrow aspirate was obtained followed by 1cm biopsy.  Pressure was applied to the biopsy site and bandage was placed over the biopsy site. Patient was made to lie on the back for 30 mins prior to discharge.  The procedure was tolerated well. COMPLICATIONS: None BLOOD LOSS: none The patient was discharged home in stable condition with a 2 week follow up to review results.  Patient was provided with post bone marrow biopsy instructions and instructed to call if there was any bleeding or worsening pain.  Specimens sent for flow cytometry, cytogenetics and additional studies.  Signed Scot Dock, NP

## 2018-03-03 NOTE — Progress Notes (Signed)
Spoke w/ Mendel Ryder, NP. Plan is to give faslodex today (03/03/2018) and then give Xgeva in two weeks.  Demetrius Charity, PharmD, [facility] Pharmacist [facility]acy Phone: [phone] 03/03/2018

## 2018-03-03 NOTE — Patient Instructions (Signed)
Bone Marrow Aspiration and Bone Marrow Biopsy, Adult, Care After This sheet gives you information about how to care for yourself after your procedure. Your health care provider may also give you more specific instructions. If you have problems or questions, contact your health care provider. What can I expect after the procedure? After the procedure, it is common to have:  Mild pain and tenderness.  Swelling.  Bruising. Follow these instructions at home: Puncture site care      Follow instructions from your health care provider about how to take care of the puncture site. Make sure you: ? Wash your hands with soap and water before you change your bandage (dressing). If soap and water are not available, use hand sanitizer. ? Change your dressing as told by your health care provider.  Check your puncture siteevery day for signs of infection. Check for: ? More redness, swelling, or pain. ? More fluid or blood. ? Warmth. ? Pus or a bad smell. General instructions  Take over-the-counter and prescription medicines only as told by your health care provider.  Do not take baths, swim, or use a hot tub until your health care provider approves. Ask if you can take a shower or have a sponge bath.  Return to your normal activities as told by your health care provider. Ask your health care provider what activities are safe for you.  Do not drive for 24 hours if you were given a medicine to help you relax (sedative) during your procedure.  Keep all follow-up visits as told by your health care provider. This is important. Contact a health care provider if:  Your pain is not controlled with medicine. Get help right away if:  You have a fever.  You have more redness, swelling, or pain around the puncture site.  You have more fluid or blood coming from the puncture site.  Your puncture site feels warm to the touch.  You have pus or a bad smell coming from the puncture site. These  symptoms may represent a serious problem that is an emergency. Do not wait to see if the symptoms will go away. Get medical help right away. Call your local emergency services (911 in the [place] Do not drive yourself to the hospital. Summary  After the procedure, it is common to have mild pain, tenderness, swelling, and bruising.  Follow instructions from your health care provider about how to take care of the puncture site.  Get help right away if you have any symptoms of infection or if you have more blood or fluid coming from the puncture site. This information is not intended to replace advice given to you by your health care provider. Make sure you discuss any questions you have with your health care provider. Document Released: 08/31/2004 Document Revised: 05/27/2017 Document Reviewed: 07/26/2015 Elsevier Interactive Patient Education  2019 [place] injection What is this medicine? FULVESTRANT (ful VES trant) blocks the effects of estrogen. It is used to treat breast cancer. This medicine may be used for other purposes; ask your health care provider or pharmacist if you have questions. COMMON BRAND NAME(S): FASLODEX What should I tell my health care provider before I take this medicine? They need to know if you have any of these conditions: -bleeding disorders -liver disease -low blood counts, like low white cell, platelet, or red cell counts -an unusual or allergic reaction to fulvestrant, other medicines, foods, dyes, or preservatives -pregnant or trying to get pregnant -breast-feeding How should I use  this medicine? This medicine is for injection into a muscle. It is usually given by a health care professional in a hospital or clinic setting. Talk to your pediatrician regarding the use of this medicine in children. Special care may be needed. Overdosage: If you think you have taken too much of this medicine contact a poison control center or emergency room at  once. NOTE: This medicine is only for you. Do not share this medicine with others. What if I miss a dose? It is important not to miss your dose. Call your doctor or health care professional if you are unable to keep an appointment. What may interact with this medicine? -medicines that treat or prevent blood clots like warfarin, enoxaparin, dalteparin, apixaban, dabigatran, and rivaroxaban This list may not describe all possible interactions. Give your health care provider a list of all the medicines, herbs, non-prescription drugs, or dietary supplements you use. Also tell them if you smoke, drink alcohol, or use illegal drugs. Some items may interact with your medicine. What should I watch for while using this medicine? Your condition will be monitored carefully while you are receiving this medicine. You will need important blood work done while you are taking this medicine. Do not become pregnant while taking this medicine or for at least 1 year after stopping it. Women of child-bearing potential will need to have a negative pregnancy test before starting this medicine. Women should inform their doctor if they wish to become pregnant or think they might be pregnant. There is a potential for serious side effects to an unborn child. Men should inform their doctors if they wish to father a child. This medicine may lower sperm counts. Talk to your health care professional or pharmacist for more information. Do not breast-feed an infant while taking this medicine or for 1 year after the last dose. What side effects may I notice from receiving this medicine? Side effects that you should report to your doctor or health care professional as soon as possible: -allergic reactions like skin rash, itching or hives, swelling of the face, lips, or tongue -feeling faint or lightheaded, falls -pain, tingling, numbness, or weakness in the legs -signs and symptoms of infection like fever or chills; cough; flu-like  symptoms; sore throat -vaginal bleeding Side effects that usually do not require medical attention (report to your doctor or health care professional if they continue or are bothersome): -aches, pains -constipation -diarrhea -headache -hot flashes -nausea, vomiting -pain at site where injected -stomach pain This list may not describe all possible side effects. Call your doctor for medical advice about side effects. You may report side effects to FDA at 1-800-FDA-1088. Where should I keep my medicine? This drug is given in a hospital or clinic and will not be stored at home. NOTE: This sheet is a summary. It may not cover all possible information. If you have questions about this medicine, talk to your doctor, pharmacist, or health care provider.  2019 Elsevier/Gold Standard (2017-05-22 11:34:41)

## 2018-03-04 LAB — CANCER ANTIGEN 27.29: CA 27.29: 37.9 U/mL (ref 0.0–38.6)

## 2018-03-05 ENCOUNTER — Other Ambulatory Visit: Payer: Self-pay | Admitting: Oncology

## 2018-03-05 ENCOUNTER — Ambulatory Visit (HOSPITAL_COMMUNITY): Payer: 59

## 2018-03-05 ENCOUNTER — Telehealth: Payer: Self-pay | Admitting: *Deleted

## 2018-03-05 ENCOUNTER — Ambulatory Visit (HOSPITAL_COMMUNITY)
Admission: RE | Admit: 2018-03-05 | Discharge: 2018-03-05 | Disposition: A | Discharge status: Home / Self Care | Payer: 59 | Source: Ambulatory Visit | Attending: Oncology | Admitting: Oncology

## 2018-03-05 ENCOUNTER — Encounter (HOSPITAL_COMMUNITY): Payer: Self-pay

## 2018-03-05 ENCOUNTER — Other Ambulatory Visit: Payer: Self-pay | Admitting: *Deleted

## 2018-03-05 DIAGNOSIS — Z170 Estrogen receptor positive status [ER+]: Secondary | ICD-10-CM

## 2018-03-05 DIAGNOSIS — C50512 Malignant neoplasm of lower-outer quadrant of left female breast: Secondary | ICD-10-CM | POA: Insufficient documentation

## 2018-03-05 DIAGNOSIS — C7951 Secondary malignant neoplasm of bone: Secondary | ICD-10-CM

## 2018-03-05 DIAGNOSIS — G44001 Cluster headache syndrome, unspecified, intractable: Secondary | ICD-10-CM

## 2018-03-05 DIAGNOSIS — R918 Other nonspecific abnormal finding of lung field: Secondary | ICD-10-CM

## 2018-03-05 NOTE — Telephone Encounter (Signed)
Per MD requisition for Molecular studies on recent BMBX [facility]ed to [facility].

## 2018-03-05 NOTE — Progress Notes (Signed)
I was called by Dr. Melina Copa to tell me that the bone marrow biopsy on Caitlin Wall is positive.  We requested a prognostic panel.  We are also going to be sending it to [facility]  I called Caitlin Wall and gave her the news.  She was not able to have a regular MRI done and we are rescheduling her brain MRI to a "open" MRI next week.

## 2018-03-07 ENCOUNTER — Ambulatory Visit (HOSPITAL_COMMUNITY): Admission: RE | Admit: 2018-03-07 | Payer: 59 | Source: Ambulatory Visit

## 2018-03-10 ENCOUNTER — Telehealth: Payer: Self-pay | Admitting: *Deleted

## 2018-03-10 ENCOUNTER — Telehealth: Payer: Self-pay | Admitting: Pharmacist

## 2018-03-10 ENCOUNTER — Other Ambulatory Visit: Payer: Self-pay | Admitting: Adult Health

## 2018-03-10 DIAGNOSIS — Z170 Estrogen receptor positive status [ER+]: Principal | ICD-10-CM

## 2018-03-10 DIAGNOSIS — C50512 Malignant neoplasm of lower-outer quadrant of left female breast: Secondary | ICD-10-CM

## 2018-03-10 MED ORDER — PALBOCICLIB 125 MG PO CAPS: 125.0000 mg | ORAL_CAPSULE | Freq: Every day | ORAL | 6 refills | Status: DC

## 2018-03-10 NOTE — Telephone Encounter (Signed)
Oral Chemotherapy Pharmacist Encounter   I spoke with patient for overview of: Ibrance (palbociclib) for the treatment of hormone-receptor positive, metastatic breast cancer in conjunction with Faslodex, planned duration until disease progression or unacceptable toxicity.   Counseled patient on administration, dosing, side effects, monitoring, drug-food interactions, safe handling, storage, and disposal.  Patient will take Ibrance 125mg  capsules, 1 capsule by mouth once daily after breakfast for 3 weeks on, 1 week off.  Patient knows to avoid grapefruit and grapefruit juice.  Patient is receiving Faslodex injections.  Ibrance start date: 03/17/2018  Adverse effects include but are not limited to: fatigue, hair loss, GI upset, nausea, decreased blood counts, and increased upper respiratory infections. Severe, life-threatening, and/or fatal interstitial lung disease (ILD) and/or pneumonitis may occur with CDK 4/6 inhibitors.  Patient will obtain anti diarrheal and alert the office of 4 or more loose stools above baseline.  Patient reminded of WBC check on Cycle 1 Day 14 for dose and ANC assessment.  Reviewed with patient importance of keeping a medication schedule and plan for any missed doses.  Caitlin Wall voiced understanding and appreciation.   All questions answered. Medication reconciliation performed and medication/allergy list updated.  Patient informed that prescription has been e-scribed to [facility] per insurance requirement. Patient informed she is eligible for manufacturer copayment coupon for Ibrance if her copayment due at the pharmacy is not affordable. I have requested the pharmacy secure this for her.   I have confirmed office visits on 1/21/220 with patient.  Patient with questions about open MRI. A note has been sent to MD and collaborative practice RN.  Patient knows to call the office with questions or concerns. Oral Oncology Clinic will  continue to follow.  Caitlin Wall, PharmD, BCPS, BCOP  03/10/2018   11:05 AM Oral Oncology Clinic [phone]

## 2018-03-10 NOTE — Telephone Encounter (Signed)
This RN contacted [facility] [place] per need for open MRI and scheduled for 03/17/2018 at 8 am.  Pt states she needs totally open MRI and she has been to [facility] before.  Note she is in the process of being restaged for recurrent progressive breast cancer and had MRI's scheduled last week which she was unable to proceed due to the MRI at [facility].  Above called and informed pt.  Orders faxed to [phone]

## 2018-03-11 ENCOUNTER — Telehealth: Payer: Self-pay | Admitting: Oncology

## 2018-03-11 ENCOUNTER — Telehealth: Payer: Self-pay | Admitting: *Deleted

## 2018-03-11 NOTE — Telephone Encounter (Signed)
R/s appt per 1/15 sch message - pt is aware of appt date and time

## 2018-03-11 NOTE — Telephone Encounter (Signed)
This RN was contacted by the pt who stated scheduled time for MRI's " is the same as my appointments in your office " " which should I reschedule ?"  This RN informed pt best to reschedule appointments at this office due to scans are special studies that may be harder to reschedule.  Pt verbalized understanding.  Urgent message sent to scheduling due to need to open pt's schedule for studies to be done.

## 2018-03-12 ENCOUNTER — Encounter (HOSPITAL_COMMUNITY): Payer: Self-pay | Admitting: Oncology

## 2018-03-16 ENCOUNTER — Telehealth: Payer: Self-pay

## 2018-03-16 NOTE — Telephone Encounter (Signed)
Oral Oncology Patient Advocate Encounter  Carley Hammed has been trying to get in touch with the patient to set up shipment of her Ibrance. I called the patient and gave her [place] phone number to call them, I also let her know her copay would be $35. I was able to secure her a copay card to make her out of pocket cost $0. This copay card information is as follows and has been shared with [facility].  BIN: [id number] GRP: [id number] ID: [id number]  The patient verbalized understanding and great appreciation.  [place] [facility] [place] Phone [phone] Fax [phone]

## 2018-03-17 ENCOUNTER — Ambulatory Visit: Payer: 59

## 2018-03-17 ENCOUNTER — Ambulatory Visit: Payer: 59 | Admitting: Adult Health

## 2018-03-17 DIAGNOSIS — G44001 Cluster headache syndrome, unspecified, intractable: Secondary | ICD-10-CM | POA: Diagnosis not present

## 2018-03-17 DIAGNOSIS — C50919 Malignant neoplasm of unspecified site of unspecified female breast: Secondary | ICD-10-CM | POA: Diagnosis not present

## 2018-03-18 ENCOUNTER — Encounter: Payer: Self-pay | Admitting: Adult Health

## 2018-03-18 ENCOUNTER — Inpatient Hospital Stay (HOSPITAL_BASED_OUTPATIENT_CLINIC_OR_DEPARTMENT_OTHER): Payer: 59 | Admitting: Adult Health

## 2018-03-18 ENCOUNTER — Inpatient Hospital Stay: Payer: 59

## 2018-03-18 ENCOUNTER — Telehealth: Payer: Self-pay | Admitting: Adult Health

## 2018-03-18 VITALS — BP 132/75 | HR 84 | Temp 98.3°F | Resp 18 | Ht 65.0 in | Wt 196.2 lb

## 2018-03-18 DIAGNOSIS — Z8249 Family history of ischemic heart disease and other diseases of the circulatory system: Secondary | ICD-10-CM

## 2018-03-18 DIAGNOSIS — C50512 Malignant neoplasm of lower-outer quadrant of left female breast: Secondary | ICD-10-CM

## 2018-03-18 DIAGNOSIS — Z7982 Long term (current) use of aspirin: Secondary | ICD-10-CM

## 2018-03-18 DIAGNOSIS — I10 Essential (primary) hypertension: Secondary | ICD-10-CM | POA: Diagnosis not present

## 2018-03-18 DIAGNOSIS — G893 Neoplasm related pain (acute) (chronic): Secondary | ICD-10-CM

## 2018-03-18 DIAGNOSIS — C7951 Secondary malignant neoplasm of bone: Secondary | ICD-10-CM | POA: Diagnosis not present

## 2018-03-18 DIAGNOSIS — Z170 Estrogen receptor positive status [ER+]: Secondary | ICD-10-CM

## 2018-03-18 DIAGNOSIS — Z87891 Personal history of nicotine dependence: Secondary | ICD-10-CM

## 2018-03-18 DIAGNOSIS — Z79899 Other long term (current) drug therapy: Secondary | ICD-10-CM

## 2018-03-18 DIAGNOSIS — Z5111 Encounter for antineoplastic chemotherapy: Secondary | ICD-10-CM | POA: Diagnosis not present

## 2018-03-18 DIAGNOSIS — M545 Low back pain: Secondary | ICD-10-CM

## 2018-03-18 DIAGNOSIS — Z86718 Personal history of other venous thrombosis and embolism: Secondary | ICD-10-CM

## 2018-03-18 DIAGNOSIS — Z7901 Long term (current) use of anticoagulants: Secondary | ICD-10-CM

## 2018-03-18 DIAGNOSIS — Z923 Personal history of irradiation: Secondary | ICD-10-CM

## 2018-03-18 MED ORDER — FULVESTRANT 250 MG/5ML IM SOLN
INTRAMUSCULAR | Status: AC
  Filled 2018-03-18: qty 5

## 2018-03-18 MED ORDER — DENOSUMAB 120 MG/1.7ML ~~LOC~~ SOLN
120.0000 mg | Freq: Once | SUBCUTANEOUS | Status: AC
  Administered 2018-03-18: 120 mg via SUBCUTANEOUS

## 2018-03-18 MED ORDER — DENOSUMAB 120 MG/1.7ML ~~LOC~~ SOLN
SUBCUTANEOUS | Status: AC
  Filled 2018-03-18: qty 1.7

## 2018-03-18 MED ORDER — FULVESTRANT 250 MG/5ML IM SOLN
500.0000 mg | Freq: Once | INTRAMUSCULAR | Status: AC
  Administered 2018-03-18: 500 mg via INTRAMUSCULAR

## 2018-03-18 NOTE — Telephone Encounter (Signed)
Per 1/21 no los °

## 2018-03-18 NOTE — Progress Notes (Signed)
Caitlin Wall  [phone] Fax:([phone]) [phone]     ID: Caitlin Wall DOB: April 09, 1955  MR#: [id number]  EYC#:[id number]  Patient Care Team: Biagio Borg, MD as PCP - General Tamala Julian Lynnell Dike, MD as PCP - Cardiology (Cardiology) Magrinat, Virgie Dad, MD as Consulting Physician (Oncology) Kyung Rudd, MD as Consulting Physician (Radiation Oncology) Jovita Kussmaul, MD as Consulting Physician (General Surgery) Sylvan Cheese, NP as Nurse Practitioner (Hematology and Oncology) PCP: Biagio Borg, MD SU: Star Age MD OTHER MD: Keturah Barre MD, Kyung Rudd MD  CHIEF COMPLAINT: Ductal carcinoma in situ  CURRENT TREATMENT: Biopsy pending   BREAST CANCER HISTORY:  From the original intake note:   Caitlin Wall [facility]. She has subpectoral saline implants in place. There was a possible asymmetry in the left breast and she was recalled for left diagnostic mammography with tomosynthesis and left breast ultrasonography 11/03/2014. The breast density was category C. In the left breast lower outer quadrant there was an irregular mass which was not palpable and which by ultrasonography measured 0.6 cm. The left axilla was sonographically benign. Biopsy of this mass 11/09/2014 showed ([id number]) fibroadipose adipose tissue. This was felt to be discordant.  Accordingly the patient was referred to surgery and she underwent left breast radioactive seed localized lumpectomy 12/28/2014. The pathology from that procedure (([id number]) showed ductal carcinoma in situ, high-grade, measuring 0.2 cm. This was less than 0.1 cm from the posterior margin. The cells was estrogen receptor positive at 95%, with strong staining intensity, progesterone receptor positive at 30% with moderate staining intensity.  Her subsequent history is as detailed below.   INTERVAL HISTORY: Caitlin Wall returns today for follow-up of her estrogen  receptor positive breast cancer. She is accompanied by her husband.  Since her last visit, she underwent bone marrow biopsy on 03/03/2018 that showed metastatic carcinoma consistent with breast primary, Estrogen Receptor 95% positive, Progesterone Receptor 10% positive.    She also underwent MRI abdomen and Brain yesterday.  There was no evidence of liver or brain involvement in either test.  These test results were viewed in [place] as she had them done at [facility].    Caitlin Wall was started on Fulvestrant on 03/03/2018 and she noted some injection site soreness, but otherwise tolerated it well. She will start Xgeva today, and the Palbociclib should be mailed to her today as well.     REVIEW OF SYSTEMS: Caitlin Wall is feeling well for the most part.  She does have some intermittent lower back pain.  She denies any focal weakness.  She isn't sure what to take for the back pain.  She denies any bowel/bladder changes, nausea, vomiting.  She is without chest pain, palpitations, cough, or shortness of breath.  A detailed ROS was otherwise non contributory.     PAST MEDICAL HISTORY: Past Medical History:  Diagnosis Date  . Asthma, persistent not controlled 07/27/2012  . Breast cancer ([place]) 12/28/14   Left BresastDCIS  . COLONIC POLYPS, HX OF 11/04/2007  . COPD (chronic obstructive pulmonary disease) ([place])   . GERD 11/04/2007  . GERD (gastroesophageal reflux disease)   . HAIR LOSS 11/04/2007  . Hx of cardiac cath 11/15/2016   a. LHC 11/15/16 showed normal coronaries and EF 50-55% with normal EDP. (done for false positive NST)  . Hx of colonic polyps 1995   adenomatous  . Hyperlipidemia   . HYPERLIPIDEMIA 11/04/2007  . Hypertension   .  HYPERTENSION 06/07/2009  . LBBB (left bundle branch block) 07/26/2011  . Personal history of radiation therapy   . S/P radiation therapy 02/02/15-03/24/15   left breast 62.4GY    PAST SURGICAL HISTORY: Past Surgical History:  Procedure Laterality Date  . ABDOMINAL  HYSTERECTOMY    . ANTERIOR FUSION CERVICAL SPINE    . AUGMENTATION MAMMAPLASTY Bilateral   . bilat ear surgury/hearing loss    . BREAST LUMPECTOMY Left 2016  . BREAST LUMPECTOMY WITH RADIOACTIVE SEED LOCALIZATION Left 12/28/2014   Procedure: BREAST LUMPECTOMY WITH RADIOACTIVE SEED LOCALIZATION;  Surgeon: Autumn Messing III, MD;  Location: [facility];  Service: General;  Laterality: Left;  . COLONOSCOPY    . LEFT HEART CATH AND CORONARY ANGIOGRAPHY N/A 11/15/2016   Procedure: LEFT HEART CATH AND CORONARY ANGIOGRAPHY;  Surgeon: Belva Crome, MD;  Location: [facility];  Service: Cardiovascular;  Laterality: N/A;  . POLYPECTOMY      FAMILY HISTORY Family History  Problem Relation Age of Onset  . Breast cancer Other   . Cancer Paternal Grandmother        breast  . Lung cancer Father   . Breast cancer Maternal Grandmother   . Healthy Mother   . Coronary artery disease Neg Hx   . Colon cancer Neg Hx   . Pancreatic cancer Neg Hx   . Rectal cancer Neg Hx   . Stomach cancer Neg Hx   . Esophageal cancer Neg Hx    [name] died from lung cancer in the setting of tobacco abuse at age 27. [name] died from "old age" at age 57. The patient had one brother, 3 sisters. The brother had" I cancer" requiring enucleation--the patient does not know whether this was a primary ocular melanoma. [name] was diagnosed with breast cancer but the patient does not know at what age. There is no other history of breast or ovarian cancer in the family   GYNECOLOGIC HISTORY:  No LMP recorded. Patient has had a hysterectomy. Menarche age 77, first live birth age 45. The patient is GX P3. She is status post hysterectomy without salpingo-oophorectomy. She did not take hormone replacement. She did use oral contraceptives remotely without any complications.   SOCIAL HISTORY:  Caitlin Wall works in accounts payable. Her husband [name] is disabled because of  back problems. [name] is a Marine scientist working in rehabilitation in [place]. [name] lives in [place] where he works as a Games developer. [name] lives in [place] where he works as a Emergency planning/management officer. The patient has 4 grandchildren. She is not a Ambulance person.    ADVANCED DIRECTIVES: Not in place   HEALTH MAINTENANCE: Social History   Tobacco Use  . Smoking status: Former Smoker    Packs/day: 1.00    Years: 20.00    Pack years: 20.00    Types: Cigarettes    Last attempt to quit: 08/19/1989    Years since quitting: 28.5  . Smokeless tobacco: Never Used  Substance Use Topics  . Alcohol use: No  . Drug use: No    Colonoscopy:  PAP:  Bone density:  Lipid panel:  Allergies  Allergen Reactions  . Codeine     REACTION: nausea,    Current Outpatient Medications  Medication Sig Dispense Refill  . amLODipine (NORVASC) 5 MG tablet Take 1 tablet (5 mg total) by mouth daily. 90 tablet 3  . aspirin EC 81 MG tablet Take 81 mg by mouth daily.    Marland Kitchen  HYDROcodone-acetaminophen (NORCO) 7.5-325 MG tablet Take 1 tablet by mouth every 6 (six) hours as needed for moderate pain. 30 tablet 0  . losartan (COZAAR) 100 MG tablet Take 1 tablet (100 mg total) by mouth daily. 90 tablet 3  . Multiple Vitamin (MULTIVITAMIN WITH MINERALS) TABS tablet Take 1 tablet by mouth daily.    . palbociclib (IBRANCE) 125 MG capsule Take 1 capsule (125 mg total) by mouth daily. Take after lunch. Take for 21 days on, 7 days off, repeat every 28 days. 21 capsule 6  . Vitamin D, Ergocalciferol, (DRISDOL) 1.25 MG (50000 UT) CAPS capsule Take 1 capsule (50,000 Units total) by mouth every 7 (seven) days. 12 capsule 0  . diazepam (VALIUM) 5 MG tablet Take one tablet before MRI; may repeat times 1 (Patient not taking: Reported on 03/18/2018) 10 tablet 0   No current facility-administered medications for this visit.     OBJECTIVE:  Vitals:   03/18/18 0945  BP: 132/75  Pulse: 84  Resp: 18  Temp:  98.3 F (36.8 C)  SpO2: 99%     Body mass index is 32.65 kg/m.    ECOG FS:1 - Symptomatic but completely ambulatory GENERAL: Patient is a well appearing female in no acute distress HEENT:  Sclerae anicteric.  Oropharynx clear and moist. No ulcerations or evidence of oropharyngeal candidiasis. Neck is supple.  NODES:  No cervical, supraclavicular, or axillary lymphadenopathy palpated.  BREAST EXAM:  Deferred. LUNGS:  Clear to auscultation bilaterally.  No wheezes or rhonchi. HEART:  Regular rate and rhythm. No murmur appreciated. ABDOMEN:  Soft, nontender.  Positive, normoactive bowel sounds. No organomegaly palpated. MSK:  No focal spinal tenderness to palpation. Full range of motion bilaterally in the upper extremities. EXTREMITIES:  No peripheral edema.   SKIN:  Clear with no obvious rashes or skin changes. No nail dyscrasia. NEURO:  Nonfocal. Well oriented.  Appropriate affect.    LAB RESULTS:  CMP     Component Value Date/Time   NA 144 03/03/2018 0847   NA 141 04/25/2017 1213   NA 145 07/04/2016 1133   K 4.3 03/03/2018 0847   K 4.2 07/04/2016 1133   CL 108 03/03/2018 0847   CO2 25 03/03/2018 0847   CO2 28 07/04/2016 1133   GLUCOSE 135 (H) 03/03/2018 0847   GLUCOSE 101 07/04/2016 1133   BUN 11 03/03/2018 0847   BUN 18 04/25/2017 1213   BUN 16.0 07/04/2016 1133   CREATININE 1.15 (H) 03/03/2018 0847   CREATININE 1.53 (H) 04/28/2017 1442   CREATININE 0.9 07/04/2016 1133   CALCIUM 10.0 03/03/2018 0847   CALCIUM 9.9 07/04/2016 1133   PROT 7.2 03/03/2018 0847   PROT 7.3 07/04/2016 1133   ALBUMIN 3.5 03/03/2018 0847   ALBUMIN 3.8 07/04/2016 1133   AST 24 03/03/2018 0847   AST 27 04/28/2017 1442   AST 19 07/04/2016 1133   ALT 15 03/03/2018 0847   ALT 41 04/28/2017 1442   ALT 17 07/04/2016 1133   ALKPHOS 137 (H) 03/03/2018 0847   ALKPHOS 172 (H) 07/04/2016 1133   BILITOT 0.8 03/03/2018 0847   BILITOT 0.7 04/28/2017 1442   BILITOT 0.94 07/04/2016 1133   GFRNONAA 52  (L) 03/03/2018 0847   GFRNONAA 36 (L) 04/28/2017 1442   GFRAA 60 (L) 03/03/2018 0847   GFRAA 42 (L) 04/28/2017 1442    INo results found for: SPEP, UPEP  Lab Results  Component Value Date   WBC 6.4 03/03/2018   NEUTROABS 4.1 03/03/2018   HGB  12.9 03/03/2018   HCT 39.9 03/03/2018   MCV 90.1 03/03/2018   PLT 227 03/03/2018      Chemistry      Component Value Date/Time   NA 144 03/03/2018 0847   NA 141 04/25/2017 1213   NA 145 07/04/2016 1133   K 4.3 03/03/2018 0847   K 4.2 07/04/2016 1133   CL 108 03/03/2018 0847   CO2 25 03/03/2018 0847   CO2 28 07/04/2016 1133   BUN 11 03/03/2018 0847   BUN 18 04/25/2017 1213   BUN 16.0 07/04/2016 1133   CREATININE 1.15 (H) 03/03/2018 0847   CREATININE 1.53 (H) 04/28/2017 1442   CREATININE 0.9 07/04/2016 1133      Component Value Date/Time   CALCIUM 10.0 03/03/2018 0847   CALCIUM 9.9 07/04/2016 1133   ALKPHOS 137 (H) 03/03/2018 0847   ALKPHOS 172 (H) 07/04/2016 1133   AST 24 03/03/2018 0847   AST 27 04/28/2017 1442   AST 19 07/04/2016 1133   ALT 15 03/03/2018 0847   ALT 41 04/28/2017 1442   ALT 17 07/04/2016 1133   BILITOT 0.8 03/03/2018 0847   BILITOT 0.7 04/28/2017 1442   BILITOT 0.94 07/04/2016 1133     No results found for: LABCA2  No components found for: LABCA125  No results for input(s): INR in the last 168 hours.  Urinalysis    Component Value Date/Time   COLORURINE YELLOW 10/20/2017 [place] 10/20/2017 1303   LABSPEC 1.020 10/20/2017 1303   PHURINE 6.0 10/20/2017 1303   GLUCOSEU NEGATIVE 10/20/2017 1303   HGBUR TRACE-INTACT (A) 10/20/2017 1303   BILIRUBINUR NEGATIVE 10/20/2017 1303   KETONESUR NEGATIVE 10/20/2017 1303   PROTEINUR NEGATIVE 05/12/2017 0054   UROBILINOGEN 1.0 10/20/2017 1303   NITRITE NEGATIVE 10/20/2017 1303   LEUKOCYTESUR TRACE (A) 10/20/2017 1303    STUDIES: Nm Bone Scan Whole Body  Result Date: 02/16/2018 CLINICAL DATA:  Invasive breast cancer restaging, sternal  fracture question question pathologic EXAM: NUCLEAR MEDICINE WHOLE BODY BONE SCAN TECHNIQUE: Whole body anterior and posterior images were obtained approximately 3 hours after intravenous injection of radiopharmaceutical. RADIOPHARMACEUTICALS:  20.2 mCi Technetium-14mMDP IV COMPARISON:  None Correlation CT chest 02/06/2018 FINDINGS: Multiple sites of abnormal osseous tracer accumulation are identified suspicious for osseous metastatic disease. These include calvarium, thoracic spine, and multiple RIGHT ribs and questionably RIGHT iliac crest laterally. Increased tracer accumulation is also seen at the sternal fracture seen previously, suspicious for underlying pathologic lesion by prior CT, unable to differentiate by scintigraphy. Uptake at the shoulders and knees, typically degenerative. No additional sites of abnormal osseous tracer accumulation are identified. Expected urinary tract and soft tissue distribution of tracer. IMPRESSION: Abnormal tracer localization at multiple foci in the calvarium, RIGHT ribs, thoracic spine, and questionably RIGHT iliac bone consistent with osseous metastatic disease. Intense focal uptake at the sternal fracture identified by prior CT; unable to differentiate traumatic versus pathologic fractures by scintigraphy but lesion appeared suspicious for underlying metastatic disease by CT. Electronically Signed   By: MLavonia DanaM.D.   On: 02/16/2018 15:29    ASSESSMENT: 63y.o. [place] woman status post left lumpectomy 12/28/2014 for ductal carcinoma in situ, high-grade measuring 0.2 cm, estrogen and progesterone receptor positive, with close but negative margins  (1) adjuvant radiation completed 03/24/2015  (2) anastrozole started 04/19/2015   (a) bone density 03/01/2016 shows a T score of -1.2  (3) mildly abnormal hepatic function panel: negative workup for hepatitis B and C, normal alpha-1-anti-trypsin  (  4) left lower extremity DVT diagnosed by Doppler  ultrasonography 04/16/2016, with negative CT angiogram chest  (a) on Xarelto starting 04/16/2016   (b) negative hypercoagulable panel, with a minimally abnormal (indeterminate) anti-cardiolipin IgM  ((c) stopped rivaroxaban 07/16/2016, repeat D-Dimer WNL August 2018  METASTATIC DISEASE: December 2019 (5) CT chest 02/06/2018 shows a mixed lytic and sclerotic process with fracture at the inferior manubrium but no other bone lesions.  However bone scan 02/16/2018 shows multiple bone lesions consistent with metastatic disease  (a) Bone marrow biopsy on 03/03/2018 shows metastatic carcinoma consistent with breast primary, ER positive, PR positive  (b) MRI abdomen and MRI brain at [facility] (results viewable in care everywhere) notes known bone involvement in spine and pelvis, but no other area of metastatic disease in abdomen/pelvis, or brain.  (6) to start denosumab/Xgeva on 03/18/2018  (7) to start fulvestrant on 03/03/2018 and palbociclib 03/18/2018  PLAN Caitlin Wall is doing well today.  We reviewed her pathology results from 1/7 confirming the metastatic breast cancer ER/PR positive in the bone marrow.  I let her know that these results confirm her diagnosis and she will continue with her treatment plan including Fulvestrant, Xgeva, and Palbociclib.    We reviewed that the soreness of the fulvestrant does happen and this did resolve after a couple of days.  We reviewed the Xgeva in detail.  We reviewed common side effects such as hypocalcemia, bone achiness after receiving it.  I recommended she increase her calcium intake, particularly on the day of her xgeva injection. She was recommended to take Tums on injection day.  We also reviewed foods and beverages that are high in calcium.  We reviewed the black box warning of Xgeva being osteonecrosis of the jaw.  She is up to date with her dental exams and has no current dental concerns/needs.  We reviewed that she should let her dentist know she is  receiving Xgeva and that she should contact us/let us know if she has any dental issues and/or if she needs any upcoming dental work so that Caitlin Wall can be held if needed.  She will receive the Xgeva today and then again in 6 weeks.  I reviewed the fact that her last lab work was done on 03/03/2018 with Dr. Jana Hakim.  He reviewed her labs from this date and notes that she can proceed with the Caitlin Wall today based on those labs.  There are no additional labs needed today.    We again reviewed the Palbociclib.  She was able to name most of the common side effects of the Palbociclib such as neutropenia.  I let her know that fatigue is also a common side effect of the treatment, and that exercise can help a lot of times with fatigue.  She will start the Palbociclib today.    Mayme and I reviewed her MRIs in detail.  The MRI of the abdomen again shows the metastatic disease in the spine/pelvis, but there is no other metastatic disease in the abdomen/pelvis, or brain.  This is good news.    Lastly, we reviewed her lower back pain.  She was recommended to take one aleve and one extra strength tylenol together 2-3 times per day as needed for her pain.  She will let us know how that is going at her next appointment, or will call us if she notes it is worsening.    Caitlin Wall will return in two weeks for labs, f/u with Dr. Jana Hakim, and her next Fulvestrant injection.  She  knows to call for any other issue that may develop before then.  A total of (30) minutes of face-to-face time was spent with this patient with greater than 50% of that time in counseling and care-coordination.   Wilber Bihari, NP 03/18/18 10:36 AM Medical Oncology and Hematology [facility] [address] [place], [place] [address] Tel. [phone]    Fax. [phone]   .

## 2018-03-18 NOTE — Patient Instructions (Signed)
Fulvestrant injection What is this medicine? FULVESTRANT (ful VES trant) blocks the effects of estrogen. It is used to treat breast cancer. This medicine may be used for other purposes; ask your health care provider or pharmacist if you have questions. COMMON BRAND NAME(S): FASLODEX What should I tell my health care provider before I take this medicine? They need to know if you have any of these conditions: -bleeding disorders -liver disease -low blood counts, like low white cell, platelet, or red cell counts -an unusual or allergic reaction to fulvestrant, other medicines, foods, dyes, or preservatives -pregnant or trying to get pregnant -breast-feeding How should I use this medicine? This medicine is for injection into a muscle. It is usually given by a health care professional in a hospital or clinic setting. Talk to your pediatrician regarding the use of this medicine in children. Special care may be needed. Overdosage: If you think you have taken too much of this medicine contact a poison control center or emergency room at once. NOTE: This medicine is only for you. Do not share this medicine with others. What if I miss a dose? It is important not to miss your dose. Call your doctor or health care professional if you are unable to keep an appointment. What may interact with this medicine? -medicines that treat or prevent blood clots like warfarin, enoxaparin, dalteparin, apixaban, dabigatran, and rivaroxaban This list may not describe all possible interactions. Give your health care provider a list of all the medicines, herbs, non-prescription drugs, or dietary supplements you use. Also tell them if you smoke, drink alcohol, or use illegal drugs. Some items may interact with your medicine. What should I watch for while using this medicine? Your condition will be monitored carefully while you are receiving this medicine. You will need important blood work done while you are taking this  medicine. Do not become pregnant while taking this medicine or for at least 1 year after stopping it. Women of child-bearing potential will need to have a negative pregnancy test before starting this medicine. Women should inform their doctor if they wish to become pregnant or think they might be pregnant. There is a potential for serious side effects to an unborn child. Men should inform their doctors if they wish to father a child. This medicine may lower sperm counts. Talk to your health care professional or pharmacist for more information. Do not breast-feed an infant while taking this medicine or for 1 year after the last dose. What side effects may I notice from receiving this medicine? Side effects that you should report to your doctor or health care professional as soon as possible: -allergic reactions like skin rash, itching or hives, swelling of the face, lips, or tongue -feeling faint or lightheaded, falls -pain, tingling, numbness, or weakness in the legs -signs and symptoms of infection like fever or chills; cough; flu-like symptoms; sore throat -vaginal bleeding Side effects that usually do not require medical attention (report to your doctor or health care professional if they continue or are bothersome): -aches, pains -constipation -diarrhea -headache -hot flashes -nausea, vomiting -pain at site where injected -stomach pain This list may not describe all possible side effects. Call your doctor for medical advice about side effects. You may report side effects to FDA at 1-800-FDA-1088. Where should I keep my medicine? This drug is given in a hospital or clinic and will not be stored at home. NOTE: This sheet is a summary. It may not cover all possible information. If you  have questions about this medicine, talk to your doctor, pharmacist, or health care provider.  2019 [employer] Standard (2017-05-22 11:34:41) Denosumab injection What is this medicine? DENOSUMAB (den oh  sue mab) slows bone breakdown. Prolia is used to treat osteoporosis in women after menopause and in men, and in people who are taking corticosteroids for 6 months or more. Delton See is used to treat a high calcium level due to cancer and to prevent bone fractures and other bone problems caused by multiple myeloma or cancer bone metastases. Delton See is also used to treat giant cell tumor of the bone. This medicine may be used for other purposes; ask your health care provider or pharmacist if you have questions. COMMON BRAND NAME(S): Prolia, XGEVA What should I tell my health care provider before I take this medicine? They need to know if you have any of these conditions: -dental disease -having surgery or tooth extraction -infection -kidney disease -low levels of calcium or Vitamin D in the blood -malnutrition -on hemodialysis -skin conditions or sensitivity -thyroid or parathyroid disease -an unusual reaction to denosumab, other medicines, foods, dyes, or preservatives -pregnant or trying to get pregnant -breast-feeding How should I use this medicine? This medicine is for injection under the skin. It is given by a health care professional in a hospital or clinic setting. A special MedGuide will be given to you before each treatment. Be sure to read this information carefully each time. For Prolia, talk to your pediatrician regarding the use of this medicine in children. Special care may be needed. For Delton See, talk to your pediatrician regarding the use of this medicine in children. While this drug may be prescribed for children as young as 13 years for selected conditions, precautions do apply. Overdosage: If you think you have taken too much of this medicine contact a poison control center or emergency room at once. NOTE: This medicine is only for you. Do not share this medicine with others. What if I miss a dose? It is important not to miss your dose. Call your doctor or health care professional if  you are unable to keep an appointment. What may interact with this medicine? Do not take this medicine with any of the following medications: -other medicines containing denosumab This medicine may also interact with the following medications: -medicines that lower your chance of fighting infection -steroid medicines like prednisone or cortisone This list may not describe all possible interactions. Give your health care provider a list of all the medicines, herbs, non-prescription drugs, or dietary supplements you use. Also tell them if you smoke, drink alcohol, or use illegal drugs. Some items may interact with your medicine. What should I watch for while using this medicine? Visit your doctor or health care professional for regular checks on your progress. Your doctor or health care professional may order blood tests and other tests to see how you are doing. Call your doctor or health care professional for advice if you get a fever, chills or sore throat, or other symptoms of a cold or flu. Do not treat yourself. This drug may decrease your body's ability to fight infection. Try to avoid being around people who are sick. You should make sure you get enough calcium and vitamin D while you are taking this medicine, unless your doctor tells you not to. Discuss the foods you eat and the vitamins you take with your health care professional. See your dentist regularly. Brush and floss your teeth as directed. Before you have any dental work  dental work done, tell your dentist you are receiving this medicine. Do not become pregnant while taking this medicine or for 5 months after stopping it. Talk with your doctor or health care professional about your birth control options while taking this medicine. Women should inform their doctor if they wish to become pregnant or think they might be pregnant. There is a potential for serious side effects to an unborn child. Talk to your health care professional or pharmacist for  more information. What side effects may I notice from receiving this medicine? Side effects that you should report to your doctor or health care professional as soon as possible: -allergic reactions like skin rash, itching or hives, swelling of the face, lips, or tongue -bone pain -breathing problems -dizziness -jaw pain, especially after dental work -redness, blistering, peeling of the skin -signs and symptoms of infection like fever or chills; cough; sore throat; pain or trouble passing urine -signs of low calcium like fast heartbeat, muscle cramps or muscle pain; pain, tingling, numbness in the hands or feet; seizures -unusual bleeding or bruising -unusually weak or tired Side effects that usually do not require medical attention (report to your doctor or health care professional if they continue or are bothersome): -constipation -diarrhea -headache -joint pain -loss of appetite -muscle pain -runny nose -tiredness -upset stomach This list may not describe all possible side effects. Call your doctor for medical advice about side effects. You may report side effects to FDA at 1-800-FDA-1088. Where should I keep my medicine? This medicine is only given in a clinic, doctor's office, or other health care setting and will not be stored at home. NOTE: This sheet is a summary. It may not cover all possible information. If you have questions about this medicine, talk to your doctor, pharmacist, or health care provider.  2019 [employer]/Gold Standard (2017-06-20 16:10:44)  

## 2018-03-19 NOTE — Telephone Encounter (Addendum)
Oral Oncology Patient Advocate Encounter  I confirmed with [facility] that the [facility] was delivered to the patient today with a $0 copay using a copay card.  [facility] [place] Phone [phone] Fax [phone]

## 2018-03-23 ENCOUNTER — Ambulatory Visit
Admission: RE | Admit: 2018-03-23 | Discharge: 2018-03-23 | Disposition: A | Discharge status: Home / Self Care | Payer: 59 | Source: Ambulatory Visit | Attending: Oncology | Admitting: Oncology

## 2018-03-23 DIAGNOSIS — Z780 Asymptomatic menopausal state: Secondary | ICD-10-CM | POA: Diagnosis not present

## 2018-03-23 DIAGNOSIS — M85851 Other specified disorders of bone density and structure, right thigh: Secondary | ICD-10-CM | POA: Diagnosis not present

## 2018-03-23 DIAGNOSIS — Z170 Estrogen receptor positive status [ER+]: Principal | ICD-10-CM

## 2018-03-23 DIAGNOSIS — C50512 Malignant neoplasm of lower-outer quadrant of left female breast: Secondary | ICD-10-CM

## 2018-03-23 DIAGNOSIS — I824Z2 Acute embolism and thrombosis of unspecified deep veins of left distal lower extremity: Secondary | ICD-10-CM

## 2018-03-25 ENCOUNTER — Other Ambulatory Visit: Payer: Self-pay | Admitting: Oncology

## 2018-03-30 NOTE — Progress Notes (Signed)
Caitlin Wall  [phone] [phone]     ID: Caitlin Wall DOB: 29-May-1955  MR#: [id number]  BSJ#:[id number]  Patient Care Team: Biagio Borg, MD as PCP - General Tamala Julian Lynnell Dike, MD as PCP - Cardiology (Cardiology) Magrinat, Virgie Dad, MD as Consulting Physician (Oncology) Kyung Rudd, MD as Consulting Physician (Radiation Oncology) Jovita Kussmaul, MD as Consulting Physician (General Surgery) Sylvan Cheese, NP as Nurse Practitioner (Hematology and Oncology) PCP: Biagio Borg, MD SU: Star Age MD OTHER MD: Keturah Barre MD, Kyung Rudd MD  CHIEF COMPLAINT: Metastatic estrogen receptor positive breast cancer  CURRENT TREATMENT: Denosumab/Xgeva, fluvestrant, palbociclib   BREAST CANCER HISTORY:  From the original intake note:   Caitlin Wall [facility]. She has subpectoral saline implants in place. There was a possible asymmetry in the left breast and she was recalled for left diagnostic mammography with tomosynthesis and left breast ultrasonography 11/03/2014. The breast density was category C. In the left breast lower outer quadrant there was an irregular mass which was not palpable and which by ultrasonography measured 0.6 cm. The left axilla was sonographically benign. Biopsy of this mass 11/09/2014 showed ([id number]) fibroadipose adipose tissue. This was felt to be discordant.  Accordingly the patient was referred to surgery and she underwent left breast radioactive seed localized lumpectomy 12/28/2014. The pathology from that procedure ((SZA [id number]) showed ductal carcinoma in situ, high-grade, measuring 0.2 cm. This was less than 0.1 cm from the posterior margin. The cells was estrogen receptor positive at 95%, with strong staining intensity, progesterone receptor positive at 30% with moderate staining intensity.  Her subsequent history is as detailed below.   INTERVAL  HISTORY: Caitlin Wall returns today for follow-up and treatment of her estrogen receptor positive breast cancer. She is accompanied by her husband.  She continues on denosumab/Xgeva. Her first dose was received on 03/18/2018.   She also continues on fluvestrant. Her most recent dose was received on 03/18/2018. Sore a few days after the shots. She was not treating this pain.    Finally, she also continues on palbociclib. She started this on the 03/18/2018; she is started her third week today. She has no nausea and no fatigue.   Since her last visit here, she underwent a bone density screening on 03/23/2018, showing a T-score of -1/7, which is considered osteopenic.      REVIEW OF SYSTEMS: Deanette is woken up some nights from pain, mostly in her chest, back, and bilateral hips (right more than left). She will take ibuprofen to treat with some relief. She has constipation; she had been taking stool softener, but was unsure if she should take it with all of the other medications she has been on. Her husband mentions that she is staying short of breath, which she can walk from the car to the house and be out of breath. The patient denies unusual headaches, visual changes, nausea, vomiting, or dizziness. There has been no unusual cough, phlegm production, or pleurisy. This been no change in bladder habits. The patient denies unexplained fatigue or unexplained weight loss, bleeding, rash, or fever. A detailed review of systems was otherwise noncontributory.    PAST MEDICAL HISTORY: Past Medical History:  Diagnosis Date  . Asthma, persistent not controlled 07/27/2012  . Breast cancer ([place]) 12/28/14   Left BresastDCIS  . COLONIC POLYPS, HX OF 11/04/2007  . COPD (chronic obstructive pulmonary disease) ([place])   . GERD 11/04/2007  .  GERD (gastroesophageal reflux disease)   . HAIR LOSS 11/04/2007  . Hx of cardiac cath 11/15/2016   a. LHC 11/15/16 showed normal coronaries and EF 50-55% with normal EDP. (done for false  positive NST)  . Hx of colonic polyps 1995   adenomatous  . Hyperlipidemia   . HYPERLIPIDEMIA 11/04/2007  . Hypertension   . HYPERTENSION 06/07/2009  . LBBB (left bundle branch block) 07/26/2011  . Personal history of radiation therapy   . S/P radiation therapy 02/02/15-03/24/15   left breast 62.4GY    PAST SURGICAL HISTORY: Past Surgical History:  Procedure Laterality Date  . ABDOMINAL HYSTERECTOMY    . ANTERIOR FUSION CERVICAL SPINE    . AUGMENTATION MAMMAPLASTY Bilateral   . bilat ear surgury/hearing loss    . BREAST LUMPECTOMY Left 2016  . BREAST LUMPECTOMY WITH RADIOACTIVE SEED LOCALIZATION Left 12/28/2014   Procedure: BREAST LUMPECTOMY WITH RADIOACTIVE SEED LOCALIZATION;  Surgeon: Autumn Messing III, MD;  Location: [facility];  Service: General;  Laterality: Left;  . COLONOSCOPY    . LEFT HEART CATH AND CORONARY ANGIOGRAPHY N/A 11/15/2016   Procedure: LEFT HEART CATH AND CORONARY ANGIOGRAPHY;  Surgeon: Belva Crome, MD;  Location: [facility];  Service: Cardiovascular;  Laterality: N/A;  . POLYPECTOMY      FAMILY HISTORY Family History  Problem Relation Age of Onset  . Breast cancer Other   . Cancer Paternal Grandmother        breast  . Lung cancer Father   . Breast cancer Maternal Grandmother   . Healthy Mother   . Coronary artery disease Neg Hx   . Colon cancer Neg Hx   . Pancreatic cancer Neg Hx   . Rectal cancer Neg Hx   . Stomach cancer Neg Hx   . Esophageal cancer Neg Hx    [name] died from lung cancer in the setting of tobacco abuse at age 84. [name] died from "old age" at age 44. The patient had one brother, 3 sisters. The brother had" I cancer" requiring enucleation--the patient does not know whether this was a primary ocular melanoma. [name] was diagnosed with breast cancer but the patient does not know at what age. There is no other history of breast or ovarian cancer in the  family   GYNECOLOGIC HISTORY:  No LMP recorded. Patient has had a hysterectomy. Menarche age 63, first live birth age 11. The patient is GX P3. She is status post hysterectomy without salpingo-oophorectomy. She did not take hormone replacement. She did use oral contraceptives remotely without any complications.   SOCIAL HISTORY:  Rihanna works in accounts payable. Her husband [name] is disabled because of back problems. [name] is a Marine scientist working in rehabilitation in [place]. [name] lives in [place] where he works as a Games developer. [name] lives in [place] where he works as a Emergency planning/management officer. The patient has 4 grandchildren. She is not a Ambulance person.    ADVANCED DIRECTIVES: Not in place   HEALTH MAINTENANCE: Social History   Tobacco Use  . Smoking status: Former Smoker    Packs/day: 1.00    Years: 20.00    Pack years: 20.00    Types: Cigarettes    Last attempt to quit: 08/19/1989    Years since quitting: 28.6  . Smokeless tobacco: Never Used  Substance Use Topics  . Alcohol use: No  . Drug use: No    Colonoscopy:  PAP:  Bone density:  Lipid panel:  Allergies  Allergen Reactions  . Codeine     REACTION: nausea,    Current Outpatient Medications  Medication Sig Dispense Refill  . amLODipine (NORVASC) 5 MG tablet Take 1 tablet (5 mg total) by mouth daily. 90 tablet 3  . aspirin EC 81 MG tablet Take 81 mg by mouth daily.    Marland Kitchen losartan (COZAAR) 100 MG tablet Take 1 tablet (100 mg total) by mouth daily. 90 tablet 3  . Multiple Vitamin (MULTIVITAMIN WITH MINERALS) TABS tablet Take 1 tablet by mouth daily.    . palbociclib (IBRANCE) 125 MG capsule Take 1 capsule (125 mg total) by mouth daily. Take after lunch. Take for 21 days on, 7 days off, repeat every 28 days. 21 capsule 6  . traMADol (ULTRAM) 50 MG tablet Take 1-2 tablets (50-100 mg total) by mouth every 6 (six) hours as needed. 60 tablet 0  . Vitamin D, Ergocalciferol, (DRISDOL) 1.25 MG  (50000 UT) CAPS capsule Take 1 capsule (50,000 Units total) by mouth every 7 (seven) days. 12 capsule 0   No current facility-administered medications for this visit.     OBJECTIVE: Middle-aged white woman who appears stated age 63:   03/31/18 0816  BP: (!) 122/56  Pulse: 84  Resp: 16  Temp: 98.5 F (36.9 C)  SpO2: 98%     Body mass index is 32.67 kg/m.    ECOG FS:1 - Symptomatic but completely ambulatory  Sclerae unicteric, EOMs intact No cervical or supraclavicular adenopathy Lungs no rales or rhonchi Heart regular rate and rhythm Abd soft, nontender, positive bowel sounds MSK mild mid thoracic spinal tenderness, no upper extremity lymphedema Neuro: nonfocal, well oriented, appropriate affect Breasts: Deferred    LAB RESULTS:  CMP     Component Value Date/Time   NA 144 03/03/2018 0847   NA 141 04/25/2017 1213   NA 145 07/04/2016 1133   K 4.3 03/03/2018 0847   K 4.2 07/04/2016 1133   CL 108 03/03/2018 0847   CO2 25 03/03/2018 0847   CO2 28 07/04/2016 1133   GLUCOSE 135 (H) 03/03/2018 0847   GLUCOSE 101 07/04/2016 1133   BUN 11 03/03/2018 0847   BUN 18 04/25/2017 1213   BUN 16.0 07/04/2016 1133   CREATININE 1.15 (H) 03/03/2018 0847   CREATININE 1.53 (H) 04/28/2017 1442   CREATININE 0.9 07/04/2016 1133   CALCIUM 10.0 03/03/2018 0847   CALCIUM 9.9 07/04/2016 1133   PROT 7.2 03/03/2018 0847   PROT 7.3 07/04/2016 1133   ALBUMIN 3.5 03/03/2018 0847   ALBUMIN 3.8 07/04/2016 1133   AST 24 03/03/2018 0847   AST 27 04/28/2017 1442   AST 19 07/04/2016 1133   ALT 15 03/03/2018 0847   ALT 41 04/28/2017 1442   ALT 17 07/04/2016 1133   ALKPHOS 137 (H) 03/03/2018 0847   ALKPHOS 172 (H) 07/04/2016 1133   BILITOT 0.8 03/03/2018 0847   BILITOT 0.7 04/28/2017 1442   BILITOT 0.94 07/04/2016 1133   GFRNONAA 52 (L) 03/03/2018 0847   GFRNONAA 36 (L) 04/28/2017 1442   GFRAA 60 (L) 03/03/2018 0847   GFRAA 42 (L) 04/28/2017 1442    INo results found for: SPEP,  UPEP  Lab Results  Component Value Date   WBC 6.4 03/03/2018   NEUTROABS 4.1 03/03/2018   HGB 12.9 03/03/2018   HCT 39.9 03/03/2018   MCV 90.1 03/03/2018   PLT 227 03/03/2018      Chemistry      Component Value Date/Time  NA 144 03/03/2018 0847   NA 141 04/25/2017 1213   NA 145 07/04/2016 1133   K 4.3 03/03/2018 0847   K 4.2 07/04/2016 1133   CL 108 03/03/2018 0847   CO2 25 03/03/2018 0847   CO2 28 07/04/2016 1133   BUN 11 03/03/2018 0847   BUN 18 04/25/2017 1213   BUN 16.0 07/04/2016 1133   CREATININE 1.15 (H) 03/03/2018 0847   CREATININE 1.53 (H) 04/28/2017 1442   CREATININE 0.9 07/04/2016 1133      Component Value Date/Time   CALCIUM 10.0 03/03/2018 0847   CALCIUM 9.9 07/04/2016 1133   ALKPHOS 137 (H) 03/03/2018 0847   ALKPHOS 172 (H) 07/04/2016 1133   AST 24 03/03/2018 0847   AST 27 04/28/2017 1442   AST 19 07/04/2016 1133   ALT 15 03/03/2018 0847   ALT 41 04/28/2017 1442   ALT 17 07/04/2016 1133   BILITOT 0.8 03/03/2018 0847   BILITOT 0.7 04/28/2017 1442   BILITOT 0.94 07/04/2016 1133     No results found for: LABCA2  No components found for: LABCA125  No results for input(s): INR in the last 168 hours.  Urinalysis    Component Value Date/Time   COLORURINE YELLOW 10/20/2017 [place] 10/20/2017 1303   LABSPEC 1.020 10/20/2017 1303   PHURINE 6.0 10/20/2017 1303   GLUCOSEU NEGATIVE 10/20/2017 1303   HGBUR TRACE-INTACT (A) 10/20/2017 1303   BILIRUBINUR NEGATIVE 10/20/2017 1303   KETONESUR NEGATIVE 10/20/2017 1303   PROTEINUR NEGATIVE 05/12/2017 0054   UROBILINOGEN 1.0 10/20/2017 1303   NITRITE NEGATIVE 10/20/2017 1303   LEUKOCYTESUR TRACE (A) 10/20/2017 1303    STUDIES: Dg Bone Density  Result Date: 03/23/2018 EXAM: DUAL X-RAY ABSORPTIOMETRY (DXA) FOR BONE MINERAL DENSITY IMPRESSION: Referring Physician:  Chauncey Cruel Your patient completed a BMD test using Lunar IDXA DXA system ( analysis version: 16 ) manufactured by [employer]. Technologist: KT PATIENT: Name: Kieley, Akter Patient ID: [id number] Birth Date: 1955/09/05 Height: 64.0 in. Sex: Female Measured: 03/23/2018 Weight: 192.2 lbs. Indications: Anastrazole, Breast Cancer History, Caucasian, Estrogen Deficient, Family Hist. (Parent hip fracture), Hysterectomy, Postmenopausal, Secondary Osteoporosis Fractures: Foot Treatments: Hormone Therapy For Cancer, Vitamin D (E933.5) ASSESSMENT: The BMD measured at Femur Neck Right is 0.802 g/cm2 with a T-score of -1.7. This patient is considered OSTEOPENIC according to [facility]) criteria. There has been no statistically significant change in BMD of Total Mean and there has been a statistically significant decrease in BMD of right hip since prior exam dated 03/01/2016. The scan quality is good. L3 and L4 were excluded due to degenerative changes. Site Region Measured Date Measured Age YA T-score BMD Significant CHANGE DualFemur Neck Right 03/23/2018 61.0 -1.7 0.802 g/cm2 * DualFemur Neck Right 03/01/2016    58.9         -1.2    0.869 g/cm2 AP Spine  L1-L2      03/23/2018    61.0         0.3     1.196 g/cm2 AP Spine  L1-L2      03/01/2016    58.9         -0.1    1.158 g/cm2 DualFemur Total Mean 03/23/2018    61.0         -0.5    0.939 g/cm2 DualFemur Total Mean 03/01/2016    58.9         -0.6    0.937 g/cm2 World Health Organization [facility]) criteria for post-menopausal,  Caucasian Women: Normal       T-score at or above -1 SD Osteopenia   T-score between -1 and -2.5 SD Osteoporosis T-score at or below -2.5 SD RECOMMENDATION: 1. All patients should optimize calcium and vitamin D intake. 2. Consider FDA approved medical therapies in postmenopausal women and men aged 54 years and older, based on the following: a. A hip or vertebral (clinical or morphometric) fracture b. T- score < or = -2.5 at the femoral neck or spine after appropriate evaluation to exclude secondary causes c. Low bone mass (T-score between -1.0 and  -2.5 at the femoral neck or spine) and a 10 year probability of a hip fracture > or = 3% or a 10 year probability of a major osteoporosis-related fracture > or = 20% based on the [place]-adapted WHO algorithm d. Clinician judgment and/or patient preferences may indicate treatment for people with 10-year fracture probabilities above or below these levels FOLLOW-UP: People with diagnosed cases of osteoporosis or at high risk for fracture should have regular bone mineral density tests. For patients eligible for Medicare, routine testing is allowed once every 2 years. The testing frequency can be increased to one year for patients who have rapidly progressing disease, those who are receiving or discontinuing medical therapy to restore bone mass, or have additional risk factors. I have reviewed this report and agree with the above findings. Mark A. Thornton Papas, M.D. [facility] FRAX* 10-year Probability of Fracture Based on femoral neck BMD: DualFemur (Right) Major Osteoporotic Fracture: 16.3% Hip Fracture:                0.9% Population:                  [place] (Caucasian) Risk Factors: Family Hist. (Parent hip fracture), Secondary Osteoporosis *FRAX is a Materials engineer of the [employer] of [employer] for Metabolic Bone Disease, a Hilda (WHO) [facility]. ASSESSMENT: The probability of a major osteoporotic fracture is 16.3 % within the next ten years. The probability of hip fracture is 0.9 % within the next 10 years. I have reviewed this report and agree with the above findings. Mark A. Thornton Papas, M.D. [facility] Electronically Signed   By: Lavonia Dana M.D.   On: 03/23/2018 08:44    ASSESSMENT: 63 y.o. [place] woman status post left lumpectomy 12/28/2014 for ductal carcinoma in situ, high-grade measuring 0.2 cm, estrogen and progesterone receptor positive, with close but negative margins  (1) adjuvant radiation completed 03/24/2015  (2) anastrozole started  04/19/2015   (a) bone density 03/01/2016 shows a T score of -1.2  (3) mildly abnormal hepatic function panel: negative workup for hepatitis B and C, normal alpha-1-anti-trypsin  (4) left lower extremity DVT diagnosed by Doppler ultrasonography 04/16/2016, with negative CT angiogram chest  (a) on Xarelto starting 04/16/2016   (b) negative hypercoagulable panel, with a minimally abnormal (indeterminate) anti-cardiolipin IgM  ((c) stopped rivaroxaban 07/16/2016, repeat D-Dimer WNL August 2018  METASTATIC DISEASE: December 2019: bone only (5) CT chest 02/06/2018 shows a mixed lytic and sclerotic process with fracture at the inferior manubrium but no other bone lesions.  However bone scan 02/16/2018 shows multiple bone lesions consistent with metastatic disease; no lung or pleura involvement, no adenopathy  (a) Bone marrow biopsy on 03/03/2018 shows metastatic carcinoma consistent with breast primary, estrogen and progesterone receptor strongly positive, HER-2 not amplified (1+)  (b) MRI abdomen and MRI brain at [facility] (results viewable in "care everywhere") notes known bone involvement in  spine and pelvis, but no other area of metastatic disease in abdomen/pelvis, or brain.  (c) CA 27.29 not informative (37.9 on 03/03/2018)  (6) started denosumab/Xgeva on 03/18/2018, repeated Q28 d  (7) started fulvestrant on 03/03/2018 and palbociclib 03/18/2018   PLAN Neah is tolerating her therapy for metastatic disease well.  She will receive her third fulvestrant dose today and from this point she will receive it on an every 28-day cycle.  She is also receiving denosumab/Xgeva every 28 days, with her first dose on 01 22, and her next dose will be with her next palbociclib cycle on 04/28/2018.  After that it will be every 28 days.  She had no significant side effects from that  She is tolerating the palbociclib or Ibrance with no side effects also.  We will continue to follow her counts closely  She  again had many questions regarding why this did not show on her lab work, why it took so long to find it and so want.  I tried to reassure her as best I could but the fact this this tumor was occult and had been present already in her bones at the time of her initial breast cancer diagnosis.  It just took that long to manifest itself  We are going to see her again on 0303 just to make sure her questions are addressed.  After that we will see her with every other dose.  It would be probably a good idea to restage her sometime before the May visit.  Note that she is scheduled for a trip in July to the [place] and that will not conflict with her treatment doses  As far as pain is concerned she is going to use ibuprofen and Tylenol as needed and I have written for tramadol that may be added as necessary.  We also discussed bowel prophylaxis issues today  She knows to call for any other problems that may develop before the next visit.   Magrinat, Virgie Dad, MD  03/31/18 9:25 AM Medical Oncology and Hematology [facility] [address] [place], [place] [address] Tel. [phone]    Fax. [phone]  I, Jacqualyn Posey am acting as a Education administrator for Chauncey Cruel, MD.   I, Lurline Del MD, have reviewed the above documentation for accuracy and completeness, and I agree with the above.   Marland Kitchen

## 2018-03-31 ENCOUNTER — Inpatient Hospital Stay: Payer: 59

## 2018-03-31 ENCOUNTER — Inpatient Hospital Stay: Payer: 59 | Attending: Oncology | Admitting: Oncology

## 2018-03-31 ENCOUNTER — Telehealth: Payer: Self-pay | Admitting: Oncology

## 2018-03-31 VITALS — BP 122/56 | HR 84 | Temp 98.5°F | Resp 16 | Ht 65.0 in | Wt 196.3 lb

## 2018-03-31 DIAGNOSIS — Z86718 Personal history of other venous thrombosis and embolism: Secondary | ICD-10-CM | POA: Diagnosis not present

## 2018-03-31 DIAGNOSIS — Z7982 Long term (current) use of aspirin: Secondary | ICD-10-CM | POA: Diagnosis not present

## 2018-03-31 DIAGNOSIS — Z923 Personal history of irradiation: Secondary | ICD-10-CM

## 2018-03-31 DIAGNOSIS — Z87891 Personal history of nicotine dependence: Secondary | ICD-10-CM | POA: Insufficient documentation

## 2018-03-31 DIAGNOSIS — Z801 Family history of malignant neoplasm of trachea, bronchus and lung: Secondary | ICD-10-CM | POA: Diagnosis not present

## 2018-03-31 DIAGNOSIS — C50512 Malignant neoplasm of lower-outer quadrant of left female breast: Secondary | ICD-10-CM

## 2018-03-31 DIAGNOSIS — M8580 Other specified disorders of bone density and structure, unspecified site: Secondary | ICD-10-CM | POA: Diagnosis not present

## 2018-03-31 DIAGNOSIS — Z7901 Long term (current) use of anticoagulants: Secondary | ICD-10-CM | POA: Diagnosis not present

## 2018-03-31 DIAGNOSIS — C7951 Secondary malignant neoplasm of bone: Secondary | ICD-10-CM | POA: Diagnosis not present

## 2018-03-31 DIAGNOSIS — Z803 Family history of malignant neoplasm of breast: Secondary | ICD-10-CM | POA: Diagnosis not present

## 2018-03-31 DIAGNOSIS — Z79899 Other long term (current) drug therapy: Secondary | ICD-10-CM | POA: Diagnosis not present

## 2018-03-31 DIAGNOSIS — Z5111 Encounter for antineoplastic chemotherapy: Secondary | ICD-10-CM | POA: Diagnosis not present

## 2018-03-31 DIAGNOSIS — Z170 Estrogen receptor positive status [ER+]: Secondary | ICD-10-CM | POA: Diagnosis not present

## 2018-03-31 DIAGNOSIS — I10 Essential (primary) hypertension: Secondary | ICD-10-CM | POA: Diagnosis not present

## 2018-03-31 DIAGNOSIS — G893 Neoplasm related pain (acute) (chronic): Secondary | ICD-10-CM | POA: Diagnosis not present

## 2018-03-31 MED ORDER — TRAMADOL HCL 50 MG PO TABS: 50.0000 mg | ORAL_TABLET | Freq: Four times a day (QID) | ORAL | 0 refills | Status: DC | PRN

## 2018-03-31 MED ORDER — FULVESTRANT 250 MG/5ML IM SOLN
500.0000 mg | Freq: Once | INTRAMUSCULAR | Status: AC
  Administered 2018-03-31: 500 mg via INTRAMUSCULAR

## 2018-03-31 NOTE — Telephone Encounter (Signed)
Gave avs and calendar ° °

## 2018-03-31 NOTE — Patient Instructions (Signed)
Fulvestrant injection What is this medicine? FULVESTRANT (ful VES trant) blocks the effects of estrogen. It is used to treat breast cancer. This medicine may be used for other purposes; ask your health care provider or pharmacist if you have questions. COMMON BRAND NAME(S): FASLODEX What should I tell my health care provider before I take this medicine? They need to know if you have any of these conditions: -bleeding disorders -liver disease -low blood counts, like low white cell, platelet, or red cell counts -an unusual or allergic reaction to fulvestrant, other medicines, foods, dyes, or preservatives -pregnant or trying to get pregnant -breast-feeding How should I use this medicine? This medicine is for injection into a muscle. It is usually given by a health care professional in a hospital or clinic setting. Talk to your pediatrician regarding the use of this medicine in children. Special care may be needed. Overdosage: If you think you have taken too much of this medicine contact a poison control center or emergency room at once. NOTE: This medicine is only for you. Do not share this medicine with others. What if I miss a dose? It is important not to miss your dose. Call your doctor or health care professional if you are unable to keep an appointment. What may interact with this medicine? -medicines that treat or prevent blood clots like warfarin, enoxaparin, dalteparin, apixaban, dabigatran, and rivaroxaban This list may not describe all possible interactions. Give your health care provider a list of all the medicines, herbs, non-prescription drugs, or dietary supplements you use. Also tell them if you smoke, drink alcohol, or use illegal drugs. Some items may interact with your medicine. What should I watch for while using this medicine? Your condition will be monitored carefully while you are receiving this medicine. You will need important blood work done while you are taking this  medicine. Do not become pregnant while taking this medicine or for at least 1 year after stopping it. Women of child-bearing potential will need to have a negative pregnancy test before starting this medicine. Women should inform their doctor if they wish to become pregnant or think they might be pregnant. There is a potential for serious side effects to an unborn child. Men should inform their doctors if they wish to father a child. This medicine may lower sperm counts. Talk to your health care professional or pharmacist for more information. Do not breast-feed an infant while taking this medicine or for 1 year after the last dose. What side effects may I notice from receiving this medicine? Side effects that you should report to your doctor or health care professional as soon as possible: -allergic reactions like skin rash, itching or hives, swelling of the face, lips, or tongue -feeling faint or lightheaded, falls -pain, tingling, numbness, or weakness in the legs -signs and symptoms of infection like fever or chills; cough; flu-like symptoms; sore throat -vaginal bleeding Side effects that usually do not require medical attention (report to your doctor or health care professional if they continue or are bothersome): -aches, pains -constipation -diarrhea -headache -hot flashes -nausea, vomiting -pain at site where injected -stomach pain This list may not describe all possible side effects. Call your doctor for medical advice about side effects. You may report side effects to FDA at 1-800-FDA-1088. Where should I keep my medicine? This drug is given in a hospital or clinic and will not be stored at home. NOTE: This sheet is a summary. It may not cover all possible information. If you  have questions about this medicine, talk to your doctor, pharmacist, or health care provider.  2019 [employer] Standard (2017-05-22 11:34:41) Denosumab injection What is this medicine? DENOSUMAB (den oh  sue mab) slows bone breakdown. Prolia is used to treat osteoporosis in women after menopause and in men, and in people who are taking corticosteroids for 6 months or more. Delton See is used to treat a high calcium level due to cancer and to prevent bone fractures and other bone problems caused by multiple myeloma or cancer bone metastases. Delton See is also used to treat giant cell tumor of the bone. This medicine may be used for other purposes; ask your health care provider or pharmacist if you have questions. COMMON BRAND NAME(S): Prolia, XGEVA What should I tell my health care provider before I take this medicine? They need to know if you have any of these conditions: -dental disease -having surgery or tooth extraction -infection -kidney disease -low levels of calcium or Vitamin D in the blood -malnutrition -on hemodialysis -skin conditions or sensitivity -thyroid or parathyroid disease -an unusual reaction to denosumab, other medicines, foods, dyes, or preservatives -pregnant or trying to get pregnant -breast-feeding How should I use this medicine? This medicine is for injection under the skin. It is given by a health care professional in a hospital or clinic setting. A special MedGuide will be given to you before each treatment. Be sure to read this information carefully each time. For Prolia, talk to your pediatrician regarding the use of this medicine in children. Special care may be needed. For Delton See, talk to your pediatrician regarding the use of this medicine in children. While this drug may be prescribed for children as young as 13 years for selected conditions, precautions do apply. Overdosage: If you think you have taken too much of this medicine contact a poison control center or emergency room at once. NOTE: This medicine is only for you. Do not share this medicine with others. What if I miss a dose? It is important not to miss your dose. Call your doctor or health care professional if  you are unable to keep an appointment. What may interact with this medicine? Do not take this medicine with any of the following medications: -other medicines containing denosumab This medicine may also interact with the following medications: -medicines that lower your chance of fighting infection -steroid medicines like prednisone or cortisone This list may not describe all possible interactions. Give your health care provider a list of all the medicines, herbs, non-prescription drugs, or dietary supplements you use. Also tell them if you smoke, drink alcohol, or use illegal drugs. Some items may interact with your medicine. What should I watch for while using this medicine? Visit your doctor or health care professional for regular checks on your progress. Your doctor or health care professional may order blood tests and other tests to see how you are doing. Call your doctor or health care professional for advice if you get a fever, chills or sore throat, or other symptoms of a cold or flu. Do not treat yourself. This drug may decrease your body's ability to fight infection. Try to avoid being around people who are sick. You should make sure you get enough calcium and vitamin D while you are taking this medicine, unless your doctor tells you not to. Discuss the foods you eat and the vitamins you take with your health care professional. See your dentist regularly. Brush and floss your teeth as directed. Before you have any dental work  dental work done, tell your dentist you are receiving this medicine. Do not become pregnant while taking this medicine or for 5 months after stopping it. Talk with your doctor or health care professional about your birth control options while taking this medicine. Women should inform their doctor if they wish to become pregnant or think they might be pregnant. There is a potential for serious side effects to an unborn child. Talk to your health care professional or pharmacist for  more information. What side effects may I notice from receiving this medicine? Side effects that you should report to your doctor or health care professional as soon as possible: -allergic reactions like skin rash, itching or hives, swelling of the face, lips, or tongue -bone pain -breathing problems -dizziness -jaw pain, especially after dental work -redness, blistering, peeling of the skin -signs and symptoms of infection like fever or chills; cough; sore throat; pain or trouble passing urine -signs of low calcium like fast heartbeat, muscle cramps or muscle pain; pain, tingling, numbness in the hands or feet; seizures -unusual bleeding or bruising -unusually weak or tired Side effects that usually do not require medical attention (report to your doctor or health care professional if they continue or are bothersome): -constipation -diarrhea -headache -joint pain -loss of appetite -muscle pain -runny nose -tiredness -upset stomach This list may not describe all possible side effects. Call your doctor for medical advice about side effects. You may report side effects to FDA at 1-800-FDA-1088. Where should I keep my medicine? This medicine is only given in a clinic, doctor's office, or other health care setting and will not be stored at home. NOTE: This sheet is a summary. It may not cover all possible information. If you have questions about this medicine, talk to your doctor, pharmacist, or health care provider.  2019 [employer]/Gold Standard (2017-06-20 16:10:44)  

## 2018-04-14 ENCOUNTER — Ambulatory Visit: Payer: 59

## 2018-04-27 NOTE — Progress Notes (Signed)
[facility]  Telephone:([phone]) [phone] Fax:(336) [phone]    ID: Caitlin Wall DOB: 09-01-1955  MR#: [id number]  NFA#:[id number]  Patient Care Team: Biagio Borg, MD as PCP - General Tamala Julian Lynnell Dike, MD as PCP - Cardiology (Cardiology) Magrinat, Virgie Dad, MD as Consulting Physician (Oncology) Kyung Rudd, MD as Consulting Physician (Radiation Oncology) Jovita Kussmaul, MD as Consulting Physician (General Surgery) Sylvan Cheese, NP as Nurse Practitioner (Hematology and Oncology) OTHER MD: Keturah Barre MD   CHIEF COMPLAINT: Metastatic estrogen receptor positive breast cancer  CURRENT TREATMENT: Denosumab/Xgeva, fluvestrant, palbociclib   BREAST CANCER HISTORY:  From the original intake note:   Caitlin Wall [facility]. She has subpectoral saline implants in place. There was a possible asymmetry in the left breast and she was recalled for left diagnostic mammography with tomosynthesis and left breast ultrasonography 11/03/2014. The breast density was category C. In the left breast lower outer quadrant there was an irregular mass which was not palpable and which by ultrasonography measured 0.6 cm. The left axilla was sonographically benign. Biopsy of this mass 11/09/2014 showed ([id number]) fibroadipose adipose tissue. This was felt to be discordant.  Accordingly the patient was referred to surgery and she underwent left breast radioactive seed localized lumpectomy 12/28/2014. The pathology from that procedure (([id number]) showed ductal carcinoma in situ, high-grade, measuring 0.2 cm. This was less than 0.1 cm from the posterior margin. The cells was estrogen receptor positive at 95%, with strong staining intensity, progesterone receptor positive at 30% with moderate staining intensity.  Her subsequent history is as detailed below.   INTERVAL HISTORY: Caitlin Wall returns today for follow-up and treatment of her  estrogen receptor positive breast cancer.    She continues on denosumab/Xgeva. Her most recent dose was received on 03/18/2018. She tolerates this well and without any noticeable side effects.    She also continues on fulvestrant. Her most recent dose was received on 03/31/2018. She tolerates this well and without any noticeable side effects.    Finally, she also continues on palbociclib. She started this on 03/18/2018; she is currently in her second week.   Since her last visit here, she has not undergone any additional studies.    REVIEW OF SYSTEMS: Caitlin Wall is having leg cramping bilaterally, on the left more than the right; she stretches some, with little relief. When she takes tramadol, she feels pain behind her eyes and like her head is going to explode; she takes tylenol for pain. She notes pain in her right hip and left flank. The pain in her left side started about 3 or 4 days ago and shoe does not note any recent trauma. She does not have a formal exercise routine, but she tried to get out and walk when she can. Caitlin Wall notes recent epistaxis, but she attributes that to seasonal allergies. The patient denies unusual headaches, visual changes, nausea, vomiting, or dizziness. There has been no unusual cough, phlegm production, or pleurisy. This been no change in bowel or bladder habits. The patient denies unexplained fatigue or unexplained weight loss, bleeding, rash, or fever. A detailed review of systems was otherwise noncontributory.    PAST MEDICAL HISTORY: Past Medical History:  Diagnosis Date  . Asthma, persistent not controlled 07/27/2012  . Breast cancer ([place]) 12/28/14   Left BresastDCIS  . COLONIC POLYPS, HX OF 11/04/2007  . COPD (chronic obstructive pulmonary disease) ([place])   . GERD 11/04/2007  . GERD (gastroesophageal reflux  disease)   . HAIR LOSS 11/04/2007  . Hx of cardiac cath 11/15/2016   a. LHC 11/15/16 showed normal coronaries and EF 50-55% with normal EDP. (done for false positive  NST)  . Hx of colonic polyps 1995   adenomatous  . Hyperlipidemia   . HYPERLIPIDEMIA 11/04/2007  . Hypertension   . HYPERTENSION 06/07/2009  . LBBB (left bundle branch block) 07/26/2011  . Personal history of radiation therapy   . S/P radiation therapy 02/02/15-03/24/15   left breast 60.4GY    PAST SURGICAL HISTORY: Past Surgical History:  Procedure Laterality Date  . ABDOMINAL HYSTERECTOMY    . ANTERIOR FUSION CERVICAL SPINE    . AUGMENTATION MAMMAPLASTY Bilateral   . bilat ear surgury/hearing loss    . BREAST LUMPECTOMY Left 2016  . BREAST LUMPECTOMY WITH RADIOACTIVE SEED LOCALIZATION Left 12/28/2014   Procedure: BREAST LUMPECTOMY WITH RADIOACTIVE SEED LOCALIZATION;  Surgeon: Autumn Messing III, MD;  Location: [facility];  Service: General;  Laterality: Left;  . COLONOSCOPY    . LEFT HEART CATH AND CORONARY ANGIOGRAPHY N/A 11/15/2016   Procedure: LEFT HEART CATH AND CORONARY ANGIOGRAPHY;  Surgeon: Belva Crome, MD;  Location: [facility];  Service: Cardiovascular;  Laterality: N/A;  . POLYPECTOMY      FAMILY HISTORY: Family History  Problem Relation Age of Onset  . Breast cancer Other   . Cancer Paternal Grandmother        breast  . Lung cancer Father   . Breast cancer Maternal Grandmother   . Healthy Mother   . Coronary artery disease Neg Hx   . Colon cancer Neg Hx   . Pancreatic cancer Neg Hx   . Rectal cancer Neg Hx   . Stomach cancer Neg Hx   . Esophageal cancer Neg Hx    [name] died from lung cancer in the setting of tobacco abuse at age 40. [name] died from "old age" at age 13. The patient had one brother, 3 sisters. The brother had" I cancer" requiring enucleation--the patient does not know whether this was a primary ocular melanoma. [name] was diagnosed with breast cancer but the patient does not know at what age. There is no other history of breast or ovarian cancer in the  family   GYNECOLOGIC HISTORY:  No LMP recorded. Patient has had a hysterectomy. Menarche age 67, first live birth age 35. The patient is GX P3. She is status post hysterectomy without salpingo-oophorectomy. She did not take hormone replacement. She did use oral contraceptives remotely without any complications.   SOCIAL HISTORY:  Caitlin Wall works in accounts payable. Her husband [name] is disabled because of back problems. Daughter Caitlin Wall is a Marine scientist working in rehabilitation in [place]. Son Caitlin Wall lives in [place] where he works as a Games developer. Son Caitlin Wall lives in [place] where he works as a Emergency planning/management officer. The patient has 4 grandchildren. She is not a Ambulance person.    ADVANCED DIRECTIVES: Not in place   HEALTH MAINTENANCE: Social History   Tobacco Use  . Smoking status: Former Smoker    Packs/day: 1.00    Years: 20.00    Pack years: 20.00    Types: Cigarettes    Last attempt to quit: 08/19/1989    Years since quitting: 28.7  . Smokeless tobacco: Never Used  Substance Use Topics  . Alcohol use: No  . Drug use: No    Colonoscopy:  PAP:  Bone density:  Lipid panel:  Allergies  Allergen Reactions  . Codeine     REACTION: nausea,    Current Outpatient Medications  Medication Sig Dispense Refill  . amLODipine (NORVASC) 5 MG tablet Take 1 tablet (5 mg total) by mouth daily. 90 tablet 3  . aspirin EC 81 MG tablet Take 81 mg by mouth daily.    . cholecalciferol (VITAMIN D3) 25 MCG (1000 UT) tablet Take 1 tablet (1,000 Units total) by mouth daily.    Marland Kitchen losartan (COZAAR) 100 MG tablet Take 1 tablet (100 mg total) by mouth daily. 90 tablet 3  . Multiple Vitamin (MULTIVITAMIN WITH MINERALS) TABS tablet Take 1 tablet by mouth daily.    . palbociclib (IBRANCE) 125 MG capsule Take 1 capsule (125 mg total) by mouth daily. Take after lunch. Take for 21 days on, 7 days off, repeat every 28 days. 21 capsule 6   No current facility-administered medications for this  visit.     OBJECTIVE: Middle-aged white woman in no acute distress  Vitals:   04/28/18 0937  BP: 113/67  Pulse: 96  Resp: 18  Temp: 98.8 F (37.1 C)  SpO2: 97%     Body mass index is 32.6 kg/m.    ECOG FS:1 - Symptomatic but completely ambulatory  Sclerae unicteric, pupils round and equal Oropharynx clear and moist No cervical or supraclavicular adenopathy Lungs no rales or rhonchi Heart regular rate and rhythm Abd soft, nontender, positive bowel sounds MSK no focal spinal tenderness, no upper extremity lymphedema Neuro: nonfocal, well oriented, appropriate affect Breasts: Deferred     LAB RESULTS:  CMP     Component Value Date/Time   NA 144 03/03/2018 0847   NA 141 04/25/2017 1213   NA 145 07/04/2016 1133   K 4.3 03/03/2018 0847   K 4.2 07/04/2016 1133   CL 108 03/03/2018 0847   CO2 25 03/03/2018 0847   CO2 28 07/04/2016 1133   GLUCOSE 135 (H) 03/03/2018 0847   GLUCOSE 101 07/04/2016 1133   BUN 11 03/03/2018 0847   BUN 18 04/25/2017 1213   BUN 16.0 07/04/2016 1133   CREATININE 1.15 (H) 03/03/2018 0847   CREATININE 1.53 (H) 04/28/2017 1442   CREATININE 0.9 07/04/2016 1133   CALCIUM 10.0 03/03/2018 0847   CALCIUM 9.9 07/04/2016 1133   PROT 7.2 03/03/2018 0847   PROT 7.3 07/04/2016 1133   ALBUMIN 3.5 03/03/2018 0847   ALBUMIN 3.8 07/04/2016 1133   AST 24 03/03/2018 0847   AST 27 04/28/2017 1442   AST 19 07/04/2016 1133   ALT 15 03/03/2018 0847   ALT 41 04/28/2017 1442   ALT 17 07/04/2016 1133   ALKPHOS 137 (H) 03/03/2018 0847   ALKPHOS 172 (H) 07/04/2016 1133   BILITOT 0.8 03/03/2018 0847   BILITOT 0.7 04/28/2017 1442   BILITOT 0.94 07/04/2016 1133   GFRNONAA 52 (L) 03/03/2018 0847   GFRNONAA 36 (L) 04/28/2017 1442   GFRAA 60 (L) 03/03/2018 0847   GFRAA 42 (L) 04/28/2017 1442    INo results found for: SPEP, UPEP  Lab Results  Component Value Date   WBC 4.0 04/28/2018   NEUTROABS PENDING 04/28/2018   HGB 13.1 04/28/2018   HCT 39.0  04/28/2018   MCV 95.6 04/28/2018   PLT 264 04/28/2018      Chemistry      Component Value Date/Time   NA 144 03/03/2018 0847   NA 141 04/25/2017 1213   NA 145 07/04/2016 1133   K 4.3 03/03/2018 0847   K 4.2 07/04/2016  1133   CL 108 03/03/2018 0847   CO2 25 03/03/2018 0847   CO2 28 07/04/2016 1133   BUN 11 03/03/2018 0847   BUN 18 04/25/2017 1213   BUN 16.0 07/04/2016 1133   CREATININE 1.15 (H) 03/03/2018 0847   CREATININE 1.53 (H) 04/28/2017 1442   CREATININE 0.9 07/04/2016 1133      Component Value Date/Time   CALCIUM 10.0 03/03/2018 0847   CALCIUM 9.9 07/04/2016 1133   ALKPHOS 137 (H) 03/03/2018 0847   ALKPHOS 172 (H) 07/04/2016 1133   AST 24 03/03/2018 0847   AST 27 04/28/2017 1442   AST 19 07/04/2016 1133   ALT 15 03/03/2018 0847   ALT 41 04/28/2017 1442   ALT 17 07/04/2016 1133   BILITOT 0.8 03/03/2018 0847   BILITOT 0.7 04/28/2017 1442   BILITOT 0.94 07/04/2016 1133     No results found for: LABCA2  No components found for: LABCA125  No results for input(s): INR in the last 168 hours.  Urinalysis    Component Value Date/Time   COLORURINE YELLOW 10/20/2017 [place] 10/20/2017 1303   LABSPEC 1.020 10/20/2017 1303   PHURINE 6.0 10/20/2017 1303   GLUCOSEU NEGATIVE 10/20/2017 1303   HGBUR TRACE-INTACT (A) 10/20/2017 1303   BILIRUBINUR NEGATIVE 10/20/2017 1303   KETONESUR NEGATIVE 10/20/2017 1303   PROTEINUR NEGATIVE 05/12/2017 0054   UROBILINOGEN 1.0 10/20/2017 1303   NITRITE NEGATIVE 10/20/2017 1303   LEUKOCYTESUR TRACE (A) 10/20/2017 1303    STUDIES: Today's labs reviewed  ASSESSMENT: 63 y.o. [place] woman status post left lumpectomy 12/28/2014 for ductal carcinoma in situ, high-grade measuring 0.2 cm, estrogen and progesterone receptor positive, with close but negative margins  (1) adjuvant radiation completed 03/24/2015  (2) anastrozole started 04/19/2015   (a) bone density 03/01/2016 shows a T score of -1.2  (3)  mildly abnormal hepatic function panel: negative workup for hepatitis B and C, normal alpha-1-anti-trypsin  (4) left lower extremity DVT diagnosed by Doppler ultrasonography 04/16/2016, with negative CT angiogram chest  (a) on Xarelto starting 04/16/2016   (b) negative hypercoagulable panel, with a minimally abnormal (indeterminate) anti-cardiolipin IgM  ((c) stopped rivaroxaban 07/16/2016, repeat D-Dimer WNL August 2018  METASTATIC DISEASE: December 2019: bone only (5) CT chest 02/06/2018 shows a mixed lytic and sclerotic process with fracture at the inferior manubrium but no other bone lesions.  However bone scan 02/16/2018 shows multiple bone lesions consistent with metastatic disease; no lung or pleura involvement, no adenopathy  (a) Bone marrow biopsy on 03/03/2018 shows metastatic carcinoma consistent with breast primary, estrogen and progesterone receptor strongly positive, HER-2 not amplified (1+)  (b) MRI abdomen and MRI brain at [facility] (results viewable in "care everywhere") notes known bone involvement in spine and pelvis, but no other area of metastatic disease in abdomen/pelvis, or brain.  (c) CA 27.29 not informative (37.9 on 03/03/2018)  (6) started denosumab/Xgeva on 03/18/2018, repeated Q28 d  (7) started fulvestrant on 03/03/2018 and palbociclib 03/18/2018   PLAN: Vonita is now on the monthly treatment schedule and tolerating it very well.  The plan is to continue this until there is evidence of disease progression  We discussed the fact that it is very difficult to evaluate patients with bone only disease.  The bone disease is not "measurable", and may look better when it is actually getting worse and vice versa.  The 1 finding that is definitive in this situation is when the patient has new bone spots.  However if she had  new bone spots at this point it might be due to the fact that she is receiving denosumab/Xgeva and that can turn some lytic lesions into sclerotic  once.  Accordingly when she has a repeat bone scan in April of this year that will serve as our new baseline.  We will plan to repeat that in October and at that point if there are new lesions and that would constitute definitive evidence of progression  She knows to call for any other issue that may develop before the next visit.   Magrinat, Virgie Dad, MD  04/28/18 10:02 AM Medical Oncology and Hematology [facility] [address] [place], [place] [address] Tel. [phone]    Fax. [phone]  I, Jacqualyn Posey am acting as a Education administrator for Chauncey Cruel, MD.   I, Lurline Del MD, have reviewed the above documentation for accuracy and completeness, and I agree with the above.

## 2018-04-28 ENCOUNTER — Inpatient Hospital Stay (HOSPITAL_BASED_OUTPATIENT_CLINIC_OR_DEPARTMENT_OTHER): Payer: 59 | Admitting: Oncology

## 2018-04-28 ENCOUNTER — Other Ambulatory Visit: Payer: 59

## 2018-04-28 ENCOUNTER — Ambulatory Visit: Payer: 59 | Admitting: Oncology

## 2018-04-28 ENCOUNTER — Inpatient Hospital Stay: Payer: 59 | Attending: Oncology

## 2018-04-28 ENCOUNTER — Inpatient Hospital Stay: Payer: 59

## 2018-04-28 ENCOUNTER — Ambulatory Visit: Payer: 59

## 2018-04-28 ENCOUNTER — Telehealth: Payer: Self-pay | Admitting: Oncology

## 2018-04-28 VITALS — BP 113/67 | HR 96 | Temp 98.8°F | Resp 18 | Ht 65.0 in | Wt 195.9 lb

## 2018-04-28 DIAGNOSIS — Z170 Estrogen receptor positive status [ER+]: Secondary | ICD-10-CM

## 2018-04-28 DIAGNOSIS — C50512 Malignant neoplasm of lower-outer quadrant of left female breast: Secondary | ICD-10-CM

## 2018-04-28 DIAGNOSIS — Z9882 Breast implant status: Secondary | ICD-10-CM | POA: Diagnosis not present

## 2018-04-28 DIAGNOSIS — Z5111 Encounter for antineoplastic chemotherapy: Secondary | ICD-10-CM | POA: Diagnosis not present

## 2018-04-28 DIAGNOSIS — C7951 Secondary malignant neoplasm of bone: Secondary | ICD-10-CM | POA: Diagnosis not present

## 2018-04-28 DIAGNOSIS — Z923 Personal history of irradiation: Secondary | ICD-10-CM | POA: Diagnosis not present

## 2018-04-28 DIAGNOSIS — I10 Essential (primary) hypertension: Secondary | ICD-10-CM | POA: Diagnosis not present

## 2018-04-28 DIAGNOSIS — Z803 Family history of malignant neoplasm of breast: Secondary | ICD-10-CM | POA: Diagnosis not present

## 2018-04-28 DIAGNOSIS — G893 Neoplasm related pain (acute) (chronic): Secondary | ICD-10-CM

## 2018-04-28 DIAGNOSIS — Z86718 Personal history of other venous thrombosis and embolism: Secondary | ICD-10-CM

## 2018-04-28 DIAGNOSIS — K219 Gastro-esophageal reflux disease without esophagitis: Secondary | ICD-10-CM | POA: Insufficient documentation

## 2018-04-28 DIAGNOSIS — Z79899 Other long term (current) drug therapy: Secondary | ICD-10-CM | POA: Diagnosis not present

## 2018-04-28 DIAGNOSIS — Z87891 Personal history of nicotine dependence: Secondary | ICD-10-CM | POA: Insufficient documentation

## 2018-04-28 DIAGNOSIS — Z7901 Long term (current) use of anticoagulants: Secondary | ICD-10-CM | POA: Insufficient documentation

## 2018-04-28 DIAGNOSIS — I824Z2 Acute embolism and thrombosis of unspecified deep veins of left distal lower extremity: Secondary | ICD-10-CM

## 2018-04-28 DIAGNOSIS — Z7982 Long term (current) use of aspirin: Secondary | ICD-10-CM | POA: Diagnosis not present

## 2018-04-28 LAB — COMPREHENSIVE METABOLIC PANEL
ALBUMIN: 3.9 g/dL (ref 3.5–5.0)
ALT: 15 U/L (ref 0–44)
AST: 22 U/L (ref 15–41)
Alkaline Phosphatase: 137 U/L — ABNORMAL HIGH (ref 38–126)
Anion gap: 12 (ref 5–15)
BUN: 16 mg/dL (ref 8–23)
CALCIUM: 9.5 mg/dL (ref 8.9–10.3)
CO2: 25 mmol/L (ref 22–32)
Chloride: 107 mmol/L (ref 98–111)
Creatinine, Ser: 1.25 mg/dL — ABNORMAL HIGH (ref 0.44–1.00)
GFR calc Af Amer: 54 mL/min — ABNORMAL LOW (ref >60)
GFR calc non Af Amer: 46 mL/min — ABNORMAL LOW (ref >60)
Glucose, Bld: 111 mg/dL — ABNORMAL HIGH (ref 70–99)
Potassium: 4.1 mmol/L (ref 3.5–5.1)
Sodium: 144 mmol/L (ref 135–145)
Total Bilirubin: 1.1 mg/dL (ref 0.3–1.2)
Total Protein: 7.5 g/dL (ref 6.5–8.1)

## 2018-04-28 LAB — CBC WITH DIFFERENTIAL/PLATELET
Abs Immature Granulocytes: 0.01 10*3/uL (ref 0.00–0.07)
BASOS ABS: 0.1 10*3/uL (ref 0.0–0.1)
Basophils Relative: 2 %
Eosinophils Absolute: 0 10*3/uL (ref 0.0–0.5)
Eosinophils Relative: 1 %
HCT: 39 % (ref 36.0–46.0)
Hemoglobin: 13.1 g/dL (ref 12.0–15.0)
Immature Granulocytes: 0 %
Lymphocytes Relative: 37 %
Lymphs Abs: 1.5 10*3/uL (ref 0.7–4.0)
MCH: 32.1 pg (ref 26.0–34.0)
MCHC: 33.6 g/dL (ref 30.0–36.0)
MCV: 95.6 fL (ref 80.0–100.0)
Monocytes Absolute: 0.3 10*3/uL (ref 0.1–1.0)
Monocytes Relative: 8 %
NEUTROS PCT: 52 %
NRBC: 0 % (ref 0.0–0.2)
Neutro Abs: 2.1 10*3/uL (ref 1.7–7.7)
Platelets: 264 10*3/uL (ref 150–400)
RBC: 4.08 MIL/uL (ref 3.87–5.11)
RDW: 18.4 % — ABNORMAL HIGH (ref 11.5–15.5)
WBC: 4 10*3/uL (ref 4.0–10.5)

## 2018-04-28 MED ORDER — DENOSUMAB 120 MG/1.7ML ~~LOC~~ SOLN
120.0000 mg | Freq: Once | SUBCUTANEOUS | Status: AC
  Administered 2018-04-28: 120 mg via SUBCUTANEOUS

## 2018-04-28 MED ORDER — DENOSUMAB 120 MG/1.7ML ~~LOC~~ SOLN
SUBCUTANEOUS | Status: AC
  Filled 2018-04-28: qty 1.7

## 2018-04-28 MED ORDER — FULVESTRANT 250 MG/5ML IM SOLN
500.0000 mg | Freq: Once | INTRAMUSCULAR | Status: AC
  Administered 2018-04-28: 500 mg via INTRAMUSCULAR

## 2018-04-28 MED ORDER — FULVESTRANT 250 MG/5ML IM SOLN
INTRAMUSCULAR | Status: AC
  Filled 2018-04-28: qty 5

## 2018-04-28 NOTE — Telephone Encounter (Signed)
Gave avs and calendar ° °

## 2018-04-30 ENCOUNTER — Telehealth: Payer: Self-pay | Admitting: *Deleted

## 2018-04-30 NOTE — Telephone Encounter (Signed)
This RN return call from pt.  Caitlin Wall states she has developed a sore throat " like deep down in my throat and a cough "  Note pt's voice was hoarse as well as talking aggravated her throat and she coughed a barky bronchial raspy cough.  Caitlin Wall denies any fevers.  Cough is not productive.  She is asking what she can take - this RN discussed OTC medication of Muccinex DM ( can get generic equivalent ).  She is presently on d15 of her Ibrance cycle.  Per MD review of above further recommendation given for pt to hold on completing this cycle of Ibrance .  Pt made aware of above.

## 2018-05-05 ENCOUNTER — Telehealth: Payer: Self-pay | Admitting: *Deleted

## 2018-05-05 MED ORDER — AZITHROMYCIN 250 MG PO TABS: ORAL_TABLET | ORAL | 0 refills | Status: DC

## 2018-05-05 MED ORDER — BENZONATATE 100 MG PO CAPS: 100.0000 mg | ORAL_CAPSULE | Freq: Three times a day (TID) | ORAL | 1 refills | Status: DC | PRN

## 2018-05-05 NOTE — Telephone Encounter (Signed)
Pt called to this RN to state cough is not improving. She denies any fevers. Cough has intermittent production of green and brown sputum.  Per MD review- recommendation given for Z pak and tessalon perles.  Above called to pt and then to verified pharmacy.

## 2018-05-12 ENCOUNTER — Telehealth: Payer: Self-pay | Admitting: *Deleted

## 2018-05-12 ENCOUNTER — Other Ambulatory Visit: Payer: Self-pay | Admitting: Oncology

## 2018-05-12 MED ORDER — PROMETHAZINE-CODEINE 6.25-10 MG/5ML PO SYRP: 5.0000 mL | ORAL_SOLUTION | Freq: Four times a day (QID) | ORAL | 0 refills | Status: DC | PRN

## 2018-05-12 NOTE — Telephone Encounter (Signed)
Pt called to this RN to state she is continuing with congested cough.  She states production which was green/brown has now improved ( she has clear phlegm ) .  She denies any fevers.  She is using the tessalon perles and states " it helps some but then after a few hours I start coughing again "  Criss is due to restart her next cycle of Ibrance this Thursday.  Per review with MD - phenergan with codeine cough syrup will be sent to her verified pharmacy.  Appointment also made for lab recheck in AM.

## 2018-05-13 ENCOUNTER — Ambulatory Visit (HOSPITAL_COMMUNITY)
Admission: RE | Admit: 2018-05-13 | Discharge: 2018-05-13 | Disposition: A | Discharge status: Home / Self Care | Payer: 59 | Source: Ambulatory Visit | Attending: Oncology | Admitting: Oncology

## 2018-05-13 ENCOUNTER — Other Ambulatory Visit: Payer: Self-pay

## 2018-05-13 ENCOUNTER — Other Ambulatory Visit: Payer: Self-pay | Admitting: *Deleted

## 2018-05-13 ENCOUNTER — Inpatient Hospital Stay: Payer: 59

## 2018-05-13 DIAGNOSIS — R918 Other nonspecific abnormal finding of lung field: Secondary | ICD-10-CM | POA: Diagnosis present

## 2018-05-13 DIAGNOSIS — C50512 Malignant neoplasm of lower-outer quadrant of left female breast: Secondary | ICD-10-CM

## 2018-05-13 DIAGNOSIS — I824Z2 Acute embolism and thrombosis of unspecified deep veins of left distal lower extremity: Secondary | ICD-10-CM

## 2018-05-13 DIAGNOSIS — Z170 Estrogen receptor positive status [ER+]: Secondary | ICD-10-CM | POA: Insufficient documentation

## 2018-05-13 DIAGNOSIS — R0602 Shortness of breath: Secondary | ICD-10-CM | POA: Diagnosis not present

## 2018-05-13 DIAGNOSIS — J4520 Mild intermittent asthma, uncomplicated: Secondary | ICD-10-CM

## 2018-05-13 DIAGNOSIS — R05 Cough: Secondary | ICD-10-CM | POA: Diagnosis not present

## 2018-05-13 DIAGNOSIS — Z5111 Encounter for antineoplastic chemotherapy: Secondary | ICD-10-CM | POA: Diagnosis not present

## 2018-05-13 LAB — CBC WITH DIFFERENTIAL/PLATELET
ABS IMMATURE GRANULOCYTES: 0.01 10*3/uL (ref 0.00–0.07)
Basophils Absolute: 0.1 10*3/uL (ref 0.0–0.1)
Basophils Relative: 3 %
Eosinophils Absolute: 0.1 10*3/uL (ref 0.0–0.5)
Eosinophils Relative: 2 %
HEMATOCRIT: 37.3 % (ref 36.0–46.0)
HEMOGLOBIN: 12.4 g/dL (ref 12.0–15.0)
Immature Granulocytes: 0 %
Lymphocytes Relative: 22 %
Lymphs Abs: 1.1 10*3/uL (ref 0.7–4.0)
MCH: 33.1 pg (ref 26.0–34.0)
MCHC: 33.2 g/dL (ref 30.0–36.0)
MCV: 99.5 fL (ref 80.0–100.0)
Monocytes Absolute: 1.2 10*3/uL — ABNORMAL HIGH (ref 0.1–1.0)
Monocytes Relative: 23 %
NEUTROS ABS: 2.5 10*3/uL (ref 1.7–7.7)
Neutrophils Relative %: 50 %
Platelets: 289 10*3/uL (ref 150–400)
RBC: 3.75 MIL/uL — ABNORMAL LOW (ref 3.87–5.11)
RDW: 19.3 % — ABNORMAL HIGH (ref 11.5–15.5)
WBC: 5.1 10*3/uL (ref 4.0–10.5)
nRBC: 0 % (ref 0.0–0.2)

## 2018-05-13 LAB — COMPREHENSIVE METABOLIC PANEL
ALT: 27 U/L (ref 0–44)
AST: 25 U/L (ref 15–41)
Albumin: 3.6 g/dL (ref 3.5–5.0)
Alkaline Phosphatase: 115 U/L (ref 38–126)
Anion gap: 10 (ref 5–15)
BUN: 9 mg/dL (ref 8–23)
CO2: 26 mmol/L (ref 22–32)
Calcium: 9 mg/dL (ref 8.9–10.3)
Chloride: 108 mmol/L (ref 98–111)
Creatinine, Ser: 1.17 mg/dL — ABNORMAL HIGH (ref 0.44–1.00)
GFR calc Af Amer: 58 mL/min — ABNORMAL LOW (ref >60)
GFR calc non Af Amer: 50 mL/min — ABNORMAL LOW (ref >60)
Glucose, Bld: 83 mg/dL (ref 70–99)
Potassium: 4.2 mmol/L (ref 3.5–5.1)
Sodium: 144 mmol/L (ref 135–145)
Total Bilirubin: 0.9 mg/dL (ref 0.3–1.2)
Total Protein: 7.3 g/dL (ref 6.5–8.1)

## 2018-05-14 ENCOUNTER — Telehealth: Payer: Self-pay

## 2018-05-14 NOTE — Telephone Encounter (Signed)
Returned patient's call - left voicemail for return call.

## 2018-05-26 ENCOUNTER — Other Ambulatory Visit: Payer: Self-pay

## 2018-05-26 ENCOUNTER — Inpatient Hospital Stay: Payer: 59

## 2018-05-26 VITALS — BP 144/82 | HR 83 | Resp 18

## 2018-05-26 DIAGNOSIS — G893 Neoplasm related pain (acute) (chronic): Secondary | ICD-10-CM

## 2018-05-26 DIAGNOSIS — Z170 Estrogen receptor positive status [ER+]: Principal | ICD-10-CM

## 2018-05-26 DIAGNOSIS — Z5111 Encounter for antineoplastic chemotherapy: Secondary | ICD-10-CM | POA: Diagnosis not present

## 2018-05-26 DIAGNOSIS — C50512 Malignant neoplasm of lower-outer quadrant of left female breast: Secondary | ICD-10-CM

## 2018-05-26 DIAGNOSIS — C7951 Secondary malignant neoplasm of bone: Secondary | ICD-10-CM

## 2018-05-26 DIAGNOSIS — I824Z2 Acute embolism and thrombosis of unspecified deep veins of left distal lower extremity: Secondary | ICD-10-CM

## 2018-05-26 LAB — COMPREHENSIVE METABOLIC PANEL
ALT: 14 U/L (ref 0–44)
ANION GAP: 10 (ref 5–15)
AST: 18 U/L (ref 15–41)
Albumin: 3.6 g/dL (ref 3.5–5.0)
Alkaline Phosphatase: 102 U/L (ref 38–126)
BUN: 19 mg/dL (ref 8–23)
CO2: 24 mmol/L (ref 22–32)
Calcium: 9.2 mg/dL (ref 8.9–10.3)
Chloride: 107 mmol/L (ref 98–111)
Creatinine, Ser: 1.32 mg/dL — ABNORMAL HIGH (ref 0.44–1.00)
GFR calc Af Amer: 50 mL/min — ABNORMAL LOW (ref >60)
GFR, EST NON AFRICAN AMERICAN: 43 mL/min — AB (ref >60)
Glucose, Bld: 116 mg/dL — ABNORMAL HIGH (ref 70–99)
Potassium: 4 mmol/L (ref 3.5–5.1)
Sodium: 141 mmol/L (ref 135–145)
Total Bilirubin: 1.2 mg/dL (ref 0.3–1.2)
Total Protein: 7.3 g/dL (ref 6.5–8.1)

## 2018-05-26 LAB — CBC WITH DIFFERENTIAL/PLATELET
Abs Immature Granulocytes: 0.03 10*3/uL (ref 0.00–0.07)
Basophils Absolute: 0.1 10*3/uL (ref 0.0–0.1)
Basophils Relative: 2 %
Eosinophils Absolute: 0.1 10*3/uL (ref 0.0–0.5)
Eosinophils Relative: 4 %
HCT: 36.9 % (ref 36.0–46.0)
Hemoglobin: 12.2 g/dL (ref 12.0–15.0)
Immature Granulocytes: 1 %
Lymphocytes Relative: 36 %
Lymphs Abs: 1.3 10*3/uL (ref 0.7–4.0)
MCH: 33.4 pg (ref 26.0–34.0)
MCHC: 33.1 g/dL (ref 30.0–36.0)
MCV: 101.1 fL — ABNORMAL HIGH (ref 80.0–100.0)
MONOS PCT: 6 %
Monocytes Absolute: 0.2 10*3/uL (ref 0.1–1.0)
Neutro Abs: 1.9 10*3/uL (ref 1.7–7.7)
Neutrophils Relative %: 51 %
Platelets: 278 10*3/uL (ref 150–400)
RBC: 3.65 MIL/uL — ABNORMAL LOW (ref 3.87–5.11)
RDW: 17.1 % — ABNORMAL HIGH (ref 11.5–15.5)
WBC: 3.7 10*3/uL — ABNORMAL LOW (ref 4.0–10.5)
nRBC: 0 % (ref 0.0–0.2)

## 2018-05-26 MED ORDER — DENOSUMAB 120 MG/1.7ML ~~LOC~~ SOLN
120.0000 mg | Freq: Once | SUBCUTANEOUS | Status: AC
  Administered 2018-05-26: 120 mg via SUBCUTANEOUS

## 2018-05-26 MED ORDER — DENOSUMAB 120 MG/1.7ML ~~LOC~~ SOLN
SUBCUTANEOUS | Status: AC
  Filled 2018-05-26: qty 1.7

## 2018-05-26 MED ORDER — FULVESTRANT 250 MG/5ML IM SOLN
INTRAMUSCULAR | Status: AC
  Filled 2018-05-26: qty 5

## 2018-05-26 MED ORDER — FULVESTRANT 250 MG/5ML IM SOLN
500.0000 mg | Freq: Once | INTRAMUSCULAR | Status: AC
  Administered 2018-05-26: 500 mg via INTRAMUSCULAR

## 2018-05-26 NOTE — Patient Instructions (Signed)
Fulvestrant injection What is this medicine? FULVESTRANT (ful VES trant) blocks the effects of estrogen. It is used to treat breast cancer. This medicine may be used for other purposes; ask your health care provider or pharmacist if you have questions. COMMON BRAND NAME(S): FASLODEX What should I tell my health care provider before I take this medicine? They need to know if you have any of these conditions: -bleeding disorders -liver disease -low blood counts, like low white cell, platelet, or red cell counts -an unusual or allergic reaction to fulvestrant, other medicines, foods, dyes, or preservatives -pregnant or trying to get pregnant -breast-feeding How should I use this medicine? This medicine is for injection into a muscle. It is usually given by a health care professional in a hospital or clinic setting. Talk to your pediatrician regarding the use of this medicine in children. Special care may be needed. Overdosage: If you think you have taken too much of this medicine contact a poison control center or emergency room at once. NOTE: This medicine is only for you. Do not share this medicine with others. What if I miss a dose? It is important not to miss your dose. Call your doctor or health care professional if you are unable to keep an appointment. What may interact with this medicine? -medicines that treat or prevent blood clots like warfarin, enoxaparin, dalteparin, apixaban, dabigatran, and rivaroxaban This list may not describe all possible interactions. Give your health care provider a list of all the medicines, herbs, non-prescription drugs, or dietary supplements you use. Also tell them if you smoke, drink alcohol, or use illegal drugs. Some items may interact with your medicine. What should I watch for while using this medicine? Your condition will be monitored carefully while you are receiving this medicine. You will need important blood work done while you are taking this  medicine. Do not become pregnant while taking this medicine or for at least 1 year after stopping it. Women of child-bearing potential will need to have a negative pregnancy test before starting this medicine. Women should inform their doctor if they wish to become pregnant or think they might be pregnant. There is a potential for serious side effects to an unborn child. Men should inform their doctors if they wish to father a child. This medicine may lower sperm counts. Talk to your health care professional or pharmacist for more information. Do not breast-feed an infant while taking this medicine or for 1 year after the last dose. What side effects may I notice from receiving this medicine? Side effects that you should report to your doctor or health care professional as soon as possible: -allergic reactions like skin rash, itching or hives, swelling of the face, lips, or tongue -feeling faint or lightheaded, falls -pain, tingling, numbness, or weakness in the legs -signs and symptoms of infection like fever or chills; cough; flu-like symptoms; sore throat -vaginal bleeding Side effects that usually do not require medical attention (report to your doctor or health care professional if they continue or are bothersome): -aches, pains -constipation -diarrhea -headache -hot flashes -nausea, vomiting -pain at site where injected -stomach pain This list may not describe all possible side effects. Call your doctor for medical advice about side effects. You may report side effects to FDA at 1-800-FDA-1088. Where should I keep my medicine? This drug is given in a hospital or clinic and will not be stored at home. NOTE: This sheet is a summary. It may not cover all possible information. If you  have questions about this medicine, talk to your doctor, pharmacist, or health care provider.  2019 [employer] Standard (2017-05-22 11:34:41) Denosumab injection What is this medicine? DENOSUMAB (den oh  sue mab) slows bone breakdown. Prolia is used to treat osteoporosis in women after menopause and in men, and in people who are taking corticosteroids for 6 months or more. Delton See is used to treat a high calcium level due to cancer and to prevent bone fractures and other bone problems caused by multiple myeloma or cancer bone metastases. Delton See is also used to treat giant cell tumor of the bone. This medicine may be used for other purposes; ask your health care provider or pharmacist if you have questions. COMMON BRAND NAME(S): Prolia, XGEVA What should I tell my health care provider before I take this medicine? They need to know if you have any of these conditions: -dental disease -having surgery or tooth extraction -infection -kidney disease -low levels of calcium or Vitamin D in the blood -malnutrition -on hemodialysis -skin conditions or sensitivity -thyroid or parathyroid disease -an unusual reaction to denosumab, other medicines, foods, dyes, or preservatives -pregnant or trying to get pregnant -breast-feeding How should I use this medicine? This medicine is for injection under the skin. It is given by a health care professional in a hospital or clinic setting. A special MedGuide will be given to you before each treatment. Be sure to read this information carefully each time. For Prolia, talk to your pediatrician regarding the use of this medicine in children. Special care may be needed. For Delton See, talk to your pediatrician regarding the use of this medicine in children. While this drug may be prescribed for children as young as 13 years for selected conditions, precautions do apply. Overdosage: If you think you have taken too much of this medicine contact a poison control center or emergency room at once. NOTE: This medicine is only for you. Do not share this medicine with others. What if I miss a dose? It is important not to miss your dose. Call your doctor or health care professional if  you are unable to keep an appointment. What may interact with this medicine? Do not take this medicine with any of the following medications: -other medicines containing denosumab This medicine may also interact with the following medications: -medicines that lower your chance of fighting infection -steroid medicines like prednisone or cortisone This list may not describe all possible interactions. Give your health care provider a list of all the medicines, herbs, non-prescription drugs, or dietary supplements you use. Also tell them if you smoke, drink alcohol, or use illegal drugs. Some items may interact with your medicine. What should I watch for while using this medicine? Visit your doctor or health care professional for regular checks on your progress. Your doctor or health care professional may order blood tests and other tests to see how you are doing. Call your doctor or health care professional for advice if you get a fever, chills or sore throat, or other symptoms of a cold or flu. Do not treat yourself. This drug may decrease your body's ability to fight infection. Try to avoid being around people who are sick. You should make sure you get enough calcium and vitamin D while you are taking this medicine, unless your doctor tells you not to. Discuss the foods you eat and the vitamins you take with your health care professional. See your dentist regularly. Brush and floss your teeth as directed. Before you have any dental work  dental work done, tell your dentist you are receiving this medicine. Do not become pregnant while taking this medicine or for 5 months after stopping it. Talk with your doctor or health care professional about your birth control options while taking this medicine. Women should inform their doctor if they wish to become pregnant or think they might be pregnant. There is a potential for serious side effects to an unborn child. Talk to your health care professional or pharmacist for  more information. What side effects may I notice from receiving this medicine? Side effects that you should report to your doctor or health care professional as soon as possible: -allergic reactions like skin rash, itching or hives, swelling of the face, lips, or tongue -bone pain -breathing problems -dizziness -jaw pain, especially after dental work -redness, blistering, peeling of the skin -signs and symptoms of infection like fever or chills; cough; sore throat; pain or trouble passing urine -signs of low calcium like fast heartbeat, muscle cramps or muscle pain; pain, tingling, numbness in the hands or feet; seizures -unusual bleeding or bruising -unusually weak or tired Side effects that usually do not require medical attention (report to your doctor or health care professional if they continue or are bothersome): -constipation -diarrhea -headache -joint pain -loss of appetite -muscle pain -runny nose -tiredness -upset stomach This list may not describe all possible side effects. Call your doctor for medical advice about side effects. You may report side effects to FDA at 1-800-FDA-1088. Where should I keep my medicine? This medicine is only given in a clinic, doctor's office, or other health care setting and will not be stored at home. NOTE: This sheet is a summary. It may not cover all possible information. If you have questions about this medicine, talk to your doctor, pharmacist, or health care provider.  2019 [employer]/Gold Standard (2017-06-20 16:10:44)  

## 2018-05-29 ENCOUNTER — Telehealth: Payer: Self-pay | Admitting: *Deleted

## 2018-05-29 ENCOUNTER — Other Ambulatory Visit: Payer: Self-pay | Admitting: Adult Health

## 2018-05-29 DIAGNOSIS — J208 Acute bronchitis due to other specified organisms: Principal | ICD-10-CM

## 2018-05-29 DIAGNOSIS — B9689 Other specified bacterial agents as the cause of diseases classified elsewhere: Secondary | ICD-10-CM

## 2018-05-29 MED ORDER — DOXYCYCLINE HYCLATE 100 MG PO TABS: 100.0000 mg | ORAL_TABLET | Freq: Two times a day (BID) | ORAL | 0 refills | Status: DC

## 2018-05-29 MED ORDER — IPRATROPIUM-ALBUTEROL 20-100 MCG/ACT IN AERS: 1.0000 | INHALATION_SPRAY | Freq: Four times a day (QID) | RESPIRATORY_TRACT | 0 refills | Status: DC

## 2018-05-29 MED ORDER — SPACER/AERO-HOLDING CHAMBERS DEVI: 0 refills | Status: DC

## 2018-05-29 NOTE — Progress Notes (Signed)
Reviewed patient history in mychart and symptoms with Marlon Pel, RN.  Prescribed Doxycycline 100mg  PO BID, albuterol inhaler.  Recommended she hold her last week of Ibrance.  Patient to call for any worsening symptoms, concerns.    Wilber Bihari, NP

## 2018-05-29 NOTE — Telephone Encounter (Signed)
This RN spoke with pt per her call stating ongoing cough ( see prior notes ) - now with returning production of green sputum.  Caitlin Wall denies any fevers, sore throat or headache.  She has no known Covid risk interactions.  She is having mild chest pain on left side with coughing.  She is on day 15 of 3rd cycle Ibrance.  Per review with [facility]/NP-   Prescription for antibiotic ( doxycyline ) and inhaler sent to pharmacy. Requested for pt to hold further Ibrance for this cycle.  This RN called and discussed above with pt who verbalized understanding as well as to call if symptoms worsen.  Note pt is scheduled for restaging studies 4/15.

## 2018-06-10 ENCOUNTER — Ambulatory Visit (HOSPITAL_COMMUNITY)
Admission: RE | Admit: 2018-06-10 | Discharge: 2018-06-10 | Disposition: A | Discharge status: Home / Self Care | Payer: 59 | Source: Ambulatory Visit | Attending: Oncology | Admitting: Oncology

## 2018-06-10 ENCOUNTER — Other Ambulatory Visit (HOSPITAL_COMMUNITY): Payer: 59

## 2018-06-10 ENCOUNTER — Other Ambulatory Visit (HOSPITAL_COMMUNITY): Payer: Self-pay | Admitting: Oncology

## 2018-06-10 ENCOUNTER — Other Ambulatory Visit: Payer: Self-pay

## 2018-06-10 ENCOUNTER — Encounter (HOSPITAL_COMMUNITY): Admission: RE | Admit: 2018-06-10 | Payer: 59 | Source: Ambulatory Visit

## 2018-06-10 ENCOUNTER — Encounter (HOSPITAL_COMMUNITY)
Admission: RE | Admit: 2018-06-10 | Discharge: 2018-06-10 | Disposition: A | Discharge status: Home / Self Care | Payer: 59 | Source: Ambulatory Visit | Attending: Oncology | Admitting: Oncology

## 2018-06-10 DIAGNOSIS — G893 Neoplasm related pain (acute) (chronic): Secondary | ICD-10-CM | POA: Insufficient documentation

## 2018-06-10 DIAGNOSIS — Z170 Estrogen receptor positive status [ER+]: Principal | ICD-10-CM

## 2018-06-10 DIAGNOSIS — C50512 Malignant neoplasm of lower-outer quadrant of left female breast: Secondary | ICD-10-CM

## 2018-06-10 DIAGNOSIS — C50919 Malignant neoplasm of unspecified site of unspecified female breast: Secondary | ICD-10-CM | POA: Diagnosis not present

## 2018-06-10 DIAGNOSIS — C7951 Secondary malignant neoplasm of bone: Secondary | ICD-10-CM | POA: Diagnosis present

## 2018-06-10 DIAGNOSIS — R0782 Intercostal pain: Secondary | ICD-10-CM | POA: Diagnosis not present

## 2018-06-10 MED ORDER — TECHNETIUM TC 99M MEDRONATE IV KIT
20.0000 | PACK | Freq: Once | INTRAVENOUS | Status: AC | PRN
  Administered 2018-06-10: 09:00:00 20.4 via INTRAVENOUS

## 2018-06-11 ENCOUNTER — Telehealth: Payer: Self-pay | Admitting: *Deleted

## 2018-06-11 MED ORDER — FLUCONAZOLE 100 MG PO TABS: 100.0000 mg | ORAL_TABLET | Freq: Every day | ORAL | 0 refills | Status: DC

## 2018-06-11 NOTE — Telephone Encounter (Signed)
Pt called to this RN to state onset of vaginal yeast post completion of recent antibiotic therapy.  Caitlin Wall does report cough is much better as well as overall improvement in symptoms.  She also inquired if she can use the inhaler prescribed when she is on the [place] ( due for restart today ).  This RN informed pt diflucan will be sent to verified pharmacy as well she can use inhaler as needed while on Ibrance.  No further needs at this time.

## 2018-06-15 ENCOUNTER — Other Ambulatory Visit: Payer: Self-pay | Admitting: Oncology

## 2018-06-22 NOTE — Progress Notes (Signed)
[place]  [phone] [phone]    ID: Caitlin Wall DOB: [birth year]  MR#: [id number]  SFK#:[id number]  Patient Care Team: Biagio Borg, MD as PCP - General Tamala Julian Lynnell Dike, MD as PCP - Cardiology (Cardiology) Magrinat, Virgie Dad, MD as Consulting Physician (Oncology) Kyung Rudd, MD as Consulting Physician (Radiation Oncology) Jovita Kussmaul, MD as Consulting Physician (General Surgery) Sylvan Cheese, NP as Nurse Practitioner (Hematology and Oncology) OTHER MD: Keturah Barre MD   CHIEF COMPLAINT: Metastatic estrogen receptor positive breast cancer  CURRENT TREATMENT: Denosumab/Xgeva, fluvestrant, palbociclib   BREAST CANCER HISTORY:  From the original intake note:   Tytionna [facility]. She has subpectoral saline implants in place. There was a possible asymmetry in the left breast and she was recalled for left diagnostic mammography with tomosynthesis and left breast ultrasonography 11/03/2014. The breast density was category C. In the left breast lower outer quadrant there was an irregular mass which was not palpable and which by ultrasonography measured 0.6 cm. The left axilla was sonographically benign. Biopsy of this mass 11/09/2014 showed ([id number]) fibroadipose adipose tissue. This was felt to be discordant.  Accordingly the patient was referred to surgery and she underwent left breast radioactive seed localized lumpectomy 12/28/2014. The pathology from that procedure (([id number]) showed ductal carcinoma in situ, high-grade, measuring 0.2 cm. This was less than 0.1 cm from the posterior margin. The cells was estrogen receptor positive at 95%, with strong staining intensity, progesterone receptor positive at 30% with moderate staining intensity.  Her subsequent history is as detailed below.   INTERVAL HISTORY: Caitlin Wall is seen today for follow-up and treatment of her  estrogen receptor positive breast cancer.   She continues on denosumab/Xgeva, with her most recent dose received on 05/26/2018. She tolerates this well and without any noticeable side effects.    She also continues on fulvestrant, with her most recent dose received on 05/26/2018. She tolerates this well and without any noticeable side effects.     She also continues on palbociclib/Ibrance. She feels like her muscles are getting weaker, and she gets fatigued easier than before.   Since her last visit here, she underwent a chest xray on 05/13/2018 showing stable chronic lung disease. No acute findings.   She also underwent a whole body bone scan on 06/10/2018 showing new areas of abnormal uptake involving bilateral ribs and the lower thoracic spine. Findings are concerning for new osseous metastatic disease. Question abnormal uptake involving the left femoral neck. Persistent increased uptake in the manubrium. Findings remain suspicious for metastatic disease based on the previous CT imaging.    REVIEW OF SYSTEMS: Caitlin Wall notes that her ribs have been hurting, which she treats with about two 200 mg ibuprofen tablets, three to four times per day. Amid COVID-19, she continues to work, and she is unable to work from home. 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 She questions about whether she is a candidate for disability.   The patient denies unusual headaches, visual changes, nausea, vomiting, or dizziness. There has been no unusual cough, phlegm production, or pleurisy. This been no change in bowel or bladder habits. The patient denies unexplained weight loss, bleeding, rash, or fever. A detailed review of systems was otherwise noncontributory.    PAST MEDICAL HISTORY: Past Medical History:  Diagnosis Date  . Asthma, persistent  not controlled 07/27/2012  . Breast cancer ([place]) 12/28/14   Left BresastDCIS  . COLONIC POLYPS,  HX OF 11/04/2007  . COPD (chronic obstructive pulmonary disease) ([facility])   . GERD 11/04/2007  . GERD (gastroesophageal reflux disease)   . HAIR LOSS 11/04/2007  . Hx of cardiac cath 11/15/2016   a. LHC 11/15/16 showed normal coronaries and EF 50-55% with normal EDP. (done for false positive NST)  . Hx of colonic polyps 1995   adenomatous  . Hyperlipidemia   . HYPERLIPIDEMIA 11/04/2007  . Hypertension   . HYPERTENSION 06/07/2009  . LBBB (left bundle branch block) 07/26/2011  . Personal history of radiation therapy   . S/P radiation therapy 02/02/15-03/24/59   left breast 63GY    PAST SURGICAL HISTORY: Past Surgical History:  Procedure Laterality Date  . ABDOMINAL HYSTERECTOMY    . ANTERIOR FUSION CERVICAL SPINE    . AUGMENTATION MAMMAPLASTY Bilateral   . bilat ear surgury/hearing loss    . BREAST LUMPECTOMY Left 2016  . BREAST LUMPECTOMY WITH RADIOACTIVE SEED LOCALIZATION Left 12/28/2014   Procedure: BREAST LUMPECTOMY WITH RADIOACTIVE SEED LOCALIZATION;  Surgeon: Autumn Messing III, MD;  Location: [facility];  Service: General;  Laterality: Left;  . COLONOSCOPY    . LEFT HEART CATH AND CORONARY ANGIOGRAPHY N/A 11/15/2016   Procedure: LEFT HEART CATH AND CORONARY ANGIOGRAPHY;  Surgeon: Belva Crome, MD;  Location: [facility];  Service: Cardiovascular;  Laterality: N/A;  . POLYPECTOMY      FAMILY HISTORY: Family History  Problem Relation Age of Onset  . Breast cancer Other   . Cancer Paternal Grandmother        breast  . Lung cancer Father   . Breast cancer Maternal Grandmother   . Healthy Mother   . Coronary artery disease Neg Hx   . Colon cancer Neg Hx   . Pancreatic cancer Neg Hx   . Rectal cancer Neg Hx   . Stomach cancer Neg Hx   . Esophageal cancer Neg Hx    [name] died from lung cancer in the setting of tobacco abuse at age 56. [name] died from "old age" at age 87. The patient had one brother, 3 sisters. The brother had" I  cancer" requiring enucleation--the patient does not know whether this was a primary ocular melanoma. [name] was diagnosed with breast cancer but the patient does not know at what age. There is no other history of breast or ovarian cancer in the family   GYNECOLOGIC HISTORY:  No LMP recorded. Patient has had a hysterectomy. Menarche age 89, first live birth age 53. The patient is GX P3. She is status post hysterectomy without salpingo-oophorectomy. She did not take hormone replacement. She did use oral contraceptives remotely without any complications.   SOCIAL HISTORY:  Jheri works in accounts payable. Her husband [name] is disabled because of back problems. [name] is a Marine scientist working in rehabilitation in [place]. [name] lives in [place] where he works as a Games developer. [name] lives in [place] where he works as a Emergency planning/management officer. The patient has 4 grandchildren. She is not a Ambulance person.    ADVANCED DIRECTIVES: Not in place   HEALTH MAINTENANCE: Social History   Tobacco Use  . Smoking status: Former Smoker    Packs/day: 1.00    Years: 20.00    Pack years: 20.00    Types: Cigarettes    Last attempt to quit: 08/19/1989  Years since quitting: 28.8  . Smokeless tobacco: Never Used  Substance Use Topics  . Alcohol use: No  . Drug use: No    Colonoscopy:  PAP:  Bone density:  Lipid panel:  Allergies  Allergen Reactions  . Codeine     REACTION: nausea,  . Tramadol Hcl     Current Outpatient Medications  Medication Sig Dispense Refill  . amLODipine (NORVASC) 5 MG tablet Take 1 tablet (5 mg total) by mouth daily. 90 tablet 3  . aspirin EC 81 MG tablet Take 81 mg by mouth daily.    . cholecalciferol (VITAMIN D3) 25 MCG (1000 UT) tablet Take 1 tablet (1,000 Units total) by mouth daily.    Marland Kitchen doxycycline (VIBRA-TABS) 100 MG tablet Take 1 tablet (100 mg total) by mouth 2 (two) times daily. 20 tablet 0  .  Ipratropium-Albuterol (COMBIVENT) 20-100 MCG/ACT AERS respimat Inhale 1 puff into the lungs every 6 (six) hours. 1 Inhaler 0  . losartan (COZAAR) 100 MG tablet Take 1 tablet (100 mg total) by mouth daily. 90 tablet 3  . palbociclib (IBRANCE) 125 MG capsule Take 1 capsule (125 mg total) by mouth daily. Take after lunch. Take for 21 days on, 7 days off, repeat every 28 days. 21 capsule 6  . Spacer/Aero-Holding Chambers DEVI Spacer to be used with inhaler 1 each 0   No current facility-administered medications for this visit.     OBJECTIVE: Middle-aged white woman who appears stated age  There were no vitals filed for this visit.   There is no height or weight on file to calculate BMI.    ECOG FS:1 - Symptomatic but completely ambulatory  Sclerae unicteric, EOMs intact Wearing a mask No cervical or supraclavicular adenopathy Lungs no rales or rhonchi Heart regular rate and rhythm Abd soft, nontender, positive bowel sounds MSK no focal spinal tenderness, no upper extremity lymphedema Neuro: nonfocal, well oriented, appropriate affect Breasts: Deferred      LAB RESULTS:  CMP     Component Value Date/Time   NA 141 05/26/2018 0946   NA 141 04/25/2017 1213   NA 145 07/04/2016 1133   K 4.0 05/26/2018 0946   K 4.2 07/04/2016 1133   CL 107 05/26/2018 0946   CO2 24 05/26/2018 0946   CO2 28 07/04/2016 1133   GLUCOSE 116 (H) 05/26/2018 0946   GLUCOSE 101 07/04/2016 1133   BUN 19 05/26/2018 0946   BUN 18 04/25/2017 1213   BUN 16.0 07/04/2016 1133   CREATININE 1.32 (H) 05/26/2018 0946   CREATININE 1.53 (H) 04/28/2017 1442   CREATININE 0.9 07/04/2016 1133   CALCIUM 9.2 05/26/2018 0946   CALCIUM 9.9 07/04/2016 1133   PROT 7.3 05/26/2018 0946   PROT 7.3 07/04/2016 1133   ALBUMIN 3.6 05/26/2018 0946   ALBUMIN 3.8 07/04/2016 1133   AST 18 05/26/2018 0946   AST 27 04/28/2017 1442   AST 19 07/04/2016 1133   ALT 14 05/26/2018 0946   ALT 41 04/28/2017 1442   ALT 17 07/04/2016 1133    ALKPHOS 102 05/26/2018 0946   ALKPHOS 172 (H) 07/04/2016 1133   BILITOT 1.2 05/26/2018 0946   BILITOT 0.7 04/28/2017 1442   BILITOT 0.94 07/04/2016 1133   GFRNONAA 43 (L) 05/26/2018 0946   GFRNONAA 36 (L) 04/28/2017 1442   GFRAA 50 (L) 05/26/2018 0946   GFRAA 42 (L) 04/28/2017 1442    INo results found for: SPEP, UPEP  Lab Results  Component Value Date   WBC 3.7 (L) 06/23/2018  NEUTROABS 1.8 06/23/2018   HGB 12.5 06/23/2018   HCT 36.8 06/23/2018   MCV 102.5 (H) 06/23/2018   PLT 234 06/23/2018      Chemistry      Component Value Date/Time   NA 141 05/26/2018 0946   NA 141 04/25/2017 1213   NA 145 07/04/2016 1133   K 4.0 05/26/2018 0946   K 4.2 07/04/2016 1133   CL 107 05/26/2018 0946   CO2 24 05/26/2018 0946   CO2 28 07/04/2016 1133   BUN 19 05/26/2018 0946   BUN 18 04/25/2017 1213   BUN 16.0 07/04/2016 1133   CREATININE 1.32 (H) 05/26/2018 0946   CREATININE 1.53 (H) 04/28/2017 1442   CREATININE 0.9 07/04/2016 1133      Component Value Date/Time   CALCIUM 9.2 05/26/2018 0946   CALCIUM 9.9 07/04/2016 1133   ALKPHOS 102 05/26/2018 0946   ALKPHOS 172 (H) 07/04/2016 1133   AST 18 05/26/2018 0946   AST 27 04/28/2017 1442   AST 19 07/04/2016 1133   ALT 14 05/26/2018 0946   ALT 41 04/28/2017 1442   ALT 17 07/04/2016 1133   BILITOT 1.2 05/26/2018 0946   BILITOT 0.7 04/28/2017 1442   BILITOT 0.94 07/04/2016 1133     No results found for: LABCA2  No components found for: LABCA125  No results for input(s): INR in the last 168 hours.  Urinalysis    Component Value Date/Time   COLORURINE YELLOW 10/20/2017 [place] 10/20/2017 1303   LABSPEC 1.020 10/20/2017 1303   PHURINE 6.0 10/20/2017 1303   GLUCOSEU NEGATIVE 10/20/2017 1303   HGBUR TRACE-INTACT (A) 10/20/2017 1303   BILIRUBINUR NEGATIVE 10/20/2017 1303   KETONESUR NEGATIVE 10/20/2017 1303   PROTEINUR NEGATIVE 05/12/2017 0054   UROBILINOGEN 1.0 10/20/2017 1303   NITRITE NEGATIVE  10/20/2017 1303   LEUKOCYTESUR TRACE (A) 10/20/2017 1303    STUDIES: Today's labs reviewed   ASSESSMENT: 63 y.o. [place] woman status post left lumpectomy 12/28/2014 for ductal carcinoma in situ, high-grade measuring 0.2 cm, estrogen and progesterone receptor positive, with close but negative margins  (1) adjuvant radiation completed 03/24/2015  (2) anastrozole started 04/19/2015   (a) bone density 03/01/2016 shows a T score of -1.2  (3) mildly abnormal hepatic function panel: negative workup for hepatitis B and C, normal alpha-1-anti-trypsin  (4) left lower extremity DVT diagnosed by Doppler ultrasonography 04/16/2016, with negative CT angiogram chest  (a) on Xarelto starting 04/16/2016   (b) negative hypercoagulable panel, with a minimally abnormal (indeterminate) anti-cardiolipin IgM  ((c) stopped rivaroxaban 07/16/2016, repeat D-Dimer WNL August 2018  METASTATIC DISEASE: December 2019: bone only (5) CT chest 02/06/2018 shows a mixed lytic and sclerotic process with fracture at the inferior manubrium but no other bone lesions.  However bone scan 02/16/2018 shows multiple bone lesions consistent with metastatic disease; no lung or pleura involvement, no adenopathy  (a) Bone marrow biopsy on 03/03/2018 shows metastatic carcinoma consistent with breast primary, estrogen and progesterone receptor strongly positive, HER-2 not amplified (1+)  (b) MRI abdomen and MRI brain at [facility] (results viewable in "care everywhere") notes known bone involvement in spine and pelvis, but no other area of metastatic disease in abdomen/pelvis, or brain.  (c) CA 27.29 not informative (37.9 on 03/03/2018)  (6) started denosumab/Xgeva on 03/18/2018, repeated Q28 d  (7) started fulvestrant on 03/03/2018 and palbociclib 03/18/2018 at 125 mg/d 21/7   PLAN: Daiana is tolerating her treatment moderately well.  The plan is to continue current treatment until  there is definitive evidence of disease  recurrence.  We reviewed her bone scan.  It lists what appeared to be new spots but she understands this is really the new baseline bone scan since she is receiving denosumab Delton See which is going to make some lytic lesions "active".  In short healing will look just like a new lesion and that I think is what is happening here.  We will need to repeat a bone scan of course in another 3 or 4 months to make sure we are indeed controlling the disease.  In the meantime her pain is not well controlled.  She is taking ibuprofen 400 mg occasionally.  I suggested she take ibuprofen 400 mg with Tylenol 500 mg 3 times a day with food.  In addition I am referring her to Dr. Lisbeth Renshaw in radiation oncology for a brief course of radiation to the painful bone spots.  I think that will be helpful.  I was surprised to learn that Emelia is still working.  She does not feel she can continue and I agree.  She is going to apply for disability and we will be glad to help her with that.  She knows to call for any other issue that may develop before her next visit here.   Magrinat, Virgie Dad, MD  06/23/18 10:25 AM Medical Oncology and Hematology [facility] [address] [place], [place] [address] Tel. [phone]    Fax. [phone]  I, Jacqualyn Posey am acting as a Education administrator for Chauncey Cruel, MD.    I, Lurline Del MD, have reviewed the above documentation for accuracy and completeness, and I agree with the above.

## 2018-06-23 ENCOUNTER — Inpatient Hospital Stay: Payer: 59

## 2018-06-23 ENCOUNTER — Telehealth: Payer: Self-pay | Admitting: Radiation Oncology

## 2018-06-23 ENCOUNTER — Inpatient Hospital Stay: Payer: 59 | Attending: Oncology | Admitting: Oncology

## 2018-06-23 ENCOUNTER — Other Ambulatory Visit: Payer: Self-pay

## 2018-06-23 VITALS — BP 135/69 | HR 84 | Temp 97.9°F | Resp 18

## 2018-06-23 DIAGNOSIS — Z79899 Other long term (current) drug therapy: Secondary | ICD-10-CM | POA: Diagnosis not present

## 2018-06-23 DIAGNOSIS — Z7982 Long term (current) use of aspirin: Secondary | ICD-10-CM

## 2018-06-23 DIAGNOSIS — C50512 Malignant neoplasm of lower-outer quadrant of left female breast: Secondary | ICD-10-CM

## 2018-06-23 DIAGNOSIS — R530 Neoplastic (malignant) related fatigue: Secondary | ICD-10-CM | POA: Diagnosis not present

## 2018-06-23 DIAGNOSIS — Z7951 Long term (current) use of inhaled steroids: Secondary | ICD-10-CM | POA: Diagnosis not present

## 2018-06-23 DIAGNOSIS — Z86718 Personal history of other venous thrombosis and embolism: Secondary | ICD-10-CM | POA: Insufficient documentation

## 2018-06-23 DIAGNOSIS — Z5111 Encounter for antineoplastic chemotherapy: Secondary | ICD-10-CM | POA: Diagnosis present

## 2018-06-23 DIAGNOSIS — G893 Neoplasm related pain (acute) (chronic): Secondary | ICD-10-CM | POA: Diagnosis not present

## 2018-06-23 DIAGNOSIS — Z87891 Personal history of nicotine dependence: Secondary | ICD-10-CM | POA: Diagnosis not present

## 2018-06-23 DIAGNOSIS — K219 Gastro-esophageal reflux disease without esophagitis: Secondary | ICD-10-CM | POA: Insufficient documentation

## 2018-06-23 DIAGNOSIS — I10 Essential (primary) hypertension: Secondary | ICD-10-CM | POA: Insufficient documentation

## 2018-06-23 DIAGNOSIS — Z170 Estrogen receptor positive status [ER+]: Secondary | ICD-10-CM

## 2018-06-23 DIAGNOSIS — C7951 Secondary malignant neoplasm of bone: Secondary | ICD-10-CM | POA: Diagnosis not present

## 2018-06-23 DIAGNOSIS — Z803 Family history of malignant neoplasm of breast: Secondary | ICD-10-CM | POA: Insufficient documentation

## 2018-06-23 DIAGNOSIS — J449 Chronic obstructive pulmonary disease, unspecified: Secondary | ICD-10-CM | POA: Diagnosis not present

## 2018-06-23 DIAGNOSIS — Z923 Personal history of irradiation: Secondary | ICD-10-CM

## 2018-06-23 DIAGNOSIS — Z9882 Breast implant status: Secondary | ICD-10-CM | POA: Diagnosis not present

## 2018-06-23 DIAGNOSIS — I824Z2 Acute embolism and thrombosis of unspecified deep veins of left distal lower extremity: Secondary | ICD-10-CM

## 2018-06-23 DIAGNOSIS — Z801 Family history of malignant neoplasm of trachea, bronchus and lung: Secondary | ICD-10-CM | POA: Diagnosis not present

## 2018-06-23 LAB — CBC WITH DIFFERENTIAL/PLATELET
Abs Immature Granulocytes: 0.02 10*3/uL (ref 0.00–0.07)
Basophils Absolute: 0 10*3/uL (ref 0.0–0.1)
Basophils Relative: 1 %
Eosinophils Absolute: 0.1 10*3/uL (ref 0.0–0.5)
Eosinophils Relative: 2 %
HCT: 36.8 % (ref 36.0–46.0)
Hemoglobin: 12.5 g/dL (ref 12.0–15.0)
Immature Granulocytes: 1 %
Lymphocytes Relative: 41 %
Lymphs Abs: 1.5 10*3/uL (ref 0.7–4.0)
MCH: 34.8 pg — ABNORMAL HIGH (ref 26.0–34.0)
MCHC: 34 g/dL (ref 30.0–36.0)
MCV: 102.5 fL — ABNORMAL HIGH (ref 80.0–100.0)
Monocytes Absolute: 0.3 10*3/uL (ref 0.1–1.0)
Monocytes Relative: 7 %
Neutro Abs: 1.8 10*3/uL (ref 1.7–7.7)
Neutrophils Relative %: 48 %
Platelets: 234 10*3/uL (ref 150–400)
RBC: 3.59 MIL/uL — ABNORMAL LOW (ref 3.87–5.11)
RDW: 15.7 % — ABNORMAL HIGH (ref 11.5–15.5)
WBC: 3.7 10*3/uL — ABNORMAL LOW (ref 4.0–10.5)
nRBC: 0 % (ref 0.0–0.2)

## 2018-06-23 LAB — COMPREHENSIVE METABOLIC PANEL
ALT: 12 U/L (ref 0–44)
AST: 20 U/L (ref 15–41)
Albumin: 3.7 g/dL (ref 3.5–5.0)
Alkaline Phosphatase: 99 U/L (ref 38–126)
Anion gap: 11 (ref 5–15)
BUN: 19 mg/dL (ref 8–23)
CO2: 25 mmol/L (ref 22–32)
Calcium: 9 mg/dL (ref 8.9–10.3)
Chloride: 107 mmol/L (ref 98–111)
Creatinine, Ser: 1.45 mg/dL — ABNORMAL HIGH (ref 0.44–1.00)
GFR calc Af Amer: 45 mL/min — ABNORMAL LOW (ref >60)
GFR calc non Af Amer: 39 mL/min — ABNORMAL LOW (ref >60)
Glucose, Bld: 90 mg/dL (ref 70–99)
Potassium: 4.6 mmol/L (ref 3.5–5.1)
Sodium: 143 mmol/L (ref 135–145)
Total Bilirubin: 0.9 mg/dL (ref 0.3–1.2)
Total Protein: 7.1 g/dL (ref 6.5–8.1)

## 2018-06-23 MED ORDER — FULVESTRANT 250 MG/5ML IM SOLN
INTRAMUSCULAR | Status: AC
  Filled 2018-06-23: qty 5

## 2018-06-23 MED ORDER — DENOSUMAB 120 MG/1.7ML ~~LOC~~ SOLN
120.0000 mg | Freq: Once | SUBCUTANEOUS | Status: AC
  Administered 2018-06-23: 120 mg via SUBCUTANEOUS

## 2018-06-23 MED ORDER — FULVESTRANT 250 MG/5ML IM SOLN
500.0000 mg | Freq: Once | INTRAMUSCULAR | Status: AC
  Administered 2018-06-23: 500 mg via INTRAMUSCULAR

## 2018-06-23 MED ORDER — DENOSUMAB 120 MG/1.7ML ~~LOC~~ SOLN
SUBCUTANEOUS | Status: AC
  Filled 2018-06-23: qty 1.7

## 2018-06-23 NOTE — Telephone Encounter (Signed)
New Message:     Called patient to schedule from referral received. Unable to leave a message due to patient not having a voicemail set up.

## 2018-06-23 NOTE — Patient Instructions (Signed)
Denosumab injection What is this medicine? DENOSUMAB (den oh sue mab) slows bone breakdown. Prolia is used to treat osteoporosis in women after menopause and in men, and in people who are taking corticosteroids for 6 months or more. Xgeva is used to treat a high calcium level due to cancer and to prevent bone fractures and other bone problems caused by multiple myeloma or cancer bone metastases. Xgeva is also used to treat giant cell tumor of the bone. This medicine may be used for other purposes; ask your health care provider or pharmacist if you have questions. COMMON BRAND NAME(S): Prolia, XGEVA What should I tell my health care provider before I take this medicine? They need to know if you have any of these conditions: -dental disease -having surgery or tooth extraction -infection -kidney disease -low levels of calcium or Vitamin D in the blood -malnutrition -on hemodialysis -skin conditions or sensitivity -thyroid or parathyroid disease -an unusual reaction to denosumab, other medicines, foods, dyes, or preservatives -pregnant or trying to get pregnant -breast-feeding How should I use this medicine? This medicine is for injection under the skin. It is given by a health care professional in a hospital or clinic setting. A special MedGuide will be given to you before each treatment. Be sure to read this information carefully each time. For Prolia, talk to your pediatrician regarding the use of this medicine in children. Special care may be needed. For Xgeva, talk to your pediatrician regarding the use of this medicine in children. While this drug may be prescribed for children as young as 13 years for selected conditions, precautions do apply. Overdosage: If you think you have taken too much of this medicine contact a poison control center or emergency room at once. NOTE: This medicine is only for you. Do not share this medicine with others. What if I miss a dose? It is important not to  miss your dose. Call your doctor or health care professional if you are unable to keep an appointment. What may interact with this medicine? Do not take this medicine with any of the following medications: -other medicines containing denosumab This medicine may also interact with the following medications: -medicines that lower your chance of fighting infection -steroid medicines like prednisone or cortisone This list may not describe all possible interactions. Give your health care provider a list of all the medicines, herbs, non-prescription drugs, or dietary supplements you use. Also tell them if you smoke, drink alcohol, or use illegal drugs. Some items may interact with your medicine. What should I watch for while using this medicine? Visit your doctor or health care professional for regular checks on your progress. Your doctor or health care professional may order blood tests and other tests to see how you are doing. Call your doctor or health care professional for advice if you get a fever, chills or sore throat, or other symptoms of a cold or flu. Do not treat yourself. This drug may decrease your body's ability to fight infection. Try to avoid being around people who are sick. You should make sure you get enough calcium and vitamin D while you are taking this medicine, unless your doctor tells you not to. Discuss the foods you eat and the vitamins you take with your health care professional. See your dentist regularly. Brush and floss your teeth as directed. Before you have any dental work done, tell your dentist you are receiving this medicine. Do not become pregnant while taking this medicine or for 5 months   after stopping it. Talk with your doctor or health care professional about your birth control options while taking this medicine. Women should inform their doctor if they wish to become pregnant or think they might be pregnant. There is a potential for serious side effects to an unborn  child. Talk to your health care professional or pharmacist for more information. What side effects may I notice from receiving this medicine? Side effects that you should report to your doctor or health care professional as soon as possible: -allergic reactions like skin rash, itching or hives, swelling of the face, lips, or tongue -bone pain -breathing problems -dizziness -jaw pain, especially after dental work -redness, blistering, peeling of the skin -signs and symptoms of infection like fever or chills; cough; sore throat; pain or trouble passing urine -signs of low calcium like fast heartbeat, muscle cramps or muscle pain; pain, tingling, numbness in the hands or feet; seizures -unusual bleeding or bruising -unusually weak or tired Side effects that usually do not require medical attention (report to your doctor or health care professional if they continue or are bothersome): -constipation -diarrhea -headache -joint pain -loss of appetite -muscle pain -runny nose -tiredness -upset stomach This list may not describe all possible side effects. Call your doctor for medical advice about side effects. You may report side effects to FDA at 1-800-FDA-1088. Where should I keep my medicine? This medicine is only given in a clinic, doctor's office, or other health care setting and will not be stored at home. NOTE: This sheet is a summary. It may not cover all possible information. If you have questions about this medicine, talk to your doctor, pharmacist, or health care provider.  2019 Elsevier/Gold Standard (2017-06-20 16:10:44) Fulvestrant injection What is this medicine? FULVESTRANT (ful VES trant) blocks the effects of estrogen. It is used to treat breast cancer. This medicine may be used for other purposes; ask your health care provider or pharmacist if you have questions. COMMON BRAND NAME(S): FASLODEX What should I tell my health care provider before I take this medicine? They  need to know if you have any of these conditions: -bleeding disorders -liver disease -low blood counts, like low white cell, platelet, or red cell counts -an unusual or allergic reaction to fulvestrant, other medicines, foods, dyes, or preservatives -pregnant or trying to get pregnant -breast-feeding How should I use this medicine? This medicine is for injection into a muscle. It is usually given by a health care professional in a hospital or clinic setting. Talk to your pediatrician regarding the use of this medicine in children. Special care may be needed. Overdosage: If you think you have taken too much of this medicine contact a poison control center or emergency room at once. NOTE: This medicine is only for you. Do not share this medicine with others. What if I miss a dose? It is important not to miss your dose. Call your doctor or health care professional if you are unable to keep an appointment. What may interact with this medicine? -medicines that treat or prevent blood clots like warfarin, enoxaparin, dalteparin, apixaban, dabigatran, and rivaroxaban This list may not describe all possible interactions. Give your health care provider a list of all the medicines, herbs, non-prescription drugs, or dietary supplements you use. Also tell them if you smoke, drink alcohol, or use illegal drugs. Some items may interact with your medicine. What should I watch for while using this medicine? Your condition will be monitored carefully while you are receiving this medicine. You   will need important blood work done while you are taking this medicine. Do not become pregnant while taking this medicine or for at least 1 year after stopping it. Women of child-bearing potential will need to have a negative pregnancy test before starting this medicine. Women should inform their doctor if they wish to become pregnant or think they might be pregnant. There is a potential for serious side effects to an unborn  child. Men should inform their doctors if they wish to father a child. This medicine may lower sperm counts. Talk to your health care professional or pharmacist for more information. Do not breast-feed an infant while taking this medicine or for 1 year after the last dose. What side effects may I notice from receiving this medicine? Side effects that you should report to your doctor or health care professional as soon as possible: -allergic reactions like skin rash, itching or hives, swelling of the face, lips, or tongue -feeling faint or lightheaded, falls -pain, tingling, numbness, or weakness in the legs -signs and symptoms of infection like fever or chills; cough; flu-like symptoms; sore throat -vaginal bleeding Side effects that usually do not require medical attention (report to your doctor or health care professional if they continue or are bothersome): -aches, pains -constipation -diarrhea -headache -hot flashes -nausea, vomiting -pain at site where injected -stomach pain This list may not describe all possible side effects. Call your doctor for medical advice about side effects. You may report side effects to FDA at 1-800-FDA-1088. Where should I keep my medicine? This drug is given in a hospital or clinic and will not be stored at home. NOTE: This sheet is a summary. It may not cover all possible information. If you have questions about this medicine, talk to your doctor, pharmacist, or health care provider.  2019 Elsevier/Gold Standard (2017-05-22 11:34:41)  

## 2018-06-24 ENCOUNTER — Telehealth: Payer: Self-pay | Admitting: Oncology

## 2018-06-24 NOTE — Telephone Encounter (Signed)
tried to reach no voicemail

## 2018-06-25 ENCOUNTER — Ambulatory Visit
Admission: RE | Admit: 2018-06-25 | Discharge: 2018-06-25 | Disposition: A | Discharge status: Home / Self Care | Payer: 59 | Source: Ambulatory Visit | Attending: Radiation Oncology | Admitting: Radiation Oncology

## 2018-06-25 ENCOUNTER — Ambulatory Visit: Payer: 59

## 2018-06-25 ENCOUNTER — Other Ambulatory Visit: Payer: Self-pay

## 2018-06-25 DIAGNOSIS — G893 Neoplasm related pain (acute) (chronic): Secondary | ICD-10-CM

## 2018-06-25 DIAGNOSIS — Z170 Estrogen receptor positive status [ER+]: Principal | ICD-10-CM

## 2018-06-25 DIAGNOSIS — C50512 Malignant neoplasm of lower-outer quadrant of left female breast: Secondary | ICD-10-CM

## 2018-06-25 DIAGNOSIS — C7951 Secondary malignant neoplasm of bone: Secondary | ICD-10-CM

## 2018-06-25 DIAGNOSIS — D0512 Intraductal carcinoma in situ of left breast: Secondary | ICD-10-CM | POA: Diagnosis not present
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
## 2018-06-26 ENCOUNTER — Telehealth: Payer: Self-pay | Admitting: Internal Medicine

## 2018-06-26 ENCOUNTER — Ambulatory Visit
Admission: RE | Admit: 2018-06-26 | Discharge: 2018-06-26 | Disposition: A | Discharge status: Home / Self Care | Payer: 59 | Source: Ambulatory Visit | Attending: Radiation Oncology | Admitting: Radiation Oncology

## 2018-06-26 ENCOUNTER — Other Ambulatory Visit: Payer: Self-pay

## 2018-06-26 DIAGNOSIS — Z510 Encounter for antineoplastic radiation therapy: Secondary | ICD-10-CM | POA: Insufficient documentation

## 2018-06-26 DIAGNOSIS — C801 Malignant (primary) neoplasm, unspecified: Secondary | ICD-10-CM | POA: Diagnosis not present

## 2018-06-26 DIAGNOSIS — C7951 Secondary malignant neoplasm of bone: Secondary | ICD-10-CM | POA: Insufficient documentation

## 2018-06-26 NOTE — Telephone Encounter (Signed)
I think she may mean hydrocodone; can we setup a Virtual Visit for Mon AM?  thanks

## 2018-06-26 NOTE — Telephone Encounter (Signed)
Copied from [place] [id number]. Topic: Quick Communication - See Telephone Encounter >> Jun 26, 2018  3:35 PM Rutherford Nail, NT wrote: CRM for notification. See Telephone encounter for: 06/26/18. Patient calling and states that her oncology doctor told her to call her PCP in regards to getting a medication. States that she is unsure the name of the medication, but it is used for pain. States she is having rib pain. States that Dr Jenny Reichmann has prescribed it before and it really helped. Please advise.  CB#: [phone] or [place]

## 2018-06-29 ENCOUNTER — Ambulatory Visit (INDEPENDENT_AMBULATORY_CARE_PROVIDER_SITE_OTHER): Payer: 59 | Admitting: Internal Medicine

## 2018-06-29 ENCOUNTER — Encounter: Payer: Self-pay | Admitting: Internal Medicine

## 2018-06-29 DIAGNOSIS — I10 Essential (primary) hypertension: Secondary | ICD-10-CM | POA: Diagnosis not present

## 2018-06-29 DIAGNOSIS — J4520 Mild intermittent asthma, uncomplicated: Secondary | ICD-10-CM

## 2018-06-29 DIAGNOSIS — C7951 Secondary malignant neoplasm of bone: Secondary | ICD-10-CM

## 2018-06-29 DIAGNOSIS — Z0001 Encounter for general adult medical examination with abnormal findings: Secondary | ICD-10-CM | POA: Diagnosis not present

## 2018-06-29 DIAGNOSIS — G893 Neoplasm related pain (acute) (chronic): Secondary | ICD-10-CM | POA: Diagnosis not present

## 2018-06-29 MED ORDER — HYDROCODONE-ACETAMINOPHEN 7.5-325 MG PO TABS: 1.0000 | ORAL_TABLET | Freq: Every day | ORAL | 0 refills | Status: DC

## 2018-06-29 MED ORDER — HYDROCODONE-ACETAMINOPHEN 7.5-325 MG PO TABS: 1.0000 | ORAL_TABLET | Freq: Every evening | ORAL | 0 refills | Status: DC | PRN

## 2018-06-29 MED ORDER — AMLODIPINE BESYLATE 2.5 MG PO TABS: 2.5000 mg | ORAL_TABLET | Freq: Every day | ORAL | 3 refills | Status: DC

## 2018-06-29 NOTE — Patient Instructions (Signed)
Please take all new medication as prescribed - the hydrocodone for pain at bedtime  OK to continue the tylenol as needed for pain during the day  OK to decrease the Amlodipine to 2.5 mg (half pill), but please check BP at home on a regular basis, with the goal being to be less than 140/90.  If you have weight loss and lower blood pressures such as less than 110/70, the amlodipine can be stopped  Please continue all other medications as before, and refills have been done if requested.  Please have the pharmacy call with any other refills you may need.  Please continue your efforts at being more active, low cholesterol diet, and weight control.  You are otherwise up to date with prevention measures today.  Please keep your appointments with your specialists as you may have planned - the XRT for May 6  We can hold on further lab work for now  Please return in 6 months, or sooner if needed

## 2018-06-29 NOTE — Telephone Encounter (Signed)
Virtual visit scheduled today 06/29/18@ 9:20 am. Text sent to pt @ [phone].

## 2018-06-29 NOTE — Assessment & Plan Note (Signed)
With ankle swelling likely from the amlodipine 5 mg; ok to decrease to 2.5 mg daily and f/u BP at home and next visit; consider stopping the amlodipine if conts to lose wt and BP becomes < 110/90

## 2018-06-29 NOTE — Progress Notes (Signed)
Patient ID: Caitlin Wall, female   DOB: Jul 28, 1955, 63 y.o.   MRN: [id number]  Virtual Visit via Video Note  I connected with Caitlin Wall on 06/29/18 at  9:20 AM EDT by a video enabled telemedicine application and verified that I am speaking with the correct person using two identifiers.  Location: Patient: at home Provider: at office   I discussed the limitations of evaluation and management by telemedicine and the availability of in person appointments. The patient expressed understanding and agreed to proceed.  History of Present Illness: Here for wellness and f/u;  Overall doing ok;  Pt denies Chest pain, worsening SOB, DOE, wheezing, orthopnea, PND, palpitations, dizziness or syncope.  Pt denies neurological change such as new headache, facial or extremity weakness.  Pt denies polydipsia, polyuria, or low sugar symptoms. Pt states overall good compliance with treatment and medications, good tolerability, and has been trying to follow appropriate diet.  Pt denies worsening depressive symptoms, suicidal ideation or panic. No fever, night sweats, wt loss, loss of appetite, or other constitutional symptoms.  Pt states good ability with ADL's, has low fall risk, home safety reviewed and adequate, no other significant changes in hearing or vision, and only occasionally active with exercise. Also c/o bilat ankle edema worse x 3 wks for unclear reasons, but did stop taking her amlodipine a few days ago and now swelling much improved even later in the day; overall mild, intermittent, nothing else makes better or worse Also c/o multiple areas of rib and sternal pain with known recent sternal fx and rib metastases breast cancer, seems tolerable during the day with tylenol and ibuprofen, but just cant sleep well at night with this.  Nothing else makes better or worse.  Of note has XRT for rib and spine mets to start May 6. Not checked her BP at home recently, but is able with cuff at home.    Past Medical History:  Diagnosis Date  . Asthma, persistent not controlled 07/27/2012  . Breast cancer ([place]) 12/28/14   Left BresastDCIS  . COLONIC POLYPS, HX OF 11/04/2007  . COPD (chronic obstructive pulmonary disease) ([place])   . GERD 11/04/2007  . GERD (gastroesophageal reflux disease)   . HAIR LOSS 11/04/2007  . Hx of cardiac cath 11/15/2016   a. LHC 11/15/16 showed normal coronaries and EF 50-55% with normal EDP. (done for false positive NST)  . Hx of colonic polyps 1995   adenomatous  . Hyperlipidemia   . HYPERLIPIDEMIA 11/04/2007  . Hypertension   . HYPERTENSION 06/07/2009  . LBBB (left bundle branch block) 07/26/2011  . Personal history of radiation therapy   . S/P radiation therapy 02/02/15-03/24/15   left breast 60.4GY   Past Surgical History:  Procedure Laterality Date  . ABDOMINAL HYSTERECTOMY    . ANTERIOR FUSION CERVICAL SPINE    . AUGMENTATION MAMMAPLASTY Bilateral   . bilat ear surgury/hearing loss    . BREAST LUMPECTOMY Left 2016  . BREAST LUMPECTOMY WITH RADIOACTIVE SEED LOCALIZATION Left 12/28/2014   Procedure: BREAST LUMPECTOMY WITH RADIOACTIVE SEED LOCALIZATION;  Surgeon: Autumn Messing III, MD;  Location: [facility];  Service: General;  Laterality: Left;  . COLONOSCOPY    . LEFT HEART CATH AND CORONARY ANGIOGRAPHY N/A 11/15/2016   Procedure: LEFT HEART CATH AND CORONARY ANGIOGRAPHY;  Surgeon: Belva Crome, MD;  Location: [facility];  Service: Cardiovascular;  Laterality: N/A;  . POLYPECTOMY      reports that she quit smoking about 28  years ago. Her smoking use included cigarettes. She has a 20.00 pack-year smoking history. She has never used smokeless tobacco. She reports that she does not drink alcohol or use drugs. family history includes Breast cancer in her maternal grandmother and another family member; Cancer in her paternal grandmother; Healthy in her mother; Lung cancer in her father. Allergies  Allergen Reactions  . Codeine     REACTION:  nausea,  . Tramadol Hcl    Current Outpatient Medications on File Prior to Visit  Medication Sig Dispense Refill  . aspirin EC 81 MG tablet Take 81 mg by mouth daily.    . cholecalciferol (VITAMIN D3) 25 MCG (1000 UT) tablet Take 1 tablet (1,000 Units total) by mouth daily.    . Ipratropium-Albuterol (COMBIVENT) 20-100 MCG/ACT AERS respimat Inhale 1 puff into the lungs every 6 (six) hours. 1 Inhaler 0  . losartan (COZAAR) 100 MG tablet Take 1 tablet (100 mg total) by mouth daily. 90 tablet 3  . palbociclib (IBRANCE) 125 MG capsule Take 1 capsule (125 mg total) by mouth daily. Take after lunch. Take for 21 days on, 7 days off, repeat every 28 days. 21 capsule 6  . Spacer/Aero-Holding Chambers DEVI Spacer to be used with inhaler 1 each 0   No current facility-administered medications on file prior to visit.     Observations/Objective: Alert, NAD, appropriate mood and affect except tearful with discussing her pain, resps normal, cn 2-12 intact, moves all 4s, no visible rash or swelling Lab Results  Component Value Date   WBC 3.7 (L) 06/23/2018   HGB 12.5 06/23/2018   HCT 36.8 06/23/2018   PLT 234 06/23/2018   GLUCOSE 90 06/23/2018   CHOL 164 10/20/2017   TRIG 99.0 10/20/2017   HDL 39.20 10/20/2017   LDLCALC 105 (H) 10/20/2017   ALT 12 06/23/2018   AST 20 06/23/2018   NA 143 06/23/2018   K 4.6 06/23/2018   CL 107 06/23/2018   CREATININE 1.45 (H) 06/23/2018   BUN 19 06/23/2018   CO2 25 06/23/2018   TSH 1.71 10/20/2017   INR 1.0 11/12/2016   Assessment and Plan: See notes  Follow Up Instructions: See notes   I discussed the assessment and treatment plan with the patient. The patient was provided an opportunity to ask questions and all were answered. The patient agreed with the plan and demonstrated an understanding of the instructions.   The patient was advised to call back or seek an in-person evaluation if the symptoms worsen or if the condition fails to improve as  anticipated.   [name], MD

## 2018-06-29 NOTE — Assessment & Plan Note (Signed)
stable overall by history and exam, recent data reviewed with pt, and pt to continue medical treatment as before,  to f/u any worsening symptoms or concerns  

## 2018-06-29 NOTE — Assessment & Plan Note (Signed)

## 2018-06-29 NOTE — Assessment & Plan Note (Addendum)
[place] for hydrocodone 7.5/325 qhs prn, f/u xrt as planned  In addition to the time spent performing CPE, I spent an additional 15 minutes face to face,in which greater than 50% of this time was spent in counseling and coordination of care for patient's illness as documented, including the differential dx, treatment, further evaluation and other management of bone metastasis pain, HTN with edema, and asthma

## 2018-06-30 DIAGNOSIS — Z510 Encounter for antineoplastic radiation therapy: Secondary | ICD-10-CM | POA: Diagnosis not present

## 2018-07-01 ENCOUNTER — Ambulatory Visit
Admission: RE | Admit: 2018-07-01 | Discharge: 2018-07-01 | Disposition: A | Discharge status: Home / Self Care | Payer: 59 | Source: Ambulatory Visit | Attending: Radiation Oncology | Admitting: Radiation Oncology

## 2018-07-01 ENCOUNTER — Other Ambulatory Visit: Payer: Self-pay

## 2018-07-01 DIAGNOSIS — Z510 Encounter for antineoplastic radiation therapy: Secondary | ICD-10-CM | POA: Diagnosis not present

## 2018-07-02 ENCOUNTER — Ambulatory Visit
Admission: RE | Admit: 2018-07-02 | Discharge: 2018-07-02 | Disposition: A | Discharge status: Home / Self Care | Payer: 59 | Source: Ambulatory Visit | Attending: Radiation Oncology | Admitting: Radiation Oncology

## 2018-07-02 ENCOUNTER — Other Ambulatory Visit: Payer: Self-pay

## 2018-07-02 ENCOUNTER — Telehealth: Payer: Self-pay

## 2018-07-02 DIAGNOSIS — Z510 Encounter for antineoplastic radiation therapy: Secondary | ICD-10-CM | POA: Diagnosis not present

## 2018-07-02 NOTE — Telephone Encounter (Signed)
Oral Oncology Patient Advocate Encounter  Received notification from [employer] that prior authorization for Ibrance tablets is required.  PA submitted on CoverMyMeds Key [id number] Status is pending  Oral Oncology Clinic will continue to follow.  [place] Patient [place] Phone [phone] Fax [phone] 07/02/2018    9:07 AM

## 2018-07-02 NOTE — Telephone Encounter (Signed)
Oral Oncology Patient Advocate Encounter  Prior Authorization for Ibrance Tablets has been approved.    PA# [id number] Effective dates: 07/02/18 through 07/02/19  Oral Oncology Clinic will continue to follow.   [place] Patient [place] Phone [phone] Fax [phone] 07/02/2018    9:10 AM

## 2018-07-03 ENCOUNTER — Telehealth: Payer: Self-pay | Admitting: Oncology

## 2018-07-03 ENCOUNTER — Telehealth: Payer: Self-pay | Admitting: *Deleted

## 2018-07-03 ENCOUNTER — Other Ambulatory Visit: Payer: Self-pay

## 2018-07-03 ENCOUNTER — Ambulatory Visit
Admission: RE | Admit: 2018-07-03 | Discharge: 2018-07-03 | Disposition: A | Discharge status: Home / Self Care | Payer: 59 | Source: Ambulatory Visit | Attending: Radiation Oncology | Admitting: Radiation Oncology

## 2018-07-03 DIAGNOSIS — Z510 Encounter for antineoplastic radiation therapy: Secondary | ICD-10-CM | POA: Diagnosis not present

## 2018-07-03 NOTE — Telephone Encounter (Signed)
Patient called regarding rescheduling an appointment, patient changed appointment time in July and August appointments.

## 2018-07-03 NOTE — Telephone Encounter (Signed)
This RN spoke with pt per her call stating she has developed 2 mouth sores and has one more starting .  " there is one on the back right that is really sore - even makes my jaw sore - brings tears to my eyes "  Pt is currently on d2 of off week of Ibrance.  Sores are ulcer like in description - no white coating- sores are in her mouth - none on her lips.  Above discussed- MMW- called to verified pharmacy.  Noted pt is coming in daily for radiation therapy- this RN asked pt to stop by post radiation for quick physical assessment of jaw area per noted concern and possible further recommendations. Caitlin Wall will come over to Breast Med Onc offices and asked for this RN.  Caitlin Wall verbalized understanding of above.

## 2018-07-06 ENCOUNTER — Ambulatory Visit
Admission: RE | Admit: 2018-07-06 | Discharge: 2018-07-06 | Disposition: A | Discharge status: Home / Self Care | Payer: 59 | Source: Ambulatory Visit | Attending: Radiation Oncology | Admitting: Radiation Oncology

## 2018-07-06 ENCOUNTER — Telehealth: Payer: Self-pay | Admitting: *Deleted

## 2018-07-06 ENCOUNTER — Other Ambulatory Visit: Payer: Self-pay

## 2018-07-06 DIAGNOSIS — Z510 Encounter for antineoplastic radiation therapy: Secondary | ICD-10-CM | POA: Diagnosis not present

## 2018-07-06 MED ORDER — VALACYCLOVIR HCL 500 MG PO TABS: 500.0000 mg | ORAL_TABLET | Freq: Two times a day (BID) | ORAL | 1 refills | Status: DC

## 2018-07-06 NOTE — Telephone Encounter (Signed)
Addysyn stopped by this AM post radiation- with noted improvement in mouth ulcers since instituting MMW.  Noted swelling on jaw almost resolved and pt is able to eat without issues of sores.  Above reviewed with MD who requested in addition to MMW for pt to use Valtrex.  Discussed with pt and prescription sent to verified pharmacy.

## 2018-07-07 ENCOUNTER — Ambulatory Visit
Admission: RE | Admit: 2018-07-07 | Discharge: 2018-07-07 | Disposition: A | Discharge status: Home / Self Care | Payer: 59 | Source: Ambulatory Visit | Attending: Radiation Oncology | Admitting: Radiation Oncology

## 2018-07-07 ENCOUNTER — Other Ambulatory Visit: Payer: Self-pay

## 2018-07-07 DIAGNOSIS — Z510 Encounter for antineoplastic radiation therapy: Secondary | ICD-10-CM | POA: Diagnosis not present

## 2018-07-08 ENCOUNTER — Other Ambulatory Visit: Payer: Self-pay

## 2018-07-08 ENCOUNTER — Ambulatory Visit
Admission: RE | Admit: 2018-07-08 | Discharge: 2018-07-08 | Disposition: A | Discharge status: Home / Self Care | Payer: 59 | Source: Ambulatory Visit | Attending: Radiation Oncology | Admitting: Radiation Oncology

## 2018-07-08 DIAGNOSIS — Z510 Encounter for antineoplastic radiation therapy: Secondary | ICD-10-CM | POA: Diagnosis not present

## 2018-07-09 ENCOUNTER — Other Ambulatory Visit: Payer: Self-pay

## 2018-07-09 ENCOUNTER — Ambulatory Visit
Admission: RE | Admit: 2018-07-09 | Discharge: 2018-07-09 | Disposition: A | Discharge status: Home / Self Care | Payer: 59 | Source: Ambulatory Visit | Attending: Radiation Oncology | Admitting: Radiation Oncology

## 2018-07-09 DIAGNOSIS — Z510 Encounter for antineoplastic radiation therapy: Secondary | ICD-10-CM | POA: Diagnosis not present

## 2018-07-10 ENCOUNTER — Ambulatory Visit
Admission: RE | Admit: 2018-07-10 | Discharge: 2018-07-10 | Disposition: A | Discharge status: Home / Self Care | Payer: 59 | Source: Ambulatory Visit | Attending: Radiation Oncology | Admitting: Radiation Oncology

## 2018-07-10 ENCOUNTER — Other Ambulatory Visit: Payer: Self-pay | Admitting: Radiation Oncology

## 2018-07-10 ENCOUNTER — Other Ambulatory Visit: Payer: Self-pay

## 2018-07-10 DIAGNOSIS — Z510 Encounter for antineoplastic radiation therapy: Secondary | ICD-10-CM | POA: Diagnosis not present

## 2018-07-10 MED ORDER — SUCRALFATE 1 G PO TABS: 1.0000 g | ORAL_TABLET | Freq: Four times a day (QID) | ORAL | 1 refills | Status: DC

## 2018-07-13 ENCOUNTER — Other Ambulatory Visit: Payer: Self-pay

## 2018-07-13 ENCOUNTER — Ambulatory Visit
Admission: RE | Admit: 2018-07-13 | Discharge: 2018-07-13 | Disposition: A | Discharge status: Home / Self Care | Payer: 59 | Source: Ambulatory Visit | Attending: Radiation Oncology | Admitting: Radiation Oncology

## 2018-07-13 DIAGNOSIS — Z510 Encounter for antineoplastic radiation therapy: Secondary | ICD-10-CM | POA: Diagnosis not present

## 2018-07-14 ENCOUNTER — Ambulatory Visit
Admission: RE | Admit: 2018-07-14 | Discharge: 2018-07-14 | Disposition: A | Discharge status: Home / Self Care | Payer: 59 | Source: Ambulatory Visit | Attending: Radiation Oncology | Admitting: Radiation Oncology

## 2018-07-14 ENCOUNTER — Other Ambulatory Visit: Payer: Self-pay

## 2018-07-14 ENCOUNTER — Encounter: Payer: Self-pay | Admitting: Radiation Oncology

## 2018-07-14 DIAGNOSIS — Z510 Encounter for antineoplastic radiation therapy: Secondary | ICD-10-CM | POA: Diagnosis not present

## 2018-07-14 NOTE — Progress Notes (Signed)
  Radiation Oncology         [phone] ________________________________  Name: Caitlin Wall MRN: [id number]  Date: 06/26/2018  DOB: 11/10/1955  SIMULATION AND TREATMENT PLANNING NOTE  DIAGNOSIS:     ICD-10-CM   1. Bone metastases ([place]) C79.51      Site:   1.  T7/8 spine 2.  Left 8th rib 3.  Right 8th rib  NARRATIVE:  The patient was brought to the [facility].  Identity was confirmed.  All relevant records and images related to the planned course of therapy were reviewed.   Written consent to proceed with treatment was confirmed which was freely given after reviewing the details related to the planned course of therapy had been reviewed with the patient.  Then, the patient was set-up in a stable reproducible  supine position for radiation therapy.  CT images were obtained.  Surface markings were placed.    Ttreatment device(s) for immobilization:  wingboard  The CT images were loaded into the planning software.  Then the target and avoidance structures were contoured.  Treatment planning then occurred.  The radiation prescription was entered and confirmed.  A total of 8 complex treatment devices were fabricated which relate to the designed radiation treatment fields. Each of these customized fields/ complex treatment devices will be used on a daily basis during the radiation course. I have requested : 3D Simulation  I have requested a DVH of the following structures: target volume, lungs, cord.   PLAN:  The patient will receive 30 Gy in 10 fractions to each of the 3 separate target areas.  ________________________________   Jodelle Gross, MD, PhD

## 2018-07-17 ENCOUNTER — Telehealth: Payer: Self-pay

## 2018-07-17 MED ORDER — HYDROCODONE-ACETAMINOPHEN 7.5-325 MG PO TABS: 1.0000 | ORAL_TABLET | Freq: Every day | ORAL | 0 refills | Status: DC

## 2018-07-17 NOTE — Telephone Encounter (Signed)
Copied from [facility] [id number]. Topic: General - Other >> Jul 17, 2018  3:00 PM Carolyn Stare wrote: Pt req a refill on the below medication   HYDROcodone-acetaminophen (NORCO) 7.5-325 MG tablet   [facility]

## 2018-07-17 NOTE — Telephone Encounter (Signed)
Done erx 

## 2018-07-17 NOTE — Addendum Note (Signed)
Addended by: Biagio Borg on: 07/17/2018 03:59 PM   Modules accepted: Orders

## 2018-07-20 ENCOUNTER — Encounter (HOSPITAL_COMMUNITY): Payer: Self-pay | Admitting: Emergency Medicine

## 2018-07-20 ENCOUNTER — Other Ambulatory Visit: Payer: Self-pay

## 2018-07-20 ENCOUNTER — Emergency Department (HOSPITAL_COMMUNITY)
Admission: EM | Admit: 2018-07-20 | Discharge: 2018-07-20 | Disposition: A | Discharge status: Home / Self Care | Payer: 59 | Attending: Emergency Medicine | Admitting: Emergency Medicine

## 2018-07-20 DIAGNOSIS — Z85830 Personal history of malignant neoplasm of bone: Secondary | ICD-10-CM | POA: Insufficient documentation

## 2018-07-20 DIAGNOSIS — Z7982 Long term (current) use of aspirin: Secondary | ICD-10-CM | POA: Insufficient documentation

## 2018-07-20 DIAGNOSIS — R1013 Epigastric pain: Secondary | ICD-10-CM | POA: Diagnosis present

## 2018-07-20 DIAGNOSIS — Z923 Personal history of irradiation: Secondary | ICD-10-CM | POA: Diagnosis not present

## 2018-07-20 DIAGNOSIS — K208 Other esophagitis without bleeding: Secondary | ICD-10-CM

## 2018-07-20 DIAGNOSIS — I10 Essential (primary) hypertension: Secondary | ICD-10-CM | POA: Insufficient documentation

## 2018-07-20 DIAGNOSIS — Z79899 Other long term (current) drug therapy: Secondary | ICD-10-CM | POA: Insufficient documentation

## 2018-07-20 DIAGNOSIS — Z87891 Personal history of nicotine dependence: Secondary | ICD-10-CM | POA: Diagnosis not present

## 2018-07-20 DIAGNOSIS — Z853 Personal history of malignant neoplasm of breast: Secondary | ICD-10-CM | POA: Insufficient documentation

## 2018-07-20 DIAGNOSIS — J45909 Unspecified asthma, uncomplicated: Secondary | ICD-10-CM | POA: Diagnosis not present

## 2018-07-20 LAB — CBC WITH DIFFERENTIAL/PLATELET
Abs Immature Granulocytes: 0.02 10*3/uL (ref 0.00–0.07)
Basophils Absolute: 0.1 10*3/uL (ref 0.0–0.1)
Basophils Relative: 2 %
Eosinophils Absolute: 0 10*3/uL (ref 0.0–0.5)
Eosinophils Relative: 1 %
HCT: 37.8 % (ref 36.0–46.0)
Hemoglobin: 12.6 g/dL (ref 12.0–15.0)
Immature Granulocytes: 1 %
Lymphocytes Relative: 8 %
Lymphs Abs: 0.3 10*3/uL — ABNORMAL LOW (ref 0.7–4.0)
MCH: 36.7 pg — ABNORMAL HIGH (ref 26.0–34.0)
MCHC: 33.3 g/dL (ref 30.0–36.0)
MCV: 110.2 fL — ABNORMAL HIGH (ref 80.0–100.0)
Monocytes Absolute: 0.5 10*3/uL (ref 0.1–1.0)
Monocytes Relative: 13 %
Neutro Abs: 2.6 10*3/uL (ref 1.7–7.7)
Neutrophils Relative %: 75 %
Platelets: 208 10*3/uL (ref 150–400)
RBC: 3.43 MIL/uL — ABNORMAL LOW (ref 3.87–5.11)
RDW: 16.2 % — ABNORMAL HIGH (ref 11.5–15.5)
WBC: 3.4 10*3/uL — ABNORMAL LOW (ref 4.0–10.5)
nRBC: 0 % (ref 0.0–0.2)

## 2018-07-20 LAB — COMPREHENSIVE METABOLIC PANEL
ALT: 21 U/L (ref 0–44)
AST: 29 U/L (ref 15–41)
Albumin: 3.8 g/dL (ref 3.5–5.0)
Alkaline Phosphatase: 69 U/L (ref 38–126)
Anion gap: 6 (ref 5–15)
BUN: 16 mg/dL (ref 8–23)
CO2: 27 mmol/L (ref 22–32)
Calcium: 9.3 mg/dL (ref 8.9–10.3)
Chloride: 107 mmol/L (ref 98–111)
Creatinine, Ser: 1.19 mg/dL — ABNORMAL HIGH (ref 0.44–1.00)
GFR calc Af Amer: 57 mL/min — ABNORMAL LOW (ref >60)
GFR calc non Af Amer: 49 mL/min — ABNORMAL LOW (ref >60)
Glucose, Bld: 100 mg/dL — ABNORMAL HIGH (ref 70–99)
Potassium: 4.1 mmol/L (ref 3.5–5.1)
Sodium: 140 mmol/L (ref 135–145)
Total Bilirubin: 1.7 mg/dL — ABNORMAL HIGH (ref 0.3–1.2)
Total Protein: 7 g/dL (ref 6.5–8.1)

## 2018-07-20 LAB — URINALYSIS, ROUTINE W REFLEX MICROSCOPIC
Bilirubin Urine: NEGATIVE
Glucose, UA: NEGATIVE mg/dL
Hgb urine dipstick: NEGATIVE
Ketones, ur: 20 mg/dL — AB
Leukocytes,Ua: NEGATIVE
Nitrite: NEGATIVE
Protein, ur: NEGATIVE mg/dL
Specific Gravity, Urine: 1.019 (ref 1.005–1.030)
pH: 5 (ref 5.0–8.0)

## 2018-07-20 MED ORDER — SODIUM CHLORIDE 0.9 % IV BOLUS
500.0000 mL | Freq: Once | INTRAVENOUS | Status: AC
  Administered 2018-07-20: 500 mL via INTRAVENOUS

## 2018-07-20 MED ORDER — PANTOPRAZOLE SODIUM 20 MG PO TBEC: 20.0000 mg | DELAYED_RELEASE_TABLET | Freq: Every day | ORAL | 0 refills | Status: DC

## 2018-07-20 MED ORDER — PANTOPRAZOLE SODIUM 40 MG IV SOLR
40.0000 mg | Freq: Once | INTRAVENOUS | Status: AC
  Administered 2018-07-20: 18:00:00 40 mg via INTRAVENOUS
  Filled 2018-07-20: qty 40

## 2018-07-20 MED ORDER — ALUM & MAG HYDROXIDE-SIMETH 200-200-20 MG/5ML PO SUSP
30.0000 mL | Freq: Once | ORAL | Status: AC
  Administered 2018-07-20: 30 mL via ORAL
  Filled 2018-07-20: qty 30

## 2018-07-20 MED ORDER — SUCRALFATE 1 GM/10ML PO SUSP: 1.0000 g | Freq: Three times a day (TID) | ORAL | 0 refills | Status: DC

## 2018-07-20 MED ORDER — ONDANSETRON HCL 8 MG PO TABS: 8.0000 mg | ORAL_TABLET | Freq: Three times a day (TID) | ORAL | 0 refills | Status: DC | PRN

## 2018-07-20 MED ORDER — ONDANSETRON HCL 4 MG/2ML IJ SOLN
4.0000 mg | Freq: Once | INTRAMUSCULAR | Status: AC
  Administered 2018-07-20: 4 mg via INTRAVENOUS
  Filled 2018-07-20: qty 2

## 2018-07-20 NOTE — ED Provider Notes (Addendum)
[facility] DEPT Provider Note   CSN: [id number] Arrival date & time: 07/20/18  1453    History   Chief Complaint Chief Complaint  Patient presents with  . Emesis  . chest pain    HPI Caitlin Wall is a 63 y.o. female.     HPI   She complains of burning epigastric pain radiating to her neck, for about a week.  This prevents her from being able to eat.  She denies nausea, vomiting, fever, chills, weakness or dizziness.  She recently completed a course of radiation, for rib and spine metastases secondary to breast cancer.  She has tried taking antacids but is unable to swallow them.  She does not have a pre-existing there are no other known modifying factors.  Past Medical History:  Diagnosis Date  . Asthma, persistent not controlled 07/27/2012  . Breast cancer ([place]) 12/28/14   Left BresastDCIS  . COLONIC POLYPS, HX OF 11/04/2007  . COPD (chronic obstructive pulmonary disease) ([place])   . GERD 11/04/2007  . GERD (gastroesophageal reflux disease)   . HAIR LOSS 11/04/2007  . Hx of cardiac cath 11/15/2016   a. LHC 11/15/16 showed normal coronaries and EF 50-55% with normal EDP. (done for false positive NST)  . Hx of colonic polyps 1995   adenomatous  . Hyperlipidemia   . HYPERLIPIDEMIA 11/04/2007  . Hypertension   . HYPERTENSION 06/07/2009  . LBBB (left bundle branch block) 07/26/2011  . Personal history of radiation therapy   . S/P radiation therapy 02/02/15-03/24/15   left breast 60.4GY    Patient Active Problem List   Diagnosis Date Noted  . Bone metastases ([place]) 02/24/2018  . Pain from bone metastases ([place]) 02/24/2018  . Sternal fracture 01/09/2018  . Hematochezia 01/09/2018  . Gastritis 01/09/2018  . Cough 05/31/2017  . Colitis 05/31/2017  . S/P cardiac cath 12/04/2016  . Palpitations 10/23/2016  . Chest pain 10/23/2016  . Left knee pain 04/16/2016  . Left leg swelling 04/16/2016  . Hemoptysis 04/16/2016  . Right-sided chest pain  04/16/2016  . Acute deep vein thrombosis (DVT) of distal vein of left lower extremity ([place]) 04/16/2016  . Malignant neoplasm of lower-outer quadrant of left breast of female, estrogen receptor positive ([place]) 01/16/2015  . Back pain, thoracic 02/22/2013  . Lung nodules 10/14/2012  . Seasonal and perennial allergic rhinitis 07/27/2012  . Asthma, mild intermittent, well-controlled 07/27/2012  . LBBB (left bundle branch block) 07/26/2011  . Encounter for well adult exam with abnormal findings 05/18/2010  . Essential hypertension 06/07/2009  . Hyperlipidemia 11/04/2007  . GERD 11/04/2007  . COLONIC POLYPS, HX OF 11/04/2007    Past Surgical History:  Procedure Laterality Date  . ABDOMINAL HYSTERECTOMY    . ANTERIOR FUSION CERVICAL SPINE    . AUGMENTATION MAMMAPLASTY Bilateral   . bilat ear surgury/hearing loss    . BREAST LUMPECTOMY Left 2016  . BREAST LUMPECTOMY WITH RADIOACTIVE SEED LOCALIZATION Left 12/28/2014   Procedure: BREAST LUMPECTOMY WITH RADIOACTIVE SEED LOCALIZATION;  Surgeon: Autumn Messing III, MD;  Location: [facility];  Service: General;  Laterality: Left;  . COLONOSCOPY    . LEFT HEART CATH AND CORONARY ANGIOGRAPHY N/A 11/15/2016   Procedure: LEFT HEART CATH AND CORONARY ANGIOGRAPHY;  Surgeon: Belva Crome, MD;  Location: [facility];  Service: Cardiovascular;  Laterality: N/A;  . POLYPECTOMY       OB History   No obstetric history on file.      Home Medications  Prior to Admission medications   Medication Sig Start Date End Date Taking? Authorizing Provider  amLODipine (NORVASC) 2.5 MG tablet Take 1 tablet (2.5 mg total) by mouth daily. 06/29/18 06/29/19 Yes Biagio Borg, MD  aspirin EC 81 MG tablet Take 81 mg by mouth daily.   Yes [provider]  cholecalciferol (VITAMIN D3) 25 MCG (1000 UT) tablet Take 1 tablet (1,000 Units total) by mouth daily. 04/28/18  Yes Magrinat, Virgie Dad, MD  GERI-MOX 200-200-20 MG/5ML suspension Take 30 mLs by  mouth as needed for heartburn.  07/03/18  Yes [provider]  HYDROcodone-acetaminophen (NORCO) 7.5-325 MG tablet Take 1 tablet by mouth at bedtime as needed for moderate pain. 06/29/18  Yes Biagio Borg, MD  Ipratropium-Albuterol (COMBIVENT) 20-100 MCG/ACT AERS respimat Inhale 1 puff into the lungs every 6 (six) hours. 05/29/18  Yes Causey, Charlestine Massed, NP  Liniments (BLUE-EMU SUPER STRENGTH EX) Apply 1 application topically daily as needed (pain).   Yes [provider]  losartan (COZAAR) 100 MG tablet Take 1 tablet (100 mg total) by mouth daily. 10/24/17  Yes Biagio Borg, MD  palbociclib [facility]) 125 MG capsule Take 1 capsule (125 mg total) by mouth daily. Take after lunch. Take for 21 days on, 7 days off, repeat every 28 days. 03/10/18  Yes Magrinat, Virgie Dad, MD  Spacer/Aero-Holding Josiah Lobo DEVI Spacer to be used with inhaler 05/29/18  Yes Causey, Charlestine Massed, NP  sucralfate (CARAFATE) 1 g tablet Take 1 tablet (1 g total) by mouth 4 (four) times daily. 07/10/18  Yes Kyung Rudd, MD  valACYclovir (VALTREX) 500 MG tablet Take 1 tablet (500 mg total) by mouth 2 (two) times daily. 07/06/18  Yes Magrinat, Virgie Dad, MD  HYDROcodone-acetaminophen (NORCO) 7.5-325 MG tablet Take 1 tablet by mouth at bedtime. Patient not taking: Reported on 07/20/2018 07/17/18   Biagio Borg, MD  ondansetron [facility]) 8 MG tablet Take 1 tablet (8 mg total) by mouth every 8 (eight) hours as needed for nausea or vomiting. 07/20/18   Daleen Bo, MD  pantoprazole (PROTONIX) 20 MG tablet Take 1 tablet (20 mg total) by mouth daily. 07/20/18   Daleen Bo, MD  sucralfate (CARAFATE) 1 GM/10ML suspension Take 10 mLs (1 g total) by mouth 4 (four) times daily -  with meals and at bedtime. 07/20/18   Daleen Bo, MD    Family History Family History  Problem Relation Age of Onset  . Breast cancer Other   . Cancer Paternal Grandmother        breast  . Lung cancer Father   . Breast cancer Maternal  Grandmother   . Healthy Mother   . Coronary artery disease Neg Hx   . Colon cancer Neg Hx   . Pancreatic cancer Neg Hx   . Rectal cancer Neg Hx   . Stomach cancer Neg Hx   . Esophageal cancer Neg Hx     Social History Social History   Tobacco Use  . Smoking status: Former Smoker    Packs/day: 1.00    Years: 20.00    Pack years: 20.00    Types: Cigarettes    Last attempt to quit: 08/19/1989    Years since quitting: 28.9  . Smokeless tobacco: Never Used  Substance Use Topics  . Alcohol use: No  . Drug use: No     Allergies   Codeine and Tramadol hcl   Review of Systems Review of Systems  All other systems reviewed and are negative.  Physical Exam Updated Vital Signs BP 124/69   Pulse 87   Temp 98.2 F (36.8 C) (Oral)   Resp 19   SpO2 96%   Physical Exam Vitals signs and nursing note reviewed.  Constitutional:      Appearance: She is well-developed.  HENT:     Head: Normocephalic and atraumatic.     Right Ear: External ear normal.     Left Ear: External ear normal.  Eyes:     Conjunctiva/sclera: Conjunctivae normal.     Pupils: Pupils are equal, round, and reactive to light.  Neck:     Musculoskeletal: Normal range of motion and neck supple.     Trachea: Phonation normal.  Cardiovascular:     Rate and Rhythm: Normal rate and regular rhythm.     Heart sounds: Normal heart sounds.  Pulmonary:     Effort: Pulmonary effort is normal.     Breath sounds: Normal breath sounds.  Abdominal:     Palpations: Abdomen is soft.     Tenderness: There is abdominal tenderness (epigastric, mild).  Musculoskeletal: Normal range of motion.  Skin:    General: Skin is warm and dry.  Neurological:     Mental Status: She is alert and oriented to person, place, and time.     Cranial Nerves: No cranial nerve deficit.     Sensory: No sensory deficit.     Motor: No abnormal muscle tone.     Coordination: Coordination normal.  Psychiatric:        Mood and Affect: Mood  normal.        Behavior: Behavior normal.        Thought Content: Thought content normal.        Judgment: Judgment normal.      ED Treatments / Results  Labs (all labs ordered are listed, but only abnormal results are displayed) Labs Reviewed  COMPREHENSIVE METABOLIC PANEL - Abnormal; Notable for the following components:      Result Value   Glucose, Bld 100 (*)    Creatinine, Ser 1.19 (*)    Total Bilirubin 1.7 (*)    GFR calc non Af Amer 49 (*)    GFR calc Af Amer 57 (*)    All other components within normal limits  CBC WITH DIFFERENTIAL/PLATELET - Abnormal; Notable for the following components:   WBC 3.4 (*)    RBC 3.43 (*)    MCV 110.2 (*)    MCH 36.7 (*)    RDW 16.2 (*)    Lymphs Abs 0.3 (*)    All other components within normal limits  URINALYSIS, ROUTINE W REFLEX MICROSCOPIC - Abnormal; Notable for the following components:   Ketones, ur 20 (*)    All other components within normal limits    EKG EKG Interpretation  Date/Time:  Monday Jul 20 2018 15:06:34 EDT Ventricular Rate:  93 PR Interval:    QRS Duration: 140 QT Interval:  395 QTC Calculation: 492 R Axis:   44 Text Interpretation:  Sinus rhythm Left bundle branch block since last tracing no significant change Confirmed by Daleen Bo [phone]) on 07/20/2018 8:20:02 PM   Radiology No results found.  Procedures Procedures (including critical care time)  Medications Ordered in ED Medications  alum & mag hydroxide-simeth (MAALOX/MYLANTA) 200-200-20 MG/5ML suspension 30 mL (has no administration in time range)  sodium chloride 0.9 % bolus 500 mL (0 mLs Intravenous Stopped 07/20/18 1827)  pantoprazole (PROTONIX) injection 40 mg (40 mg Intravenous Given 07/20/18 1824)  ondansetron (  ZOFRAN) injection 4 mg (4 mg Intravenous Given 07/20/18 1823)  alum & mag hydroxide-simeth (MAALOX/MYLANTA) 200-200-20 MG/5ML suspension 30 mL (30 mLs Oral Given 07/20/18 2049)     Initial Impression / Assessment and Plan / ED  Course  I have reviewed the triage vital signs and the nursing notes.  Pertinent labs & imaging results that were available during my care of the patient were reviewed by me and considered in my medical decision making (see chart for details).         Patient Vitals for the past 24 hrs:  BP Temp Temp src Pulse Resp SpO2  07/20/18 2200 124/69 - - 87 19 96 %  07/20/18 2100 133/70 - - 97 (!) 26 97 %  07/20/18 2030 127/75 - - 92 (!) 25 97 %  07/20/18 2000 125/69 - - 87 13 98 %  07/20/18 1930 125/74 - - 87 (!) 28 99 %  07/20/18 1900 119/65 - - 91 19 97 %  07/20/18 1833 136/80 - - - 18 -  07/20/18 1730 119/74 - - 86 19 99 %  07/20/18 1505 (!) 113/52 98.2 F (36.8 C) Oral 94 18 99 %    10:15 PM Reevaluation with update and discussion. After initial assessment and treatment, an updated evaluation reveals she is more comfortable now and has been tolerating some oral fluids.  She feels comfortable enough to go home at this time.  Findings discussed and questions answered. Daleen Bo   Medical Decision Making: I was consistent with radiation esophagitis.  Doubt metabolic instability or impending vascular collapse patient improved with treatment given and is stable for discharge.  CRITICAL CARE- No Performed by: Daleen Bo  Nursing Notes Reviewed/ Care Coordinated Applicable Imaging Reviewed Interpretation of Laboratory Data incorporated into ED treatment  The patient appears reasonably screened and/or stabilized for discharge and I doubt any other medical condition or other [facility] requiring further screening, evaluation, or treatment in the ED at this time prior to discharge.  Plan: Home Medications-continue usual medications; Home Treatments-rest, fluids; return here if the recommended treatment, does not improve the symptoms; Recommended follow up-PCP, prn   Final Clinical Impressions(s) / ED Diagnoses   Final diagnoses:  Radiation-induced esophagitis    ED Discharge Orders          Ordered    sucralfate (CARAFATE) 1 GM/10ML suspension  3 times daily with meals & bedtime     07/20/18 2215    pantoprazole (PROTONIX) 20 MG tablet  Daily     07/20/18 2215    ondansetron (ZOFRAN) 8 MG tablet  Every 8 hours PRN     07/20/18 2215           Daleen Bo, MD 07/20/18 2216    Daleen Bo, MD 07/27/18 1752

## 2018-07-20 NOTE — ED Notes (Signed)
Assisted patient to restroom and back to stretcher. Noticed patient was short of breath when returning back to stretcher.

## 2018-07-20 NOTE — ED Triage Notes (Signed)
Pt c/o mid abd burning since before last Tuesday when had last cancer tratment. Reports MD gave her medications on Tuesday to help but n/v cant kep medications down.

## 2018-07-21 ENCOUNTER — Inpatient Hospital Stay (HOSPITAL_BASED_OUTPATIENT_CLINIC_OR_DEPARTMENT_OTHER): Payer: 59 | Admitting: Adult Health

## 2018-07-21 ENCOUNTER — Inpatient Hospital Stay: Payer: 59 | Attending: Oncology

## 2018-07-21 ENCOUNTER — Other Ambulatory Visit: Payer: Self-pay

## 2018-07-21 ENCOUNTER — Inpatient Hospital Stay: Payer: 59

## 2018-07-21 ENCOUNTER — Telehealth: Payer: Self-pay | Admitting: *Deleted

## 2018-07-21 ENCOUNTER — Encounter: Payer: Self-pay | Admitting: Adult Health

## 2018-07-21 VITALS — BP 92/65 | HR 83 | Temp 97.2°F | Resp 18

## 2018-07-21 VITALS — BP 112/56 | HR 88 | Temp 97.2°F | Resp 17 | Ht 65.0 in | Wt 178.0 lb

## 2018-07-21 DIAGNOSIS — C7951 Secondary malignant neoplasm of bone: Secondary | ICD-10-CM | POA: Insufficient documentation

## 2018-07-21 DIAGNOSIS — R634 Abnormal weight loss: Secondary | ICD-10-CM

## 2018-07-21 DIAGNOSIS — R110 Nausea: Secondary | ICD-10-CM | POA: Diagnosis not present

## 2018-07-21 DIAGNOSIS — Z5111 Encounter for antineoplastic chemotherapy: Secondary | ICD-10-CM | POA: Diagnosis present

## 2018-07-21 DIAGNOSIS — I824Z2 Acute embolism and thrombosis of unspecified deep veins of left distal lower extremity: Secondary | ICD-10-CM

## 2018-07-21 DIAGNOSIS — C50512 Malignant neoplasm of lower-outer quadrant of left female breast: Secondary | ICD-10-CM

## 2018-07-21 DIAGNOSIS — K208 Other esophagitis without bleeding: Secondary | ICD-10-CM

## 2018-07-21 DIAGNOSIS — Z170 Estrogen receptor positive status [ER+]: Secondary | ICD-10-CM

## 2018-07-21 DIAGNOSIS — R5383 Other fatigue: Secondary | ICD-10-CM

## 2018-07-21 DIAGNOSIS — Z87891 Personal history of nicotine dependence: Secondary | ICD-10-CM

## 2018-07-21 LAB — CBC WITH DIFFERENTIAL/PLATELET
Abs Immature Granulocytes: 0.02 10*3/uL (ref 0.00–0.07)
Basophils Absolute: 0.1 10*3/uL (ref 0.0–0.1)
Basophils Relative: 2 %
Eosinophils Absolute: 0.1 10*3/uL (ref 0.0–0.5)
Eosinophils Relative: 3 %
HCT: 36.7 % (ref 36.0–46.0)
Hemoglobin: 12.6 g/dL (ref 12.0–15.0)
Immature Granulocytes: 1 %
Lymphocytes Relative: 13 %
Lymphs Abs: 0.4 10*3/uL — ABNORMAL LOW (ref 0.7–4.0)
MCH: 37.3 pg — ABNORMAL HIGH (ref 26.0–34.0)
MCHC: 34.3 g/dL (ref 30.0–36.0)
MCV: 108.6 fL — ABNORMAL HIGH (ref 80.0–100.0)
Monocytes Absolute: 0.4 10*3/uL (ref 0.1–1.0)
Monocytes Relative: 11 %
Neutro Abs: 2.2 10*3/uL (ref 1.7–7.7)
Neutrophils Relative %: 70 %
Platelets: 204 10*3/uL (ref 150–400)
RBC: 3.38 MIL/uL — ABNORMAL LOW (ref 3.87–5.11)
RDW: 16 % — ABNORMAL HIGH (ref 11.5–15.5)
WBC: 3.1 10*3/uL — ABNORMAL LOW (ref 4.0–10.5)
nRBC: 0 % (ref 0.0–0.2)

## 2018-07-21 LAB — COMPREHENSIVE METABOLIC PANEL
ALT: 21 U/L (ref 0–44)
AST: 25 U/L (ref 15–41)
Albumin: 3.5 g/dL (ref 3.5–5.0)
Alkaline Phosphatase: 72 U/L (ref 38–126)
Anion gap: 9 (ref 5–15)
BUN: 17 mg/dL (ref 8–23)
CO2: 26 mmol/L (ref 22–32)
Calcium: 9.5 mg/dL (ref 8.9–10.3)
Chloride: 107 mmol/L (ref 98–111)
Creatinine, Ser: 1.27 mg/dL — ABNORMAL HIGH (ref 0.44–1.00)
GFR calc Af Amer: 53 mL/min — ABNORMAL LOW (ref >60)
GFR calc non Af Amer: 46 mL/min — ABNORMAL LOW (ref >60)
Glucose, Bld: 104 mg/dL — ABNORMAL HIGH (ref 70–99)
Potassium: 4 mmol/L (ref 3.5–5.1)
Sodium: 142 mmol/L (ref 135–145)
Total Bilirubin: 1.7 mg/dL — ABNORMAL HIGH (ref 0.3–1.2)
Total Protein: 7.1 g/dL (ref 6.5–8.1)

## 2018-07-21 MED ORDER — MORPHINE SULFATE 10 MG/5ML PO SOLN: 2.5000 mg | Freq: Four times a day (QID) | ORAL | 0 refills | Status: DC | PRN

## 2018-07-21 MED ORDER — FAMOTIDINE IN NACL 20-0.9 MG/50ML-% IV SOLN
INTRAVENOUS | Status: AC
  Filled 2018-07-21: qty 50

## 2018-07-21 MED ORDER — LIDOCAINE VISCOUS HCL 2 % MT SOLN
15.0000 mL | Freq: Once | OROMUCOSAL | Status: AC
  Administered 2018-07-21: 15 mL via ORAL
  Filled 2018-07-21: qty 15

## 2018-07-21 MED ORDER — MORPHINE SULFATE (PF) 4 MG/ML IV SOLN
INTRAVENOUS | Status: AC
  Filled 2018-07-21: qty 1

## 2018-07-21 MED ORDER — PROMETHAZINE HCL 25 MG RE SUPP: 25.0000 mg | Freq: Four times a day (QID) | RECTAL | 0 refills | Status: DC | PRN

## 2018-07-21 MED ORDER — ALUM & MAG HYDROXIDE-SIMETH 200-200-20 MG/5ML PO SUSP
ORAL | Status: AC
  Filled 2018-07-21: qty 30

## 2018-07-21 MED ORDER — ALUM & MAG HYDROXIDE-SIMETH 200-200-20 MG/5ML PO SUSP
30.0000 mL | Freq: Once | ORAL | Status: AC
  Administered 2018-07-21: 15:00:00 30 mL via ORAL

## 2018-07-21 MED ORDER — FAMOTIDINE IN NACL 20-0.9 MG/50ML-% IV SOLN
20.0000 mg | Freq: Once | INTRAVENOUS | Status: AC
  Administered 2018-07-21: 13:00:00 20 mg via INTRAVENOUS

## 2018-07-21 MED ORDER — DICYCLOMINE HCL 10 MG/5ML PO SOLN: 10.0000 mg | Freq: Three times a day (TID) | ORAL | 0 refills | Status: DC

## 2018-07-21 MED ORDER — DENOSUMAB 120 MG/1.7ML ~~LOC~~ SOLN
120.0000 mg | Freq: Once | SUBCUTANEOUS | Status: AC
  Administered 2018-07-21: 120 mg via SUBCUTANEOUS

## 2018-07-21 MED ORDER — MORPHINE SULFATE 4 MG/ML IJ SOLN
1.0000 mg | Freq: Once | INTRAMUSCULAR | Status: AC
  Administered 2018-07-21: 13:00:00 1 mg via INTRAVENOUS
  Filled 2018-07-21: qty 1

## 2018-07-21 MED ORDER — FULVESTRANT 250 MG/5ML IM SOLN
500.0000 mg | Freq: Once | INTRAMUSCULAR | Status: AC
  Administered 2018-07-21: 11:00:00 500 mg via INTRAMUSCULAR

## 2018-07-21 MED ORDER — FULVESTRANT 250 MG/5ML IM SOLN
INTRAMUSCULAR | Status: AC
  Filled 2018-07-21: qty 5

## 2018-07-21 MED ORDER — ONDANSETRON HCL 4 MG/2ML IJ SOLN
INTRAMUSCULAR | Status: AC
  Filled 2018-07-21: qty 4

## 2018-07-21 MED ORDER — DENOSUMAB 120 MG/1.7ML ~~LOC~~ SOLN
SUBCUTANEOUS | Status: AC
  Filled 2018-07-21: qty 1.7

## 2018-07-21 NOTE — Patient Instructions (Addendum)
Stop taking the Ibrance/Palbociclib until further notice.    Dysphagia  Dysphagia is trouble swallowing. This condition occurs when solids and liquids stick in a person's throat on the way down to the stomach, or when food takes longer to get to the stomach. You may have problems swallowing food, liquids, or both. You may also have pain while trying to swallow. It may take you more time and effort to swallow something. What are the causes? This condition is caused by:  Problems with the muscles. They may make it difficult for you to move food and liquids through the tube that connects your mouth to your stomach (esophagus). You may have ulcers, scar tissue, or inflammation that blocks the normal passage of food and liquids. Causes of these problems include: ? Acid reflux from your stomach into your esophagus (gastroesophageal reflux). ? Infections. ? Radiation treatment for cancer. ? Medicines taken without enough fluids to wash them down into your stomach.  Nerve problems. These prevent signals from being sent to the muscles of your esophagus to squeeze (contract) and move what you swallow down to your stomach.  Globus pharyngeus. This is a common problem that involves feeling like something is stuck in the throat or a sense of trouble with swallowing even though nothing is wrong with the swallowing passages.  Stroke. This can affect the nerves and make it difficult to swallow.  Certain conditions, such as cerebral palsy or Parkinson disease. What are the signs or symptoms? Common symptoms of this condition include:  A feeling that solids or liquids are stuck in your throat on the way down to the stomach.  Food taking too long to get to the stomach. Other symptoms include:  Food moving back from your stomach to your mouth (regurgitation).  Noises coming from your throat.  Chest discomfort with swallowing.  A feeling of fullness when swallowing.  Drooling, especially when the  throat is blocked.  Pain while swallowing.  Heartburn.  Coughing or gagging while trying to swallow. How is this diagnosed? This condition is diagnosed by:  Barium X-ray. In this test, you swallow a white substance (contrast medium)that sticks to the inside of your esophagus. X-ray images are then taken.  Endoscopy. In this test, a flexible telescope is inserted down your throat to look at your esophagus and your stomach.  CT scans and MRI. How is this treated? Treatment for dysphagia depends on the cause of the condition:  If the dysphagia is caused by acid reflux or infection, medicines may be used. They may include antibiotics and heartburn medicines.  If the dysphagia is caused by problems with your muscles, swallowing therapy may be used to help you strengthen your swallowing muscles. You may have to do specific exercises to strengthen the muscles or stretch them.  If the dysphagia is caused by a blockage or mass, procedures to remove the blockage may be done. You may need surgery and a feeding tube. You may need to make diet changes. Ask your health care provider for specific instructions. Follow these instructions at home: Eating and drinking  Try to eat soft food that is easier to swallow.  Follow any diet changes as told by your health care provider.  Cut your food into small pieces and eat slowly.  Eat and drink only when you are sitting upright.  Do not drink alcohol or caffeine. If you need help quitting, ask your health care provider. General instructions  Check your weight every day to make sure you are  not losing weight.  Take over-the-counter and prescription medicines only as told by your health care provider.  If you were prescribed an antibiotic medicine, take it as told by your health care provider. Do not stop taking the antibiotic even if you start to feel better.  Do not use any products that contain nicotine or tobacco, such as cigarettes and  e-cigarettes. If you need help quitting, ask your health care provider.  Keep all follow-up visits as told by your health care provider. This is important. Contact a health care provider if:  You lose weight because you cannot swallow.  You cough when you drink liquids (aspiration).  You cough up partially digested food. Get help right away if:  You cannot swallow your saliva.  You have shortness of breath or a fever, or both.  You have a hoarse voice and also have trouble swallowing. Summary  Dysphagia is trouble swallowing. This condition occurs when solids and liquids stick in a person's throat on the way down to the stomach, or when food takes longer to get to the stomach.  Dysphagia has many possible causes and symptoms.  Treatment for dysphagia depends on the cause of the condition. This information is not intended to replace advice given to you by your health care provider. Make sure you discuss any questions you have with your health care provider. Document Released: 02/09/2000 Document Revised: 02/01/2016 Document Reviewed: 02/01/2016 Elsevier Interactive Patient Education  2019 [employer].

## 2018-07-21 NOTE — Telephone Encounter (Signed)
This RN received VM from pt requesting a return call- no number given.  Call returned to home number- VM not set up- could not leave message.  Called to work number and informed pt not currently in- message left on VM.  During this typing- call received from injection- stating they had patient- and she states she has been unable to eat since starting radiation and was hoping to see MD or NP.  Appointment made with [facility]/NP per review.

## 2018-07-21 NOTE — Progress Notes (Addendum)
[facility]  [phone] [phone]) [phone]    ID: Caitlin Wall DOB: January 01, 1956  MR#: [id number]  QHU#:[id number]  Patient Care Team: Biagio Borg, MD as PCP - General Tamala Julian Lynnell Dike, MD as PCP - Cardiology (Cardiology) Magrinat, Virgie Dad, MD as Consulting Physician (Oncology) Kyung Rudd, MD as Consulting Physician (Radiation Oncology) Jovita Kussmaul, MD as Consulting Physician (General Surgery) Sylvan Cheese, NP as Nurse Practitioner (Hematology and Oncology) OTHER MD: Keturah Barre MD   CHIEF COMPLAINT: Metastatic estrogen receptor positive breast cancer  CURRENT TREATMENT: Denosumab/Xgeva, fluvestrant, palbociclib   BREAST CANCER HISTORY:  From the original intake note:   Caitlin Wall [facility]. She has subpectoral saline implants in place. There was a possible asymmetry in the left breast and she was recalled for left diagnostic mammography with tomosynthesis and left breast ultrasonography 11/03/2014. The breast density was category C. In the left breast lower outer quadrant there was an irregular mass which was not palpable and which by ultrasonography measured 0.6 cm. The left axilla was sonographically benign. Biopsy of this mass 11/09/2014 showed ([id number]) fibroadipose adipose tissue. This was felt to be discordant.  Accordingly the patient was referred to surgery and she underwent left breast radioactive seed localized lumpectomy 12/28/2014. The pathology from that procedure (([id number]) showed ductal carcinoma in situ, high-grade, measuring 0.2 cm. This was less than 0.1 cm from the posterior margin. The cells was estrogen receptor positive at 95%, with strong staining intensity, progesterone receptor positive at 30% with moderate staining intensity.  Her subsequent history is as detailed below.   INTERVAL HISTORY: Caitlin Wall is seen today for an urgent evaluation of  difficulty swallowing. From 07/01/2018 through 07/14/2018 she underwent radiation to bone metastases at T7/8 spine, Left 8th rib, and Right 8th rib.  Since then she has developed dysphagia and odynophagia.  This was so severe that yesterday she was seen in the ER yesterday for burning epigastric pain radiating to her neck x 1 week preventing her ability to eat.  She was given a GI cocktail that didn't help, however nausea medication given in the ER that helped.  She also received IV fluids.  She was diagnosed with radiation esophagitis and was prescribed Carafate, Protonix, magic mouthwash, and Zofran.  She has not yet picked up any of these medications.  She has lost 20 pounds in the past 2 months.    She continues on denosumab/Xgeva, with her most recent dose today. She tolerates this well and without any noticeable side effects.    She also continues on fulvestrant, with her most recent dose received on today. She tolerates this well and without any noticeable side effects.     She is prescribed palbociclib/Ibrance '125mg'$  daily 3 weeks on and 1 week off. She notes that she has about 10 days left, but hasn't been able to take the Palbociclib over the past two days due to nausea/vomiting and inability to keep it down.     REVIEW OF SYSTEMS: Caitlin Wall's last bowel movement was yesterday.  She is tearful and in pain due to the above.  She has had limited ability to do things because of fatigue.  She denies any new pain elsewhere.  She is without fever, chills, cough, shortness of breath, palpitations.  She is tolerating the Xgeva and Fulvestrant well.  Otherwise, detailed ROS was neg.     PAST MEDICAL HISTORY: Past Medical History:  Diagnosis Date  Asthma, persistent not controlled 07/27/2012   Breast cancer ([facility]) 12/28/14   Left [facility]IS   COLONIC POLYPS, HX OF 11/04/2007   COPD (chronic obstructive pulmonary disease) (HCC)    GERD 11/04/2007   GERD (gastroesophageal reflux disease)    HAIR LOSS  11/04/2007   Hx of cardiac cath 11/15/2016   a. LHC 11/15/16 showed normal coronaries and EF 50-55% with normal EDP. (done for false positive NST)   Hx of colonic polyps 1995   adenomatous   Hyperlipidemia    HYPERLIPIDEMIA 11/04/2007   Hypertension    HYPERTENSION 06/07/2009   LBBB (left bundle branch block) 07/26/2011   Personal history of radiation therapy    S/P radiation therapy 02/02/15-03/24/15   left breast 60.4GY    PAST SURGICAL HISTORY: Past Surgical History:  Procedure Laterality Date   ABDOMINAL HYSTERECTOMY     ANTERIOR FUSION CERVICAL SPINE     AUGMENTATION MAMMAPLASTY Bilateral    bilat ear surgury/hearing loss     BREAST LUMPECTOMY Left 2016   BREAST LUMPECTOMY WITH RADIOACTIVE SEED LOCALIZATION Left 12/28/2014   Procedure: BREAST LUMPECTOMY WITH RADIOACTIVE SEED LOCALIZATION;  Surgeon: Autumn Messing III, MD;  Location: [facility];  Service: General;  Laterality: Left;   COLONOSCOPY     LEFT HEART CATH AND CORONARY ANGIOGRAPHY N/A 11/15/2016   Procedure: LEFT HEART CATH AND CORONARY ANGIOGRAPHY;  Surgeon: Belva Crome, MD;  Location: [facility];  Service: Cardiovascular;  Laterality: N/A;   POLYPECTOMY      FAMILY HISTORY: Family History  Problem Relation Age of Onset   Breast cancer Other    Cancer Paternal Grandmother        breast   Lung cancer Father    Breast cancer Maternal Grandmother    Healthy Mother    Coronary artery disease Neg Hx    Colon cancer Neg Hx    Pancreatic cancer Neg Hx    Rectal cancer Neg Hx    Stomach cancer Neg Hx    Esophageal cancer Neg Hx    [name] died from lung cancer in the setting of tobacco abuse at age 80. [name] died from "old age" at age 43. The patient had one brother, 3 sisters. The brother had" I cancer" requiring enucleation--the patient does not know whether this was a primary ocular melanoma. [name] was diagnosed  with breast cancer but the patient does not know at what age. There is no other history of breast or ovarian cancer in the family   GYNECOLOGIC HISTORY:  No LMP recorded. Patient has had a hysterectomy. Menarche age 48, first live birth age 25. The patient is GX P3. She is status post hysterectomy without salpingo-oophorectomy. She did not take hormone replacement. She did use oral contraceptives remotely without any complications.   SOCIAL HISTORY:  Caitlin Wall works in accounts payable. Her husband [name] is disabled because of back problems. [name] is a Marine scientist working in rehabilitation in [place]. [name] lives in [place] where he works as a Games developer. [name] lives in [place] where he works as a Emergency planning/management officer. The patient has 4 grandchildren. She is not a Ambulance person.    ADVANCED DIRECTIVES: Not in place   HEALTH MAINTENANCE: Social History   Tobacco Use   Smoking status: Former Smoker    Packs/day: 1.00    Years: 20.00    Pack years: 20.00    Types: Cigarettes    Last attempt to quit:  08/19/1989    Years since quitting: 28.9   Smokeless tobacco: Never Used  Substance Use Topics   Alcohol use: No   Drug use: No    Colonoscopy:  PAP:  Bone density:  Lipid panel:  Allergies  Allergen Reactions   Codeine     REACTION: nausea,   Tramadol Hcl Nausea Only    Current Outpatient Medications  Medication Sig Dispense Refill   amLODipine (NORVASC) 2.5 MG tablet Take 1 tablet (2.5 mg total) by mouth daily. 90 tablet 3   aspirin EC 81 MG tablet Take 81 mg by mouth daily.     cholecalciferol (VITAMIN D3) 25 MCG (1000 UT) tablet Take 1 tablet (1,000 Units total) by mouth daily.     GERI-MOX 200-200-20 MG/5ML suspension Take 30 mLs by mouth as needed for heartburn.      HYDROcodone-acetaminophen (NORCO) 7.5-325 MG tablet Take 1 tablet by mouth at bedtime. 30 tablet 0   Ipratropium-Albuterol (COMBIVENT) 20-100 MCG/ACT AERS respimat  Inhale 1 puff into the lungs every 6 (six) hours. 1 Inhaler 0   Liniments (BLUE-EMU SUPER STRENGTH EX) Apply 1 application topically daily as needed (pain).     losartan (COZAAR) 100 MG tablet Take 1 tablet (100 mg total) by mouth daily. 90 tablet 3   ondansetron (ZOFRAN) 8 MG tablet Take 1 tablet (8 mg total) by mouth every 8 (eight) hours as needed for nausea or vomiting. 20 tablet 0   palbociclib (IBRANCE) 125 MG capsule Take 1 capsule (125 mg total) by mouth daily. Take after lunch. Take for 21 days on, 7 days off, repeat every 28 days. 21 capsule 6   pantoprazole (PROTONIX) 20 MG tablet Take 1 tablet (20 mg total) by mouth daily. 30 tablet 0   Spacer/Aero-Holding Chambers DEVI Spacer to be used with inhaler 1 each 0   sucralfate (CARAFATE) 1 g tablet Take 1 tablet (1 g total) by mouth 4 (four) times daily. 120 tablet 1   valACYclovir (VALTREX) 500 MG tablet Take 1 tablet (500 mg total) by mouth 2 (two) times daily. 60 tablet 1   morphine 10 MG/5ML solution Take 1.3-2.5 mLs (2.6-5 mg total) by mouth every 6 (six) hours as needed for severe pain. 30 mL 0   promethazine (PHENERGAN) 25 MG suppository Place 1 suppository (25 mg total) rectally every 6 (six) hours as needed for nausea or vomiting. 12 each 0   No current facility-administered medications for this visit.     OBJECTIVE: Middle-aged white woman who appears stated age  46:   07/21/18 1154  BP: (!) 112/56  Pulse: 88  Resp: 17  Temp: (!) 97.2 F (36.2 C)  SpO2: 100%     Body mass index is 29.62 kg/m.    ECOG FS:1 - Symptomatic but completely ambulatory GENERAL: Patient is non toxic, but in obvious pain and tearful HEENT:  Sclerae anicteric.  Oropharynx clear and moist. No ulcerations or evidence of oropharyngeal candidiasis. Neck is supple.  NODES:  No cervical, supraclavicular lymphadenopathy palpated.  BREAST EXAM:  Deferred. LUNGS:  Clear to auscultation bilaterally.  No wheezes or rhonchi. HEART:  Regular  rate and rhythm. No murmur appreciated. ABDOMEN:  Soft, nontender.  Positive, normoactive bowel sounds. No organomegaly palpated. MSK:  No focal spinal tenderness to palpation.  EXTREMITIES:  No peripheral edema.   SKIN:  Clear with no obvious rashes or skin changes. No nail dyscrasia. NEURO:  Nonfocal. Well oriented.  anxious affect.     LAB RESULTS:  CMP  Component Value Date/Time   NA 142 07/21/2018 0951   NA 141 04/25/2017 1213   NA 145 07/04/2016 1133   K 4.0 07/21/2018 0951   K 4.2 07/04/2016 1133   CL 107 07/21/2018 0951   CO2 26 07/21/2018 0951   CO2 28 07/04/2016 1133   GLUCOSE 104 (H) 07/21/2018 0951   GLUCOSE 101 07/04/2016 1133   BUN 17 07/21/2018 0951   BUN 18 04/25/2017 1213   BUN 16.0 07/04/2016 1133   CREATININE 1.27 (H) 07/21/2018 0951   CREATININE 1.53 (H) 04/28/2017 1442   CREATININE 0.9 07/04/2016 1133   CALCIUM 9.5 07/21/2018 0951   CALCIUM 9.9 07/04/2016 1133   PROT 7.1 07/21/2018 0951   PROT 7.3 07/04/2016 1133   ALBUMIN 3.5 07/21/2018 0951   ALBUMIN 3.8 07/04/2016 1133   AST 25 07/21/2018 0951   AST 27 04/28/2017 1442   AST 19 07/04/2016 1133   ALT 21 07/21/2018 0951   ALT 41 04/28/2017 1442   ALT 17 07/04/2016 1133   ALKPHOS 72 07/21/2018 0951   ALKPHOS 172 (H) 07/04/2016 1133   BILITOT 1.7 (H) 07/21/2018 0951   BILITOT 0.7 04/28/2017 1442   BILITOT 0.94 07/04/2016 1133   GFRNONAA 46 (L) 07/21/2018 0951   GFRNONAA 36 (L) 04/28/2017 1442   GFRAA 53 (L) 07/21/2018 0951   GFRAA 42 (L) 04/28/2017 1442    INo results found for: SPEP, UPEP  Lab Results  Component Value Date   WBC 3.1 (L) 07/21/2018   NEUTROABS 2.2 07/21/2018   HGB 12.6 07/21/2018   HCT 36.7 07/21/2018   MCV 108.6 (H) 07/21/2018   PLT 204 07/21/2018      Chemistry      Component Value Date/Time   NA 142 07/21/2018 0951   NA 141 04/25/2017 1213   NA 145 07/04/2016 1133   K 4.0 07/21/2018 0951   K 4.2 07/04/2016 1133   CL 107 07/21/2018 0951   CO2 26  07/21/2018 0951   CO2 28 07/04/2016 1133   BUN 17 07/21/2018 0951   BUN 18 04/25/2017 1213   BUN 16.0 07/04/2016 1133   CREATININE 1.27 (H) 07/21/2018 0951   CREATININE 1.53 (H) 04/28/2017 1442   CREATININE 0.9 07/04/2016 1133      Component Value Date/Time   CALCIUM 9.5 07/21/2018 0951   CALCIUM 9.9 07/04/2016 1133   ALKPHOS 72 07/21/2018 0951   ALKPHOS 172 (H) 07/04/2016 1133   AST 25 07/21/2018 0951   AST 27 04/28/2017 1442   AST 19 07/04/2016 1133   ALT 21 07/21/2018 0951   ALT 41 04/28/2017 1442   ALT 17 07/04/2016 1133   BILITOT 1.7 (H) 07/21/2018 0951   BILITOT 0.7 04/28/2017 1442   BILITOT 0.94 07/04/2016 1133     No results found for: LABCA2  No components found for: LABCA125  No results for input(s): INR in the last 168 hours.  Urinalysis    Component Value Date/Time   COLORURINE YELLOW 07/20/2018 [place] 07/20/2018 1747   LABSPEC 1.019 07/20/2018 1747   PHURINE 5.0 07/20/2018 1747   GLUCOSEU NEGATIVE 07/20/2018 1747   GLUCOSEU NEGATIVE 10/20/2017 1303   HGBUR NEGATIVE 07/20/2018 1747   BILIRUBINUR NEGATIVE 07/20/2018 1747   KETONESUR 20 (A) 07/20/2018 1747   PROTEINUR NEGATIVE 07/20/2018 1747   UROBILINOGEN 1.0 10/20/2017 1303   NITRITE NEGATIVE 07/20/2018 1747   LEUKOCYTESUR NEGATIVE 07/20/2018 1747    STUDIES: Today's labs reviewed   ASSESSMENT: 63 y.o. [place] woman status  post left lumpectomy 12/28/2014 for ductal carcinoma in situ, high-grade measuring 0.2 cm, estrogen and progesterone receptor positive, with close but negative margins  (1) adjuvant radiation completed 03/24/2015  (2) anastrozole started 04/19/2015   (a) bone density 03/01/2016 shows a T score of -1.2  (3) mildly abnormal hepatic function panel: negative workup for hepatitis B and C, normal alpha-1-anti-trypsin  (4) left lower extremity DVT diagnosed by Doppler ultrasonography 04/16/2016, with negative CT angiogram chest  (a) on Xarelto starting  04/16/2016   (b) negative hypercoagulable panel, with a minimally abnormal (indeterminate) anti-cardiolipin IgM  ((c) stopped rivaroxaban 07/16/2016, repeat D-Dimer WNL August 2018  METASTATIC DISEASE: December 2019: bone only (5) CT chest 02/06/2018 shows a mixed lytic and sclerotic process with fracture at the inferior manubrium but no other bone lesions.  However bone scan 02/16/2018 shows multiple bone lesions consistent with metastatic disease; no lung or pleura involvement, no adenopathy  (a) Bone marrow biopsy on 03/03/2018 shows metastatic carcinoma consistent with breast primary, estrogen and progesterone receptor strongly positive, HER-2 not amplified (1+)  (b) MRI abdomen and MRI brain at [facility] (results viewable in "care everywhere") notes known bone involvement in spine and pelvis, but no other area of metastatic disease in abdomen/pelvis, or brain.  (c) CA 27.29 not informative (37.9 on 03/03/2018)  (6) started denosumab/Xgeva on 03/18/2018, repeated Q28 d  (7) started fulvestrant on 03/03/2018 and palbociclib 03/18/2018 at 125 mg/d 21/7   PLAN: Caitlin Wall is having an increasingly difficult time with radiation esophagitis.  We will support her through this challenge with daily IV fluids, IV nausea medicine if needed.  She will stop taking the Palbociclib until further notice.    Caitlin Wall met with Dr. Jana Hakim as well.  We will give her some IV Morphine today, and I will also write for her to receive Phenergan suppositories if needed.  If the IV Morphine helps her pain, we will give her some liquid morphine to take.    I reviewed dietary guidelines with esophagitis with Caitlin Wall, including drinking room temperature liquids, softer foods, and protein rich drinks such as ensure or boost.  I have placed an urgent referral to [facility] [place], our dietitian who can also work with her diet moving forward as she has lost almost 20 pounds since March.    Hopefully Caitlin Wall will feel better in the next two to  three days.  She will return daily for IV fluids through Saturday, and I will see her back in clinic on Thursday, 07/23/2018 with labs prior to touch base.  She knows to call for any other issue that may develop before her next visit here.  Caitlin Bihari, NP  07/21/18 3:38 PM Medical Oncology and Hematology [facility] [address] [place], [place] [address] Tel. [phone]    Fax. [phone]   Addendum: At the completion of her IF fluids I printed out I gave her information in her AVS about dysphagia and reviewed it, and her medications with her in detail and counseled her on risks and benefits of her new medications and counseled her to avoid taking medications that can make her sleepy such as morphine and phenergan near one another.   ADDENDUM: Caitlin Wall is distraught because of the uncontrolled pain and the fact that she cannot even swallow water at present.  We could certainly admit her for fluids but she did significantly improve with fluids here and she feels if we continue to do this on a daily basis that would be adequate.  We will serially monitor that.  I reassured her that the pain she is experiencing will be much better in 48 to 72 hours and I do expect by the end of the week she may not need fluids at all.  I also expect that her overall pain from the bone lesions themselves will be better after the radiation treatments  She is scheduled to see me again on 08/18/2018 we will offer a visit in approximately 2 weeks to make sure everything is improving as expected.   I personally saw this patient and performed a substantive portion of this encounter with the listed APP documented above.   Chauncey Cruel, MD Medical Oncology and Hematology [facility] [address] [place], [place] [address] Tel. [phone]    Fax. [phone]

## 2018-07-21 NOTE — Patient Instructions (Addendum)
Fulvestrant injection What is this medicine? FULVESTRANT (ful VES trant) blocks the effects of estrogen. It is used to treat breast cancer. This medicine may be used for other purposes; ask your health care provider or pharmacist if you have questions. COMMON BRAND NAME(S): FASLODEX What should I tell my health care provider before I take this medicine? They need to know if you have any of these conditions: -bleeding disorders -liver disease -low blood counts, like low white cell, platelet, or red cell counts -an unusual or allergic reaction to fulvestrant, other medicines, foods, dyes, or preservatives -pregnant or trying to get pregnant -breast-feeding How should I use this medicine? This medicine is for injection into a muscle. It is usually given by a health care professional in a hospital or clinic setting. Talk to your pediatrician regarding the use of this medicine in children. Special care may be needed. Overdosage: If you think you have taken too much of this medicine contact a poison control center or emergency room at once. NOTE: This medicine is only for you. Do not share this medicine with others. What if I miss a dose? It is important not to miss your dose. Call your doctor or health care professional if you are unable to keep an appointment. What may interact with this medicine? -medicines that treat or prevent blood clots like warfarin, enoxaparin, dalteparin, apixaban, dabigatran, and rivaroxaban This list may not describe all possible interactions. Give your health care provider a list of all the medicines, herbs, non-prescription drugs, or dietary supplements you use. Also tell them if you smoke, drink alcohol, or use illegal drugs. Some items may interact with your medicine. What should I watch for while using this medicine? Your condition will be monitored carefully while you are receiving this medicine. You will need important blood work done while you are taking this  medicine. Do not become pregnant while taking this medicine or for at least 1 year after stopping it. Women of child-bearing potential will need to have a negative pregnancy test before starting this medicine. Women should inform their doctor if they wish to become pregnant or think they might be pregnant. There is a potential for serious side effects to an unborn child. Men should inform their doctors if they wish to father a child. This medicine may lower sperm counts. Talk to your health care professional or pharmacist for more information. Do not breast-feed an infant while taking this medicine or for 1 year after the last dose. What side effects may I notice from receiving this medicine? Side effects that you should report to your doctor or health care professional as soon as possible: -allergic reactions like skin rash, itching or hives, swelling of the face, lips, or tongue -feeling faint or lightheaded, falls -pain, tingling, numbness, or weakness in the legs -signs and symptoms of infection like fever or chills; cough; flu-like symptoms; sore throat -vaginal bleeding Side effects that usually do not require medical attention (report to your doctor or health care professional if they continue or are bothersome): -aches, pains -constipation -diarrhea -headache -hot flashes -nausea, vomiting -pain at site where injected -stomach pain This list may not describe all possible side effects. Call your doctor for medical advice about side effects. You may report side effects to FDA at 1-800-FDA-1088. Where should I keep my medicine? This drug is given in a hospital or clinic and will not be stored at home. NOTE: This sheet is a summary. It may not cover all possible information. If you  have questions about this medicine, talk to your doctor, pharmacist, or health care provider.  2019 Elsevier/Gold Standard (2017-05-22 11:34:41) Denosumab injection What is this medicine? DENOSUMAB (den oh  sue mab) slows bone breakdown. Prolia is used to treat osteoporosis in women after menopause and in men, and in people who are taking corticosteroids for 6 months or more. Delton See is used to treat a high calcium level due to cancer and to prevent bone fractures and other bone problems caused by multiple myeloma or cancer bone metastases. Delton See is also used to treat giant cell tumor of the bone. This medicine may be used for other purposes; ask your health care provider or pharmacist if you have questions. COMMON BRAND NAME(S): Prolia, XGEVA What should I tell my health care provider before I take this medicine? They need to know if you have any of these conditions: -dental disease -having surgery or tooth extraction -infection -kidney disease -low levels of calcium or Vitamin D in the blood -malnutrition -on hemodialysis -skin conditions or sensitivity -thyroid or parathyroid disease -an unusual reaction to denosumab, other medicines, foods, dyes, or preservatives -pregnant or trying to get pregnant -breast-feeding How should I use this medicine? This medicine is for injection under the skin. It is given by a health care professional in a hospital or clinic setting. A special MedGuide will be given to you before each treatment. Be sure to read this information carefully each time. For Prolia, talk to your pediatrician regarding the use of this medicine in children. Special care may be needed. For Delton See, talk to your pediatrician regarding the use of this medicine in children. While this drug may be prescribed for children as young as 13 years for selected conditions, precautions do apply. Overdosage: If you think you have taken too much of this medicine contact a poison control center or emergency room at once. NOTE: This medicine is only for you. Do not share this medicine with others. What if I miss a dose? It is important not to miss your dose. Call your doctor or health care professional if  you are unable to keep an appointment. What may interact with this medicine? Do not take this medicine with any of the following medications: -other medicines containing denosumab This medicine may also interact with the following medications: -medicines that lower your chance of fighting infection -steroid medicines like prednisone or cortisone This list may not describe all possible interactions. Give your health care provider a list of all the medicines, herbs, non-prescription drugs, or dietary supplements you use. Also tell them if you smoke, drink alcohol, or use illegal drugs. Some items may interact with your medicine. What should I watch for while using this medicine? Visit your doctor or health care professional for regular checks on your progress. Your doctor or health care professional may order blood tests and other tests to see how you are doing. Call your doctor or health care professional for advice if you get a fever, chills or sore throat, or other symptoms of a cold or flu. Do not treat yourself. This drug may decrease your body's ability to fight infection. Try to avoid being around people who are sick. You should make sure you get enough calcium and vitamin D while you are taking this medicine, unless your doctor tells you not to. Discuss the foods you eat and the vitamins you take with your health care professional. See your dentist regularly. Brush and floss your teeth as directed. Before you have any dental work  dental work done, tell your dentist you are receiving this medicine. Do not become pregnant while taking this medicine or for 5 months after stopping it. Talk with your doctor or health care professional about your birth control options while taking this medicine. Women should inform their doctor if they wish to become pregnant or think they might be pregnant. There is a potential for serious side effects to an unborn child. Talk to your health care professional or pharmacist for  more information. What side effects may I notice from receiving this medicine? Side effects that you should report to your doctor or health care professional as soon as possible: -allergic reactions like skin rash, itching or hives, swelling of the face, lips, or tongue -bone pain -breathing problems -dizziness -jaw pain, especially after dental work -redness, blistering, peeling of the skin -signs and symptoms of infection like fever or chills; cough; sore throat; pain or trouble passing urine -signs of low calcium like fast heartbeat, muscle cramps or muscle pain; pain, tingling, numbness in the hands or feet; seizures -unusual bleeding or bruising -unusually weak or tired Side effects that usually do not require medical attention (report to your doctor or health care professional if they continue or are bothersome): -constipation -diarrhea -headache -joint pain -loss of appetite -muscle pain -runny nose -tiredness -upset stomach This list may not describe all possible side effects. Call your doctor for medical advice about side effects. You may report side effects to FDA at 1-800-FDA-1088. Where should I keep my medicine? This medicine is only given in a clinic, doctor's office, or other health care setting and will not be stored at home. NOTE: This sheet is a summary. It may not cover all possible information. If you have questions about this medicine, talk to your doctor, pharmacist, or health care provider.  2019 Elsevier/Gold Standard (2017-06-20 16:10:44)  

## 2018-07-22 ENCOUNTER — Inpatient Hospital Stay: Payer: 59

## 2018-07-22 ENCOUNTER — Other Ambulatory Visit: Payer: Self-pay

## 2018-07-22 VITALS — BP 126/59 | HR 81 | Temp 97.9°F | Resp 16

## 2018-07-22 DIAGNOSIS — Z5111 Encounter for antineoplastic chemotherapy: Secondary | ICD-10-CM | POA: Diagnosis not present

## 2018-07-22 DIAGNOSIS — C50512 Malignant neoplasm of lower-outer quadrant of left female breast: Secondary | ICD-10-CM

## 2018-07-22 DIAGNOSIS — C7951 Secondary malignant neoplasm of bone: Secondary | ICD-10-CM

## 2018-07-22 MED ORDER — ONDANSETRON HCL 4 MG/2ML IJ SOLN
INTRAMUSCULAR | Status: AC
  Filled 2018-07-22: qty 4

## 2018-07-22 MED ORDER — SODIUM CHLORIDE 0.9 % IV SOLN
INTRAVENOUS | Status: DC
  Administered 2018-07-22: 15:00:00 via INTRAVENOUS
  Filled 2018-07-22 (×2): qty 250

## 2018-07-22 MED ORDER — ONDANSETRON HCL 4 MG/2ML IJ SOLN
8.0000 mg | Freq: Once | INTRAMUSCULAR | Status: AC
  Administered 2018-07-22: 8 mg via INTRAVENOUS

## 2018-07-22 NOTE — Patient Instructions (Signed)
Nausea, Adult Nausea is feeling sick to your stomach or feeling that you are about to throw up (vomit). Feeling sick to your stomach is usually not serious, but it may be an early sign of a more serious medical problem. As you feel sicker to your stomach, you may throw up. If you throw up, or if you are not able to drink enough fluids, there is a risk that you may lose too much water in your body (get dehydrated). If you lose too much water in your body, you may:  Feel tired.  Feel thirsty.  Have a dry mouth.  Have cracked lips.  Go pee (urinate) less often. Older adults and people who have other diseases or a weak body defense system (immune system) have a higher risk of losing too much water in the body. The main goals of treating this condition are:  To relieve your nausea.  To ensure your nausea occurs less often.  To prevent throwing up and losing too much fluid. Follow these instructions at home: Watch your symptoms for any changes. Tell your doctor about them. Follow these instructions as told by your doctor. Eating and drinking      Take an ORS (oral rehydration solution). This is a drink that is sold at pharmacies and stores.  Drink clear fluids in small amounts as you are able. These include: ? Water. ? Ice chips. ? Fruit juice that has water added (diluted fruit juice). ? Low-calorie sports drinks.  Eat bland, easy-to-digest foods in small amounts as you are able, such as: ? Bananas. ? Applesauce. ? Rice. ? Low-fat (lean) meats. ? Toast. ? Crackers.  Avoid drinking fluids that have a lot of sugar or caffeine in them. This includes energy drinks, sports drinks, and soda.  Avoid alcohol.  Avoid spicy or fatty foods. General instructions  Take over-the-counter and prescription medicines only as told by your doctor.  Rest at home while you get better.  Drink enough fluid to keep your pee (urine) pale yellow.  Take slow and deep breaths when you feel  sick to your stomach.  Avoid food or things that have strong smells.  Wash your hands often with soap and water. If you cannot use soap and water, use hand sanitizer.  Make sure that all people in your home wash their hands well and often.  Keep all follow-up visits as told by your doctor. This is important. Contact a doctor if:  You feel sicker to your stomach.  You feel sick to your stomach for more than 2 days.  You throw up.  You are not able to drink fluids without throwing up.  You have new symptoms.  You have a fever.  You have a headache.  You have muscle cramps.  You have a rash.  You have pain while peeing.  You feel light-headed or dizzy. Get help right away if:  You have pain in your chest, neck, arm, or jaw.  You feel very weak or you pass out (faint).  You have throw up that is bright red or looks like coffee grounds.  You have bloody or black poop (stools) or poop that looks like tar.  You have a very bad headache, a stiff neck, or both.  You have very bad pain, cramping, or bloating in your belly (abdomen).  You have trouble breathing or you are breathing very quickly.  Your heart is beating very quickly.  Your skin feels cold and clammy.  You feel confused.    You have signs of losing too much water in your body, such as: ? Dark pee, very little pee, or no pee. ? Cracked lips. ? Dry mouth. ? Sunken eyes. ? Sleepiness. ? Weakness. These symptoms may be an emergency. Do not wait to see if the symptoms will go away. Get medical help right away. Call your local emergency services (911 in the [place] Do not drive yourself to the hospital. Summary  Nausea is feeling sick to your stomach or feeling that you are about to throw up (vomit).  If you throw up, or if you are not able to drink enough fluids, there is a risk that you may lose too much water in your body (get dehydrated).  Eat and drink what your doctor tells you. Take  over-the-counter and prescription medicines only as told by your doctor.  Contact a doctor right away if your symptoms get worse or you have new symptoms.  Keep all follow-up visits as told by your doctor. This is important. This information is not intended to replace advice given to you by your health care provider. Make sure you discuss any questions you have with your health care provider. Document Released: 01/31/2011 Document Revised: 07/22/2017 Document Reviewed: 07/22/2017 Elsevier Interactive Patient Education  2019 [employer]   Dehydration, Adult  Dehydration is when there is not enough fluid or water in your body. This happens when you lose more fluids than you take in. Dehydration can range from mild to very bad. It should be treated right away to keep it from getting very bad. Symptoms of mild dehydration may include:  Thirst.  Dry lips.  Slightly dry mouth.  Dry, warm skin.  Dizziness. Symptoms of moderate dehydration may include:  Very dry mouth.  Muscle cramps.  Dark pee (urine). Pee may be the color of tea.  Your body making less pee.  Your eyes making fewer tears.  Heartbeat that is uneven or faster than normal (palpitations).  Headache.  Light-headedness, especially when you stand up from sitting.  Fainting (syncope). Symptoms of very bad dehydration may include:  Changes in skin, such as: ? Cold and clammy skin. ? Blotchy (mottled) or pale skin. ? Skin that does not quickly return to normal after being lightly pinched and let go (poor skin turgor).  Changes in body fluids, such as: ? Feeling very thirsty. ? Your eyes making fewer tears. ? Not sweating when body temperature is high, such as in hot weather. ? Your body making very little pee.  Changes in vital signs, such as: ? Weak pulse. ? Pulse that is more than 100 beats a minute when you are sitting still. ? Fast breathing. ? Low blood pressure.  Other changes, such as: ? Sunken  eyes. ? Cold hands and feet. ? Confusion. ? Lack of energy (lethargy). ? Trouble waking up from sleep. ? Short-term weight loss. ? Unconsciousness. Follow these instructions at home:   If told by your doctor, drink an ORS: ? Make an ORS by using instructions on the package. ? Start by drinking small amounts, about  cup (120 mL) every 5-10 minutes. ? Slowly drink more until you have had the amount that your doctor said to have.  Drink enough clear fluid to keep your pee clear or pale yellow. If you were told to drink an ORS, finish the ORS first, then start slowly drinking clear fluids. Drink fluids such as: ? Water. Do not drink only water by itself. Doing that can make the salt (  sodium) level in your body get too low (hyponatremia). ? Ice chips. ? Fruit juice that you have added water to (diluted). ? Low-calorie sports drinks.  Avoid: ? Alcohol. ? Drinks that have a lot of sugar. These include high-calorie sports drinks, fruit juice that does not have water added, and soda. ? Caffeine. ? Foods that are greasy or have a lot of fat or sugar.  Take over-the-counter and prescription medicines only as told by your doctor.  Do not take salt tablets. Doing that can make the salt level in your body get too high (hypernatremia).  Eat foods that have minerals (electrolytes). Examples include bananas, oranges, potatoes, tomatoes, and spinach.  Keep all follow-up visits as told by your doctor. This is important. Contact a doctor if:  You have belly (abdominal) pain that: ? Gets worse. ? Stays in one area (localizes).  You have a rash.  You have a stiff neck.  You get angry or annoyed more easily than normal (irritability).  You are more sleepy than normal.  You have a harder time waking up than normal.  You feel: ? Weak. ? Dizzy. ? Very thirsty.  You have peed (urinated) only a small amount of very dark pee during 6-8 hours. Get help right away if:  You have symptoms  of very bad dehydration.  You cannot drink fluids without throwing up (vomiting).  Your symptoms get worse with treatment.  You have a fever.  You have a very bad headache.  You are throwing up or having watery poop (diarrhea) and it: ? Gets worse. ? Does not go away.  You have blood or something green (bile) in your throw-up.  You have blood in your poop (stool). This may cause poop to look black and tarry.  You have not peed in 6-8 hours.  You pass out (faint).  Your heart rate when you are sitting still is more than 100 beats a minute.  You have trouble breathing. This information is not intended to replace advice given to you by your health care provider. Make sure you discuss any questions you have with your health care provider. Document Released: 12/08/2008 Document Revised: 09/01/2015 Document Reviewed: 04/07/2015 Elsevier Interactive Patient Education  2019 [employer].

## 2018-07-23 ENCOUNTER — Inpatient Hospital Stay: Payer: 59

## 2018-07-23 ENCOUNTER — Telehealth: Payer: Self-pay

## 2018-07-23 ENCOUNTER — Inpatient Hospital Stay (HOSPITAL_BASED_OUTPATIENT_CLINIC_OR_DEPARTMENT_OTHER): Payer: 59 | Admitting: Adult Health

## 2018-07-23 ENCOUNTER — Encounter: Payer: Self-pay | Admitting: Adult Health

## 2018-07-23 ENCOUNTER — Ambulatory Visit: Payer: 59 | Admitting: Adult Health

## 2018-07-23 ENCOUNTER — Other Ambulatory Visit: Payer: Self-pay | Admitting: Adult Health

## 2018-07-23 ENCOUNTER — Other Ambulatory Visit: Payer: Self-pay

## 2018-07-23 ENCOUNTER — Ambulatory Visit: Payer: 59

## 2018-07-23 DIAGNOSIS — Z170 Estrogen receptor positive status [ER+]: Secondary | ICD-10-CM | POA: Diagnosis not present

## 2018-07-23 DIAGNOSIS — K208 Other esophagitis: Secondary | ICD-10-CM | POA: Diagnosis not present

## 2018-07-23 DIAGNOSIS — C7951 Secondary malignant neoplasm of bone: Secondary | ICD-10-CM

## 2018-07-23 DIAGNOSIS — C50512 Malignant neoplasm of lower-outer quadrant of left female breast: Secondary | ICD-10-CM | POA: Diagnosis not present

## 2018-07-23 DIAGNOSIS — Z87891 Personal history of nicotine dependence: Secondary | ICD-10-CM

## 2018-07-23 DIAGNOSIS — Z5111 Encounter for antineoplastic chemotherapy: Secondary | ICD-10-CM | POA: Diagnosis not present

## 2018-07-23 DIAGNOSIS — G893 Neoplasm related pain (acute) (chronic): Secondary | ICD-10-CM

## 2018-07-23 LAB — CMP (CANCER CENTER ONLY)
ALT: 25 U/L (ref 0–44)
AST: 35 U/L (ref 15–41)
Albumin: 3.5 g/dL (ref 3.5–5.0)
Alkaline Phosphatase: 76 U/L (ref 38–126)
Anion gap: 8 (ref 5–15)
BUN: 10 mg/dL (ref 8–23)
CO2: 26 mmol/L (ref 22–32)
Calcium: 8.4 mg/dL — ABNORMAL LOW (ref 8.9–10.3)
Chloride: 106 mmol/L (ref 98–111)
Creatinine: 1.09 mg/dL — ABNORMAL HIGH (ref 0.44–1.00)
GFR, Est AFR Am: >60 mL/min (ref >60)
GFR, Estimated: 55 mL/min — ABNORMAL LOW (ref >60)
Glucose, Bld: 102 mg/dL — ABNORMAL HIGH (ref 70–99)
Potassium: 3.5 mmol/L (ref 3.5–5.1)
Sodium: 140 mmol/L (ref 135–145)
Total Bilirubin: 1.1 mg/dL (ref 0.3–1.2)
Total Protein: 6.8 g/dL (ref 6.5–8.1)

## 2018-07-23 LAB — CBC WITH DIFFERENTIAL (CANCER CENTER ONLY)
Abs Immature Granulocytes: 0 10*3/uL (ref 0.00–0.07)
Basophils Absolute: 0.1 10*3/uL (ref 0.0–0.1)
Basophils Relative: 2 %
Eosinophils Absolute: 0.1 10*3/uL (ref 0.0–0.5)
Eosinophils Relative: 2 %
HCT: 32.5 % — ABNORMAL LOW (ref 36.0–46.0)
Hemoglobin: 11.1 g/dL — ABNORMAL LOW (ref 12.0–15.0)
Immature Granulocytes: 0 %
Lymphocytes Relative: 18 %
Lymphs Abs: 0.6 10*3/uL — ABNORMAL LOW (ref 0.7–4.0)
MCH: 37 pg — ABNORMAL HIGH (ref 26.0–34.0)
MCHC: 34.2 g/dL (ref 30.0–36.0)
MCV: 108.3 fL — ABNORMAL HIGH (ref 80.0–100.0)
Monocytes Absolute: 0.5 10*3/uL (ref 0.1–1.0)
Monocytes Relative: 16 %
Neutro Abs: 1.8 10*3/uL (ref 1.7–7.7)
Neutrophils Relative %: 62 %
Platelet Count: 167 10*3/uL (ref 150–400)
RBC: 3 MIL/uL — ABNORMAL LOW (ref 3.87–5.11)
RDW: 16 % — ABNORMAL HIGH (ref 11.5–15.5)
WBC Count: 3 10*3/uL — ABNORMAL LOW (ref 4.0–10.5)
nRBC: 0 % (ref 0.0–0.2)

## 2018-07-23 MED ORDER — ONDANSETRON HCL 4 MG/2ML IJ SOLN
8.0000 mg | Freq: Once | INTRAMUSCULAR | Status: AC
  Administered 2018-07-23: 8 mg via INTRAVENOUS

## 2018-07-23 MED ORDER — ONDANSETRON HCL 4 MG/2ML IJ SOLN
INTRAMUSCULAR | Status: AC
  Filled 2018-07-23: qty 4

## 2018-07-23 MED ORDER — SODIUM CHLORIDE 0.9 % IV SOLN
INTRAVENOUS | Status: DC
  Administered 2018-07-23: 14:00:00 via INTRAVENOUS
  Filled 2018-07-23 (×2): qty 250

## 2018-07-23 NOTE — Progress Notes (Signed)
[facility]  [phone] [phone]    ID: Caitlin Wall DOB: [birth year]  MR#: [id number]  RSW#:[id number]  Patient Care Team: Biagio Borg, MD as PCP - General Tamala Julian Lynnell Dike, MD as PCP - Cardiology (Cardiology) Magrinat, Virgie Dad, MD as Consulting Physician (Oncology) Kyung Rudd, MD as Consulting Physician (Radiation Oncology) Jovita Kussmaul, MD as Consulting Physician (General Surgery) Sylvan Cheese, NP as Nurse Practitioner (Hematology and Oncology) OTHER MD: Keturah Barre MD   CHIEF COMPLAINT: Metastatic estrogen receptor positive breast cancer  CURRENT TREATMENT: Denosumab/Xgeva, fluvestrant, palbociclib   BREAST CANCER HISTORY:  From the original intake note:   Caitlin Wall [facility]. She has subpectoral saline implants in place. There was a possible asymmetry in the left breast and she was recalled for left diagnostic mammography with tomosynthesis and left breast ultrasonography 11/03/2014. The breast density was category C. In the left breast lower outer quadrant there was an irregular mass which was not palpable and which by ultrasonography measured 0.6 cm. The left axilla was sonographically benign. Biopsy of this mass 11/09/2014 showed ([id number]) fibroadipose adipose tissue. This was felt to be discordant.  Accordingly the patient was referred to surgery and she underwent left breast radioactive seed localized lumpectomy 12/28/2014. The pathology from that procedure (([id number]) showed ductal carcinoma in situ, high-grade, measuring 0.2 cm. This was less than 0.1 cm from the posterior margin. The cells was estrogen receptor positive at 95%, with strong staining intensity, progesterone receptor positive at 30% with moderate staining intensity.  Her subsequent history is as detailed below.   INTERVAL HISTORY: Caitlin Wall is here today for IV fluids and I am seeing her in  infusion room.  She was last seen two days ago and was tearful and miserable due to radiation induced esophagitis. She is doing better.  She still has pain and reflux, however is able to swallow now, which is a step in the right direction.    REVIEW OF SYSTEMS: The fluids and IV zofran have been helping as well.  She denies any new pain.  She is without any fever, chills, cough or shortness of breath.  Her LBM was yesterday.  A detailed ROS was otherwise non contributory.  PAST MEDICAL HISTORY: Past Medical History:  Diagnosis Date  . Asthma, persistent not controlled 07/27/2012  . Breast cancer ([place]) 12/28/14   Left BresastDCIS  . COLONIC POLYPS, HX OF 11/04/2007  . COPD (chronic obstructive pulmonary disease) ([place])   . GERD 11/04/2007  . GERD (gastroesophageal reflux disease)   . HAIR LOSS 11/04/2007  . Hx of cardiac cath 11/15/2016   a. LHC 11/15/16 showed normal coronaries and EF 50-55% with normal EDP. (done for false positive NST)  . Hx of colonic polyps 1995   adenomatous  . Hyperlipidemia   . HYPERLIPIDEMIA 11/04/2007  . Hypertension   . HYPERTENSION 06/07/2009  . LBBB (left bundle branch block) 07/26/2011  . Personal history of radiation therapy   . S/P radiation therapy 02/02/15-03/24/15   left breast 62.4GY    PAST SURGICAL HISTORY: Past Surgical History:  Procedure Laterality Date  . ABDOMINAL HYSTERECTOMY    . ANTERIOR FUSION CERVICAL SPINE    . AUGMENTATION MAMMAPLASTY Bilateral   . bilat ear surgury/hearing loss    . BREAST LUMPECTOMY Left 2016  . BREAST LUMPECTOMY WITH RADIOACTIVE SEED LOCALIZATION Left 12/28/2014   Procedure: BREAST LUMPECTOMY WITH RADIOACTIVE SEED LOCALIZATION;  Surgeon: Autumn Messing III,  MD;  Location: [facility];  Service: General;  Laterality: Left;  . COLONOSCOPY    . LEFT HEART CATH AND CORONARY ANGIOGRAPHY N/A 11/15/2016   Procedure: LEFT HEART CATH AND CORONARY ANGIOGRAPHY;  Surgeon: Belva Crome, MD;  Location: [facility];   Service: Cardiovascular;  Laterality: N/A;  . POLYPECTOMY      FAMILY HISTORY: Family History  Problem Relation Age of Onset  . Breast cancer Other   . Cancer Paternal Grandmother        breast  . Lung cancer Father   . Breast cancer Maternal Grandmother   . Healthy Mother   . Coronary artery disease Neg Hx   . Colon cancer Neg Hx   . Pancreatic cancer Neg Hx   . Rectal cancer Neg Hx   . Stomach cancer Neg Hx   . Esophageal cancer Neg Hx    [name] died from lung cancer in the setting of tobacco abuse at age 4. [name] died from "old age" at age 63. The patient had one brother, 3 sisters. The brother had" I cancer" requiring enucleation--the patient does not know whether this was a primary ocular melanoma. [name] was diagnosed with breast cancer but the patient does not know at what age. There is no other history of breast or ovarian cancer in the family   GYNECOLOGIC HISTORY:  No LMP recorded. Patient has had a hysterectomy. Menarche age 66, first live birth age 55. The patient is GX P3. She is status post hysterectomy without salpingo-oophorectomy. She did not take hormone replacement. She did use oral contraceptives remotely without any complications.   SOCIAL HISTORY:  Caitlin Wall works in accounts payable. Her husband Caitlin Wall is disabled because of back problems. Daughter Caitlin Wall is a Marine scientist working in rehabilitation in [place]. Son Caitlin Wall lives in [place] where he works as a Games developer. Son Caitlin Wall lives in [place] where he works as a Emergency planning/management officer. The patient has 4 grandchildren. She is not a Ambulance person.    ADVANCED DIRECTIVES: Not in place   HEALTH MAINTENANCE: Social History   Tobacco Use  . Smoking status: Former Smoker    Packs/day: 1.00    Years: 20.00    Pack years: 20.00    Types: Cigarettes    Last attempt to quit: 08/19/1989    Years since quitting: 28.9  . Smokeless tobacco: Never Used   Substance Use Topics  . Alcohol use: No  . Drug use: No    Colonoscopy:  PAP:  Bone density:  Lipid panel:  Allergies  Allergen Reactions  . Codeine     REACTION: nausea,  . Tramadol Hcl Nausea Only    Current Outpatient Medications  Medication Sig Dispense Refill  . amLODipine (NORVASC) 2.5 MG tablet Take 1 tablet (2.5 mg total) by mouth daily. 90 tablet 3  . aspirin EC 81 MG tablet Take 81 mg by mouth daily.    . cholecalciferol (VITAMIN D3) 25 MCG (1000 UT) tablet Take 1 tablet (1,000 Units total) by mouth daily.    Marland Kitchen GERI-MOX 200-200-20 MG/5ML suspension Take 30 mLs by mouth as needed for heartburn.     Marland Kitchen HYDROcodone-acetaminophen (NORCO) 7.5-325 MG tablet Take 1 tablet by mouth at bedtime. 30 tablet 0  . Ipratropium-Albuterol (COMBIVENT) 20-100 MCG/ACT AERS respimat Inhale 1 puff into the lungs every 6 (six) hours. 1 Inhaler 0  . Liniments (BLUE-EMU SUPER STRENGTH EX) Apply 1 application topically daily as needed (pain).    Marland Kitchen  losartan (COZAAR) 100 MG tablet Take 1 tablet (100 mg total) by mouth daily. 90 tablet 3  . morphine 10 MG/5ML solution Take 1.3-2.5 mLs (2.6-5 mg total) by mouth every 6 (six) hours as needed for severe pain. 30 mL 0  . ondansetron (ZOFRAN) 8 MG tablet Take 1 tablet (8 mg total) by mouth every 8 (eight) hours as needed for nausea or vomiting. 20 tablet 0  . palbociclib (IBRANCE) 125 MG capsule Take 1 capsule (125 mg total) by mouth daily. Take after lunch. Take for 21 days on, 7 days off, repeat every 28 days. 21 capsule 6  . pantoprazole (PROTONIX) 20 MG tablet Take 1 tablet (20 mg total) by mouth daily. 30 tablet 0  . promethazine (PHENERGAN) 25 MG suppository Place 1 suppository (25 mg total) rectally every 6 (six) hours as needed for nausea or vomiting. 12 each 0  . Spacer/Aero-Holding Chambers DEVI Spacer to be used with inhaler 1 each 0  . sucralfate (CARAFATE) 1 g tablet Take 1 tablet (1 g total) by mouth 4 (four) times daily. 120 tablet 1  .  valACYclovir (VALTREX) 500 MG tablet Take 1 tablet (500 mg total) by mouth 2 (two) times daily. 60 tablet 1   No current facility-administered medications for this visit.    Facility-Administered Medications Ordered in Other Visits  Medication Dose Route Frequency Provider Last Rate Last Dose  . 0.9 %  sodium chloride infusion   Intravenous Continuous Gardenia Phlegm, NP   Stopped at 07/23/18 1512    OBJECTIVE: Seen in infusion, please see VS in [facility]  There were no vitals filed for this visit.   There is no height or weight on file to calculate BMI.    ECOG FS:1 - Symptomatic but completely ambulatory GENERAL: Patient appears improved, in no apparent distress HEENT:  Sclerae anicteric.  Oropharynx clear and moist. No ulcerations or evidence of oropharyngeal candidiasis. Neck is supple.  NODES:  No cervical, supraclavicular lymphadenopathy palpated.  BREAST EXAM:  Deferred. LUNGS:  Clear to auscultation bilaterally.  No wheezes or rhonchi. HEART:  Regular rate and rhythm. No murmur appreciated. ABDOMEN:  Soft, nontender.  Positive, normoactive bowel sounds. No organomegaly palpated. MSK:  No focal spinal tenderness to palpation.  EXTREMITIES:  No peripheral edema.   SKIN:  Clear with no obvious rashes or skin changes. No nail dyscrasia. NEURO:  Nonfocal. Well oriented.  Normal affect.     LAB RESULTS:  CMP     Component Value Date/Time   NA 140 07/23/2018 1339   NA 141 04/25/2017 1213   NA 145 07/04/2016 1133   K 3.5 07/23/2018 1339   K 4.2 07/04/2016 1133   CL 106 07/23/2018 1339   CO2 26 07/23/2018 1339   CO2 28 07/04/2016 1133   GLUCOSE 102 (H) 07/23/2018 1339   GLUCOSE 101 07/04/2016 1133   BUN 10 07/23/2018 1339   BUN 18 04/25/2017 1213   BUN 16.0 07/04/2016 1133   CREATININE 1.09 (H) 07/23/2018 1339   CREATININE 0.9 07/04/2016 1133   CALCIUM 8.4 (L) 07/23/2018 1339   CALCIUM 9.9 07/04/2016 1133   PROT 6.8 07/23/2018 1339   PROT 7.3 07/04/2016 1133    ALBUMIN 3.5 07/23/2018 1339   ALBUMIN 3.8 07/04/2016 1133   AST 35 07/23/2018 1339   AST 19 07/04/2016 1133   ALT 25 07/23/2018 1339   ALT 17 07/04/2016 1133   ALKPHOS 76 07/23/2018 1339   ALKPHOS 172 (H) 07/04/2016 1133   BILITOT 1.1 07/23/2018  1339   BILITOT 0.94 07/04/2016 1133   GFRNONAA 55 (L) 07/23/2018 1339   GFRAA >60 07/23/2018 1339    INo results found for: SPEP, UPEP  Lab Results  Component Value Date   WBC 3.0 (L) 07/23/2018   NEUTROABS 1.8 07/23/2018   HGB 11.1 (L) 07/23/2018   HCT 32.5 (L) 07/23/2018   MCV 108.3 (H) 07/23/2018   PLT 167 07/23/2018      Chemistry      Component Value Date/Time   NA 140 07/23/2018 1339   NA 141 04/25/2017 1213   NA 145 07/04/2016 1133   K 3.5 07/23/2018 1339   K 4.2 07/04/2016 1133   CL 106 07/23/2018 1339   CO2 26 07/23/2018 1339   CO2 28 07/04/2016 1133   BUN 10 07/23/2018 1339   BUN 18 04/25/2017 1213   BUN 16.0 07/04/2016 1133   CREATININE 1.09 (H) 07/23/2018 1339   CREATININE 0.9 07/04/2016 1133      Component Value Date/Time   CALCIUM 8.4 (L) 07/23/2018 1339   CALCIUM 9.9 07/04/2016 1133   ALKPHOS 76 07/23/2018 1339   ALKPHOS 172 (H) 07/04/2016 1133   AST 35 07/23/2018 1339   AST 19 07/04/2016 1133   ALT 25 07/23/2018 1339   ALT 17 07/04/2016 1133   BILITOT 1.1 07/23/2018 1339   BILITOT 0.94 07/04/2016 1133     No results found for: LABCA2  No components found for: LABCA125  No results for input(s): INR in the last 168 hours.  Urinalysis    Component Value Date/Time   COLORURINE YELLOW 07/20/2018 [place] 07/20/2018 1747   LABSPEC 1.019 07/20/2018 1747   PHURINE 5.0 07/20/2018 1747   GLUCOSEU NEGATIVE 07/20/2018 1747   GLUCOSEU NEGATIVE 10/20/2017 1303   HGBUR NEGATIVE 07/20/2018 1747   BILIRUBINUR NEGATIVE 07/20/2018 1747   KETONESUR 20 (A) 07/20/2018 1747   PROTEINUR NEGATIVE 07/20/2018 1747   UROBILINOGEN 1.0 10/20/2017 1303   NITRITE NEGATIVE 07/20/2018 1747    LEUKOCYTESUR NEGATIVE 07/20/2018 1747    STUDIES: Today's labs reviewed   ASSESSMENT: 63 y.o. [place] woman status post left lumpectomy 12/28/2014 for ductal carcinoma in situ, high-grade measuring 0.2 cm, estrogen and progesterone receptor positive, with close but negative margins  (1) adjuvant radiation completed 03/24/2015  (2) anastrozole started 04/19/2015   (a) bone density 03/01/2016 shows a T score of -1.2  (3) mildly abnormal hepatic function panel: negative workup for hepatitis B and C, normal alpha-1-anti-trypsin  (4) left lower extremity DVT diagnosed by Doppler ultrasonography 04/16/2016, with negative CT angiogram chest  (a) on Xarelto starting 04/16/2016   (b) negative hypercoagulable panel, with a minimally abnormal (indeterminate) anti-cardiolipin IgM  ((c) stopped rivaroxaban 07/16/2016, repeat D-Dimer WNL August 2018  METASTATIC DISEASE: December 2019: bone only (5) CT chest 02/06/2018 shows a mixed lytic and sclerotic process with fracture at the inferior manubrium but no other bone lesions.  However bone scan 02/16/2018 shows multiple bone lesions consistent with metastatic disease; no lung or pleura involvement, no adenopathy  (a) Bone marrow biopsy on 03/03/2018 shows metastatic carcinoma consistent with breast primary, estrogen and progesterone receptor strongly positive, HER-2 not amplified (1+)  (b) MRI abdomen and MRI brain at [facility] (results viewable in "care everywhere") notes known bone involvement in spine and pelvis, but no other area of metastatic disease in abdomen/pelvis, or brain.  (c) CA 27.29 not informative (37.9 on 03/03/2018)  (6) started denosumab/Xgeva on 03/18/2018, repeated Q28 d  (7) started fulvestrant on  03/03/2018 and palbociclib 03/18/2018 at 125 mg/d 21/7   PLAN: Caitlin Wall is slowly improving regarding her radiation induced esophagitis.  She will continue with her current regimen and I celebrated the fact that she has been  able to get some nutrition in.  This is promising.  She didn't get the elixir that the ER called in for her to help with the esophagitis.  WE will call her in magic mouthwash instead.  I anticipate that she will continue to slowly improve.    Caitlin Wall will continue to receive IV fluids tomorrow and Saturday with IV Zofran.  We will see her back next week to make sure she continues to improve and to decide whether or not we can restart her Palbociclib.    Caitlin Wall knows to call for any questions or concerns prior to her next appointment with [place].    A total of (20) minutes of face-to-face time was spent with this patient with greater than 50% of that time in counseling and care-coordination.  Wilber Bihari, NP  07/23/18 3:44 PM Medical Oncology and Hematology [facility] [address] [place], [place] [address] Tel. [phone]    Fax. [phone]

## 2018-07-23 NOTE — Telephone Encounter (Signed)
Spoke with patient this morning concerning appointment for today for IVF.  Patient was supposed to have an appointment with NP scheduled as well as IVF but it was not.  Let patient know that NP will see her in infusion today.  Patient voiced understanding.

## 2018-07-24 ENCOUNTER — Inpatient Hospital Stay: Payer: 59

## 2018-07-24 ENCOUNTER — Other Ambulatory Visit: Payer: Self-pay

## 2018-07-24 ENCOUNTER — Inpatient Hospital Stay: Payer: 59 | Admitting: Nutrition

## 2018-07-24 VITALS — BP 109/76 | HR 76 | Temp 98.2°F | Resp 18

## 2018-07-24 DIAGNOSIS — C50512 Malignant neoplasm of lower-outer quadrant of left female breast: Secondary | ICD-10-CM

## 2018-07-24 DIAGNOSIS — G893 Neoplasm related pain (acute) (chronic): Secondary | ICD-10-CM

## 2018-07-24 DIAGNOSIS — Z5111 Encounter for antineoplastic chemotherapy: Secondary | ICD-10-CM | POA: Diagnosis not present

## 2018-07-24 DIAGNOSIS — C7951 Secondary malignant neoplasm of bone: Secondary | ICD-10-CM

## 2018-07-24 MED ORDER — SODIUM CHLORIDE 0.9 % IV SOLN
INTRAVENOUS | Status: DC
  Administered 2018-07-24: 16:00:00 via INTRAVENOUS
  Filled 2018-07-24 (×2): qty 250

## 2018-07-24 NOTE — Progress Notes (Signed)
RD working remotely.  63 year old female diagnosed with breast cancer s/p radiation.  PMH includes HTN, HLD, GERD, COPD, Asthma.  Medications include Vitmain D3, Zofran, Protonix, Phenergan, Carafate.  Labs include Glucose 102, Creatinine 1.09  Height: 65 inches Weight: 178 pounds. Weight 195 pounds March 2020. BMI: 29.62.  Patient reports she is a little better today and has been able to swallow some liquids and soft foods. She still has pain and nausea. Reports heartburn, despite Protonix. Will drink ONS but says they are too thick.  Nutrition Diagnosis: Inadequate oral intake related to radiation for breast cancer as evidenced by difficult/painful swallowing and 17 pound weight loss over 2 months.  Intervention: Educated patient to thin oral nutrition supplements with milk, water, or ice. Recommend she drink BID. Suggested she request lidocaine for pain and easier swallowing. Educated patient on food tips to minimize heartburn. Sent an email with patient fact sheets and my name and contact information. Questions answered. Teach back method used.  Monitoring, Evaluation, Goals: Patient will tolerate increased calories and protein to minimize further weight loss.  Next Visit: Patient will contact me with questions.

## 2018-07-24 NOTE — Patient Instructions (Signed)
Rehydration, Adult Rehydration is the replacement of body fluids and salts and minerals (electrolytes) that are lost during dehydration. Dehydration is when there is not enough fluid or water in the body. This happens when you lose more fluids than you take in. Common causes of dehydration include:  Vomiting.  Diarrhea.  Excessive sweating, such as from heat exposure or exercise.  Taking medicines that cause the body to lose excess fluid (diuretics).  Impaired kidney function.  Not drinking enough fluid.  Certain illnesses or infections.  Certain poorly controlled long-term (chronic) illnesses, such as diabetes, heart disease, and kidney disease.  Symptoms of mild dehydration may include thirst, dry lips and mouth, dry skin, and dizziness. Symptoms of severe dehydration may include increased heart rate, confusion, fainting, and not urinating. You can rehydrate by drinking certain fluids or getting fluids through an IV tube, as told by your health care provider. What are the risks? Generally, rehydration is safe. However, one problem that can happen is taking in too much fluid (overhydration). This is rare. If overhydration happens, it can cause an electrolyte imbalance, kidney failure, or a decrease in salt (sodium) levels in the body. How to rehydrate Follow instructions from your health care provider for rehydration. The kind of fluid you should drink and the amount you should drink depend on your condition.  If directed by your health care provider, drink an oral rehydration solution (ORS). This is a drink designed to treat dehydration that is found in pharmacies and retail stores. ? Make an ORS by following instructions on the package. ? Start by drinking small amounts, about  cup (120 mL) every 5-10 minutes. ? Slowly increase how much you drink until you have taken the amount recommended by your health care provider.  Drink enough clear fluids to keep your urine clear or pale  yellow. If you were instructed to drink an ORS, finish the ORS first, then start slowly drinking other clear fluids. Drink fluids such as: ? Water. Do not drink only water. Doing that can lead to having too little sodium in your body (hyponatremia). ? Ice chips. ? Fruit juice that you have added water to (diluted juice). ? Low-calorie sports drinks.  If you are severely dehydrated, your health care provider may recommend that you receive fluids through an IV tube in the hospital.  Do not take sodium tablets. Doing that can lead to the condition of having too much sodium in your body (hypernatremia). Eating while you rehydrate Follow instructions from your health care provider about what to eat while you rehydrate. Your health care provider may recommend that you slowly begin eating regular foods in small amounts.  Eat foods that contain a healthy balance of electrolytes, such as bananas, oranges, potatoes, tomatoes, and spinach.  Avoid foods that are greasy or contain a lot of fat or sugar.  In some cases, you may get nutrition through a feeding tube that is passed through your nose and into your stomach (nasogastric tube, or NG tube). This may be done if you have uncontrolled vomiting or diarrhea. Beverages to avoid Certain beverages may make dehydration worse. While you rehydrate, avoid:  Alcohol.  Caffeine.  Drinks that contain a lot of sugar. These include: ? High-calorie sports drinks. ? Fruit juice that is not diluted. ? Soda.  Check nutrition labels to see how much sugar or caffeine a beverage contains. Signs of dehydration recovery You may be recovering from dehydration if:  You are urinating more often than before you started   rehydrating.  Your urine is clear or pale yellow.  Your energy level improves.  You vomit less frequently.  You have diarrhea less frequently.  Your appetite improves or returns to normal.  You feel less dizzy or less light-headed.  Your  skin tone and color start to look more normal. Contact a health care provider if:  You continue to have symptoms of mild dehydration, such as: ? Thirst. ? Dry lips. ? Slightly dry mouth. ? Dry, warm skin. ? Dizziness.  You continue to vomit or have diarrhea. Get help right away if:  You have symptoms of dehydration that get worse.  You feel: ? Confused. ? Weak. ? Like you are going to faint.  You have not urinated in 6-8 hours.  You have very dark urine.  You have trouble breathing.  Your heart rate while sitting still is over 100 beats a minute.  You cannot drink fluids without vomiting.  You have vomiting or diarrhea that: ? Gets worse. ? Does not go away.  You have a fever. This information is not intended to replace advice given to you by your health care provider. Make sure you discuss any questions you have with your health care provider. Document Released: 05/06/2011 Document Revised: 09/01/2015 Document Reviewed: 04/07/2015 Elsevier Interactive Patient Education  2019 [employer]  Coronavirus (COVID-19) Are you at risk?  Are you at risk for the Coronavirus (COVID-19)?  To be considered HIGH RISK for Coronavirus (COVID-19), you have to meet the following criteria:  . Traveled to [place], [place], [place], [place] or [place]; or in the [place] to [place], [place], [place], or [place]; and have fever, cough, and shortness of breath within the last 2 weeks of travel OR . Been in close contact with a person diagnosed with COVID-19 within the last 2 weeks and have fever, cough, and shortness of breath . IF YOU DO NOT MEET THESE CRITERIA, YOU ARE CONSIDERED LOW RISK FOR COVID-19.  What to do if you are HIGH RISK for COVID-19?  . If you are having a medical emergency, call 911. . Seek medical care right away. Before you go to a doctor's office, urgent care or emergency department, call ahead and tell them about your recent travel, contact with  someone diagnosed with COVID-19, and your symptoms. You should receive instructions from your physician's office regarding next steps of care.  . When you arrive at healthcare provider, tell the healthcare staff immediately you have returned from visiting [place], [place], [place], [place] or [place]; or traveled in the [place] to [place], [place], [place], or [place]; in the last two weeks or you have been in close contact with a person diagnosed with COVID-19 in the last 2 weeks.   . Tell the health care staff about your symptoms: fever, cough and shortness of breath. . After you have been seen by a medical provider, you will be either: o Tested for (COVID-19) and discharged home on quarantine except to seek medical care if symptoms worsen, and asked to  - Stay home and avoid contact with others until you get your results (4-5 days)  - Avoid travel on public transportation if possible (such as bus, train, or airplane) or o Sent to the Emergency Department by EMS for evaluation, COVID-19 testing, and possible admission depending on your condition and test results.  What to do if you are LOW RISK for COVID-19?  Reduce your risk of any infection by using the same   precautions used for avoiding the common cold or flu:  . Wash your hands often with soap and warm water for at least 20 seconds.  If soap and water are not readily available, use an alcohol-based hand sanitizer with at least 60% alcohol.  . If coughing or sneezing, cover your mouth and nose by coughing or sneezing into the elbow areas of your shirt or coat, into a tissue or into your sleeve (not your hands). . Avoid shaking hands with others and consider head nods or verbal greetings only. . Avoid touching your eyes, nose, or mouth with unwashed hands.  . Avoid close contact with people who are sick. . Avoid places or events with large numbers of people in one location, like concerts or sporting events. . Carefully consider  travel plans you have or are making. . If you are planning any travel outside or inside the [place], visit the CDC's Travelers' Health webpage for the latest health notices. . If you have some symptoms but not all symptoms, continue to monitor at home and seek medical attention if your symptoms worsen. . If you are having a medical emergency, call 911.   ADDITIONAL HEALTHCARE OPTIONS FOR PATIENTS  [facility] / e-Visit: [email or web address]         [facility]: [phone]  [facility]: [phone]                   [facility]: [phone]   

## 2018-07-25 ENCOUNTER — Other Ambulatory Visit: Payer: Self-pay

## 2018-07-25 ENCOUNTER — Inpatient Hospital Stay: Payer: 59

## 2018-07-25 VITALS — BP 144/59 | HR 76 | Temp 98.0°F | Resp 18

## 2018-07-25 DIAGNOSIS — C7951 Secondary malignant neoplasm of bone: Secondary | ICD-10-CM

## 2018-07-25 DIAGNOSIS — C50512 Malignant neoplasm of lower-outer quadrant of left female breast: Secondary | ICD-10-CM

## 2018-07-25 DIAGNOSIS — Z5111 Encounter for antineoplastic chemotherapy: Secondary | ICD-10-CM | POA: Diagnosis not present

## 2018-07-25 MED ORDER — ONDANSETRON HCL 4 MG/2ML IJ SOLN
INTRAMUSCULAR | Status: AC
  Filled 2018-07-25: qty 4

## 2018-07-25 MED ORDER — SODIUM CHLORIDE 0.9 % IV SOLN
INTRAVENOUS | Status: DC
  Administered 2018-07-25: 10:00:00 via INTRAVENOUS
  Filled 2018-07-25 (×2): qty 250

## 2018-07-25 NOTE — Patient Instructions (Signed)
Dehydration, Adult  Dehydration is a condition in which there is not enough fluid or water in the body. This happens when you lose more fluids than you take in. Important organs, such as the kidneys, brain, and heart, cannot function without a proper amount of fluids. Any loss of fluids from the body can lead to dehydration. Dehydration can range from mild to severe. This condition should be treated right away to prevent it from becoming severe. What are the causes? This condition may be caused by:  Vomiting.  Diarrhea.  Excessive sweating, such as from heat exposure or exercise.  Not drinking enough fluid, especially: ? When ill. ? While doing activity that requires a lot of energy.  Excessive urination.  Fever.  Infection.  Certain medicines, such as medicines that cause the body to lose excess fluid (diuretics).  Inability to access safe drinking water.  Reduced physical ability to get adequate water and food. What increases the risk? This condition is more likely to develop in people:  Who have a poorly controlled long-term (chronic) illness, such as diabetes, heart disease, or kidney disease.  Who are age 65 or older.  Who are disabled.  Who live in a place with high altitude.  Who play endurance sports. What are the signs or symptoms? Symptoms of mild dehydration may include:  Thirst.  Dry lips.  Slightly dry mouth.  Dry, warm skin.  Dizziness. Symptoms of moderate dehydration may include:  Very dry mouth.  Muscle cramps.  Dark urine. Urine may be the color of tea.  Decreased urine production.  Decreased tear production.  Heartbeat that is irregular or faster than normal (palpitations).  Headache.  Light-headedness, especially when you stand up from a sitting position.  Fainting (syncope). Symptoms of severe dehydration may include:  Changes in skin, such as: ? Cold and clammy skin. ? Blotchy (mottled) or pale skin. ? Skin that does  not quickly return to normal after being lightly pinched and released (poor skin turgor).  Changes in body fluids, such as: ? Extreme thirst. ? No tear production. ? Inability to sweat when body temperature is high, such as in hot weather. ? Very little urine production.  Changes in vital signs, such as: ? Weak pulse. ? Pulse that is more than 100 beats a minute when sitting still. ? Rapid breathing. ? Low blood pressure.  Other changes, such as: ? Sunken eyes. ? Cold hands and feet. ? Confusion. ? Lack of energy (lethargy). ? Difficulty waking up from sleep. ? Short-term weight loss. ? Unconsciousness. How is this diagnosed? This condition is diagnosed based on your symptoms and a physical exam. Blood and urine tests may be done to help confirm the diagnosis. How is this treated? Treatment for this condition depends on the severity. Mild or moderate dehydration can often be treated at home. Treatment should be started right away. Do not wait until dehydration becomes severe. Severe dehydration is an emergency and it needs to be treated in a hospital. Treatment for mild dehydration may include:  Drinking more fluids.  Replacing salts and minerals in your blood (electrolytes) that you may have lost. Treatment for moderate dehydration may include:  Drinking an oral rehydration solution (ORS). This is a drink that helps you replace fluids and electrolytes (rehydrate). It can be found at pharmacies and retail stores. Treatment for severe dehydration may include:  Receiving fluids through an IV tube.  Receiving an electrolyte solution through a feeding tube that is passed through your nose and   into your stomach (nasogastric tube, or NG tube).  Correcting any abnormalities in electrolytes.  Treating the underlying cause of dehydration. Follow these instructions at home:  If directed by your health care provider, drink an ORS: ? Make an ORS by following instructions on the  package. ? Start by drinking small amounts, about  cup (120 mL) every 5-10 minutes. ? Slowly increase how much you drink until you have taken the amount recommended by your health care provider.  Drink enough clear fluid to keep your urine clear or pale yellow. If you were told to drink an ORS, finish the ORS first, then start slowly drinking other clear fluids. Drink fluids such as: ? Water. Do not drink only water. Doing that can lead to having too little salt (sodium) in the body (hyponatremia). ? Ice chips. ? Fruit juice that you have added water to (diluted fruit juice). ? Low-calorie sports drinks.  Avoid: ? Alcohol. ? Drinks that contain a lot of sugar. These include high-calorie sports drinks, fruit juice that is not diluted, and soda. ? Caffeine. ? Foods that are greasy or contain a lot of fat or sugar.  Take over-the-counter and prescription medicines only as told by your health care provider.  Do not take sodium tablets. This can lead to having too much sodium in the body (hypernatremia).  Eat foods that contain a healthy balance of electrolytes, such as bananas, oranges, potatoes, tomatoes, and spinach.  Keep all follow-up visits as told by your health care provider. This is important. Contact a health care provider if:  You have abdominal pain that: ? Gets worse. ? Stays in one area (localizes).  You have a rash.  You have a stiff neck.  You are more irritable than usual.  You are sleepier or more difficult to wake up than usual.  You feel weak or dizzy.  You feel very thirsty.  You have urinated only a small amount of very dark urine over 6-8 hours. Get help right away if:  You have symptoms of severe dehydration.  You cannot drink fluids without vomiting.  Your symptoms get worse with treatment.  You have a fever.  You have a severe headache.  You have vomiting or diarrhea that: ? Gets worse. ? Does not go away.  You have blood or green matter  (bile) in your vomit.  You have blood in your stool. This may cause stool to look black and tarry.  You have not urinated in 6-8 hours.  You faint.  Your heart rate while sitting still is over 100 beats a minute.  You have trouble breathing. This information is not intended to replace advice given to you by your health care provider. Make sure you discuss any questions you have with your health care provider. Document Released: 02/11/2005 Document Revised: 09/08/2015 Document Reviewed: 04/07/2015 Elsevier Interactive Patient Education  2019 [employer]  

## 2018-07-30 NOTE — Progress Notes (Signed)
[place]  [phone] [phone]    ID: Caitlin Wall DOB: 1955-09-24  MR#: [id number]  EXN#:[id number]  Patient Care Team: Biagio Borg, MD as PCP - General Tamala Julian Lynnell Dike, MD as PCP - Cardiology (Cardiology) Brittni Hult, Virgie Dad, MD as Consulting Physician (Oncology) Kyung Rudd, MD as Consulting Physician (Radiation Oncology) Jovita Kussmaul, MD as Consulting Physician (General Surgery) Sylvan Cheese, NP as Nurse Practitioner (Hematology and Oncology) OTHER MD: Keturah Barre MD   CHIEF COMPLAINT: Metastatic estrogen receptor positive breast cancer  CURRENT TREATMENT: Denosumab/Xgeva, fluvestrant, palbociclib   INTERVAL HISTORY: Caitlin Wall returns today for follow-up and treatment of her metastatic estrogen receptor positive breast cancer.   The last week of her Leslee Home was held due to her esophagitis. She is currently in her off week and is set to resume it on the 15th of June 2020.   She continues on denosumab, with her most recent dose received on 07/21/2018. She tolerates this well and without any noticeable side effects.    She also continues on fulvestrant, with her most recent dose received on 07/21/2018. She tolerates this well and without any noticeable side effects.    [name] completed her radiation treatments 07/14/2018.  She has significant esophagitis, which took her to the emergency room on 07/20/2018.  We brought her in the next day for fluids and she was also seen on 07/23/2018.  At this point she notes that it still hurts all the way down her throat. She starts to cough, which makes her gag, so she hasn't been able to eat. She has been able to eat watermelon and soggy bread. She is taking ibuprofen and liquid morphine for pain. She has not had any issues with constipation.      REVIEW OF SYSTEMS: Caitlin Wall has not resumed any exercise yet, because she still feels rather tired. The patient denies unusual headaches, visual  changes, nausea, vomiting, or dizziness. There has been no unusual cough, phlegm production, or pleurisy. This been no change in bowel or bladder habits. The patient denies unexplained weight loss, bleeding, or fever. A detailed review of systems was otherwise noncontributory.    BREAST CANCER HISTORY:  From the original intake note:   Caitlin Wall [facility]. She has subpectoral saline implants in place. There was a possible asymmetry in the left breast and she was recalled for left diagnostic mammography with tomosynthesis and left breast ultrasonography 11/03/2014. The breast density was category C. In the left breast lower outer quadrant there was an irregular mass which was not palpable and which by ultrasonography measured 0.6 cm. The left axilla was sonographically benign. Biopsy of this mass 11/09/2014 showed ([id number]) fibroadipose adipose tissue. This was felt to be discordant.  Accordingly the patient was referred to surgery and she underwent left breast radioactive seed localized lumpectomy 12/28/2014. The pathology from that procedure (([id number]) showed ductal carcinoma in situ, high-grade, measuring 0.2 cm. This was less than 0.1 cm from the posterior margin. The cells was estrogen receptor positive at 95%, with strong staining intensity, progesterone receptor positive at 30% with moderate staining intensity.  Her subsequent history is as detailed below. PAST MEDICAL HISTORY: Past Medical History:  Diagnosis Date   Asthma, persistent not controlled 07/27/2012   Breast cancer ([place]) 12/28/14   Left BresastDCIS   COLONIC POLYPS, HX OF 11/04/2007   COPD (chronic obstructive pulmonary disease) ([place])    GERD 11/04/2007   GERD (  gastroesophageal reflux disease)    HAIR LOSS 11/04/2007   Hx of cardiac cath 11/15/2016   a. LHC 11/15/16 showed normal coronaries and EF 50-55% with normal EDP. (done for false positive NST)   Hx of  colonic polyps 1995   adenomatous   Hyperlipidemia    HYPERLIPIDEMIA 11/04/2007   Hypertension    HYPERTENSION 06/07/2009   LBBB (left bundle branch block) 07/26/2011   Personal history of radiation therapy    S/P radiation therapy 02/02/15-03/24/15   left breast 60.4GY    PAST SURGICAL HISTORY: Past Surgical History:  Procedure Laterality Date   ABDOMINAL HYSTERECTOMY     ANTERIOR FUSION CERVICAL SPINE     AUGMENTATION MAMMAPLASTY Bilateral    bilat ear surgury/hearing loss     BREAST LUMPECTOMY Left 2016   BREAST LUMPECTOMY WITH RADIOACTIVE SEED LOCALIZATION Left 12/28/2014   Procedure: BREAST LUMPECTOMY WITH RADIOACTIVE SEED LOCALIZATION;  Surgeon: Autumn Messing III, MD;  Location: [facility];  Service: General;  Laterality: Left;   COLONOSCOPY     LEFT HEART CATH AND CORONARY ANGIOGRAPHY N/A 11/15/2016   Procedure: LEFT HEART CATH AND CORONARY ANGIOGRAPHY;  Surgeon: Belva Crome, MD;  Location: [facility];  Service: Cardiovascular;  Laterality: N/A;   POLYPECTOMY      FAMILY HISTORY: Family History  Problem Relation Age of Onset   Breast cancer Other    Cancer Paternal Grandmother        breast   Lung cancer Father    Breast cancer Maternal Grandmother    Healthy Mother    Coronary artery disease Neg Hx    Colon cancer Neg Hx    Pancreatic cancer Neg Hx    Rectal cancer Neg Hx    Stomach cancer Neg Hx    Esophageal cancer Neg Hx    [name] died from lung cancer in the setting of tobacco abuse at age 15. [name] died from "old age" at age 68. The patient had one brother, 3 sisters. The brother had" I cancer" requiring enucleation--the patient does not know whether this was a primary ocular melanoma. [name] was diagnosed with breast cancer but the patient does not know at what age. There is no other history of breast or ovarian cancer in the family   GYNECOLOGIC HISTORY:  No  LMP recorded. Patient has had a hysterectomy. Menarche age 15, first live birth age 37. The patient is GX P3. She is status post hysterectomy without salpingo-oophorectomy. She did not take hormone replacement. She did use oral contraceptives remotely without any complications.   SOCIAL HISTORY:  Mickaela works in accounts payable. Her husband [name] is disabled because of back problems. [name] is a Marine scientist working in rehabilitation in [place]. [name] lives in [place] where he works as a Games developer. [name] lives in [place] where he works as a Emergency planning/management officer. The patient has 4 grandchildren. She is not a Ambulance person.    ADVANCED DIRECTIVES: Not in place   HEALTH MAINTENANCE: Social History   Tobacco Use   Smoking status: Former Smoker    Packs/day: 1.00    Years: 20.00    Pack years: 20.00    Types: Cigarettes    Last attempt to quit: 08/19/1989    Years since quitting: 28.9   Smokeless tobacco: Never Used  Substance Use Topics   Alcohol use: No   Drug use: No    Colonoscopy:  PAP:  Bone density:  Lipid panel:  Allergies  Allergen Reactions   Codeine     REACTION: nausea,   Tramadol Hcl Nausea Only    Current Outpatient Medications  Medication Sig Dispense Refill   alum & mag hydroxide-simeth (MAALOX PLUS) 400-400-40 MG/5ML suspension Take 15 mLs by mouth every 6 (six) hours as needed for indigestion. 355 mL 0   amLODipine (NORVASC) 2.5 MG tablet Take 1 tablet (2.5 mg total) by mouth daily. 90 tablet 3   aspirin EC 81 MG tablet Take 81 mg by mouth daily.     cholecalciferol (VITAMIN D3) 25 MCG (1000 UT) tablet Take 1 tablet (1,000 Units total) by mouth daily.     Ipratropium-Albuterol (COMBIVENT) 20-100 MCG/ACT AERS respimat Inhale 1 puff into the lungs every 6 (six) hours. 1 Inhaler 0   losartan (COZAAR) 100 MG tablet Take 1 tablet (100 mg total) by mouth daily. 90 tablet 3   morphine 10 MG/5ML solution Take 1.3-2.5 mLs  (2.6-5 mg total) by mouth every 6 (six) hours as needed for severe pain. 30 mL 0   ondansetron (ZOFRAN) 8 MG tablet Take 1 tablet (8 mg total) by mouth every 8 (eight) hours as needed for nausea or vomiting. 20 tablet 0   palbociclib (IBRANCE) 125 MG capsule Take 1 capsule (125 mg total) by mouth daily. Take after lunch. Take for 21 days on, 7 days off, repeat every 28 days. 21 capsule 6   pantoprazole (PROTONIX) 20 MG tablet Take 1 tablet (20 mg total) by mouth daily. 30 tablet 0   promethazine (PHENERGAN) 25 MG suppository Place 1 suppository (25 mg total) rectally every 6 (six) hours as needed for nausea or vomiting. 12 each 0   Spacer/Aero-Holding Chambers DEVI Spacer to be used with inhaler 1 each 0   sucralfate (CARAFATE) 1 g tablet Take 1 tablet (1 g total) by mouth 4 (four) times daily. 120 tablet 1   valACYclovir (VALTREX) 500 MG tablet Take 1 tablet (500 mg total) by mouth 2 (two) times daily. 60 tablet 1   No current facility-administered medications for this visit.     OBJECTIVE: Middle-aged white woman who appears stated age  79:   07/31/18 0902  BP: (!) 96/59  Pulse: 87  Resp: 17  Temp: 98 F (36.7 C)  SpO2: 100%     Body mass index is 29.1 kg/m.    ECOG FS:1 - Symptomatic but completely ambulatory  Filed Weights   07/31/18 0902  Weight: 174 lb 14.4 oz (79.3 kg)   Sclerae unicteric, EOMs intact I do not see evidence of thrush or other oral lesions No cervical or supraclavicular adenopathy Lungs no rales or rhonchi Heart regular rate and rhythm Abd soft, nontender, positive bowel sounds MSK no focal spinal tenderness, no upper extremity lymphedema Neuro: nonfocal, well oriented, appropriate affect Breasts: Deferred    LAB RESULTS:  CMP     Component Value Date/Time   NA 140 07/23/2018 1339   NA 141 04/25/2017 1213   NA 145 07/04/2016 1133   K 3.5 07/23/2018 1339   K 4.2 07/04/2016 1133   CL 106 07/23/2018 1339   CO2 26 07/23/2018 1339   CO2  28 07/04/2016 1133   GLUCOSE 102 (H) 07/23/2018 1339   GLUCOSE 101 07/04/2016 1133   BUN 10 07/23/2018 1339   BUN 18 04/25/2017 1213   BUN 16.0 07/04/2016 1133   CREATININE 1.09 (H) 07/23/2018 1339   CREATININE 0.9 07/04/2016 1133   CALCIUM 8.4 (L) 07/23/2018 1339  CALCIUM 9.9 07/04/2016 1133   PROT 6.8 07/23/2018 1339   PROT 7.3 07/04/2016 1133   ALBUMIN 3.5 07/23/2018 1339   ALBUMIN 3.8 07/04/2016 1133   AST 35 07/23/2018 1339   AST 19 07/04/2016 1133   ALT 25 07/23/2018 1339   ALT 17 07/04/2016 1133   ALKPHOS 76 07/23/2018 1339   ALKPHOS 172 (H) 07/04/2016 1133   BILITOT 1.1 07/23/2018 1339   BILITOT 0.94 07/04/2016 1133   GFRNONAA 55 (L) 07/23/2018 1339   GFRAA >60 07/23/2018 1339    INo results found for: SPEP, UPEP  Lab Results  Component Value Date   WBC 3.4 (L) 07/31/2018   NEUTROABS 1.9 07/31/2018   HGB 12.5 07/31/2018   HCT 36.4 07/31/2018   MCV 107.7 (H) 07/31/2018   PLT 169 07/31/2018      Chemistry      Component Value Date/Time   NA 140 07/23/2018 1339   NA 141 04/25/2017 1213   NA 145 07/04/2016 1133   K 3.5 07/23/2018 1339   K 4.2 07/04/2016 1133   CL 106 07/23/2018 1339   CO2 26 07/23/2018 1339   CO2 28 07/04/2016 1133   BUN 10 07/23/2018 1339   BUN 18 04/25/2017 1213   BUN 16.0 07/04/2016 1133   CREATININE 1.09 (H) 07/23/2018 1339   CREATININE 0.9 07/04/2016 1133      Component Value Date/Time   CALCIUM 8.4 (L) 07/23/2018 1339   CALCIUM 9.9 07/04/2016 1133   ALKPHOS 76 07/23/2018 1339   ALKPHOS 172 (H) 07/04/2016 1133   AST 35 07/23/2018 1339   AST 19 07/04/2016 1133   ALT 25 07/23/2018 1339   ALT 17 07/04/2016 1133   BILITOT 1.1 07/23/2018 1339   BILITOT 0.94 07/04/2016 1133     No results found for: LABCA2  No components found for: LABCA125  No results for input(s): INR in the last 168 hours.  Urinalysis    Component Value Date/Time   COLORURINE YELLOW 07/20/2018 [place] 07/20/2018 1747   LABSPEC  1.019 07/20/2018 1747   PHURINE 5.0 07/20/2018 1747   GLUCOSEU NEGATIVE 07/20/2018 1747   GLUCOSEU NEGATIVE 10/20/2017 1303   HGBUR NEGATIVE 07/20/2018 1747   BILIRUBINUR NEGATIVE 07/20/2018 1747   KETONESUR 20 (A) 07/20/2018 1747   PROTEINUR NEGATIVE 07/20/2018 1747   UROBILINOGEN 1.0 10/20/2017 1303   NITRITE NEGATIVE 07/20/2018 1747   LEUKOCYTESUR NEGATIVE 07/20/2018 1747    STUDIES: No results found.   ASSESSMENT: 63 y.o. [place] woman status post left lumpectomy 12/28/2014 for ductal carcinoma in situ, high-grade measuring 0.2 cm, estrogen and progesterone receptor positive, with close but negative margins  (1) adjuvant radiation completed 03/24/2015  (2) anastrozole started 04/19/2015   (a) bone density 03/01/2016 shows a T score of -1.2  (3) mildly abnormal hepatic function panel: negative workup for hepatitis B and C, normal alpha-1-anti-trypsin  (4) left lower extremity DVT diagnosed by Doppler ultrasonography 04/16/2016, with negative CT angiogram chest  (a) on Xarelto starting 04/16/2016   (b) negative hypercoagulable panel, with a minimally abnormal (indeterminate) anti-cardiolipin IgM  ((c) stopped rivaroxaban 07/16/2016, repeat D-Dimer WNL August 2018  METASTATIC DISEASE: December 2019: bone only (5) CT chest 02/06/2018 shows a mixed lytic and sclerotic process with fracture at the inferior manubrium but no other bone lesions.  However bone scan 02/16/2018 shows multiple bone lesions consistent with metastatic disease; no lung or pleura involvement, no adenopathy  (a) Bone marrow biopsy on 03/03/2018 shows metastatic carcinoma consistent with breast  primary, estrogen and progesterone receptor strongly positive, HER-2 not amplified (1+)  (b) MRI abdomen and MRI brain at [facility] (results viewable in "care everywhere") notes known bone involvement in spine and pelvis, but no other area of metastatic disease in abdomen/pelvis, or brain.  (c) CA 27.29 not  informative (37.9 on 03/03/2018)  (6) started denosumab/Xgeva on 03/18/2018, repeated Q28 d  (7) started fulvestrant on 03/03/2018 and palbociclib 03/18/2018 at 125 mg/d 21/7  (a) bone scan 06/10/2018 serves as our new baseline  (8) palliative radiation completed 07/14/2018  (a) significant esophagitis   PLAN: Scottlyn continues to have esophagitis problems.  She is generally able to hydrate herself fairly well although I think she will benefit from fluids today and that is being arranged.  She is mostly taking liquids, using Carafate, and using pain medication including liquid morphine at night.  My expectation is that was in the next 5 to 10 days the esophagitis will have sufficiently resolved that she will be able to eat solids normally.  She weighed 196 pounds early March and weighs 175 pounds currently.  Aside from the radiation esophagitis issue, she has done well with her treatments and we are going to continue the denosumab/Xgeva, and fulvestrant, with the next dose scheduled for 08/18/2018.  She will resume her palbociclib 125 mg daily starting on 08/10/2018 assuming her esophagitis allows her to swallow [email or web address]  The plan is to restage her sometime in August or September.  She will have a bone scan and chest CT at that time  She knows to call for any other issues that may develop before the next visit.     Sekai Gitlin, Virgie Dad, MD  07/31/18 9:29 AM Medical Oncology and Hematology [facility] [address] [place], [place] [address] Tel. [phone]    Fax. [phone]  I, Jacqualyn Posey am acting as a Education administrator for Chauncey Cruel, MD.   I, Lurline Del MD, have reviewed the above documentation for accuracy and completeness, and I agree with the above.

## 2018-07-31 ENCOUNTER — Other Ambulatory Visit: Payer: Self-pay | Admitting: *Deleted

## 2018-07-31 ENCOUNTER — Inpatient Hospital Stay: Payer: 59 | Attending: Oncology | Admitting: Oncology

## 2018-07-31 ENCOUNTER — Inpatient Hospital Stay: Payer: 59

## 2018-07-31 ENCOUNTER — Other Ambulatory Visit: Payer: Self-pay

## 2018-07-31 VITALS — BP 132/60 | HR 70

## 2018-07-31 VITALS — BP 96/59 | HR 87 | Temp 98.0°F | Resp 17 | Ht 65.0 in | Wt 174.9 lb

## 2018-07-31 DIAGNOSIS — C50512 Malignant neoplasm of lower-outer quadrant of left female breast: Secondary | ICD-10-CM

## 2018-07-31 DIAGNOSIS — I700 Atherosclerosis of aorta: Secondary | ICD-10-CM | POA: Insufficient documentation

## 2018-07-31 DIAGNOSIS — Z803 Family history of malignant neoplasm of breast: Secondary | ICD-10-CM | POA: Diagnosis not present

## 2018-07-31 DIAGNOSIS — Z923 Personal history of irradiation: Secondary | ICD-10-CM | POA: Insufficient documentation

## 2018-07-31 DIAGNOSIS — Z7901 Long term (current) use of anticoagulants: Secondary | ICD-10-CM | POA: Diagnosis not present

## 2018-07-31 DIAGNOSIS — J449 Chronic obstructive pulmonary disease, unspecified: Secondary | ICD-10-CM | POA: Diagnosis not present

## 2018-07-31 DIAGNOSIS — Z7982 Long term (current) use of aspirin: Secondary | ICD-10-CM | POA: Diagnosis not present

## 2018-07-31 DIAGNOSIS — Z87891 Personal history of nicotine dependence: Secondary | ICD-10-CM | POA: Insufficient documentation

## 2018-07-31 DIAGNOSIS — E785 Hyperlipidemia, unspecified: Secondary | ICD-10-CM

## 2018-07-31 DIAGNOSIS — Z86718 Personal history of other venous thrombosis and embolism: Secondary | ICD-10-CM | POA: Insufficient documentation

## 2018-07-31 DIAGNOSIS — C7951 Secondary malignant neoplasm of bone: Secondary | ICD-10-CM

## 2018-07-31 DIAGNOSIS — Z90710 Acquired absence of both cervix and uterus: Secondary | ICD-10-CM

## 2018-07-31 DIAGNOSIS — K208 Other esophagitis without bleeding: Secondary | ICD-10-CM

## 2018-07-31 DIAGNOSIS — Z170 Estrogen receptor positive status [ER+]: Secondary | ICD-10-CM | POA: Diagnosis not present

## 2018-07-31 DIAGNOSIS — G893 Neoplasm related pain (acute) (chronic): Secondary | ICD-10-CM | POA: Diagnosis not present

## 2018-07-31 DIAGNOSIS — I10 Essential (primary) hypertension: Secondary | ICD-10-CM

## 2018-07-31 DIAGNOSIS — Z79899 Other long term (current) drug therapy: Secondary | ICD-10-CM | POA: Insufficient documentation

## 2018-07-31 DIAGNOSIS — I824Z2 Acute embolism and thrombosis of unspecified deep veins of left distal lower extremity: Secondary | ICD-10-CM

## 2018-07-31 DIAGNOSIS — Z5111 Encounter for antineoplastic chemotherapy: Secondary | ICD-10-CM | POA: Insufficient documentation

## 2018-07-31 LAB — COMPREHENSIVE METABOLIC PANEL
ALT: 23 U/L (ref 0–44)
AST: 30 U/L (ref 15–41)
Albumin: 3.6 g/dL (ref 3.5–5.0)
Alkaline Phosphatase: 75 U/L (ref 38–126)
Anion gap: 10 (ref 5–15)
BUN: 17 mg/dL (ref 8–23)
CO2: 28 mmol/L (ref 22–32)
Calcium: 9.8 mg/dL (ref 8.9–10.3)
Chloride: 103 mmol/L (ref 98–111)
Creatinine, Ser: 2.32 mg/dL — ABNORMAL HIGH (ref 0.44–1.00)
GFR calc Af Amer: 25 mL/min — ABNORMAL LOW (ref >60)
GFR calc non Af Amer: 22 mL/min — ABNORMAL LOW (ref >60)
Glucose, Bld: 91 mg/dL (ref 70–99)
Potassium: 3.9 mmol/L (ref 3.5–5.1)
Sodium: 141 mmol/L (ref 135–145)
Total Bilirubin: 1.2 mg/dL (ref 0.3–1.2)
Total Protein: 6.8 g/dL (ref 6.5–8.1)

## 2018-07-31 LAB — CBC WITH DIFFERENTIAL/PLATELET
Abs Immature Granulocytes: 0.01 10*3/uL (ref 0.00–0.07)
Basophils Absolute: 0.1 10*3/uL (ref 0.0–0.1)
Basophils Relative: 2 %
Eosinophils Absolute: 0.3 10*3/uL (ref 0.0–0.5)
Eosinophils Relative: 8 %
HCT: 36.4 % (ref 36.0–46.0)
Hemoglobin: 12.5 g/dL (ref 12.0–15.0)
Immature Granulocytes: 0 %
Lymphocytes Relative: 13 %
Lymphs Abs: 0.4 10*3/uL — ABNORMAL LOW (ref 0.7–4.0)
MCH: 37 pg — ABNORMAL HIGH (ref 26.0–34.0)
MCHC: 34.3 g/dL (ref 30.0–36.0)
MCV: 107.7 fL — ABNORMAL HIGH (ref 80.0–100.0)
Monocytes Absolute: 0.7 10*3/uL (ref 0.1–1.0)
Monocytes Relative: 21 %
Neutro Abs: 1.9 10*3/uL (ref 1.7–7.7)
Neutrophils Relative %: 56 %
Platelets: 169 10*3/uL (ref 150–400)
RBC: 3.38 MIL/uL — ABNORMAL LOW (ref 3.87–5.11)
RDW: 15.5 % (ref 11.5–15.5)
WBC: 3.4 10*3/uL — ABNORMAL LOW (ref 4.0–10.5)
nRBC: 0 % (ref 0.0–0.2)

## 2018-07-31 MED ORDER — ALUM & MAG HYDROXIDE-SIMETH 400-400-40 MG/5ML PO SUSP: 15.0000 mL | Freq: Four times a day (QID) | ORAL | 0 refills | Status: DC | PRN

## 2018-07-31 MED ORDER — PROMETHAZINE HCL 25 MG RE SUPP: 25.0000 mg | Freq: Four times a day (QID) | RECTAL | 0 refills | Status: DC | PRN

## 2018-07-31 MED ORDER — PROCHLORPERAZINE 25 MG RE SUPP: 25.0000 mg | Freq: Two times a day (BID) | RECTAL | 1 refills | Status: DC | PRN

## 2018-07-31 MED ORDER — SODIUM CHLORIDE 0.9 % IV SOLN
INTRAVENOUS | Status: DC
  Administered 2018-07-31: 10:00:00 via INTRAVENOUS
  Filled 2018-07-31 (×2): qty 250

## 2018-07-31 MED ORDER — MORPHINE SULFATE 10 MG/5ML PO SOLN: 2.5000 mg | Freq: Four times a day (QID) | ORAL | 0 refills | Status: DC | PRN

## 2018-07-31 NOTE — Patient Instructions (Signed)

## 2018-07-31 NOTE — Progress Notes (Signed)
Ok per MD Magrinat to increase pt's IVF rate to 750 ml/hr.  Pt received 1L IVF NS today, tolerated well.  Reports feeling better at end of tx.  VSS.

## 2018-08-17 NOTE — Progress Notes (Signed)
[place]  [phone] [phone]    ID: Caitlin Wall DOB: 11/25/1955  MR#: [id number]  NMM#:[id number]  Patient Care Team: Biagio Borg, MD as PCP - General Tamala Julian Lynnell Dike, MD as PCP - Cardiology (Cardiology) Magrinat, Virgie Dad, MD as Consulting Physician (Oncology) Kyung Rudd, MD as Consulting Physician (Radiation Oncology) Jovita Kussmaul, MD as Consulting Physician (General Surgery) Sylvan Cheese, NP as Nurse Practitioner (Hematology and Oncology) OTHER MD: Keturah Barre MD   CHIEF COMPLAINT: Metastatic estrogen receptor positive breast cancer  CURRENT TREATMENT: Denosumab/Xgeva, fluvestrant, palbociclib   INTERVAL HISTORY: Caitlin Wall returns today for follow-up and treatment of her metastatic estrogen receptor positive breast cancer.   She continues on denosumab/Xgeva, with her most recent dose received on 07/21/2018. She tolerates this well and without any noticeable side effects. She does not take extra calcium the day of treatment.   She also continues on fulvestrant, with her most recent dose received on 07/21/2018.Marland Kitchen She occasionally gets hot flashes.    She also continues on palbociclib/Ibrance. She tolerates this well and without any noticeable side effects.     She completed palliative radiation 05/19//2020. Her essophogitus is improved .She can now swallow solid foods, including meat. Her weight is also improving.   Since her last visit here, she has not undergone any additional studies.      REVIEW OF SYSTEMS: Caitlin Wall has been swimming at her family's spot at the [place]. She notes that her back and her ribs still hurt some; she continues to take ibuprofen and morphine for the pain. She has some constipation, which she treats with maylox. 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 For activity, she is up and down at work, but she does not have a formal exercise routine.  The patient denies  unusual headaches, visual changes, nausea, vomiting, or dizziness. There has been no unusual cough, phlegm production, or pleurisy. This been no change in bowel or bladder habits. The patient denies unexplained fatigue or unexplained weight loss, bleeding, rash, or fever. A detailed review of systems was otherwise noncontributory.    BREAST CANCER HISTORY:  From the original intake note:   Caitlin Wall [facility]. She has subpectoral saline implants in place. There was a possible asymmetry in the left breast and she was recalled for left diagnostic mammography with tomosynthesis and left breast ultrasonography 11/03/2014. The breast density was category C. In the left breast lower outer quadrant there was an irregular mass which was not palpable and which by ultrasonography measured 0.6 cm. The left axilla was sonographically benign. Biopsy of this mass 11/09/2014 showed ([id number]) fibroadipose adipose tissue. This was felt to be discordant.  Accordingly the patient was referred to surgery and she underwent left breast radioactive seed localized lumpectomy 12/28/2014. The pathology from that procedure (([id number] showed ductal carcinoma in situ, high-grade, measuring 0.2 cm. This was less than 0.1 cm from the posterior margin. The cells was estrogen receptor positive at 95%, with strong staining intensity, progesterone receptor positive at 30% with moderate staining intensity.  Her subsequent history is as detailed below. PAST MEDICAL HISTORY: Past Medical History:  Diagnosis Date  . Asthma, persistent not controlled 07/27/2012  . Breast cancer ([place]) 12/28/14   Left BresastDCIS  . COLONIC POLYPS, HX OF 11/04/2007  . COPD (chronic obstructive pulmonary disease) ([place])   . GERD 11/04/2007  . GERD (gastroesophageal reflux disease)   .  HAIR LOSS 11/04/2007  . Hx of cardiac cath 11/15/2016   a. LHC 11/15/16 showed normal coronaries and EF 50-55% with  normal EDP. (done for false positive NST)  . Hx of colonic polyps 1995   adenomatous  . Hyperlipidemia   . HYPERLIPIDEMIA 11/04/2007  . Hypertension   . HYPERTENSION 06/07/2009  . LBBB (left bundle branch block) 07/26/2011  . Personal history of radiation therapy   . S/P radiation therapy 02/02/15-03/24/15   left breast 60.4GY    PAST SURGICAL HISTORY: Past Surgical History:  Procedure Laterality Date  . ABDOMINAL HYSTERECTOMY    . ANTERIOR FUSION CERVICAL SPINE    . AUGMENTATION MAMMAPLASTY Bilateral   . bilat ear surgury/hearing loss    . BREAST LUMPECTOMY Left 2016  . BREAST LUMPECTOMY WITH RADIOACTIVE SEED LOCALIZATION Left 12/28/2014   Procedure: BREAST LUMPECTOMY WITH RADIOACTIVE SEED LOCALIZATION;  Surgeon: Autumn Messing III, MD;  Location: [facility];  Service: General;  Laterality: Left;  . COLONOSCOPY    . LEFT HEART CATH AND CORONARY ANGIOGRAPHY N/A 11/15/2016   Procedure: LEFT HEART CATH AND CORONARY ANGIOGRAPHY;  Surgeon: Belva Crome, MD;  Location: [facility];  Service: Cardiovascular;  Laterality: N/A;  . POLYPECTOMY      FAMILY HISTORY: Family History  Problem Relation Age of Onset  . Breast cancer Other   . Cancer Paternal Grandmother        breast  . Lung cancer Father   . Breast cancer Maternal Grandmother   . Healthy Mother   . Coronary artery disease Neg Hx   . Colon cancer Neg Hx   . Pancreatic cancer Neg Hx   . Rectal cancer Neg Hx   . Stomach cancer Neg Hx   . Esophageal cancer Neg Hx    [name] died from lung cancer in the setting of tobacco abuse at age 45. [name] died from "old age" at age 77. The patient had one brother, 3 sisters. The brother had" I cancer" requiring enucleation--the patient does not know whether this was a primary ocular melanoma. [name] was diagnosed with breast cancer but the patient does not know at what age. There is no other history of breast or ovarian  cancer in the family   GYNECOLOGIC HISTORY:  No LMP recorded. Patient has had a hysterectomy. Menarche age 21, first live birth age 55. The patient is GX P3. She is status post hysterectomy without salpingo-oophorectomy. She did not take hormone replacement. She did use oral contraceptives remotely without any complications.   SOCIAL HISTORY:  Caitlin Wall works in accounts payable. Her husband Caitlin Wall is disabled because of back problems. Daughter Caitlin Wall is a Marine scientist working in rehabilitation in [place]. Son Caitlin Wall lives in [place] where he works as a Games developer. Son Caitlin Wall lives in [place] where he works as a Emergency planning/management officer. The patient has 4 grandchildren. She is not a Ambulance person.    ADVANCED DIRECTIVES: Not in place   HEALTH MAINTENANCE: Social History   Tobacco Use  . Smoking status: Former Smoker    Packs/day: 1.00    Years: 20.00    Pack years: 20.00    Types: Cigarettes    Quit date: 08/19/1989    Years since quitting: 29.0  . Smokeless tobacco: Never Used  Substance Use Topics  . Alcohol use: No  . Drug use: No    Colonoscopy:  PAP:  Bone density:  Lipid panel:  Allergies  Allergen Reactions  .  Codeine     REACTION: nausea,  . Tramadol Hcl Nausea Only    Current Outpatient Medications  Medication Sig Dispense Refill  . alum & mag hydroxide-simeth (MAALOX PLUS) 400-400-40 MG/5ML suspension Take 15 mLs by mouth every 6 (six) hours as needed for indigestion. 355 mL 0  . amLODipine (NORVASC) 2.5 MG tablet Take 1 tablet (2.5 mg total) by mouth daily. 90 tablet 3  . aspirin EC 81 MG tablet Take 81 mg by mouth daily.    . benzonatate (TESSALON) 100 MG capsule     . cholecalciferol (VITAMIN D3) 25 MCG (1000 UT) tablet Take 1 tablet (1,000 Units total) by mouth daily.    . COMBIVENT RESPIMAT 20-100 MCG/ACT AERS respimat     . diazepam (VALIUM) 5 MG tablet     . doxycycline (VIBRA-TABS) 100 MG tablet     . fluconazole (DIFLUCAN) 100 MG tablet     .  HYDROcodone-acetaminophen (NORCO) 7.5-325 MG tablet     . Ipratropium-Albuterol (COMBIVENT) 20-100 MCG/ACT AERS respimat Inhale 1 puff into the lungs every 6 (six) hours. 1 Inhaler 0  . losartan (COZAAR) 100 MG tablet Take 1 tablet (100 mg total) by mouth daily. 90 tablet 3  . morphine 10 MG/5ML solution Take 1.3-2.5 mLs (2.6-5 mg total) by mouth every 6 (six) hours as needed for severe pain. 30 mL 0  . nystatin (MYCOSTATIN) 100000 UNIT/ML suspension     . ondansetron (ZOFRAN) 8 MG tablet Take 1 tablet (8 mg total) by mouth every 8 (eight) hours as needed for nausea or vomiting. 20 tablet 0  . palbociclib (IBRANCE) 125 MG capsule Take 1 capsule (125 mg total) by mouth daily. Take after lunch. Take for 21 days on, 7 days off, repeat every 28 days. 21 capsule 6  . pantoprazole (PROTONIX) 20 MG tablet Take 1 tablet (20 mg total) by mouth daily. 30 tablet 0  . prochlorperazine (COMPAZINE) 25 MG suppository Place 1 suppository (25 mg total) rectally every 12 (twelve) hours as needed for nausea or vomiting. 12 suppository 1  . promethazine (PHENERGAN) 25 MG suppository Place 1 suppository (25 mg total) rectally every 6 (six) hours as needed for nausea or vomiting. 12 each 0  . Spacer/Aero-Holding Chambers DEVI Spacer to be used with inhaler 1 each 0  . sucralfate (CARAFATE) 1 g tablet Take 1 tablet (1 g total) by mouth 4 (four) times daily. 120 tablet 1  . traMADol (ULTRAM) 50 MG tablet     . valACYclovir (VALTREX) 500 MG tablet Take 1 tablet (500 mg total) by mouth 2 (two) times daily. 60 tablet 1   No current facility-administered medications for this visit.     OBJECTIVE: Middle-aged white woman in no acute distress  Vitals:   08/18/18 1130  BP: (!) 131/92  Pulse: 77  Resp: 18  Temp: 98.5 F (36.9 C)  SpO2: 100%     Body mass index is 29.59 kg/m.    ECOG FS:1 - Symptomatic but completely ambulatory  Filed Weights   08/18/18 1130  Weight: 177 lb 12.8 oz (80.6 kg)   Sclerae unicteric,  pupils round and equal Wearing a mask No cervical or supraclavicular adenopathy Lungs no rales or rhonchi Heart regular rate and rhythm Abd soft, nontender, positive bowel sounds MSK no focal spinal tenderness, no upper extremity lymphedema Neuro: nonfocal, well oriented, appropriate affect Breasts: Deferred   LAB RESULTS:  CMP     Component Value Date/Time   NA 144 08/18/2018 1108   NA  141 04/25/2017 1213   NA 145 07/04/2016 1133   K 4.2 08/18/2018 1108   K 4.2 07/04/2016 1133   CL 111 08/18/2018 1108   CO2 26 08/18/2018 1108   CO2 28 07/04/2016 1133   GLUCOSE 98 08/18/2018 1108   GLUCOSE 101 07/04/2016 1133   BUN 11 08/18/2018 1108   BUN 18 04/25/2017 1213   BUN 16.0 07/04/2016 1133   CREATININE 1.18 (H) 08/18/2018 1108   CREATININE 1.09 (H) 07/23/2018 1339   CREATININE 0.9 07/04/2016 1133   CALCIUM 9.4 08/18/2018 1108   CALCIUM 9.9 07/04/2016 1133   PROT 7.0 08/18/2018 1108   PROT 7.3 07/04/2016 1133   ALBUMIN 3.7 08/18/2018 1108   ALBUMIN 3.8 07/04/2016 1133   AST 36 08/18/2018 1108   AST 35 07/23/2018 1339   AST 19 07/04/2016 1133   ALT 22 08/18/2018 1108   ALT 25 07/23/2018 1339   ALT 17 07/04/2016 1133   ALKPHOS 81 08/18/2018 1108   ALKPHOS 172 (H) 07/04/2016 1133   BILITOT 1.2 08/18/2018 1108   BILITOT 1.1 07/23/2018 1339   BILITOT 0.94 07/04/2016 1133   GFRNONAA 50 (L) 08/18/2018 1108   GFRNONAA 55 (L) 07/23/2018 1339   GFRAA 58 (L) 08/18/2018 1108   GFRAA >60 07/23/2018 1339    INo results found for: SPEP, UPEP  Lab Results  Component Value Date   WBC 5.5 08/18/2018   NEUTROABS 3.6 08/18/2018   HGB 12.5 08/18/2018   HCT 36.6 08/18/2018   MCV 105.5 (H) 08/18/2018   PLT 204 08/18/2018      Chemistry      Component Value Date/Time   NA 144 08/18/2018 1108   NA 141 04/25/2017 1213   NA 145 07/04/2016 1133   K 4.2 08/18/2018 1108   K 4.2 07/04/2016 1133   CL 111 08/18/2018 1108   CO2 26 08/18/2018 1108   CO2 28 07/04/2016 1133   BUN  11 08/18/2018 1108   BUN 18 04/25/2017 1213   BUN 16.0 07/04/2016 1133   CREATININE 1.18 (H) 08/18/2018 1108   CREATININE 1.09 (H) 07/23/2018 1339   CREATININE 0.9 07/04/2016 1133      Component Value Date/Time   CALCIUM 9.4 08/18/2018 1108   CALCIUM 9.9 07/04/2016 1133   ALKPHOS 81 08/18/2018 1108   ALKPHOS 172 (H) 07/04/2016 1133   AST 36 08/18/2018 1108   AST 35 07/23/2018 1339   AST 19 07/04/2016 1133   ALT 22 08/18/2018 1108   ALT 25 07/23/2018 1339   ALT 17 07/04/2016 1133   BILITOT 1.2 08/18/2018 1108   BILITOT 1.1 07/23/2018 1339   BILITOT 0.94 07/04/2016 1133     No results found for: LABCA2  No components found for: LABCA125  No results for input(s): INR in the last 168 hours.  Urinalysis    Component Value Date/Time   COLORURINE YELLOW 07/20/2018 [place] 07/20/2018 1747   LABSPEC 1.019 07/20/2018 1747   PHURINE 5.0 07/20/2018 1747   GLUCOSEU NEGATIVE 07/20/2018 1747   GLUCOSEU NEGATIVE 10/20/2017 1303   HGBUR NEGATIVE 07/20/2018 1747   BILIRUBINUR NEGATIVE 07/20/2018 1747   KETONESUR 20 (A) 07/20/2018 1747   PROTEINUR NEGATIVE 07/20/2018 1747   UROBILINOGEN 1.0 10/20/2017 1303   NITRITE NEGATIVE 07/20/2018 1747   LEUKOCYTESUR NEGATIVE 07/20/2018 1747    STUDIES: No results found.   ASSESSMENT: 63 y.o. [place] woman status post left lumpectomy 12/28/2014 for ductal carcinoma in situ, high-grade measuring 0.2 cm, estrogen and progesterone receptor  positive, with close but negative margins  (1) adjuvant radiation completed 03/24/2015  (2) anastrozole started 04/19/2015   (a) bone density 03/01/2016 shows a T score of -1.2  (3) mildly abnormal hepatic function panel: negative workup for hepatitis B and C, normal alpha-1-anti-trypsin  (4) left lower extremity DVT diagnosed by Doppler ultrasonography 04/16/2016, with negative CT angiogram chest  (a) on Xarelto starting 04/16/2016   (b) negative hypercoagulable panel, with a  minimally abnormal (indeterminate) anti-cardiolipin IgM  ((c) stopped rivaroxaban 07/16/2016, repeat D-Dimer WNL August 2018  METASTATIC DISEASE: December 2019: bone only (5) CT chest 02/06/2018 shows a mixed lytic and sclerotic process with fracture at the inferior manubrium but no other bone lesions.  However bone scan 02/16/2018 shows multiple bone lesions consistent with metastatic disease; no lung or pleura involvement, no adenopathy  (a) Bone marrow biopsy on 03/03/2018 shows metastatic carcinoma consistent with breast primary, estrogen and progesterone receptor strongly positive, HER-2 not amplified (1+)  (b) MRI abdomen and MRI brain at [facility] (results viewable in "care everywhere") notes known bone involvement in spine and pelvis, but no other area of metastatic disease in abdomen/pelvis, or brain.  (c) CA 27.29 not informative (37.9 on 03/03/2018)  (6) started denosumab/Xgeva on 03/18/2018, repeated Q28 d  (7) started fulvestrant on 03/03/2018 and palbociclib 03/18/2018 at 125 mg/d 21/7  (a) bone scan 06/10/2018 serves as our new baseline  (8) palliative radiation completed 07/14/2018  (a) esophagitis secondary to radiation has resolved   PLAN: [place] is finally recovering from the esophagitis she suffered secondary to radiation.  She is not hydrating and nourishing herself well and her weight is slightly up.  That is very favorable.  She continues to tolerate her treatment well, with no significant side effects.  Her baseline bone scan was in April.  We should repeat that in September and I will obtain a CT scan of the chest at the same time.  Assuming that is stable the next one would be in March or April of next year.  She is taking appropriate pandemic precautions.  She has mild to moderate pain secondary to her cancer but this does not keep her from working or exercising to some extent.  She is mildly constipated from the occasional narcotic she uses.  Likely she will stop  using narcotics altogether very soon  She knows to call for any other issue that may develop before the next visit.    Magrinat, Virgie Dad, MD  08/18/18 11:58 AM Medical Oncology and Hematology [facility] [address] [place], [place] [address] Tel. [phone]    Fax. [phone]  I, Jacqualyn Posey am acting as a Education administrator for Chauncey Cruel, MD.   I, Lurline Del MD, have reviewed the above documentation for accuracy and completeness, and I agree with the above.

## 2018-08-18 ENCOUNTER — Inpatient Hospital Stay: Payer: 59

## 2018-08-18 ENCOUNTER — Other Ambulatory Visit: Payer: Self-pay

## 2018-08-18 ENCOUNTER — Inpatient Hospital Stay (HOSPITAL_BASED_OUTPATIENT_CLINIC_OR_DEPARTMENT_OTHER): Payer: 59 | Admitting: Oncology

## 2018-08-18 VITALS — BP 131/92 | HR 77 | Temp 98.5°F | Resp 18 | Ht 65.0 in | Wt 177.8 lb

## 2018-08-18 DIAGNOSIS — Z923 Personal history of irradiation: Secondary | ICD-10-CM

## 2018-08-18 DIAGNOSIS — C50512 Malignant neoplasm of lower-outer quadrant of left female breast: Secondary | ICD-10-CM | POA: Diagnosis not present

## 2018-08-18 DIAGNOSIS — Z5111 Encounter for antineoplastic chemotherapy: Secondary | ICD-10-CM | POA: Diagnosis not present

## 2018-08-18 DIAGNOSIS — C7951 Secondary malignant neoplasm of bone: Secondary | ICD-10-CM | POA: Diagnosis not present

## 2018-08-18 DIAGNOSIS — E785 Hyperlipidemia, unspecified: Secondary | ICD-10-CM

## 2018-08-18 DIAGNOSIS — Z7901 Long term (current) use of anticoagulants: Secondary | ICD-10-CM

## 2018-08-18 DIAGNOSIS — J449 Chronic obstructive pulmonary disease, unspecified: Secondary | ICD-10-CM

## 2018-08-18 DIAGNOSIS — E893 Postprocedural hypopituitarism: Secondary | ICD-10-CM

## 2018-08-18 DIAGNOSIS — Z170 Estrogen receptor positive status [ER+]: Secondary | ICD-10-CM | POA: Diagnosis not present

## 2018-08-18 DIAGNOSIS — Z90710 Acquired absence of both cervix and uterus: Secondary | ICD-10-CM

## 2018-08-18 DIAGNOSIS — Z803 Family history of malignant neoplasm of breast: Secondary | ICD-10-CM

## 2018-08-18 DIAGNOSIS — Z87891 Personal history of nicotine dependence: Secondary | ICD-10-CM

## 2018-08-18 DIAGNOSIS — Z79899 Other long term (current) drug therapy: Secondary | ICD-10-CM

## 2018-08-18 DIAGNOSIS — I824Z2 Acute embolism and thrombosis of unspecified deep veins of left distal lower extremity: Secondary | ICD-10-CM

## 2018-08-18 DIAGNOSIS — Z86718 Personal history of other venous thrombosis and embolism: Secondary | ICD-10-CM

## 2018-08-18 DIAGNOSIS — I10 Essential (primary) hypertension: Secondary | ICD-10-CM

## 2018-08-18 DIAGNOSIS — Z7982 Long term (current) use of aspirin: Secondary | ICD-10-CM

## 2018-08-18 LAB — COMPREHENSIVE METABOLIC PANEL
ALT: 22 U/L (ref 0–44)
AST: 36 U/L (ref 15–41)
Albumin: 3.7 g/dL (ref 3.5–5.0)
Alkaline Phosphatase: 81 U/L (ref 38–126)
Anion gap: 7 (ref 5–15)
BUN: 11 mg/dL (ref 8–23)
CO2: 26 mmol/L (ref 22–32)
Calcium: 9.4 mg/dL (ref 8.9–10.3)
Chloride: 111 mmol/L (ref 98–111)
Creatinine, Ser: 1.18 mg/dL — ABNORMAL HIGH (ref 0.44–1.00)
GFR calc Af Amer: 58 mL/min — ABNORMAL LOW (ref >60)
GFR calc non Af Amer: 50 mL/min — ABNORMAL LOW (ref >60)
Glucose, Bld: 98 mg/dL (ref 70–99)
Potassium: 4.2 mmol/L (ref 3.5–5.1)
Sodium: 144 mmol/L (ref 135–145)
Total Bilirubin: 1.2 mg/dL (ref 0.3–1.2)
Total Protein: 7 g/dL (ref 6.5–8.1)

## 2018-08-18 LAB — CBC WITH DIFFERENTIAL/PLATELET
Abs Immature Granulocytes: 0.03 10*3/uL (ref 0.00–0.07)
Basophils Absolute: 0.1 10*3/uL (ref 0.0–0.1)
Basophils Relative: 1 %
Eosinophils Absolute: 0.2 10*3/uL (ref 0.0–0.5)
Eosinophils Relative: 4 %
HCT: 36.6 % (ref 36.0–46.0)
Hemoglobin: 12.5 g/dL (ref 12.0–15.0)
Immature Granulocytes: 1 %
Lymphocytes Relative: 17 %
Lymphs Abs: 0.9 10*3/uL (ref 0.7–4.0)
MCH: 36 pg — ABNORMAL HIGH (ref 26.0–34.0)
MCHC: 34.2 g/dL (ref 30.0–36.0)
MCV: 105.5 fL — ABNORMAL HIGH (ref 80.0–100.0)
Monocytes Absolute: 0.7 10*3/uL (ref 0.1–1.0)
Monocytes Relative: 12 %
Neutro Abs: 3.6 10*3/uL (ref 1.7–7.7)
Neutrophils Relative %: 65 %
Platelets: 204 10*3/uL (ref 150–400)
RBC: 3.47 MIL/uL — ABNORMAL LOW (ref 3.87–5.11)
RDW: 13.2 % (ref 11.5–15.5)
WBC: 5.5 10*3/uL (ref 4.0–10.5)
nRBC: 0 % (ref 0.0–0.2)

## 2018-08-18 MED ORDER — DENOSUMAB 120 MG/1.7ML ~~LOC~~ SOLN
120.0000 mg | Freq: Once | SUBCUTANEOUS | Status: AC
  Administered 2018-08-18: 120 mg via SUBCUTANEOUS

## 2018-08-18 MED ORDER — FULVESTRANT 250 MG/5ML IM SOLN
500.0000 mg | Freq: Once | INTRAMUSCULAR | Status: AC
  Administered 2018-08-18: 500 mg via INTRAMUSCULAR

## 2018-08-18 MED ORDER — DENOSUMAB 120 MG/1.7ML ~~LOC~~ SOLN
SUBCUTANEOUS | Status: AC
  Filled 2018-08-18: qty 1.7

## 2018-08-18 MED ORDER — FULVESTRANT 250 MG/5ML IM SOLN
INTRAMUSCULAR | Status: AC
  Filled 2018-08-18: qty 5

## 2018-08-18 NOTE — Patient Instructions (Signed)
Fulvestrant injection What is this medicine? FULVESTRANT (ful VES trant) blocks the effects of estrogen. It is used to treat breast cancer. This medicine may be used for other purposes; ask your health care provider or pharmacist if you have questions. COMMON BRAND NAME(S): FASLODEX What should I tell my health care provider before I take this medicine? They need to know if you have any of these conditions: -bleeding disorders -liver disease -low blood counts, like low white cell, platelet, or red cell counts -an unusual or allergic reaction to fulvestrant, other medicines, foods, dyes, or preservatives -pregnant or trying to get pregnant -breast-feeding How should I use this medicine? This medicine is for injection into a muscle. It is usually given by a health care professional in a hospital or clinic setting. Talk to your pediatrician regarding the use of this medicine in children. Special care may be needed. Overdosage: If you think you have taken too much of this medicine contact a poison control center or emergency room at once. NOTE: This medicine is only for you. Do not share this medicine with others. What if I miss a dose? It is important not to miss your dose. Call your doctor or health care professional if you are unable to keep an appointment. What may interact with this medicine? -medicines that treat or prevent blood clots like warfarin, enoxaparin, dalteparin, apixaban, dabigatran, and rivaroxaban This list may not describe all possible interactions. Give your health care provider a list of all the medicines, herbs, non-prescription drugs, or dietary supplements you use. Also tell them if you smoke, drink alcohol, or use illegal drugs. Some items may interact with your medicine. What should I watch for while using this medicine? Your condition will be monitored carefully while you are receiving this medicine. You will need important blood work done while you are taking this  medicine. Do not become pregnant while taking this medicine or for at least 1 year after stopping it. Women of child-bearing potential will need to have a negative pregnancy test before starting this medicine. Women should inform their doctor if they wish to become pregnant or think they might be pregnant. There is a potential for serious side effects to an unborn child. Men should inform their doctors if they wish to father a child. This medicine may lower sperm counts. Talk to your health care professional or pharmacist for more information. Do not breast-feed an infant while taking this medicine or for 1 year after the last dose. What side effects may I notice from receiving this medicine? Side effects that you should report to your doctor or health care professional as soon as possible: -allergic reactions like skin rash, itching or hives, swelling of the face, lips, or tongue -feeling faint or lightheaded, falls -pain, tingling, numbness, or weakness in the legs -signs and symptoms of infection like fever or chills; cough; flu-like symptoms; sore throat -vaginal bleeding Side effects that usually do not require medical attention (report to your doctor or health care professional if they continue or are bothersome): -aches, pains -constipation -diarrhea -headache -hot flashes -nausea, vomiting -pain at site where injected -stomach pain This list may not describe all possible side effects. Call your doctor for medical advice about side effects. You may report side effects to FDA at 1-800-FDA-1088. Where should I keep my medicine? This drug is given in a hospital or clinic and will not be stored at home. NOTE: This sheet is a summary. It may not cover all possible information. If you  have questions about this medicine, talk to your doctor, pharmacist, or health care provider.  2019 [employer] Standard (2017-05-22 11:34:41) Denosumab injection What is this medicine? DENOSUMAB (den oh  sue mab) slows bone breakdown. Prolia is used to treat osteoporosis in women after menopause and in men, and in people who are taking corticosteroids for 6 months or more. Delton See is used to treat a high calcium level due to cancer and to prevent bone fractures and other bone problems caused by multiple myeloma or cancer bone metastases. Delton See is also used to treat giant cell tumor of the bone. This medicine may be used for other purposes; ask your health care provider or pharmacist if you have questions. COMMON BRAND NAME(S): Prolia, XGEVA What should I tell my health care provider before I take this medicine? They need to know if you have any of these conditions: -dental disease -having surgery or tooth extraction -infection -kidney disease -low levels of calcium or Vitamin D in the blood -malnutrition -on hemodialysis -skin conditions or sensitivity -thyroid or parathyroid disease -an unusual reaction to denosumab, other medicines, foods, dyes, or preservatives -pregnant or trying to get pregnant -breast-feeding How should I use this medicine? This medicine is for injection under the skin. It is given by a health care professional in a hospital or clinic setting. A special MedGuide will be given to you before each treatment. Be sure to read this information carefully each time. For Prolia, talk to your pediatrician regarding the use of this medicine in children. Special care may be needed. For Delton See, talk to your pediatrician regarding the use of this medicine in children. While this drug may be prescribed for children as young as 13 years for selected conditions, precautions do apply. Overdosage: If you think you have taken too much of this medicine contact a poison control center or emergency room at once. NOTE: This medicine is only for you. Do not share this medicine with others. What if I miss a dose? It is important not to miss your dose. Call your doctor or health care professional if  you are unable to keep an appointment. What may interact with this medicine? Do not take this medicine with any of the following medications: -other medicines containing denosumab This medicine may also interact with the following medications: -medicines that lower your chance of fighting infection -steroid medicines like prednisone or cortisone This list may not describe all possible interactions. Give your health care provider a list of all the medicines, herbs, non-prescription drugs, or dietary supplements you use. Also tell them if you smoke, drink alcohol, or use illegal drugs. Some items may interact with your medicine. What should I watch for while using this medicine? Visit your doctor or health care professional for regular checks on your progress. Your doctor or health care professional may order blood tests and other tests to see how you are doing. Call your doctor or health care professional for advice if you get a fever, chills or sore throat, or other symptoms of a cold or flu. Do not treat yourself. This drug may decrease your body's ability to fight infection. Try to avoid being around people who are sick. You should make sure you get enough calcium and vitamin D while you are taking this medicine, unless your doctor tells you not to. Discuss the foods you eat and the vitamins you take with your health care professional. See your dentist regularly. Brush and floss your teeth as directed. Before you have any dental work  dental work done, tell your dentist you are receiving this medicine. Do not become pregnant while taking this medicine or for 5 months after stopping it. Talk with your doctor or health care professional about your birth control options while taking this medicine. Women should inform their doctor if they wish to become pregnant or think they might be pregnant. There is a potential for serious side effects to an unborn child. Talk to your health care professional or pharmacist for  more information. What side effects may I notice from receiving this medicine? Side effects that you should report to your doctor or health care professional as soon as possible: -allergic reactions like skin rash, itching or hives, swelling of the face, lips, or tongue -bone pain -breathing problems -dizziness -jaw pain, especially after dental work -redness, blistering, peeling of the skin -signs and symptoms of infection like fever or chills; cough; sore throat; pain or trouble passing urine -signs of low calcium like fast heartbeat, muscle cramps or muscle pain; pain, tingling, numbness in the hands or feet; seizures -unusual bleeding or bruising -unusually weak or tired Side effects that usually do not require medical attention (report to your doctor or health care professional if they continue or are bothersome): -constipation -diarrhea -headache -joint pain -loss of appetite -muscle pain -runny nose -tiredness -upset stomach This list may not describe all possible side effects. Call your doctor for medical advice about side effects. You may report side effects to FDA at 1-800-FDA-1088. Where should I keep my medicine? This medicine is only given in a clinic, doctor's office, or other health care setting and will not be stored at home. NOTE: This sheet is a summary. It may not cover all possible information. If you have questions about this medicine, talk to your doctor, pharmacist, or health care provider.  2019 [employer]/Gold Standard (2017-06-20 16:10:44)  

## 2018-08-24 ENCOUNTER — Telehealth: Payer: Self-pay | Admitting: Radiation Oncology

## 2018-08-24 NOTE — Telephone Encounter (Signed)
  Radiation Oncology         [phone]) [phone] ________________________________  Name: Caitlin Wall MRN: [id number]  Date of Service: 08/24/2018  DOB: 1955/07/25  Post Treatment Telephone Note  Diagnosis:   Recurrent metastatic ER/PR positive carcinoma of the breast with painful bone metastases  Interval Since Last Radiation:  6 weeks   07/01/2018-07/14/2018:  The patient received 30 Gy in 10 fractions to bilateral ribs and lower thoracic spine  02/02/2015 through 03/24/2015: The patient initially received a dose of 50.4 Gy in 28 fractions to the breast using whole-breast tangent fields. This was delivered using a 3-D conformal technique. The patient then received a boost to the seroma. This delivered an additional 10 Gy in 5 fractions using a en face electron field. The total dose was 60.4 Gy.  Narrative:  The patient was contacted today for routine follow-up. During treatment she did very well with radiotherapy and did not have significant desquamation. She reports she did have mild esophagitis which is nearly resolved. No other complaints are noted and she states improvement in her pain since XRT.  Impression/Plan: 1. Recurrent metastatic ER/PR positive carcinoma of the breast with painful bone metastases. The patient has been doing well since completion of radiotherapy. We discussed that we would be happy to continue to follow her as needed, but she will also continue to follow up with Dr. Jana Hakim in medical oncology.  2. Radiation Esophagitis. This is nearly resolved but we talked about some OTC options to try and help serve as a temporary barrier for food and pills.      Carola Rhine, PAC

## 2018-08-31 ENCOUNTER — Other Ambulatory Visit: Payer: Self-pay | Admitting: *Deleted

## 2018-08-31 ENCOUNTER — Telehealth: Payer: Self-pay | Admitting: *Deleted

## 2018-08-31 MED ORDER — BENZONATATE 100 MG PO CAPS: 100.0000 mg | ORAL_CAPSULE | Freq: Three times a day (TID) | ORAL | 1 refills | Status: DC | PRN

## 2018-08-31 MED ORDER — AZITHROMYCIN 250 MG PO TABS: ORAL_TABLET | ORAL | 0 refills | Status: DC

## 2018-09-03 ENCOUNTER — Other Ambulatory Visit: Payer: Self-pay | Admitting: Adult Health

## 2018-09-03 DIAGNOSIS — B9689 Other specified bacterial agents as the cause of diseases classified elsewhere: Secondary | ICD-10-CM

## 2018-09-09 ENCOUNTER — Other Ambulatory Visit: Payer: Self-pay | Admitting: Oncology

## 2018-09-10 NOTE — Progress Notes (Signed)
  Radiation Oncology         [phone]) [phone] ________________________________  Name: Earlyne Feeser MRN: [id number]  Date: 07/14/2018  DOB: 1955/12/22  End of Treatment Note  Diagnosis:   63 y.o. female with Recurrent metastatic ER/PR positive carcinoma of the breast with painful bone metastases    Indication for treatment:  Palliative       Radiation treatment dates:   07/01/2018 - 07/14/2018  Site/dose:    1. T7/8 spine // 30 Gy in 10 fractions 2. Left 8th rib // 30 Gy in 10 fractions 3. Right 8th rib // 30 Gy in 10 fractions  Beams/energy:    1. 3D / 15X, 10X Photon 2. 3D / 15X Photon 3. 3D / 15X Photon   Narrative: The patient tolerated radiation treatment relatively well.  She denied any fatigue. She developed odynophagia but was able to maintain an adequate appetite. She also noted some hyperpigmentation changes in the treatment fields. She endorses continued pain and is taking ibuprofen with relief.   Plan: The patient has completed radiation treatment. The patient was given a prescription for Carafate and was advised to take Prilosec as well, if needed. The patient will return to radiation oncology clinic for routine followup in one month. I advised them to call or return sooner if they have any questions or concerns related to their recovery or treatment.  ------------------------------------------------  Jodelle Gross, MD, PhD  This document serves as a record of services personally performed by Kyung Rudd, MD. It was created on his behalf by Rae Lips, a trained medical scribe. The creation of this record is based on the scribe's personal observations and the provider's statements to them. This document has been checked and approved by the attending provider.

## 2018-09-15 ENCOUNTER — Inpatient Hospital Stay: Payer: 59 | Attending: Oncology

## 2018-09-15 ENCOUNTER — Inpatient Hospital Stay: Payer: 59

## 2018-09-15 ENCOUNTER — Other Ambulatory Visit: Payer: 59

## 2018-09-15 ENCOUNTER — Ambulatory Visit: Payer: 59

## 2018-09-15 ENCOUNTER — Other Ambulatory Visit: Payer: Self-pay

## 2018-09-15 VITALS — BP 138/78 | HR 72 | Temp 98.2°F | Resp 18

## 2018-09-15 DIAGNOSIS — C50512 Malignant neoplasm of lower-outer quadrant of left female breast: Secondary | ICD-10-CM | POA: Diagnosis present

## 2018-09-15 DIAGNOSIS — Z170 Estrogen receptor positive status [ER+]: Secondary | ICD-10-CM | POA: Insufficient documentation

## 2018-09-15 DIAGNOSIS — C7951 Secondary malignant neoplasm of bone: Secondary | ICD-10-CM | POA: Insufficient documentation

## 2018-09-15 DIAGNOSIS — Z5111 Encounter for antineoplastic chemotherapy: Secondary | ICD-10-CM | POA: Insufficient documentation

## 2018-09-15 DIAGNOSIS — I824Z2 Acute embolism and thrombosis of unspecified deep veins of left distal lower extremity: Secondary | ICD-10-CM

## 2018-09-15 LAB — CBC WITH DIFFERENTIAL/PLATELET
Abs Immature Granulocytes: 0.01 10*3/uL (ref 0.00–0.07)
Basophils Absolute: 0 10*3/uL (ref 0.0–0.1)
Basophils Relative: 1 %
Eosinophils Absolute: 0.3 10*3/uL (ref 0.0–0.5)
Eosinophils Relative: 7 %
HCT: 37.2 % (ref 36.0–46.0)
Hemoglobin: 12.3 g/dL (ref 12.0–15.0)
Immature Granulocytes: 0 %
Lymphocytes Relative: 16 %
Lymphs Abs: 0.6 10*3/uL — ABNORMAL LOW (ref 0.7–4.0)
MCH: 34.4 pg — ABNORMAL HIGH (ref 26.0–34.0)
MCHC: 33.1 g/dL (ref 30.0–36.0)
MCV: 103.9 fL — ABNORMAL HIGH (ref 80.0–100.0)
Monocytes Absolute: 0.2 10*3/uL (ref 0.1–1.0)
Monocytes Relative: 4 %
Neutro Abs: 2.9 10*3/uL (ref 1.7–7.7)
Neutrophils Relative %: 72 %
Platelets: 212 10*3/uL (ref 150–400)
RBC: 3.58 MIL/uL — ABNORMAL LOW (ref 3.87–5.11)
RDW: 12.2 % (ref 11.5–15.5)
WBC: 4 10*3/uL (ref 4.0–10.5)
nRBC: 0 % (ref 0.0–0.2)

## 2018-09-15 LAB — COMPREHENSIVE METABOLIC PANEL
ALT: 17 U/L (ref 0–44)
AST: 28 U/L (ref 15–41)
Albumin: 3.5 g/dL (ref 3.5–5.0)
Alkaline Phosphatase: 95 U/L (ref 38–126)
Anion gap: 9 (ref 5–15)
BUN: 16 mg/dL (ref 8–23)
CO2: 27 mmol/L (ref 22–32)
Calcium: 9.1 mg/dL (ref 8.9–10.3)
Chloride: 108 mmol/L (ref 98–111)
Creatinine, Ser: 1.41 mg/dL — ABNORMAL HIGH (ref 0.44–1.00)
GFR calc Af Amer: 46 mL/min — ABNORMAL LOW (ref >60)
GFR calc non Af Amer: 40 mL/min — ABNORMAL LOW (ref >60)
Glucose, Bld: 108 mg/dL — ABNORMAL HIGH (ref 70–99)
Potassium: 4.5 mmol/L (ref 3.5–5.1)
Sodium: 144 mmol/L (ref 135–145)
Total Bilirubin: 1.9 mg/dL — ABNORMAL HIGH (ref 0.3–1.2)
Total Protein: 7 g/dL (ref 6.5–8.1)

## 2018-09-15 MED ORDER — FULVESTRANT 250 MG/5ML IM SOLN
500.0000 mg | Freq: Once | INTRAMUSCULAR | Status: AC
  Administered 2018-09-15: 500 mg via INTRAMUSCULAR

## 2018-09-15 MED ORDER — DENOSUMAB 120 MG/1.7ML ~~LOC~~ SOLN
120.0000 mg | Freq: Once | SUBCUTANEOUS | Status: AC
  Administered 2018-09-15: 120 mg via SUBCUTANEOUS

## 2018-09-15 NOTE — Patient Instructions (Signed)
Fulvestrant injection What is this medicine? FULVESTRANT (ful VES trant) blocks the effects of estrogen. It is used to treat breast cancer. This medicine may be used for other purposes; ask your health care provider or pharmacist if you have questions. COMMON BRAND NAME(S): FASLODEX What should I tell my health care provider before I take this medicine? They need to know if you have any of these conditions: -bleeding disorders -liver disease -low blood counts, like low white cell, platelet, or red cell counts -an unusual or allergic reaction to fulvestrant, other medicines, foods, dyes, or preservatives -pregnant or trying to get pregnant -breast-feeding How should I use this medicine? This medicine is for injection into a muscle. It is usually given by a health care professional in a hospital or clinic setting. Talk to your pediatrician regarding the use of this medicine in children. Special care may be needed. Overdosage: If you think you have taken too much of this medicine contact a poison control center or emergency room at once. NOTE: This medicine is only for you. Do not share this medicine with others. What if I miss a dose? It is important not to miss your dose. Call your doctor or health care professional if you are unable to keep an appointment. What may interact with this medicine? -medicines that treat or prevent blood clots like warfarin, enoxaparin, dalteparin, apixaban, dabigatran, and rivaroxaban This list may not describe all possible interactions. Give your health care provider a list of all the medicines, herbs, non-prescription drugs, or dietary supplements you use. Also tell them if you smoke, drink alcohol, or use illegal drugs. Some items may interact with your medicine. What should I watch for while using this medicine? Your condition will be monitored carefully while you are receiving this medicine. You will need important blood work done while you are taking this  medicine. Do not become pregnant while taking this medicine or for at least 1 year after stopping it. Women of child-bearing potential will need to have a negative pregnancy test before starting this medicine. Women should inform their doctor if they wish to become pregnant or think they might be pregnant. There is a potential for serious side effects to an unborn child. Men should inform their doctors if they wish to father a child. This medicine may lower sperm counts. Talk to your health care professional or pharmacist for more information. Do not breast-feed an infant while taking this medicine or for 1 year after the last dose. What side effects may I notice from receiving this medicine? Side effects that you should report to your doctor or health care professional as soon as possible: -allergic reactions like skin rash, itching or hives, swelling of the face, lips, or tongue -feeling faint or lightheaded, falls -pain, tingling, numbness, or weakness in the legs -signs and symptoms of infection like fever or chills; cough; flu-like symptoms; sore throat -vaginal bleeding Side effects that usually do not require medical attention (report to your doctor or health care professional if they continue or are bothersome): -aches, pains -constipation -diarrhea -headache -hot flashes -nausea, vomiting -pain at site where injected -stomach pain This list may not describe all possible side effects. Call your doctor for medical advice about side effects. You may report side effects to FDA at 1-800-FDA-1088. Where should I keep my medicine? This drug is given in a hospital or clinic and will not be stored at home. NOTE: This sheet is a summary. It may not cover all possible information. If you  have questions about this medicine, talk to your doctor, pharmacist, or health care provider.  2019 [employer] Standard (2017-05-22 11:34:41) Denosumab injection What is this medicine? DENOSUMAB (den oh  sue mab) slows bone breakdown. Prolia is used to treat osteoporosis in women after menopause and in men, and in people who are taking corticosteroids for 6 months or more. Delton See is used to treat a high calcium level due to cancer and to prevent bone fractures and other bone problems caused by multiple myeloma or cancer bone metastases. Delton See is also used to treat giant cell tumor of the bone. This medicine may be used for other purposes; ask your health care provider or pharmacist if you have questions. COMMON BRAND NAME(S): Prolia, XGEVA What should I tell my health care provider before I take this medicine? They need to know if you have any of these conditions: -dental disease -having surgery or tooth extraction -infection -kidney disease -low levels of calcium or Vitamin D in the blood -malnutrition -on hemodialysis -skin conditions or sensitivity -thyroid or parathyroid disease -an unusual reaction to denosumab, other medicines, foods, dyes, or preservatives -pregnant or trying to get pregnant -breast-feeding How should I use this medicine? This medicine is for injection under the skin. It is given by a health care professional in a hospital or clinic setting. A special MedGuide will be given to you before each treatment. Be sure to read this information carefully each time. For Prolia, talk to your pediatrician regarding the use of this medicine in children. Special care may be needed. For Delton See, talk to your pediatrician regarding the use of this medicine in children. While this drug may be prescribed for children as young as 13 years for selected conditions, precautions do apply. Overdosage: If you think you have taken too much of this medicine contact a poison control center or emergency room at once. NOTE: This medicine is only for you. Do not share this medicine with others. What if I miss a dose? It is important not to miss your dose. Call your doctor or health care professional if  you are unable to keep an appointment. What may interact with this medicine? Do not take this medicine with any of the following medications: -other medicines containing denosumab This medicine may also interact with the following medications: -medicines that lower your chance of fighting infection -steroid medicines like prednisone or cortisone This list may not describe all possible interactions. Give your health care provider a list of all the medicines, herbs, non-prescription drugs, or dietary supplements you use. Also tell them if you smoke, drink alcohol, or use illegal drugs. Some items may interact with your medicine. What should I watch for while using this medicine? Visit your doctor or health care professional for regular checks on your progress. Your doctor or health care professional may order blood tests and other tests to see how you are doing. Call your doctor or health care professional for advice if you get a fever, chills or sore throat, or other symptoms of a cold or flu. Do not treat yourself. This drug may decrease your body's ability to fight infection. Try to avoid being around people who are sick. You should make sure you get enough calcium and vitamin D while you are taking this medicine, unless your doctor tells you not to. Discuss the foods you eat and the vitamins you take with your health care professional. See your dentist regularly. Brush and floss your teeth as directed. Before you have any dental work  dental work done, tell your dentist you are receiving this medicine. Do not become pregnant while taking this medicine or for 5 months after stopping it. Talk with your doctor or health care professional about your birth control options while taking this medicine. Women should inform their doctor if they wish to become pregnant or think they might be pregnant. There is a potential for serious side effects to an unborn child. Talk to your health care professional or pharmacist for  more information. What side effects may I notice from receiving this medicine? Side effects that you should report to your doctor or health care professional as soon as possible: -allergic reactions like skin rash, itching or hives, swelling of the face, lips, or tongue -bone pain -breathing problems -dizziness -jaw pain, especially after dental work -redness, blistering, peeling of the skin -signs and symptoms of infection like fever or chills; cough; sore throat; pain or trouble passing urine -signs of low calcium like fast heartbeat, muscle cramps or muscle pain; pain, tingling, numbness in the hands or feet; seizures -unusual bleeding or bruising -unusually weak or tired Side effects that usually do not require medical attention (report to your doctor or health care professional if they continue or are bothersome): -constipation -diarrhea -headache -joint pain -loss of appetite -muscle pain -runny nose -tiredness -upset stomach This list may not describe all possible side effects. Call your doctor for medical advice about side effects. You may report side effects to FDA at 1-800-FDA-1088. Where should I keep my medicine? This medicine is only given in a clinic, doctor's office, or other health care setting and will not be stored at home. NOTE: This sheet is a summary. It may not cover all possible information. If you have questions about this medicine, talk to your doctor, pharmacist, or health care provider.  2019 [employer]/Gold Standard (2017-06-20 16:10:44)  

## 2018-10-13 ENCOUNTER — Inpatient Hospital Stay: Payer: 59

## 2018-10-13 ENCOUNTER — Ambulatory Visit: Payer: 59

## 2018-10-13 ENCOUNTER — Other Ambulatory Visit: Payer: Self-pay

## 2018-10-13 ENCOUNTER — Other Ambulatory Visit: Payer: 59

## 2018-10-13 ENCOUNTER — Encounter: Payer: Self-pay | Admitting: Adult Health

## 2018-10-13 ENCOUNTER — Inpatient Hospital Stay: Payer: 59 | Attending: Oncology | Admitting: Adult Health

## 2018-10-13 VITALS — BP 113/61 | HR 81 | Temp 98.5°F | Resp 18 | Ht 65.0 in | Wt 179.2 lb

## 2018-10-13 DIAGNOSIS — I824Z2 Acute embolism and thrombosis of unspecified deep veins of left distal lower extremity: Secondary | ICD-10-CM

## 2018-10-13 DIAGNOSIS — Z170 Estrogen receptor positive status [ER+]: Secondary | ICD-10-CM

## 2018-10-13 DIAGNOSIS — Z885 Allergy status to narcotic agent status: Secondary | ICD-10-CM | POA: Insufficient documentation

## 2018-10-13 DIAGNOSIS — C50512 Malignant neoplasm of lower-outer quadrant of left female breast: Secondary | ICD-10-CM | POA: Insufficient documentation

## 2018-10-13 DIAGNOSIS — Z801 Family history of malignant neoplasm of trachea, bronchus and lung: Secondary | ICD-10-CM | POA: Insufficient documentation

## 2018-10-13 DIAGNOSIS — Z86718 Personal history of other venous thrombosis and embolism: Secondary | ICD-10-CM | POA: Diagnosis not present

## 2018-10-13 DIAGNOSIS — Z923 Personal history of irradiation: Secondary | ICD-10-CM | POA: Insufficient documentation

## 2018-10-13 DIAGNOSIS — Z7901 Long term (current) use of anticoagulants: Secondary | ICD-10-CM | POA: Insufficient documentation

## 2018-10-13 DIAGNOSIS — C7951 Secondary malignant neoplasm of bone: Secondary | ICD-10-CM

## 2018-10-13 DIAGNOSIS — Z79899 Other long term (current) drug therapy: Secondary | ICD-10-CM | POA: Insufficient documentation

## 2018-10-13 DIAGNOSIS — J45909 Unspecified asthma, uncomplicated: Secondary | ICD-10-CM | POA: Diagnosis not present

## 2018-10-13 DIAGNOSIS — Z803 Family history of malignant neoplasm of breast: Secondary | ICD-10-CM | POA: Diagnosis not present

## 2018-10-13 DIAGNOSIS — R52 Pain, unspecified: Secondary | ICD-10-CM | POA: Insufficient documentation

## 2018-10-13 DIAGNOSIS — I10 Essential (primary) hypertension: Secondary | ICD-10-CM | POA: Insufficient documentation

## 2018-10-13 DIAGNOSIS — Z87891 Personal history of nicotine dependence: Secondary | ICD-10-CM | POA: Insufficient documentation

## 2018-10-13 DIAGNOSIS — Z5111 Encounter for antineoplastic chemotherapy: Secondary | ICD-10-CM | POA: Diagnosis not present

## 2018-10-13 DIAGNOSIS — Z8719 Personal history of other diseases of the digestive system: Secondary | ICD-10-CM | POA: Diagnosis not present

## 2018-10-13 DIAGNOSIS — Z90710 Acquired absence of both cervix and uterus: Secondary | ICD-10-CM | POA: Diagnosis not present

## 2018-10-13 LAB — CBC WITH DIFFERENTIAL/PLATELET
Abs Immature Granulocytes: 0.01 10*3/uL (ref 0.00–0.07)
Basophils Absolute: 0.1 10*3/uL (ref 0.0–0.1)
Basophils Relative: 2 %
Eosinophils Absolute: 0 10*3/uL (ref 0.0–0.5)
Eosinophils Relative: 1 %
HCT: 34.2 % — ABNORMAL LOW (ref 36.0–46.0)
Hemoglobin: 11.7 g/dL — ABNORMAL LOW (ref 12.0–15.0)
Immature Granulocytes: 0 %
Lymphocytes Relative: 23 %
Lymphs Abs: 0.6 10*3/uL — ABNORMAL LOW (ref 0.7–4.0)
MCH: 35.6 pg — ABNORMAL HIGH (ref 26.0–34.0)
MCHC: 34.2 g/dL (ref 30.0–36.0)
MCV: 104 fL — ABNORMAL HIGH (ref 80.0–100.0)
Monocytes Absolute: 0.4 10*3/uL (ref 0.1–1.0)
Monocytes Relative: 16 %
Neutro Abs: 1.4 10*3/uL — ABNORMAL LOW (ref 1.7–7.7)
Neutrophils Relative %: 58 %
Platelets: 228 10*3/uL (ref 150–400)
RBC: 3.29 MIL/uL — ABNORMAL LOW (ref 3.87–5.11)
RDW: 15.7 % — ABNORMAL HIGH (ref 11.5–15.5)
WBC: 2.5 10*3/uL — ABNORMAL LOW (ref 4.0–10.5)
nRBC: 0 % (ref 0.0–0.2)

## 2018-10-13 LAB — COMPREHENSIVE METABOLIC PANEL
ALT: 20 U/L (ref 0–44)
AST: 27 U/L (ref 15–41)
Albumin: 3.6 g/dL (ref 3.5–5.0)
Alkaline Phosphatase: 88 U/L (ref 38–126)
Anion gap: 10 (ref 5–15)
BUN: 16 mg/dL (ref 8–23)
CO2: 26 mmol/L (ref 22–32)
Calcium: 9 mg/dL (ref 8.9–10.3)
Chloride: 107 mmol/L (ref 98–111)
Creatinine, Ser: 1.16 mg/dL — ABNORMAL HIGH (ref 0.44–1.00)
GFR calc Af Amer: 59 mL/min — ABNORMAL LOW (ref >60)
GFR calc non Af Amer: 51 mL/min — ABNORMAL LOW (ref >60)
Glucose, Bld: 81 mg/dL (ref 70–99)
Potassium: 3.9 mmol/L (ref 3.5–5.1)
Sodium: 143 mmol/L (ref 135–145)
Total Bilirubin: 1.1 mg/dL (ref 0.3–1.2)
Total Protein: 6.7 g/dL (ref 6.5–8.1)

## 2018-10-13 MED ORDER — DENOSUMAB 120 MG/1.7ML ~~LOC~~ SOLN
SUBCUTANEOUS | Status: AC
  Filled 2018-10-13: qty 1.7

## 2018-10-13 MED ORDER — FULVESTRANT 250 MG/5ML IM SOLN
500.0000 mg | Freq: Once | INTRAMUSCULAR | Status: AC
  Administered 2018-10-13: 500 mg via INTRAMUSCULAR

## 2018-10-13 MED ORDER — DENOSUMAB 120 MG/1.7ML ~~LOC~~ SOLN
120.0000 mg | Freq: Once | SUBCUTANEOUS | Status: AC
  Administered 2018-10-13: 120 mg via SUBCUTANEOUS

## 2018-10-13 MED ORDER — VALACYCLOVIR HCL 500 MG PO TABS: 500.0000 mg | ORAL_TABLET | Freq: Two times a day (BID) | ORAL | 1 refills | Status: DC

## 2018-10-13 MED ORDER — FULVESTRANT 250 MG/5ML IM SOLN
INTRAMUSCULAR | Status: AC
  Filled 2018-10-13: qty 5

## 2018-10-13 NOTE — Progress Notes (Signed)
[facility]  [phone] [phone]    ID: Karisha Marlin Pelaez DOB: 14-Nov-1955  MR#: [id number]  IOE#:[id number]  Patient Care Team: Biagio Borg, MD as PCP - General Tamala Julian Lynnell Dike, MD as PCP - Cardiology (Cardiology) Magrinat, Virgie Dad, MD as Consulting Physician (Oncology) Kyung Rudd, MD as Consulting Physician (Radiation Oncology) Jovita Kussmaul, MD as Consulting Physician (General Surgery) Sylvan Cheese, NP as Nurse Practitioner (Hematology and Oncology) OTHER MD: Keturah Barre MD   CHIEF COMPLAINT: Metastatic estrogen receptor positive breast cancer  CURRENT TREATMENT: Denosumab/Xgeva, fluvestrant, palbociclib   INTERVAL HISTORY: Kaylei returns today for follow-up and treatment of her metastatic estrogen receptor positive breast cancer.   She continues on denosumab/Xgeva, with her most recent dose received on 09/15/2018.  She tolerates this well, has no dental/oral issues, and takes extra calcium on day of injection.   She also continues on fulvestrant, with her most recent dose received on 09/15/2018.  She tolerates this well.   She also continues on palbociclib/Ibrance. She tolerates this well and without any noticeable side effects.   She is taking the [facility] now, and is due to go off on 10/22/2018.  She notes she was taking valtrex for ulcers that she got on the [place], however now is out and needs it refilled.  She is getting over an oral ulcer now.    REVIEW OF SYSTEMS: Thara has rib pain.  She takes Morphine 1.38m about once a day to help with the pain.  She takes this at night.  She also takes Ibuprofen if needed throughout the day.  She notes she is able to sleep through the night.  She denies any excessive daytime sleepiness, and notes constipation, that is longstanding and started prior to starting narcotics.  PMP aware notes last fill on 07/31/2018.  JOnnikahas mild fatigue.  Her weight is stable.  She hasn't been losing  weight.  She has an improved activity level.  She had some achiness a couple of days ago that she describes as pain all over her body.  She has some post nasal drip that started when she started the Palbociclib about 2 weeks ago.  She also has some pain over her eye, and under her eye.  She says this is mild.  She has recently been on Doxycycline and a Zpak in June and July for bronchitis.  It has since resolved.  JDaijadenies any fever or chills.  She is without any nausea, vomiting, bowel/bladder changes.  She is without chest pain, palpitations, cough, shortness of breath.  A detailed ROS was otherwise non contributory.    BREAST CANCER HISTORY:  From the original intake note:   JEvanihad [facility]. She has subpectoral saline implants in place. There was a possible asymmetry in the left breast and she was recalled for left diagnostic mammography with tomosynthesis and left breast ultrasonography 11/03/2014. The breast density was category C. In the left breast lower outer quadrant there was an irregular mass which was not palpable and which by ultrasonography measured 0.6 cm. The left axilla was sonographically benign. Biopsy of this mass 11/09/2014 showed (SAA [id number] fibroadipose adipose tissue. This was felt to be discordant.  Accordingly the patient was referred to surgery and she underwent left breast radioactive seed localized lumpectomy 12/28/2014. The pathology from that procedure ((SZA [id number] showed ductal carcinoma in situ, high-grade, measuring 0.2 cm. This was less than 0.1 cm from the  posterior margin. The cells was estrogen receptor positive at 95%, with strong staining intensity, progesterone receptor positive at 30% with moderate staining intensity.  Her subsequent history is as detailed below. PAST MEDICAL HISTORY: Past Medical History:  Diagnosis Date  . Asthma, persistent not controlled 07/27/2012  . Breast cancer ([place])  12/28/14   Left BresastDCIS  . COLONIC POLYPS, HX OF 11/04/2007  . COPD (chronic obstructive pulmonary disease) ([place])   . GERD 11/04/2007  . GERD (gastroesophageal reflux disease)   . HAIR LOSS 11/04/2007  . Hx of cardiac cath 11/15/2016   a. LHC 11/15/16 showed normal coronaries and EF 50-55% with normal EDP. (done for false positive NST)  . Hx of colonic polyps 1995   adenomatous  . Hyperlipidemia   . HYPERLIPIDEMIA 11/04/2007  . Hypertension   . HYPERTENSION 06/07/2009  . LBBB (left bundle branch block) 07/26/2011  . Personal history of radiation therapy   . S/P radiation therapy 02/02/15-03/24/15   left breast 60.4GY    PAST SURGICAL HISTORY: Past Surgical History:  Procedure Laterality Date  . ABDOMINAL HYSTERECTOMY    . ANTERIOR FUSION CERVICAL SPINE    . AUGMENTATION MAMMAPLASTY Bilateral   . bilat ear surgury/hearing loss    . BREAST LUMPECTOMY Left 2016  . BREAST LUMPECTOMY WITH RADIOACTIVE SEED LOCALIZATION Left 12/28/2014   Procedure: BREAST LUMPECTOMY WITH RADIOACTIVE SEED LOCALIZATION;  Surgeon: Autumn Messing III, MD;  Location: [facility];  Service: General;  Laterality: Left;  . COLONOSCOPY    . LEFT HEART CATH AND CORONARY ANGIOGRAPHY N/A 11/15/2016   Procedure: LEFT HEART CATH AND CORONARY ANGIOGRAPHY;  Surgeon: Belva Crome, MD;  Location: [facility];  Service: Cardiovascular;  Laterality: N/A;  . POLYPECTOMY      FAMILY HISTORY: Family History  Problem Relation Age of Onset  . Breast cancer Other   . Cancer Paternal Grandmother        breast  . Lung cancer Father   . Breast cancer Maternal Grandmother   . Healthy Mother   . Coronary artery disease Neg Hx   . Colon cancer Neg Hx   . Pancreatic cancer Neg Hx   . Rectal cancer Neg Hx   . Stomach cancer Neg Hx   . Esophageal cancer Neg Hx    [name] died from lung cancer in the setting of tobacco abuse at age 51. [name] died from "old age" at age 10. The patient had  one brother, 3 sisters. The brother had" I cancer" requiring enucleation--the patient does not know whether this was a primary ocular melanoma. [name] was diagnosed with breast cancer but the patient does not know at what age. There is no other history of breast or ovarian cancer in the family   GYNECOLOGIC HISTORY:  No LMP recorded. Patient has had a hysterectomy. Menarche age 40, first live birth age 10. The patient is GX P3. She is status post hysterectomy without salpingo-oophorectomy. She did not take hormone replacement. She did use oral contraceptives remotely without any complications.   SOCIAL HISTORY:  Joshlynn works in accounts payable. Her husband [name] is disabled because of back problems. [name] is a Marine scientist working in rehabilitation in [place]. [name] lives in [place] where he works as a Games developer. [name] lives in [place] where he works as a Emergency planning/management officer. The patient has 4 grandchildren. She is not a Ambulance person.    ADVANCED DIRECTIVES: Not in place  HEALTH MAINTENANCE: Social History   Tobacco Use  . Smoking status: Former Smoker    Packs/day: 1.00    Years: 20.00    Pack years: 20.00    Types: Cigarettes    Quit date: 08/19/1989    Years since quitting: 29.1  . Smokeless tobacco: Never Used  Substance Use Topics  . Alcohol use: No  . Drug use: No    Colonoscopy:  PAP:  Bone density:  Lipid panel:  Allergies  Allergen Reactions  . Codeine     REACTION: nausea,  . Tramadol Hcl Nausea Only    Current Outpatient Medications  Medication Sig Dispense Refill  . amLODipine (NORVASC) 2.5 MG tablet Take 1 tablet (2.5 mg total) by mouth daily. 90 tablet 3  . aspirin EC 81 MG tablet Take 81 mg by mouth daily.    Marland Kitchen azithromycin (ZITHROMAX) 250 MG tablet Take as directed 6 each 0  . benzonatate (TESSALON) 100 MG capsule Take 1 capsule (100 mg total) by mouth 3 (three) times daily as needed for cough.  60 capsule 1  . cholecalciferol (VITAMIN D3) 25 MCG (1000 UT) tablet Take 1 tablet (1,000 Units total) by mouth daily.    . COMBIVENT RESPIMAT 20-100 MCG/ACT AERS respimat INHALE 1 PUFF BY MOUTH EVERY 6 HOURS 4 g 0  . diazepam (VALIUM) 5 MG tablet     . doxycycline (VIBRA-TABS) 100 MG tablet     . fluconazole (DIFLUCAN) 100 MG tablet     . HYDROcodone-acetaminophen (NORCO) 7.5-325 MG tablet     . IBRANCE 125 MG tablet TAKE 1 TABLET BY MOUTH ONCE DAILY WITH OR WITHOUT FOOD  FOR 21 DAYS, FOLLOWED BY 7  DAYS OFF, REPEATED EVERY 28 DAYS 21 tablet 1  . losartan (COZAAR) 100 MG tablet Take 1 tablet (100 mg total) by mouth daily. 90 tablet 3  . morphine 10 MG/5ML solution Take 1.3-2.5 mLs (2.6-5 mg total) by mouth every 6 (six) hours as needed for severe pain. 30 mL 0  . nystatin (MYCOSTATIN) 100000 UNIT/ML suspension     . ondansetron (ZOFRAN) 8 MG tablet Take 1 tablet (8 mg total) by mouth every 8 (eight) hours as needed for nausea or vomiting. 20 tablet 0  . palbociclib (IBRANCE) 125 MG capsule Take 1 capsule (125 mg total) by mouth daily. Take after lunch. Take for 21 days on, 7 days off, repeat every 28 days. 21 capsule 6  . pantoprazole (PROTONIX) 20 MG tablet Take 1 tablet (20 mg total) by mouth daily. 30 tablet 0  . prochlorperazine (COMPAZINE) 25 MG suppository Place 1 suppository (25 mg total) rectally every 12 (twelve) hours as needed for nausea or vomiting. 12 suppository 1  . promethazine (PHENERGAN) 25 MG suppository Place 1 suppository (25 mg total) rectally every 6 (six) hours as needed for nausea or vomiting. 12 each 0  . Spacer/Aero-Holding Chambers DEVI Spacer to be used with inhaler 1 each 0  . sucralfate (CARAFATE) 1 g tablet Take 1 tablet (1 g total) by mouth 4 (four) times daily. 120 tablet 1  . traMADol (ULTRAM) 50 MG tablet     . valACYclovir (VALTREX) 500 MG tablet Take 1 tablet (500 mg total) by mouth 2 (two) times daily. 60 tablet 1  . alum & mag hydroxide-simeth (MAALOX  PLUS) 400-400-40 MG/5ML suspension Take 15 mLs by mouth every 6 (six) hours as needed for indigestion. (Patient not taking: Reported on 10/13/2018) 355 mL 0   No current facility-administered medications for this visit.  OBJECTIVE:  Vitals:   10/13/18 0846  BP: 113/61  Pulse: 81  Resp: 18  Temp: 98.5 F (36.9 C)  SpO2: 100%     Body mass index is 29.82 kg/m.    ECOG FS:1 - Symptomatic but completely ambulatory  Filed Weights   10/13/18 0846  Weight: 179 lb 3.2 oz (81.3 kg)  GENERAL: Patient is a well appearing female in no acute distress HEENT:  Sclerae anicteric.  Oropharynx clear and moist.  Ulcer noted on left inner lower gum line. No evidence of oropharyngeal candidiasis. Neck is supple. No sinus tenderness NODES:  No cervical, supraclavicular, or axillary lymphadenopathy palpated.  BREAST EXAM:  Deferred. LUNGS:  Clear to auscultation bilaterally.  No wheezes or rhonchi. HEART:  Regular rate and rhythm. No murmur appreciated. ABDOMEN:  Soft, nontender.  Positive, normoactive bowel sounds. No organomegaly palpated. MSK:  No focal spinal tenderness to palpation. Full range of motion bilaterally in the upper extremities. EXTREMITIES:  No peripheral edema.   SKIN:  Clear with no obvious rashes or skin changes. No nail dyscrasia. NEURO:  Nonfocal. Well oriented.  Appropriate affect.   LAB RESULTS:  CMP     Component Value Date/Time   NA 144 09/15/2018 0914   NA 141 04/25/2017 1213   NA 145 07/04/2016 1133   K 4.5 09/15/2018 0914   K 4.2 07/04/2016 1133   CL 108 09/15/2018 0914   CO2 27 09/15/2018 0914   CO2 28 07/04/2016 1133   GLUCOSE 108 (H) 09/15/2018 0914   GLUCOSE 101 07/04/2016 1133   BUN 16 09/15/2018 0914   BUN 18 04/25/2017 1213   BUN 16.0 07/04/2016 1133   CREATININE 1.41 (H) 09/15/2018 0914   CREATININE 1.09 (H) 07/23/2018 1339   CREATININE 0.9 07/04/2016 1133   CALCIUM 9.1 09/15/2018 0914   CALCIUM 9.9 07/04/2016 1133   PROT 7.0 09/15/2018 0914    PROT 7.3 07/04/2016 1133   ALBUMIN 3.5 09/15/2018 0914   ALBUMIN 3.8 07/04/2016 1133   AST 28 09/15/2018 0914   AST 35 07/23/2018 1339   AST 19 07/04/2016 1133   ALT 17 09/15/2018 0914   ALT 25 07/23/2018 1339   ALT 17 07/04/2016 1133   ALKPHOS 95 09/15/2018 0914   ALKPHOS 172 (H) 07/04/2016 1133   BILITOT 1.9 (H) 09/15/2018 0914   BILITOT 1.1 07/23/2018 1339   BILITOT 0.94 07/04/2016 1133   GFRNONAA 40 (L) 09/15/2018 0914   GFRNONAA 55 (L) 07/23/2018 1339   GFRAA 46 (L) 09/15/2018 0914   GFRAA >60 07/23/2018 1339    INo results found for: SPEP, UPEP  Lab Results  Component Value Date   WBC 2.5 (L) 10/13/2018   NEUTROABS 1.4 (L) 10/13/2018   HGB 11.7 (L) 10/13/2018   HCT 34.2 (L) 10/13/2018   MCV 104.0 (H) 10/13/2018   PLT 228 10/13/2018      Chemistry      Component Value Date/Time   NA 144 09/15/2018 0914   NA 141 04/25/2017 1213   NA 145 07/04/2016 1133   K 4.5 09/15/2018 0914   K 4.2 07/04/2016 1133   CL 108 09/15/2018 0914   CO2 27 09/15/2018 0914   CO2 28 07/04/2016 1133   BUN 16 09/15/2018 0914   BUN 18 04/25/2017 1213   BUN 16.0 07/04/2016 1133   CREATININE 1.41 (H) 09/15/2018 0914   CREATININE 1.09 (H) 07/23/2018 1339   CREATININE 0.9 07/04/2016 1133      Component Value Date/Time   CALCIUM 9.1 09/15/2018  0914   CALCIUM 9.9 07/04/2016 1133   ALKPHOS 95 09/15/2018 0914   ALKPHOS 172 (H) 07/04/2016 1133   AST 28 09/15/2018 0914   AST 35 07/23/2018 1339   AST 19 07/04/2016 1133   ALT 17 09/15/2018 0914   ALT 25 07/23/2018 1339   ALT 17 07/04/2016 1133   BILITOT 1.9 (H) 09/15/2018 0914   BILITOT 1.1 07/23/2018 1339   BILITOT 0.94 07/04/2016 1133     No results found for: LABCA2  No components found for: LABCA125  No results for input(s): INR in the last 168 hours.  Urinalysis    Component Value Date/Time   COLORURINE YELLOW 07/20/2018 [place] 07/20/2018 1747   LABSPEC 1.019 07/20/2018 1747   PHURINE 5.0 07/20/2018  1747   GLUCOSEU NEGATIVE 07/20/2018 1747   GLUCOSEU NEGATIVE 10/20/2017 1303   HGBUR NEGATIVE 07/20/2018 1747   BILIRUBINUR NEGATIVE 07/20/2018 1747   KETONESUR 20 (A) 07/20/2018 1747   PROTEINUR NEGATIVE 07/20/2018 1747   UROBILINOGEN 1.0 10/20/2017 1303   NITRITE NEGATIVE 07/20/2018 1747   LEUKOCYTESUR NEGATIVE 07/20/2018 1747    STUDIES: No results found.   ASSESSMENT: 64 y.o. [place] woman status post left lumpectomy 12/28/2014 for ductal carcinoma in situ, high-grade measuring 0.2 cm, estrogen and progesterone receptor positive, with close but negative margins  (1) adjuvant radiation completed 03/24/2015  (2) anastrozole started 04/19/2015   (a) bone density 03/01/2016 shows a T score of -1.2  (3) mildly abnormal hepatic function panel: negative workup for hepatitis B and C, normal alpha-1-anti-trypsin  (4) left lower extremity DVT diagnosed by Doppler ultrasonography 04/16/2016, with negative CT angiogram chest  (a) on Xarelto starting 04/16/2016   (b) negative hypercoagulable panel, with a minimally abnormal (indeterminate) anti-cardiolipin IgM  ((c) stopped rivaroxaban 07/16/2016, repeat D-Dimer WNL August 2018  METASTATIC DISEASE: December 2019: bone only (5) CT chest 02/06/2018 shows a mixed lytic and sclerotic process with fracture at the inferior manubrium but no other bone lesions.  However bone scan 02/16/2018 shows multiple bone lesions consistent with metastatic disease; no lung or pleura involvement, no adenopathy  (a) Bone marrow biopsy on 03/03/2018 shows metastatic carcinoma consistent with breast primary, estrogen and progesterone receptor strongly positive, HER-2 not amplified (1+)  (b) MRI abdomen and MRI brain at [facility] (results viewable in "care everywhere") notes known bone involvement in spine and pelvis, but no other area of metastatic disease in abdomen/pelvis, or brain.  (c) CA 27.29 not informative (37.9 on 03/03/2018)  (6) started  denosumab/Xgeva on 03/18/2018, repeated Q28 d  (7) started fulvestrant on 03/03/2018 and palbociclib 03/18/2018 at 125 mg/d 21/7  (a) bone scan 06/10/2018 serves as our new baseline  (8) palliative radiation completed 07/14/2018  (a) esophagitis secondary to radiation has resolved   PLAN: Caitlin Wall is doing well today.  She is clinically without any signs of progression of her metastatic breast cancer.  Her CBC is stable, and she will complete her current cycle of Palbociclib mid next week.  Tracy will continue to receive Fulvestrant and Xgeva every four weeks.  Shina is scheduled for restaging scans on 11/03/2018.    Shandrell has a healing ulcer on her lower inner gum line.  I refilled her Valtrex so she can take that BID to help with this.  Charita and I discussed her pain in her sinus area around her eye.  Since she doesn't have nasal drainage, and sinus pressure, we will monitor this area.  She has recently been on two  antibiotics.  I counseled her that if the pain continues or worsens to call me and let me know, as we may need to treat her for sinusitis with augmentin, however there is a risk of her developing C diff since she has been on several other antibiotics recently, so we will monitor closely.    Emersyn and I reviewed calcium and vitamin D intake.  She is going to take extra calcium today.  I gave her a handout in her AVS that details this.    Jeane will return in 4 weeks for labs, f/u, and injections.  She was recommended to continue with the appropriate pandemic precautions. She knows to call for any questions that may arise between now and her next appointment.  We are happy to see her sooner if needed.  A total of (30) minutes of face-to-face time was spent with this patient with greater than 50% of that time in counseling and care-coordination.   Wilber Bihari, NP  10/13/18 9:03 AM Medical Oncology and Hematology [address], [place] [address] Tel.  [phone]    Fax. [phone]

## 2018-10-13 NOTE — Patient Instructions (Signed)
Denosumab injection What is this medicine? DENOSUMAB (den oh sue mab) slows bone breakdown. Prolia is used to treat osteoporosis in women after menopause and in men, and in people who are taking corticosteroids for 6 months or more. Xgeva is used to treat a high calcium level due to cancer and to prevent bone fractures and other bone problems caused by multiple myeloma or cancer bone metastases. Xgeva is also used to treat giant cell tumor of the bone. This medicine may be used for other purposes; ask your health care provider or pharmacist if you have questions. COMMON BRAND NAME(S): Prolia, XGEVA What should I tell my health care provider before I take this medicine? They need to know if you have any of these conditions:  dental disease  having surgery or tooth extraction  infection  kidney disease  low levels of calcium or Vitamin D in the blood  malnutrition  on hemodialysis  skin conditions or sensitivity  thyroid or parathyroid disease  an unusual reaction to denosumab, other medicines, foods, dyes, or preservatives  pregnant or trying to get pregnant  breast-feeding How should I use this medicine? This medicine is for injection under the skin. It is given by a health care professional in a hospital or clinic setting. A special MedGuide will be given to you before each treatment. Be sure to read this information carefully each time. For Prolia, talk to your pediatrician regarding the use of this medicine in children. Special care may be needed. For Xgeva, talk to your pediatrician regarding the use of this medicine in children. While this drug may be prescribed for children as young as 13 years for selected conditions, precautions do apply. Overdosage: If you think you have taken too much of this medicine contact a poison control center or emergency room at once. NOTE: This medicine is only for you. Do not share this medicine with others. What if I miss a dose? It is  important not to miss your dose. Call your doctor or health care professional if you are unable to keep an appointment. What may interact with this medicine? Do not take this medicine with any of the following medications:  other medicines containing denosumab This medicine may also interact with the following medications:  medicines that lower your chance of fighting infection  steroid medicines like prednisone or cortisone This list may not describe all possible interactions. Give your health care provider a list of all the medicines, herbs, non-prescription drugs, or dietary supplements you use. Also tell them if you smoke, drink alcohol, or use illegal drugs. Some items may interact with your medicine. What should I watch for while using this medicine? Visit your doctor or health care professional for regular checks on your progress. Your doctor or health care professional may order blood tests and other tests to see how you are doing. Call your doctor or health care professional for advice if you get a fever, chills or sore throat, or other symptoms of a cold or flu. Do not treat yourself. This drug may decrease your body's ability to fight infection. Try to avoid being around people who are sick. You should make sure you get enough calcium and vitamin D while you are taking this medicine, unless your doctor tells you not to. Discuss the foods you eat and the vitamins you take with your health care professional. See your dentist regularly. Brush and floss your teeth as directed. Before you have any dental work done, tell your dentist you are   receiving this medicine. Do not become pregnant while taking this medicine or for 5 months after stopping it. Talk with your doctor or health care professional about your birth control options while taking this medicine. Women should inform their doctor if they wish to become pregnant or think they might be pregnant. There is a potential for serious side  effects to an unborn child. Talk to your health care professional or pharmacist for more information. What side effects may I notice from receiving this medicine? Side effects that you should report to your doctor or health care professional as soon as possible:  allergic reactions like skin rash, itching or hives, swelling of the face, lips, or tongue  bone pain  breathing problems  dizziness  jaw pain, especially after dental work  redness, blistering, peeling of the skin  signs and symptoms of infection like fever or chills; cough; sore throat; pain or trouble passing urine  signs of low calcium like fast heartbeat, muscle cramps or muscle pain; pain, tingling, numbness in the hands or feet; seizures  unusual bleeding or bruising  unusually weak or tired Side effects that usually do not require medical attention (report to your doctor or health care professional if they continue or are bothersome):  constipation  diarrhea  headache  joint pain  loss of appetite  muscle pain  runny nose  tiredness  upset stomach This list may not describe all possible side effects. Call your doctor for medical advice about side effects. You may report side effects to FDA at 1-800-FDA-1088. Where should I keep my medicine? This medicine is only given in a clinic, doctor's office, or other health care setting and will not be stored at home. NOTE: This sheet is a summary. It may not cover all possible information. If you have questions about this medicine, talk to your doctor, pharmacist, or health care provider.  2020 [employer] Standard (2017-06-20 16:10:44) Fulvestrant injection What is this medicine? FULVESTRANT (ful VES trant) blocks the effects of estrogen. It is used to treat breast cancer. This medicine may be used for other purposes; ask your health care provider or pharmacist if you have questions. COMMON BRAND NAME(S): FASLODEX What should I tell my health care  provider before I take this medicine? They need to know if you have any of these conditions:  bleeding disorders  liver disease  low blood counts, like low white cell, platelet, or red cell counts  an unusual or allergic reaction to fulvestrant, other medicines, foods, dyes, or preservatives  pregnant or trying to get pregnant  breast-feeding How should I use this medicine? This medicine is for injection into a muscle. It is usually given by a health care professional in a hospital or clinic setting. Talk to your pediatrician regarding the use of this medicine in children. Special care may be needed. Overdosage: If you think you have taken too much of this medicine contact a poison control center or emergency room at once. NOTE: This medicine is only for you. Do not share this medicine with others. What if I miss a dose? It is important not to miss your dose. Call your doctor or health care professional if you are unable to keep an appointment. What may interact with this medicine?  medicines that treat or prevent blood clots like warfarin, enoxaparin, dalteparin, apixaban, dabigatran, and rivaroxaban This list may not describe all possible interactions. Give your health care provider a list of all the medicines, herbs, non-prescription drugs, or dietary supplements you use.   Also tell them if you smoke, drink alcohol, or use illegal drugs. Some items may interact with your medicine. What should I watch for while using this medicine? Your condition will be monitored carefully while you are receiving this medicine. You will need important blood work done while you are taking this medicine. Do not become pregnant while taking this medicine or for at least 1 year after stopping it. Women of child-bearing potential will need to have a negative pregnancy test before starting this medicine. Women should inform their doctor if they wish to become pregnant or think they might be pregnant. There is  a potential for serious side effects to an unborn child. Men should inform their doctors if they wish to father a child. This medicine may lower sperm counts. Talk to your health care professional or pharmacist for more information. Do not breast-feed an infant while taking this medicine or for 1 year after the last dose. What side effects may I notice from receiving this medicine? Side effects that you should report to your doctor or health care professional as soon as possible:  allergic reactions like skin rash, itching or hives, swelling of the face, lips, or tongue  feeling faint or lightheaded, falls  pain, tingling, numbness, or weakness in the legs  signs and symptoms of infection like fever or chills; cough; flu-like symptoms; sore throat  vaginal bleeding Side effects that usually do not require medical attention (report to your doctor or health care professional if they continue or are bothersome):  aches, pains  constipation  diarrhea  headache  hot flashes  nausea, vomiting  pain at site where injected  stomach pain This list may not describe all possible side effects. Call your doctor for medical advice about side effects. You may report side effects to FDA at 1-800-FDA-1088. Where should I keep my medicine? This drug is given in a hospital or clinic and will not be stored at home. NOTE: This sheet is a summary. It may not cover all possible information. If you have questions about this medicine, talk to your doctor, pharmacist, or health care provider.  2020 [employer] Standard (2017-05-22 11:34:41)  

## 2018-10-13 NOTE — Patient Instructions (Signed)

## 2018-10-16 ENCOUNTER — Other Ambulatory Visit: Payer: Self-pay | Admitting: Oncology

## 2018-10-20 ENCOUNTER — Encounter: Payer: Self-pay | Admitting: Gastroenterology

## 2018-11-04 ENCOUNTER — Encounter (HOSPITAL_COMMUNITY): Payer: 59

## 2018-11-04 ENCOUNTER — Ambulatory Visit (HOSPITAL_COMMUNITY): Admission: RE | Admit: 2018-11-04 | Payer: 59 | Source: Ambulatory Visit

## 2018-11-05 ENCOUNTER — Ambulatory Visit (AMBULATORY_SURGERY_CENTER): Payer: Self-pay | Admitting: *Deleted

## 2018-11-05 ENCOUNTER — Other Ambulatory Visit: Payer: Self-pay

## 2018-11-05 VITALS — Temp 97.7°F | Ht 63.0 in | Wt 177.4 lb

## 2018-11-05 DIAGNOSIS — Z86010 Personal history of colonic polyps: Secondary | ICD-10-CM

## 2018-11-05 MED ORDER — SUPREP BOWEL PREP KIT 17.5-3.13-1.6 GM/177ML PO SOLN: 1.0000 | Freq: Once | ORAL | 0 refills | Status: AC

## 2018-11-05 NOTE — Progress Notes (Signed)
Patient denies any allergies to egg or soy products. Patient denies complications with anesthesia/sedation.  Patient denies oxygen use at home and denies diet medications. Emmi instructions for colonoscopy explained and given to patient. Suprep coupon given.

## 2018-11-06 ENCOUNTER — Encounter: Payer: Self-pay | Admitting: Gastroenterology

## 2018-11-10 ENCOUNTER — Ambulatory Visit: Payer: 59 | Admitting: Adult Health

## 2018-11-10 ENCOUNTER — Ambulatory Visit: Payer: 59

## 2018-11-10 ENCOUNTER — Other Ambulatory Visit: Payer: 59

## 2018-11-11 ENCOUNTER — Other Ambulatory Visit: Payer: Self-pay

## 2018-11-11 ENCOUNTER — Encounter (HOSPITAL_COMMUNITY): Payer: Self-pay

## 2018-11-11 ENCOUNTER — Encounter (HOSPITAL_COMMUNITY)
Admission: RE | Admit: 2018-11-11 | Discharge: 2018-11-11 | Disposition: A | Discharge status: Home / Self Care | Payer: 59 | Source: Ambulatory Visit | Attending: Oncology | Admitting: Oncology

## 2018-11-11 ENCOUNTER — Other Ambulatory Visit: Payer: Self-pay | Admitting: Oncology

## 2018-11-11 ENCOUNTER — Ambulatory Visit (HOSPITAL_COMMUNITY)
Admission: RE | Admit: 2018-11-11 | Discharge: 2018-11-11 | Disposition: A | Discharge status: Home / Self Care | Payer: 59 | Source: Ambulatory Visit | Attending: Oncology | Admitting: Oncology

## 2018-11-11 DIAGNOSIS — C7951 Secondary malignant neoplasm of bone: Secondary | ICD-10-CM | POA: Diagnosis present

## 2018-11-11 DIAGNOSIS — C50512 Malignant neoplasm of lower-outer quadrant of left female breast: Secondary | ICD-10-CM | POA: Insufficient documentation

## 2018-11-11 DIAGNOSIS — Z170 Estrogen receptor positive status [ER+]: Secondary | ICD-10-CM | POA: Insufficient documentation

## 2018-11-11 MED ORDER — IOHEXOL 300 MG/ML  SOLN
75.0000 mL | Freq: Once | INTRAMUSCULAR | Status: AC | PRN
  Administered 2018-11-11: 75 mL via INTRAVENOUS

## 2018-11-11 MED ORDER — TECHNETIUM TC 99M MEDRONATE IV KIT
21.7000 | PACK | Freq: Once | INTRAVENOUS | Status: AC
  Administered 2018-11-11: 21.7 via INTRAVENOUS

## 2018-11-11 MED ORDER — SODIUM CHLORIDE (PF) 0.9 % IJ SOLN
INTRAMUSCULAR | Status: AC
  Filled 2018-11-11: qty 50

## 2018-11-11 NOTE — Progress Notes (Signed)
I called Caitlin Wall and let her know that all we are really seeing is a lot of bone spots which are healing.  This is good news.

## 2018-11-16 ENCOUNTER — Inpatient Hospital Stay (HOSPITAL_BASED_OUTPATIENT_CLINIC_OR_DEPARTMENT_OTHER): Payer: 59 | Admitting: Adult Health

## 2018-11-16 ENCOUNTER — Inpatient Hospital Stay: Payer: 59

## 2018-11-16 ENCOUNTER — Ambulatory Visit (HOSPITAL_COMMUNITY)
Admission: RE | Admit: 2018-11-16 | Discharge: 2018-11-16 | Disposition: A | Discharge status: Home / Self Care | Payer: 59 | Source: Ambulatory Visit | Attending: Adult Health | Admitting: Adult Health

## 2018-11-16 ENCOUNTER — Other Ambulatory Visit: Payer: Self-pay | Admitting: Adult Health

## 2018-11-16 ENCOUNTER — Other Ambulatory Visit: Payer: Self-pay

## 2018-11-16 ENCOUNTER — Inpatient Hospital Stay: Payer: 59 | Attending: Oncology

## 2018-11-16 VITALS — BP 118/67 | HR 83 | Temp 97.4°F | Resp 18 | Ht 63.0 in | Wt 176.3 lb

## 2018-11-16 DIAGNOSIS — I824Z2 Acute embolism and thrombosis of unspecified deep veins of left distal lower extremity: Secondary | ICD-10-CM

## 2018-11-16 DIAGNOSIS — Z885 Allergy status to narcotic agent status: Secondary | ICD-10-CM | POA: Diagnosis not present

## 2018-11-16 DIAGNOSIS — I700 Atherosclerosis of aorta: Secondary | ICD-10-CM | POA: Insufficient documentation

## 2018-11-16 DIAGNOSIS — Z87891 Personal history of nicotine dependence: Secondary | ICD-10-CM | POA: Insufficient documentation

## 2018-11-16 DIAGNOSIS — C7951 Secondary malignant neoplasm of bone: Secondary | ICD-10-CM | POA: Insufficient documentation

## 2018-11-16 DIAGNOSIS — K449 Diaphragmatic hernia without obstruction or gangrene: Secondary | ICD-10-CM | POA: Insufficient documentation

## 2018-11-16 DIAGNOSIS — R0602 Shortness of breath: Secondary | ICD-10-CM | POA: Insufficient documentation

## 2018-11-16 DIAGNOSIS — Z170 Estrogen receptor positive status [ER+]: Secondary | ICD-10-CM | POA: Diagnosis present

## 2018-11-16 DIAGNOSIS — C50512 Malignant neoplasm of lower-outer quadrant of left female breast: Secondary | ICD-10-CM

## 2018-11-16 DIAGNOSIS — Z7901 Long term (current) use of anticoagulants: Secondary | ICD-10-CM | POA: Diagnosis not present

## 2018-11-16 DIAGNOSIS — Z8719 Personal history of other diseases of the digestive system: Secondary | ICD-10-CM | POA: Insufficient documentation

## 2018-11-16 DIAGNOSIS — Z86718 Personal history of other venous thrombosis and embolism: Secondary | ICD-10-CM | POA: Insufficient documentation

## 2018-11-16 DIAGNOSIS — Z803 Family history of malignant neoplasm of breast: Secondary | ICD-10-CM | POA: Insufficient documentation

## 2018-11-16 DIAGNOSIS — Z5111 Encounter for antineoplastic chemotherapy: Secondary | ICD-10-CM | POA: Insufficient documentation

## 2018-11-16 DIAGNOSIS — R5383 Other fatigue: Secondary | ICD-10-CM | POA: Diagnosis not present

## 2018-11-16 DIAGNOSIS — R000 Tachycardia, unspecified: Secondary | ICD-10-CM | POA: Diagnosis not present

## 2018-11-16 DIAGNOSIS — Z90710 Acquired absence of both cervix and uterus: Secondary | ICD-10-CM | POA: Insufficient documentation

## 2018-11-16 DIAGNOSIS — R0781 Pleurodynia: Secondary | ICD-10-CM | POA: Insufficient documentation

## 2018-11-16 DIAGNOSIS — Z79899 Other long term (current) drug therapy: Secondary | ICD-10-CM | POA: Insufficient documentation

## 2018-11-16 DIAGNOSIS — Z801 Family history of malignant neoplasm of trachea, bronchus and lung: Secondary | ICD-10-CM | POA: Diagnosis not present

## 2018-11-16 DIAGNOSIS — R0682 Tachypnea, not elsewhere classified: Secondary | ICD-10-CM | POA: Insufficient documentation

## 2018-11-16 DIAGNOSIS — I10 Essential (primary) hypertension: Secondary | ICD-10-CM | POA: Diagnosis not present

## 2018-11-16 DIAGNOSIS — Z923 Personal history of irradiation: Secondary | ICD-10-CM | POA: Insufficient documentation

## 2018-11-16 DIAGNOSIS — H9193 Unspecified hearing loss, bilateral: Secondary | ICD-10-CM | POA: Insufficient documentation

## 2018-11-16 LAB — CBC WITH DIFFERENTIAL/PLATELET
Abs Immature Granulocytes: 0.01 10*3/uL (ref 0.00–0.07)
Basophils Absolute: 0 10*3/uL (ref 0.0–0.1)
Basophils Relative: 1 %
Eosinophils Absolute: 0.3 10*3/uL (ref 0.0–0.5)
Eosinophils Relative: 12 %
HCT: 34.1 % — ABNORMAL LOW (ref 36.0–46.0)
Hemoglobin: 11.8 g/dL — ABNORMAL LOW (ref 12.0–15.0)
Immature Granulocytes: 0 %
Lymphocytes Relative: 15 %
Lymphs Abs: 0.4 10*3/uL — ABNORMAL LOW (ref 0.7–4.0)
MCH: 34.9 pg — ABNORMAL HIGH (ref 26.0–34.0)
MCHC: 34.6 g/dL (ref 30.0–36.0)
MCV: 100.9 fL — ABNORMAL HIGH (ref 80.0–100.0)
Monocytes Absolute: 0.2 10*3/uL (ref 0.1–1.0)
Monocytes Relative: 8 %
Neutro Abs: 1.6 10*3/uL — ABNORMAL LOW (ref 1.7–7.7)
Neutrophils Relative %: 64 %
Platelets: 174 10*3/uL (ref 150–400)
RBC: 3.38 MIL/uL — ABNORMAL LOW (ref 3.87–5.11)
RDW: 14.9 % (ref 11.5–15.5)
WBC: 2.5 10*3/uL — ABNORMAL LOW (ref 4.0–10.5)
nRBC: 0 % (ref 0.0–0.2)

## 2018-11-16 LAB — COMPREHENSIVE METABOLIC PANEL
ALT: 23 U/L (ref 0–44)
AST: 34 U/L (ref 15–41)
Albumin: 3.3 g/dL — ABNORMAL LOW (ref 3.5–5.0)
Alkaline Phosphatase: 114 U/L (ref 38–126)
Anion gap: 8 (ref 5–15)
BUN: 16 mg/dL (ref 8–23)
CO2: 27 mmol/L (ref 22–32)
Calcium: 9.7 mg/dL (ref 8.9–10.3)
Chloride: 108 mmol/L (ref 98–111)
Creatinine, Ser: 1.2 mg/dL — ABNORMAL HIGH (ref 0.44–1.00)
GFR calc Af Amer: 56 mL/min — ABNORMAL LOW (ref >60)
GFR calc non Af Amer: 49 mL/min — ABNORMAL LOW (ref >60)
Glucose, Bld: 98 mg/dL (ref 70–99)
Potassium: 4.2 mmol/L (ref 3.5–5.1)
Sodium: 143 mmol/L (ref 135–145)
Total Bilirubin: 0.9 mg/dL (ref 0.3–1.2)
Total Protein: 6.9 g/dL (ref 6.5–8.1)

## 2018-11-16 MED ORDER — DENOSUMAB 120 MG/1.7ML ~~LOC~~ SOLN
120.0000 mg | Freq: Once | SUBCUTANEOUS | Status: AC
  Administered 2018-11-16: 120 mg via SUBCUTANEOUS

## 2018-11-16 MED ORDER — FULVESTRANT 250 MG/5ML IM SOLN
INTRAMUSCULAR | Status: AC
  Filled 2018-11-16: qty 10

## 2018-11-16 MED ORDER — SODIUM CHLORIDE (PF) 0.9 % IJ SOLN
INTRAMUSCULAR | Status: AC
  Filled 2018-11-16: qty 50

## 2018-11-16 MED ORDER — DENOSUMAB 120 MG/1.7ML ~~LOC~~ SOLN
SUBCUTANEOUS | Status: AC
  Filled 2018-11-16: qty 1.7

## 2018-11-16 MED ORDER — FULVESTRANT 250 MG/5ML IM SOLN
INTRAMUSCULAR | Status: AC
  Filled 2018-11-16: qty 5

## 2018-11-16 MED ORDER — IOHEXOL 350 MG/ML SOLN
100.0000 mL | Freq: Once | INTRAVENOUS | Status: AC | PRN
  Administered 2018-11-16: 100 mL via INTRAVENOUS

## 2018-11-16 MED ORDER — FULVESTRANT 250 MG/5ML IM SOLN
500.0000 mg | Freq: Once | INTRAMUSCULAR | Status: AC
  Administered 2018-11-16: 500 mg via INTRAMUSCULAR

## 2018-11-16 MED ORDER — ONDANSETRON HCL 4 MG/2ML IJ SOLN: 8.0000 mg | Freq: Once | INTRAMUSCULAR | Status: DC

## 2018-11-16 NOTE — Progress Notes (Signed)
SATURATION QUALIFICATIONS: (This note is used to comply with regulatory documentation for home oxygen)  Patient Saturations on Room Air at Rest = 91%  Patient Saturations on Room Air while Ambulating = 95%  Patient Saturations on 0 Liters of oxygen while Ambulating = 95%

## 2018-11-16 NOTE — Progress Notes (Signed)
[facility]  [phone] [phone]) [phone]    ID: Sheilyn Boehlke Wall DOB: [birth year]  MR#: [id number]  QIO#:[id number]  Patient Care Team: Biagio Borg, MD as PCP - General Tamala Julian Lynnell Dike, MD as PCP - Cardiology (Cardiology) Magrinat, Virgie Dad, MD as Consulting Physician (Oncology) Kyung Rudd, MD as Consulting Physician (Radiation Oncology) Jovita Kussmaul, MD as Consulting Physician (General Surgery) Sylvan Cheese, NP as Nurse Practitioner (Hematology and Oncology) OTHER MD: Keturah Barre MD   CHIEF COMPLAINT: Metastatic estrogen receptor positive breast cancer  CURRENT TREATMENT: Denosumab/Xgeva, fluvestrant, palbociclib   INTERVAL HISTORY: Caitlin Wall returns today for follow-up and treatment of her metastatic estrogen receptor positive breast cancer.   She continues on denosumab/Xgeva, with her most recent dose received on 09/15/2018.  She tolerates this well, has no dental/oral issues, and takes extra calcium on day of injection.   She also continues on fulvestrant, with her most recent dose received on 09/15/2018.  She tolerates this well.   She also continues on palbociclib/Ibrance. She tolerates this well and without any noticeable side effects.   She is scheduled to stop on 11/19/2018.  She had an oral ulcer at her last visit, and we refilled her valtrex.  That has since resolved.   Since her last visit she underwent bone scan on 11/12/2018 that showed unchanged uptake in the ribs bilaterally and and in the inferior manubrium, in addition the parietal bone.  She also underwent CT chest on 11/11/2018 that showed the disease in the bone, but no other areas of concern for metastases.    REVIEW OF SYSTEMS: Caitlin Wall has rib pain.  She takes Morphine 1.'3mg'$  about once a day to help with the pain. This is typically at night.  PMP aware reviewed.  This was last filled on 07/31/2018 and she says she doesn't need refills right now.  Clotile has mild fatigue.  She  is very concerned because she has developed an increasing amount of shortness of breath, particularly on exertion.  She notes she also will get tachycardic.  This makes her feel anxious when this happens.  She says this started after her CT chest.  She denies orthopnea, lower extremity swelling, fever, chills, wheezing, cough, nasal drainage.  She is worried she might have a PE.  She says the inhaler will help when she uses it.  Otherwise, a detailed ROS was non contributory today.  BREAST CANCER HISTORY:  From the original intake note:   Caitlin Wall [facility]. She has subpectoral saline implants in place. There was a possible asymmetry in the left breast and she was recalled for left diagnostic mammography with tomosynthesis and left breast ultrasonography 11/03/2014. The breast density was category C. In the left breast lower outer quadrant there was an irregular mass which was not palpable and which by ultrasonography measured 0.6 cm. The left axilla was sonographically benign. Biopsy of this mass 11/09/2014 showed ([id number]) fibroadipose adipose tissue. This was felt to be discordant.  Accordingly the patient was referred to surgery and she underwent left breast radioactive seed localized lumpectomy 12/28/2014. The pathology from that procedure (([id number]) showed ductal carcinoma in situ, high-grade, measuring 0.2 cm. This was less than 0.1 cm from the posterior margin. The cells was estrogen receptor positive at 95%, with strong staining intensity, progesterone receptor positive at 30% with moderate staining intensity.  Her subsequent history is as detailed below. PAST MEDICAL HISTORY: Past Medical History:  Diagnosis  Date  . Asthma, persistent not controlled 07/27/2012  . Breast cancer ([place]) 12/28/14   Left BresastDCIS  . COLONIC POLYPS, HX OF 11/04/2007  . COPD (chronic obstructive pulmonary disease) ([place])   . GERD 11/04/2007  . GERD  (gastroesophageal reflux disease)   . HAIR LOSS 11/04/2007  . Hearing loss    bilateral - wears hearing aids  . Hx of cardiac cath 11/15/2016   a. LHC 11/15/16 showed normal coronaries and EF 50-55% with normal EDP. (done for false positive NST)  . Hx of colonic polyps 1995   adenomatous  . Hyperlipidemia   . HYPERLIPIDEMIA 11/04/2007  . Hypertension   . HYPERTENSION 06/07/2009  . LBBB (left bundle branch block) 07/26/2011   no current problems  . Personal history of radiation therapy   . S/P radiation therapy 02/02/15-03/24/15   left breast 60.4GY    PAST SURGICAL HISTORY: Past Surgical History:  Procedure Laterality Date  . ABDOMINAL HYSTERECTOMY    . ANTERIOR FUSION CERVICAL SPINE    . AUGMENTATION MAMMAPLASTY Bilateral   . bilat ear surgury/hearing loss     wears hearing aids  . BREAST LUMPECTOMY Left 2016  . BREAST LUMPECTOMY WITH RADIOACTIVE SEED LOCALIZATION Left 12/28/2014   Procedure: BREAST LUMPECTOMY WITH RADIOACTIVE SEED LOCALIZATION;  Surgeon: Autumn Messing III, MD;  Location: [facility];  Service: General;  Laterality: Left;  . COLONOSCOPY    . LEFT HEART CATH AND CORONARY ANGIOGRAPHY N/A 11/15/2016   Procedure: LEFT HEART CATH AND CORONARY ANGIOGRAPHY;  Surgeon: Belva Crome, MD;  Location: [facility];  Service: Cardiovascular;  Laterality: N/A;  . POLYPECTOMY    . UPPER GASTROINTESTINAL ENDOSCOPY    . WISDOM TOOTH EXTRACTION      FAMILY HISTORY: Family History  Problem Relation Age of Onset  . Breast cancer Other   . Cancer Paternal Grandmother        breast  . Lung cancer Father   . Breast cancer Maternal Grandmother   . Healthy Mother   . Coronary artery disease Neg Hx   . Colon cancer Neg Hx   . Pancreatic cancer Neg Hx   . Rectal cancer Neg Hx   . Stomach cancer Neg Hx   . Esophageal cancer Neg Hx    [name] died from lung cancer in the setting of tobacco abuse at age 72. [name] died from "old age" at age  22. The patient had one brother, 3 sisters. The brother had" I cancer" requiring enucleation--the patient does not know whether this was a primary ocular melanoma. [name] was diagnosed with breast cancer but the patient does not know at what age. There is no other history of breast or ovarian cancer in the family   GYNECOLOGIC HISTORY:  No LMP recorded (lmp unknown). Patient has had a hysterectomy. Menarche age 69, first live birth age 75. The patient is GX P3. She is status post hysterectomy without salpingo-oophorectomy. She did not take hormone replacement. She did use oral contraceptives remotely without any complications.   SOCIAL HISTORY:  Chiyoko works in accounts payable. Her husband [name] is disabled because of back problems. [name] is a Marine scientist working in rehabilitation in [place]. [name] lives in [place] where he works as a Games developer. [name] lives in [place] where he works as a Emergency planning/management officer. The patient has 4 grandchildren. She is not a Ambulance person.    ADVANCED DIRECTIVES: Not in place   HEALTH  MAINTENANCE: Social History   Tobacco Use  . Smoking status: Former Smoker    Packs/day: 1.00    Years: 20.00    Pack years: 20.00    Types: Cigarettes    Quit date: 08/19/1989    Years since quitting: 29.2  . Smokeless tobacco: Never Used  Substance Use Topics  . Alcohol use: Yes    Comment: Occasional  . Drug use: No    Colonoscopy:  PAP:  Bone density:  Lipid panel:  Allergies  Allergen Reactions  . Codeine     REACTION: nausea,  . Tramadol Hcl Nausea Only    Current Outpatient Medications  Medication Sig Dispense Refill  . aspirin EC 81 MG tablet Take 81 mg by mouth daily.    . cholecalciferol (VITAMIN D3) 25 MCG (1000 UT) tablet Take 1 tablet (1,000 Units total) by mouth daily.    . COMBIVENT RESPIMAT 20-100 MCG/ACT AERS respimat INHALE 1 PUFF BY MOUTH EVERY 6 HOURS 4 g 0  . losartan (COZAAR) 100  MG tablet Take 1 tablet (100 mg total) by mouth daily. 90 tablet 3  . morphine 10 MG/5ML solution Take 1.3-2.5 mLs (2.6-5 mg total) by mouth every 6 (six) hours as needed for severe pain. 30 mL 0  . palbociclib (IBRANCE) 125 MG capsule Take 1 capsule (125 mg total) by mouth daily. Take after lunch. Take for 21 days on, 7 days off, repeat every 28 days. 21 capsule 6  . pantoprazole (PROTONIX) 20 MG tablet Take 1 tablet (20 mg total) by mouth daily. 30 tablet 0  . Spacer/Aero-Holding Chambers DEVI Spacer to be used with inhaler 1 each 0  . valACYclovir (VALTREX) 500 MG tablet Take 1 tablet (500 mg total) by mouth 2 (two) times daily. 60 tablet 1  . ondansetron (ZOFRAN) 8 MG tablet Take 1 tablet (8 mg total) by mouth every 8 (eight) hours as needed for nausea or vomiting. (Patient not taking: Reported on 11/05/2018) 20 tablet 0  . prochlorperazine (COMPAZINE) 25 MG suppository Place 1 suppository (25 mg total) rectally every 12 (twelve) hours as needed for nausea or vomiting. (Patient not taking: Reported on 11/05/2018) 12 suppository 1  . promethazine (PHENERGAN) 25 MG suppository Place 1 suppository (25 mg total) rectally every 6 (six) hours as needed for nausea or vomiting. (Patient not taking: Reported on 11/05/2018) 12 each 0   No current facility-administered medications for this visit.     OBJECTIVE:  Vitals:   11/16/18 0945  BP: 118/67  Pulse: 83  Resp: 18  Temp: (!) 97.4 F (36.3 C)  SpO2: 99%     Body mass index is 31.23 kg/m.    ECOG FS:1 - Symptomatic but completely ambulatory  Filed Weights   11/16/18 0945  Weight: 176 lb 4.8 oz (80 kg)  GENERAL: Patient is a well appearing female in no acute distress HEENT:  Sclerae anicteric.  Oropharynx clear and moist.  No evidence of ulcerations or oropharyngeal candidiasis. Neck is supple. No sinus tenderness NODES:  No cervical, supraclavicular, or axillary lymphadenopathy palpated.  BREAST EXAM:  Deferred. LUNGS:  Clear to auscultation  bilaterally.  No wheezes or rhonchi. HEART:  Regular rate and rhythm. No murmur appreciated. ABDOMEN:  Soft, nontender.  Positive, normoactive bowel sounds. No organomegaly palpated. MSK:  No focal spinal tenderness to palpation. Full range of motion bilaterally in the upper extremities. EXTREMITIES:  No peripheral edema.   SKIN:  Clear with no obvious rashes or skin changes. No nail dyscrasia. NEURO:  Nonfocal. Well oriented.  Anxious and tearful when talking about shortness of breath.   LAB RESULTS:  CMP     Component Value Date/Time   NA 143 11/16/2018 0929   NA 141 04/25/2017 1213   NA 145 07/04/2016 1133   K 4.2 11/16/2018 0929   K 4.2 07/04/2016 1133   CL 108 11/16/2018 0929   CO2 27 11/16/2018 0929   CO2 28 07/04/2016 1133   GLUCOSE 98 11/16/2018 0929   GLUCOSE 101 07/04/2016 1133   BUN 16 11/16/2018 0929   BUN 18 04/25/2017 1213   BUN 16.0 07/04/2016 1133   CREATININE 1.20 (H) 11/16/2018 0929   CREATININE 1.09 (H) 07/23/2018 1339   CREATININE 0.9 07/04/2016 1133   CALCIUM 9.7 11/16/2018 0929   CALCIUM 9.9 07/04/2016 1133   PROT 6.9 11/16/2018 0929   PROT 7.3 07/04/2016 1133   ALBUMIN 3.3 (L) 11/16/2018 0929   ALBUMIN 3.8 07/04/2016 1133   AST 34 11/16/2018 0929   AST 35 07/23/2018 1339   AST 19 07/04/2016 1133   ALT 23 11/16/2018 0929   ALT 25 07/23/2018 1339   ALT 17 07/04/2016 1133   ALKPHOS 114 11/16/2018 0929   ALKPHOS 172 (H) 07/04/2016 1133   BILITOT 0.9 11/16/2018 0929   BILITOT 1.1 07/23/2018 1339   BILITOT 0.94 07/04/2016 1133   GFRNONAA 49 (L) 11/16/2018 0929   GFRNONAA 55 (L) 07/23/2018 1339   GFRAA 56 (L) 11/16/2018 0929   GFRAA >60 07/23/2018 1339    INo results found for: SPEP, UPEP  Lab Results  Component Value Date   WBC 2.5 (L) 11/16/2018   NEUTROABS PENDING 11/16/2018   HGB 11.8 (L) 11/16/2018   HCT 34.1 (L) 11/16/2018   MCV 100.9 (H) 11/16/2018   PLT 174 11/16/2018      Chemistry      Component Value Date/Time   NA 143  11/16/2018 0929   NA 141 04/25/2017 1213   NA 145 07/04/2016 1133   K 4.2 11/16/2018 0929   K 4.2 07/04/2016 1133   CL 108 11/16/2018 0929   CO2 27 11/16/2018 0929   CO2 28 07/04/2016 1133   BUN 16 11/16/2018 0929   BUN 18 04/25/2017 1213   BUN 16.0 07/04/2016 1133   CREATININE 1.20 (H) 11/16/2018 0929   CREATININE 1.09 (H) 07/23/2018 1339   CREATININE 0.9 07/04/2016 1133      Component Value Date/Time   CALCIUM 9.7 11/16/2018 0929   CALCIUM 9.9 07/04/2016 1133   ALKPHOS 114 11/16/2018 0929   ALKPHOS 172 (H) 07/04/2016 1133   AST 34 11/16/2018 0929   AST 35 07/23/2018 1339   AST 19 07/04/2016 1133   ALT 23 11/16/2018 0929   ALT 25 07/23/2018 1339   ALT 17 07/04/2016 1133   BILITOT 0.9 11/16/2018 0929   BILITOT 1.1 07/23/2018 1339   BILITOT 0.94 07/04/2016 1133     No results found for: LABCA2  No components found for: LABCA125  No results for input(s): INR in the last 168 hours.  Urinalysis    Component Value Date/Time   COLORURINE YELLOW 07/20/2018 1747   APPEARANCEUR CLEAR 07/20/2018 1747   LABSPEC 1.019 07/20/2018 1747   PHURINE 5.0 07/20/2018 1747   GLUCOSEU NEGATIVE 07/20/2018 1747   GLUCOSEU NEGATIVE 10/20/2017 1303   B and E 07/20/2018 [place] 07/20/2018 1747   KETONESUR 20 (A) 07/20/2018 [place] 07/20/2018 1747   UROBILINOGEN 1.0 10/20/2017 1303   NITRITE NEGATIVE 07/20/2018 1747  LEUKOCYTESUR NEGATIVE 07/20/2018 1747    STUDIES: Ct Chest W Contrast  Result Date: 11/11/2018 CLINICAL DATA:  Metastatic breast cancer to the bones. Status post radiation therapy to the bilateral ribs and lower thoracic spine completed 07/14/2018. Ongoing medical therapy. Restaging. EXAM: CT CHEST WITH CONTRAST TECHNIQUE: Multidetector CT imaging of the chest was performed during intravenous contrast administration. CONTRAST:  82m OMNIPAQUE IOHEXOL 300 MG/ML  SOLN COMPARISON:  02/06/2018 chest CT. FINDINGS: Cardiovascular:  Normal heart size. No significant pericardial effusion/thickening. Atherosclerotic nonaneurysmal thoracic aorta. Normal caliber pulmonary arteries. No central pulmonary emboli. Mediastinum/Nodes: No discrete thyroid nodules. Unremarkable esophagus. No pathologically enlarged axillary, mediastinal or hilar lymph nodes. Lungs/Pleura: No pneumothorax. No pleural effusion. Moderate centrilobular and paraseptal emphysema with mild diffuse bronchial wall thickening. There is new patchy consolidation and ground-glass opacity in the medial lower lobes bilaterally, with somewhat sharp lateral margins, favoring radiation change. No lung masses or significant pulmonary nodules. Upper abdomen: Small hiatal hernia. Musculoskeletal: There are numerous new patchy sclerotic osseous lesions throughout the thoracic spine, sternum and bilateral ribs, most prominent in the T2 vertebral body (series 6/image 72), T9 vertebral body (series 6/image 76), lateral left sixth rib (series 7/image 70) and posterior right sixth rib (series 7/image 53). There is significantly increased sclerosis in the dominant manubrial lesion (series 6/image 69). Intact appearing bilateral breast prostheses. Moderate thoracic spondylosis. IMPRESSION: 1. Significantly increased sclerosis in the dominant manubrial osseous metastasis. Innumerable new patchy sclerotic osseous lesions throughout the thoracic skeleton including the thoracic spine, sternum and bilateral ribs. Findings may represent treatment effect involving previously occult osseous metastases, versus new osseous metastatic disease. PET-CT could be obtained to assess lesion activity, as clinically warranted. 2. No thoracic lymphadenopathy or other findings of metastatic disease in the chest. 3. New patchy consolidation and ground-glass opacity in the medial lower lobes bilaterally with sharp lateral margins, favoring radiation change. Aortic Atherosclerosis (ICD10-I70.0) and Emphysema (ICD10-J43.9).  Electronically Signed   By: JIlona SorrelM.D.   On: 11/11/2018 13:16   Nm Bone Scan Whole Body  Result Date: 11/12/2018 CLINICAL DATA:  Breast carcinoma EXAM: NUCLEAR MEDICINE WHOLE BODY BONE SCAN TECHNIQUE: Whole body anterior and posterior images were obtained approximately 3 hours after intravenous injection of radiopharmaceutical. RADIOPHARMACEUTICALS:  21.7 mCi [id number] IV COMPARISON:  June 10, 2018 FINDINGS: Persistent uptake of radiotracer in the manubrium near the sternomanubrial junction is stable. There remains abnormal radiotracer uptake in the posterior right fourth rib. Increased radiotracer uptake in the lateral left eighth rib is present but slightly less apparent compared to the previous study. A focal area of increased uptake in the left parietal bone region of the skull is stable. The area of equivocal increased uptake in the left femoral neck regions seen on prior study is not evident currently. In comparison with the previous study, no new areas of abnormal radiotracer uptake present. Kidneys are noted in the flank positions bilaterally. IMPRESSION: Areas of uptake in ribs bilaterally and in the inferior manubrium remains suspicious for metastatic disease. Questionable metastatic focus in the left parietal bone, stable. No new areas of abnormal uptake to suggest progression of bony metastatic disease compared to prior study noted. Electronically Signed   By: WiLowella GripII M.D.   On: 11/12/2018 08:58     ASSESSMENT: [age].o. [place] woman status post left lumpectomy 12/28/2014 for ductal carcinoma in situ, high-grade measuring 0.2 cm, estrogen and progesterone receptor positive, with close but negative margins  (1) adjuvant radiation completed 03/24/2015  (2) anastrozole  started 04/19/2015   (a) bone density 03/01/2016 shows a T score of -1.2  (3) mildly abnormal hepatic function panel: negative workup for hepatitis B and C, normal alpha-1-anti-trypsin   (4) left lower extremity DVT diagnosed by Doppler ultrasonography 04/16/2016, with negative CT angiogram chest  (a) on Xarelto starting 04/16/2016   (b) negative hypercoagulable panel, with a minimally abnormal (indeterminate) anti-cardiolipin IgM  ((c) stopped rivaroxaban 07/16/2016, repeat D-Dimer WNL August 2018  METASTATIC DISEASE: December 2019: bone only (5) CT chest 02/06/2018 shows a mixed lytic and sclerotic process with fracture at the inferior manubrium but no other bone lesions.  However bone scan 02/16/2018 shows multiple bone lesions consistent with metastatic disease; no lung or pleura involvement, no adenopathy  (a) Bone marrow biopsy on 03/03/2018 shows metastatic carcinoma consistent with breast primary, estrogen and progesterone receptor strongly positive, HER-2 not amplified (1+)  (b) MRI abdomen and MRI brain at [facility] (results viewable in "care everywhere") notes known bone involvement in spine and pelvis, but no other area of metastatic disease in abdomen/pelvis, or brain.  (c) CA 27.29 not informative (37.9 on 03/03/2018)  (6) started denosumab/Xgeva on 03/18/2018, repeated Q28 d  (7) started fulvestrant on 03/03/2018 and palbociclib 03/18/2018 at 125 mg/d 21/7  (a) bone scan 06/10/2018 serves as our new baseline  (8) palliative radiation completed 07/14/2018  (a) esophagitis secondary to radiation has resolved   PLAN: Bernece is doing moderately well.  Her scans show no evidence of cancer progression, and that the bones are healing.  She will continue on her current treatment with Xgeva, Fulvestrant and Palbociclib as it appears she is tolerating it well.    Julanne unfortunately appears to have this new onset of significant dyspnea on exertion.  Her ambulatory oxygen saturation has remained at 95%, which is good.  She met with Dr. Jana Hakim as well today.   Due to the tachypnea, increased workload and tachycardia during this, I have placed orders for a STAT chest CTA to  be done today.  This was done, and she returned to clinic to review the results.  They were negative for pulmonary embolus, and showed radiation change in the lungs.  After I reviewed the results with Dr. Jana Hakim, she was recommended to f/u with her PCP about the possibility of emphysema or CAD contributing.    She will continue taking the Morphine as needed for pain.  This is managing her pain, keeping her activity level, and she is not having any bad side effects.  PMP aware was reviewed today.    Najwa will return in 4 weeks for labs, f/u, and injections.  She was recommended to continue with the appropriate pandemic precautions. She knows to call for any questions that may arise between now and her next appointment.  We are happy to see her sooner if needed.   Wilber Bihari, NP  11/16/18 10:39 AM Medical Oncology and Hematology [facility] [address] [place], [place] [address] Tel. [phone]    Fax. [phone]

## 2018-11-17 ENCOUNTER — Telehealth: Payer: Self-pay | Admitting: Adult Health

## 2018-11-17 ENCOUNTER — Encounter: Payer: Self-pay | Admitting: Adult Health

## 2018-11-17 NOTE — Telephone Encounter (Signed)
I talk with patient regarding schedule  

## 2018-11-19 ENCOUNTER — Telehealth: Payer: Self-pay

## 2018-11-19 NOTE — Telephone Encounter (Signed)
Covid-19 screening questions   Do you now or have you had a fever in the last 14 days?  Do you have any respiratory symptoms of shortness of breath or cough now or in the last 14 days?  Do you have any family members or close contacts with diagnosed or suspected Covid-19 in the past 14 days?  Have you been tested for Covid-19 and found to be positive?       

## 2018-11-20 ENCOUNTER — Other Ambulatory Visit: Payer: Self-pay

## 2018-11-20 ENCOUNTER — Encounter: Payer: Self-pay | Admitting: Gastroenterology

## 2018-11-20 ENCOUNTER — Ambulatory Visit (AMBULATORY_SURGERY_CENTER): Payer: 59 | Admitting: Gastroenterology

## 2018-11-20 VITALS — BP 144/54 | HR 78 | Temp 98.1°F | Resp 18 | Ht 63.0 in | Wt 177.0 lb

## 2018-11-20 DIAGNOSIS — Z86010 Personal history of colonic polyps: Secondary | ICD-10-CM | POA: Diagnosis present

## 2018-11-20 MED ORDER — SODIUM CHLORIDE 0.9 % IV SOLN: 500.0000 mL | Freq: Once | INTRAVENOUS | Status: DC

## 2018-11-20 NOTE — Op Note (Signed)
[place] Patient Name: Caitlin Wall Procedure Date: 11/20/2018 1:18 PM MRN: [id number] Endoscopist: Ladene Artist , MD Age: 63 Referring MD:  Date of Birth: 07-23-1955 Gender: Female Account #: [id number] Procedure:                Colonoscopy Indications:              Surveillance: Personal history of adenomatous                            polyps on last colonoscopy > 5 years ago Medicines:                Monitored Anesthesia Care Procedure:                Pre-Anesthesia Assessment:                           - Prior to the procedure, a History and Physical                            was performed, and patient medications and                            allergies were reviewed. The patient's tolerance of                            previous anesthesia was also reviewed. The risks                            and benefits of the procedure and the sedation                            options and risks were discussed with the patient.                            All questions were answered, and informed consent                            was obtained. Prior Anticoagulants: The patient has                            taken no previous anticoagulant or antiplatelet                            agents. ASA Grade Assessment: II - A patient with                            mild systemic disease. After reviewing the risks                            and benefits, the patient was deemed in                            satisfactory condition to undergo the procedure.  After obtaining informed consent, the colonoscope                            was passed under direct vision. Throughout the                            procedure, the patient's blood pressure, pulse, and                            oxygen saturations were monitored continuously. The                            LOANER 0255 was introduced through the anus and                            advanced to the the  cecum, identified by                            appendiceal orifice and ileocecal valve. The                            ileocecal valve, appendiceal orifice, and rectum                            were photographed. The quality of the bowel                            preparation was good. The colonoscopy was performed                            without difficulty. The patient tolerated the                            procedure well. Scope In: 2:17:06 PM Scope Out: 2:30:15 PM Scope Withdrawal Time: 0 hours 8 minutes 31 seconds  Total Procedure Duration: 0 hours 13 minutes 9 seconds  Findings:                 The perianal and digital rectal examinations were                            normal.                           Multiple small-mouthed diverticula were found in                            the left colon. There was no evidence of                            diverticular bleeding.                           Internal hemorrhoids were found during  retroflexion. The hemorrhoids were small and Grade                            I (internal hemorrhoids that do not prolapse).                           The exam was otherwise without abnormality on                            direct and retroflexion views. Complications:            No immediate complications. Estimated blood loss:                            None. Estimated Blood Loss:     Estimated blood loss: none. Impression:               - Mild diverticulosis in the left colon. There was                            no evidence of diverticular bleeding.                           - Internal hemorrhoids.                           - The examination was otherwise normal on direct                            and retroflexion views.                           - No specimens collected. Recommendation:           - Repeat colonoscopy in 7 years for surveillance.                           - Patient has a contact number available for                             emergencies. The signs and symptoms of potential                            delayed complications were discussed with the                            patient. Return to normal activities tomorrow.                            Written discharge instructions were provided to the                            patient.                           - High fiber diet.                           -  Continue present medications. Ladene Artist, MD 11/20/2018 2:34:02 PM This report has been signed electronically.

## 2018-11-20 NOTE — Progress Notes (Signed)
KA - Temp CW- VS  Pt's states no medical or surgical changes since previsit or office visit.   

## 2018-11-20 NOTE — Progress Notes (Signed)
Report given to PACU, vss 

## 2018-11-20 NOTE — Patient Instructions (Signed)
YOU HAD AN ENDOSCOPIC PROCEDURE TODAY AT [facility]:   Refer to the procedure report that was given to you for any specific questions about what was found during the examination.  If the procedure report does not answer your questions, please call your gastroenterologist to clarify.  If you requested that your care partner not be given the details of your procedure findings, then the procedure report has been included in a sealed envelope for you to review at your convenience later.  **Handouts given on hemorrhoids and diverticulosis**   YOU SHOULD EXPECT: Some feelings of bloating in the abdomen. Passage of more gas than usual.  Walking can help get rid of the air that was put into your GI tract during the procedure and reduce the bloating. If you had a lower endoscopy (such as a colonoscopy or flexible sigmoidoscopy) you may notice spotting of blood in your stool or on the toilet paper. If you underwent a bowel prep for your procedure, you may not have a normal bowel movement for a few days.  Please Note:  You might notice some irritation and congestion in your nose or some drainage.  This is from the oxygen used during your procedure.  There is no need for concern and it should clear up in a day or so.  SYMPTOMS TO REPORT IMMEDIATELY:   Following lower endoscopy (colonoscopy or flexible sigmoidoscopy):  Excessive amounts of blood in the stool  Significant tenderness or worsening of abdominal pains  Swelling of the abdomen that is new, acute  Fever of 100F or higher    For urgent or emergent issues, a gastroenterologist can be reached at any hour by calling [phone].   DIET:  We do recommend a small meal at first, but then you may proceed to your regular diet.  Drink plenty of fluids but you should avoid alcoholic beverages for 24 hours.  ACTIVITY:  You should plan to take it easy for the rest of today and you should NOT DRIVE or use heavy machinery until tomorrow  (because of the sedation medicines used during the test).    FOLLOW UP: Our staff will call the number listed on your records 48-72 hours following your procedure to check on you and address any questions or concerns that you may have regarding the information given to you following your procedure. If we do not reach you, we will leave a message.  We will attempt to reach you two times.  During this call, we will ask if you have developed any symptoms of COVID 19. If you develop any symptoms (ie: fever, flu-like symptoms, shortness of breath, cough etc.) before then, please call [phone].  If you test positive for Covid 19 in the 2 weeks post procedure, please call and report this information to [place].    If any biopsies were taken you will be contacted by phone or by letter within the next 1-3 weeks.  Please call us at [phone] if you have not heard about the biopsies in 3 weeks.    SIGNATURES/CONFIDENTIALITY: You and/or your care partner have signed paperwork which will be entered into your electronic medical record.  These signatures attest to the fact that that the information above on your After Visit Summary has been reviewed and is understood.  Full responsibility of the confidentiality of this discharge information lies with you and/or your care-partner.

## 2018-11-21 ENCOUNTER — Ambulatory Visit: Payer: 59

## 2018-11-24 ENCOUNTER — Telehealth: Payer: Self-pay

## 2018-11-24 NOTE — Telephone Encounter (Signed)
  Follow up Call-  Call back number 11/20/2018  Post procedure Call Back phone  # [phone] cell  Permission to leave phone message Yes  Some recent data might be hidden     Patient questions:  Do you have a fever, pain , or abdominal swelling? No. Pain Score  0 *  Have you tolerated food without any problems? Yes.    Have you been able to return to your normal activities? Yes.    Do you have any questions about your discharge instructions: Diet   No. Medications  No. Follow up visit  No.  Do you have questions or concerns about your Care? No.  Actions: * If pain score is 4 or above: No action needed, pain <4.  1. Have you developed a fever since your procedure? no  2.   Have you had an respiratory symptoms (SOB or cough) since your procedure? no  3.   Have you tested positive for COVID 19 since your procedure no  4.   Have you had any family members/close contacts diagnosed with the COVID 19 since your procedure?  no   If yes to any of these questions please route to Joylene John, RN and Alphonsa Gin, Therapist, sports.

## 2018-11-25 ENCOUNTER — Other Ambulatory Visit: Payer: Self-pay | Admitting: Internal Medicine

## 2018-11-25 ENCOUNTER — Other Ambulatory Visit: Payer: Self-pay | Admitting: Adult Health

## 2018-11-25 DIAGNOSIS — B9689 Other specified bacterial agents as the cause of diseases classified elsewhere: Secondary | ICD-10-CM

## 2018-11-26 ENCOUNTER — Other Ambulatory Visit: Payer: Self-pay

## 2018-11-26 ENCOUNTER — Ambulatory Visit (INDEPENDENT_AMBULATORY_CARE_PROVIDER_SITE_OTHER): Payer: 59

## 2018-11-26 DIAGNOSIS — Z853 Personal history of malignant neoplasm of breast: Secondary | ICD-10-CM

## 2018-11-26 DIAGNOSIS — J45998 Other asthma: Secondary | ICD-10-CM

## 2018-11-26 DIAGNOSIS — Z23 Encounter for immunization: Secondary | ICD-10-CM

## 2018-11-26 DIAGNOSIS — J449 Chronic obstructive pulmonary disease, unspecified: Secondary | ICD-10-CM

## 2018-12-01 ENCOUNTER — Other Ambulatory Visit: Payer: Self-pay

## 2018-12-01 ENCOUNTER — Encounter: Payer: Self-pay | Admitting: Internal Medicine

## 2018-12-01 ENCOUNTER — Ambulatory Visit (INDEPENDENT_AMBULATORY_CARE_PROVIDER_SITE_OTHER): Payer: 59 | Admitting: Internal Medicine

## 2018-12-01 VITALS — BP 140/100 | HR 92 | Temp 98.3°F | Ht 63.0 in | Wt 175.0 lb

## 2018-12-01 DIAGNOSIS — I10 Essential (primary) hypertension: Secondary | ICD-10-CM | POA: Diagnosis not present

## 2018-12-01 DIAGNOSIS — R0789 Other chest pain: Secondary | ICD-10-CM

## 2018-12-01 DIAGNOSIS — I447 Left bundle-branch block, unspecified: Secondary | ICD-10-CM | POA: Diagnosis not present

## 2018-12-01 DIAGNOSIS — R0609 Other forms of dyspnea: Secondary | ICD-10-CM | POA: Insufficient documentation

## 2018-12-01 DIAGNOSIS — R0600 Dyspnea, unspecified: Secondary | ICD-10-CM

## 2018-12-01 MED ORDER — PREDNISONE 10 MG PO TABS: ORAL_TABLET | ORAL | 0 refills | Status: DC

## 2018-12-01 MED ORDER — BUDESONIDE-FORMOTEROL FUMARATE 160-4.5 MCG/ACT IN AERO: 2.0000 | INHALATION_SPRAY | Freq: Two times a day (BID) | RESPIRATORY_TRACT | 3 refills | Status: AC

## 2018-12-01 MED ORDER — METHYLPREDNISOLONE ACETATE 80 MG/ML IJ SUSP
80.0000 mg | Freq: Once | INTRAMUSCULAR | Status: AC
  Administered 2018-12-01: 80 mg via INTRAMUSCULAR

## 2018-12-01 NOTE — Patient Instructions (Signed)
You had the steroid shot today  Please take all new medication as prescribed - the prednisone, and symbicort  Please continue all other medications as before, and refills have been done if requested.  Please have the pharmacy call with any other refills you may need.  Please continue your efforts at being more active, low cholesterol diet, and weight control.  Please keep your appointments with your specialists as you may have planned  You will be contacted regarding the referral for: pulmonary

## 2018-12-01 NOTE — Assessment & Plan Note (Signed)
Mild elev likely reactive,  to f/u any worsening symptoms or concerns

## 2018-12-01 NOTE — Progress Notes (Addendum)
Subjective:    Patient ID: Caitlin Wall, female    DOB: [birth year], 63 y.o.   MRN: [id number]  HPI  Here with c/o 1-2 ks onset sob/doe for room to room to the next, with non prod cough, but no frank wheezing or fever.  Has a kind of diffuse chest fullness and dull pressure, mild, intermittent, not assoc with n/v, diaphoresis, palp or dizziness.  Pt denies new neurological symptoms such as new headache, or facial or extremity weakness or numbness  Pt denies polydipsia, polyuria.  Last stress test false positive 2018, with subsequent cath with nor cors sept 2018.  Recent CTA chest with no PE, chf or infiltrate  Has appt Oct 12 with pulm but will not see Dr Ander Slade, for undeclared reason. Combivent helps Past Medical History:  Diagnosis Date  . Asthma, persistent not controlled 07/27/2012  . Breast cancer ([place]) 12/28/14   Left BresastDCIS  . COLONIC POLYPS, HX OF 11/04/2007  . COPD (chronic obstructive pulmonary disease) ([place])   . GERD 11/04/2007  . GERD (gastroesophageal reflux disease)   . HAIR LOSS 11/04/2007  . Hearing loss    bilateral - wears hearing aids  . Hx of cardiac cath 11/15/2016   a. LHC 11/15/16 showed normal coronaries and EF 50-55% with normal EDP. (done for false positive NST)  . Hx of colonic polyps 1995   adenomatous  . Hyperlipidemia   . HYPERLIPIDEMIA 11/04/2007  . Hypertension   . HYPERTENSION 06/07/2009  . LBBB (left bundle branch block) 07/26/2011   no current problems  . Personal history of radiation therapy   . S/P radiation therapy 02/02/15-03/24/15   left breast 62.4GY   Past Surgical History:  Procedure Laterality Date  . ABDOMINAL HYSTERECTOMY    . ANTERIOR FUSION CERVICAL SPINE    . AUGMENTATION MAMMAPLASTY Bilateral   . bilat ear surgury/hearing loss     wears hearing aids  . BREAST LUMPECTOMY Left 2016  . BREAST LUMPECTOMY WITH RADIOACTIVE SEED LOCALIZATION Left 12/28/2014   Procedure: BREAST LUMPECTOMY WITH RADIOACTIVE SEED LOCALIZATION;  Surgeon:  Autumn Messing III, MD;  Location: [facility];  Service: General;  Laterality: Left;  . COLONOSCOPY    . LEFT HEART CATH AND CORONARY ANGIOGRAPHY N/A 11/15/2016   Procedure: LEFT HEART CATH AND CORONARY ANGIOGRAPHY;  Surgeon: Belva Crome, MD;  Location: [facility];  Service: Cardiovascular;  Laterality: N/A;  . POLYPECTOMY    . UPPER GASTROINTESTINAL ENDOSCOPY    . WISDOM TOOTH EXTRACTION      reports that she quit smoking about 29 years ago. Her smoking use included cigarettes. She has a 20.00 pack-year smoking history. She has never used smokeless tobacco. She reports current alcohol use. She reports that she does not use drugs. family history includes Breast cancer in her maternal grandmother and another family member; Cancer in her paternal grandmother; Healthy in her mother; Lung cancer in her father. Allergies  Allergen Reactions  . Codeine     REACTION: nausea,  . Tramadol Hcl Nausea Only   Current Outpatient Medications on File Prior to Visit  Medication Sig Dispense Refill  . aspirin EC 81 MG tablet Take 81 mg by mouth daily.    . cholecalciferol (VITAMIN D3) 25 MCG (1000 UT) tablet Take 1 tablet (1,000 Units total) by mouth daily.    . COMBIVENT RESPIMAT 20-100 MCG/ACT AERS respimat INHALE 1 PUFF BY MOUTH EVERY 6 HOURS 4 g 0  . losartan (COZAAR) 100 MG tablet Take  1 tablet by mouth once daily 30 tablet 0  . morphine 10 MG/5ML solution Take 1.3-2.5 mLs (2.6-5 mg total) by mouth every 6 (six) hours as needed for severe pain. 30 mL 0  . ondansetron (ZOFRAN) 8 MG tablet Take 1 tablet (8 mg total) by mouth every 8 (eight) hours as needed for nausea or vomiting. 20 tablet 0  . palbociclib (IBRANCE) 125 MG capsule Take 1 capsule (125 mg total) by mouth daily. Take after lunch. Take for 21 days on, 7 days off, repeat every 28 days. 21 capsule 6  . pantoprazole (PROTONIX) 20 MG tablet Take 1 tablet (20 mg total) by mouth daily. 30 tablet 0  . prochlorperazine  (COMPAZINE) 25 MG suppository Place 1 suppository (25 mg total) rectally every 12 (twelve) hours as needed for nausea or vomiting. 12 suppository 1  . promethazine (PHENERGAN) 25 MG suppository Place 1 suppository (25 mg total) rectally every 6 (six) hours as needed for nausea or vomiting. 12 each 0  . Spacer/Aero-Holding Chambers DEVI Spacer to be used with inhaler 1 each 0  . valACYclovir (VALTREX) 500 MG tablet Take 1 tablet (500 mg total) by mouth 2 (two) times daily. 60 tablet 1   No current facility-administered medications on file prior to visit.    Review of Systems  Constitutional: Negative for other unusual diaphoresis or sweats HENT: Negative for ear discharge or swelling Eyes: Negative for other worsening visual disturbances Respiratory: Negative for stridor or other swelling  Gastrointestinal: Negative for worsening distension or other blood Genitourinary: Negative for retention or other urinary change Musculoskeletal: Negative for other MSK pain or swelling Skin: Negative for color change or other new lesions Neurological: Negative for worsening tremors and other numbness  Psychiatric/Behavioral: Negative for worsening agitation or other fatigue All otherwise neg per pt    Objective:   Physical Exam BP (!) 140/100 (BP Location: Left Arm, Patient Position: Sitting, Cuff Size: Normal)   Pulse 92   Temp 98.3 F (36.8 C) (Oral)   Ht 5\' 3"  (1.6 m)   Wt 175 lb (79.4 kg)   LMP  (LMP Unknown)   SpO2 94%   BMI 31.00 kg/m  VS noted,  Constitutional: Pt appears in NAD HENT: Head: NCAT.  Right Ear: External ear normal.  Left Ear: External ear normal.  Eyes: . Pupils are equal, round, and reactive to light. Conjunctivae and EOM are normal Nose: without d/c or deformity Neck: Neck supple. Gross normal ROM Cardiovascular: Normal rate and regular rhythm.   Pulmonary/Chest: Effort normal and breath sounds decreased without rales or wheezing.  Abd:  Soft, NT, ND, + BS, no  organomegaly Neurological: Pt is alert. At baseline orientation, motor grossly intact Skin: Skin is warm. No rashes, other new lesions, no LE edema Psychiatric: Pt behavior is normal without agitation  No other exam fidnings Lab Results  Component Value Date   WBC 2.5 (L) 11/16/2018   HGB 11.8 (L) 11/16/2018   HCT 34.1 (L) 11/16/2018   PLT 174 11/16/2018   GLUCOSE 98 11/16/2018   CHOL 164 10/20/2017   TRIG 99.0 10/20/2017   HDL 39.20 10/20/2017   LDLCALC 105 (H) 10/20/2017   ALT 23 11/16/2018   AST 34 11/16/2018   NA 143 11/16/2018   K 4.2 11/16/2018   CL 108 11/16/2018   CREATININE 1.20 (H) 11/16/2018   BUN 16 11/16/2018   CO2 27 11/16/2018   TSH 1.71 10/20/2017   INR 1.0 11/12/2016   ECG today I have personally  interpreted:  NSR with LBBB    Assessment & Plan:

## 2018-12-01 NOTE — Assessment & Plan Note (Addendum)
C/w likely copd/athma exacerbation, for depomedrol IM 80, predpac asd, cont combivent, add symbicort, refer pulm per pt request  Note:  Total time for pt hx, exam, review of record with pt in the room, determination of diagnoses and plan for further eval and tx is > 40 min, with over 50% spent in coordination and counseling of patient including the differential dx, tx, further evaluation and other management of doe, LBBB, HTN

## 2018-12-01 NOTE — Assessment & Plan Note (Signed)
Noted again on ecg,  to f/u any worsening symptoms or concerns

## 2018-12-02 ENCOUNTER — Telehealth: Payer: Self-pay

## 2018-12-02 NOTE — Telephone Encounter (Signed)
TC today from Pt. Inquiring if she could take Symbicort and prednisone while she  Is taking Ibrance. Informed Pt. That she can take Both medications while on Ibrance stated to her that both medications could make her feel jittery and informed her to take the prednisone in the morning. Also informed Pt if she had any other questions to give [place] a call. Pt. Verbalized understanding

## 2018-12-03 ENCOUNTER — Telehealth: Payer: Self-pay | Admitting: Pulmonary Disease

## 2018-12-03 ENCOUNTER — Other Ambulatory Visit: Payer: Self-pay | Admitting: Oncology

## 2018-12-03 NOTE — Telephone Encounter (Signed)
Spoke with the pt  She is scheduled with Dr Ander Slade for consult and is requesting to switch providers  Pt scheduled with Dr Tamala Julian  Nothing further needed

## 2018-12-07 ENCOUNTER — Institutional Professional Consult (permissible substitution): Payer: 59 | Admitting: Pulmonary Disease

## 2018-12-07 ENCOUNTER — Institutional Professional Consult (permissible substitution): Payer: 59 | Admitting: Internal Medicine

## 2018-12-14 ENCOUNTER — Other Ambulatory Visit: Payer: Self-pay

## 2018-12-14 ENCOUNTER — Inpatient Hospital Stay: Payer: 59 | Attending: Oncology

## 2018-12-14 ENCOUNTER — Encounter: Payer: Self-pay | Admitting: Adult Health

## 2018-12-14 ENCOUNTER — Inpatient Hospital Stay (HOSPITAL_BASED_OUTPATIENT_CLINIC_OR_DEPARTMENT_OTHER): Payer: 59 | Admitting: Adult Health

## 2018-12-14 ENCOUNTER — Inpatient Hospital Stay: Payer: 59

## 2018-12-14 DIAGNOSIS — Z79899 Other long term (current) drug therapy: Secondary | ICD-10-CM | POA: Diagnosis not present

## 2018-12-14 DIAGNOSIS — Z5111 Encounter for antineoplastic chemotherapy: Secondary | ICD-10-CM | POA: Insufficient documentation

## 2018-12-14 DIAGNOSIS — Z87891 Personal history of nicotine dependence: Secondary | ICD-10-CM | POA: Diagnosis not present

## 2018-12-14 DIAGNOSIS — I10 Essential (primary) hypertension: Secondary | ICD-10-CM | POA: Insufficient documentation

## 2018-12-14 DIAGNOSIS — K208 Other esophagitis without bleeding: Secondary | ICD-10-CM | POA: Diagnosis not present

## 2018-12-14 DIAGNOSIS — T66XXXA Radiation sickness, unspecified, initial encounter: Secondary | ICD-10-CM | POA: Diagnosis not present

## 2018-12-14 DIAGNOSIS — Z170 Estrogen receptor positive status [ER+]: Secondary | ICD-10-CM | POA: Diagnosis not present

## 2018-12-14 DIAGNOSIS — Z803 Family history of malignant neoplasm of breast: Secondary | ICD-10-CM | POA: Insufficient documentation

## 2018-12-14 DIAGNOSIS — C50512 Malignant neoplasm of lower-outer quadrant of left female breast: Secondary | ICD-10-CM | POA: Diagnosis not present

## 2018-12-14 DIAGNOSIS — Z801 Family history of malignant neoplasm of trachea, bronchus and lung: Secondary | ICD-10-CM | POA: Insufficient documentation

## 2018-12-14 DIAGNOSIS — C7951 Secondary malignant neoplasm of bone: Secondary | ICD-10-CM | POA: Insufficient documentation

## 2018-12-14 DIAGNOSIS — R918 Other nonspecific abnormal finding of lung field: Secondary | ICD-10-CM | POA: Insufficient documentation

## 2018-12-14 DIAGNOSIS — J439 Emphysema, unspecified: Secondary | ICD-10-CM | POA: Insufficient documentation

## 2018-12-14 DIAGNOSIS — Z853 Personal history of malignant neoplasm of breast: Secondary | ICD-10-CM | POA: Insufficient documentation

## 2018-12-14 DIAGNOSIS — Z86010 Personal history of colonic polyps: Secondary | ICD-10-CM | POA: Insufficient documentation

## 2018-12-14 DIAGNOSIS — I700 Atherosclerosis of aorta: Secondary | ICD-10-CM | POA: Insufficient documentation

## 2018-12-14 DIAGNOSIS — Z885 Allergy status to narcotic agent status: Secondary | ICD-10-CM | POA: Diagnosis not present

## 2018-12-14 DIAGNOSIS — Z923 Personal history of irradiation: Secondary | ICD-10-CM | POA: Insufficient documentation

## 2018-12-14 DIAGNOSIS — Z8719 Personal history of other diseases of the digestive system: Secondary | ICD-10-CM | POA: Insufficient documentation

## 2018-12-14 DIAGNOSIS — Z86718 Personal history of other venous thrombosis and embolism: Secondary | ICD-10-CM | POA: Insufficient documentation

## 2018-12-14 DIAGNOSIS — Z90710 Acquired absence of both cervix and uterus: Secondary | ICD-10-CM | POA: Diagnosis not present

## 2018-12-14 DIAGNOSIS — I824Z2 Acute embolism and thrombosis of unspecified deep veins of left distal lower extremity: Secondary | ICD-10-CM

## 2018-12-14 DIAGNOSIS — Z7901 Long term (current) use of anticoagulants: Secondary | ICD-10-CM | POA: Insufficient documentation

## 2018-12-14 DIAGNOSIS — Z812 Family history of tobacco abuse and dependence: Secondary | ICD-10-CM | POA: Insufficient documentation

## 2018-12-14 DIAGNOSIS — J45909 Unspecified asthma, uncomplicated: Secondary | ICD-10-CM | POA: Insufficient documentation

## 2018-12-14 LAB — CBC WITH DIFFERENTIAL/PLATELET
Abs Immature Granulocytes: 0.04 10*3/uL (ref 0.00–0.07)
Basophils Absolute: 0 10*3/uL (ref 0.0–0.1)
Basophils Relative: 0 %
Eosinophils Absolute: 0.1 10*3/uL (ref 0.0–0.5)
Eosinophils Relative: 1 %
HCT: 40.3 % (ref 36.0–46.0)
Hemoglobin: 13.7 g/dL (ref 12.0–15.0)
Immature Granulocytes: 1 %
Lymphocytes Relative: 10 %
Lymphs Abs: 0.6 10*3/uL — ABNORMAL LOW (ref 0.7–4.0)
MCH: 36.8 pg — ABNORMAL HIGH (ref 26.0–34.0)
MCHC: 34 g/dL (ref 30.0–36.0)
MCV: 108.3 fL — ABNORMAL HIGH (ref 80.0–100.0)
Monocytes Absolute: 0.6 10*3/uL (ref 0.1–1.0)
Monocytes Relative: 9 %
Neutro Abs: 5.1 10*3/uL (ref 1.7–7.7)
Neutrophils Relative %: 79 %
Platelets: 151 10*3/uL (ref 150–400)
RBC: 3.72 MIL/uL — ABNORMAL LOW (ref 3.87–5.11)
RDW: 16 % — ABNORMAL HIGH (ref 11.5–15.5)
WBC: 6.4 10*3/uL (ref 4.0–10.5)
nRBC: 0 % (ref 0.0–0.2)

## 2018-12-14 LAB — COMPREHENSIVE METABOLIC PANEL
ALT: 22 U/L (ref 0–44)
AST: 24 U/L (ref 15–41)
Albumin: 3.5 g/dL (ref 3.5–5.0)
Alkaline Phosphatase: 90 U/L (ref 38–126)
Anion gap: 11 (ref 5–15)
BUN: 25 mg/dL — ABNORMAL HIGH (ref 8–23)
CO2: 28 mmol/L (ref 22–32)
Calcium: 9.9 mg/dL (ref 8.9–10.3)
Chloride: 103 mmol/L (ref 98–111)
Creatinine, Ser: 1.33 mg/dL — ABNORMAL HIGH (ref 0.44–1.00)
GFR calc Af Amer: 50 mL/min — ABNORMAL LOW (ref >60)
GFR calc non Af Amer: 43 mL/min — ABNORMAL LOW (ref >60)
Glucose, Bld: 83 mg/dL (ref 70–99)
Potassium: 4.3 mmol/L (ref 3.5–5.1)
Sodium: 142 mmol/L (ref 135–145)
Total Bilirubin: 1.1 mg/dL (ref 0.3–1.2)
Total Protein: 6.7 g/dL (ref 6.5–8.1)

## 2018-12-14 MED ORDER — FULVESTRANT 250 MG/5ML IM SOLN
INTRAMUSCULAR | Status: AC
  Filled 2018-12-14: qty 5

## 2018-12-14 MED ORDER — FULVESTRANT 250 MG/5ML IM SOLN
500.0000 mg | Freq: Once | INTRAMUSCULAR | Status: AC
  Administered 2018-12-14: 500 mg via INTRAMUSCULAR

## 2018-12-14 MED ORDER — MORPHINE SULFATE 10 MG/5ML PO SOLN: 2.5000 mg | Freq: Four times a day (QID) | ORAL | 0 refills | Status: DC | PRN

## 2018-12-14 MED ORDER — DENOSUMAB 120 MG/1.7ML ~~LOC~~ SOLN
SUBCUTANEOUS | Status: AC
  Filled 2018-12-14: qty 1.7

## 2018-12-14 MED ORDER — DENOSUMAB 120 MG/1.7ML ~~LOC~~ SOLN
120.0000 mg | Freq: Once | SUBCUTANEOUS | Status: AC
  Administered 2018-12-14: 120 mg via SUBCUTANEOUS

## 2018-12-14 NOTE — Patient Instructions (Signed)
Fulvestrant injection What is this medicine? FULVESTRANT (ful VES trant) blocks the effects of estrogen. It is used to treat breast cancer. This medicine may be used for other purposes; ask your health care provider or pharmacist if you have questions. COMMON BRAND NAME(S): FASLODEX What should I tell my health care provider before I take this medicine? They need to know if you have any of these conditions:  bleeding disorders  liver disease  low blood counts, like low white cell, platelet, or red cell counts  an unusual or allergic reaction to fulvestrant, other medicines, foods, dyes, or preservatives  pregnant or trying to get pregnant  breast-feeding How should I use this medicine? This medicine is for injection into a muscle. It is usually given by a health care professional in a hospital or clinic setting. Talk to your pediatrician regarding the use of this medicine in children. Special care may be needed. Overdosage: If you think you have taken too much of this medicine contact a poison control center or emergency room at once. NOTE: This medicine is only for you. Do not share this medicine with others. What if I miss a dose? It is important not to miss your dose. Call your doctor or health care professional if you are unable to keep an appointment. What may interact with this medicine?  medicines that treat or prevent blood clots like warfarin, enoxaparin, dalteparin, apixaban, dabigatran, and rivaroxaban This list may not describe all possible interactions. Give your health care provider a list of all the medicines, herbs, non-prescription drugs, or dietary supplements you use. Also tell them if you smoke, drink alcohol, or use illegal drugs. Some items may interact with your medicine. What should I watch for while using this medicine? Your condition will be monitored carefully while you are receiving this medicine. You will need important blood work done while you are taking  this medicine. Do not become pregnant while taking this medicine or for at least 1 year after stopping it. Women of child-bearing potential will need to have a negative pregnancy test before starting this medicine. Women should inform their doctor if they wish to become pregnant or think they might be pregnant. There is a potential for serious side effects to an unborn child. Men should inform their doctors if they wish to father a child. This medicine may lower sperm counts. Talk to your health care professional or pharmacist for more information. Do not breast-feed an infant while taking this medicine or for 1 year after the last dose. What side effects may I notice from receiving this medicine? Side effects that you should report to your doctor or health care professional as soon as possible:  allergic reactions like skin rash, itching or hives, swelling of the face, lips, or tongue  feeling faint or lightheaded, falls  pain, tingling, numbness, or weakness in the legs  signs and symptoms of infection like fever or chills; cough; flu-like symptoms; sore throat  vaginal bleeding Side effects that usually do not require medical attention (report to your doctor or health care professional if they continue or are bothersome):  aches, pains  constipation  diarrhea  headache  hot flashes  nausea, vomiting  pain at site where injected  stomach pain This list may not describe all possible side effects. Call your doctor for medical advice about side effects. You may report side effects to FDA at 1-800-FDA-1088. Where should I keep my medicine? This drug is given in a hospital or clinic and will  not be stored at home. NOTE: This sheet is a summary. It may not cover all possible information. If you have questions about this medicine, talk to your doctor, pharmacist, or health care provider.  2020 [employer] Standard (2017-05-22 11:34:41) Denosumab injection What is this  medicine? DENOSUMAB (den oh sue mab) slows bone breakdown. Prolia is used to treat osteoporosis in women after menopause and in men, and in people who are taking corticosteroids for 6 months or more. Delton See is used to treat a high calcium level due to cancer and to prevent bone fractures and other bone problems caused by multiple myeloma or cancer bone metastases. Delton See is also used to treat giant cell tumor of the bone. This medicine may be used for other purposes; ask your health care provider or pharmacist if you have questions. COMMON BRAND NAME(S): Prolia, XGEVA What should I tell my health care provider before I take this medicine? They need to know if you have any of these conditions:  dental disease  having surgery or tooth extraction  infection  kidney disease  low levels of calcium or Vitamin D in the blood  malnutrition  on hemodialysis  skin conditions or sensitivity  thyroid or parathyroid disease  an unusual reaction to denosumab, other medicines, foods, dyes, or preservatives  pregnant or trying to get pregnant  breast-feeding How should I use this medicine? This medicine is for injection under the skin. It is given by a health care professional in a hospital or clinic setting. A special MedGuide will be given to you before each treatment. Be sure to read this information carefully each time. For Prolia, talk to your pediatrician regarding the use of this medicine in children. Special care may be needed. For Delton See, talk to your pediatrician regarding the use of this medicine in children. While this drug may be prescribed for children as young as 13 years for selected conditions, precautions do apply. Overdosage: If you think you have taken too much of this medicine contact a poison control center or emergency room at once. NOTE: This medicine is only for you. Do not share this medicine with others. What if I miss a dose? It is important not to miss your dose. Call  your doctor or health care professional if you are unable to keep an appointment. What may interact with this medicine? Do not take this medicine with any of the following medications:  other medicines containing denosumab This medicine may also interact with the following medications:  medicines that lower your chance of fighting infection  steroid medicines like prednisone or cortisone This list may not describe all possible interactions. Give your health care provider a list of all the medicines, herbs, non-prescription drugs, or dietary supplements you use. Also tell them if you smoke, drink alcohol, or use illegal drugs. Some items may interact with your medicine. What should I watch for while using this medicine? Visit your doctor or health care professional for regular checks on your progress. Your doctor or health care professional may order blood tests and other tests to see how you are doing. Call your doctor or health care professional for advice if you get a fever, chills or sore throat, or other symptoms of a cold or flu. Do not treat yourself. This drug may decrease your body's ability to fight infection. Try to avoid being around people who are sick. You should make sure you get enough calcium and vitamin D while you are taking this medicine, unless your doctor tells  you not to. Discuss the foods you eat and the vitamins you take with your health care professional. See your dentist regularly. Brush and floss your teeth as directed. Before you have any dental work done, tell your dentist you are receiving this medicine. Do not become pregnant while taking this medicine or for 5 months after stopping it. Talk with your doctor or health care professional about your birth control options while taking this medicine. Women should inform their doctor if they wish to become pregnant or think they might be pregnant. There is a potential for serious side effects to an unborn child. Talk to your  health care professional or pharmacist for more information. What side effects may I notice from receiving this medicine? Side effects that you should report to your doctor or health care professional as soon as possible:  allergic reactions like skin rash, itching or hives, swelling of the face, lips, or tongue  bone pain  breathing problems  dizziness  jaw pain, especially after dental work  redness, blistering, peeling of the skin  signs and symptoms of infection like fever or chills; cough; sore throat; pain or trouble passing urine  signs of low calcium like fast heartbeat, muscle cramps or muscle pain; pain, tingling, numbness in the hands or feet; seizures  unusual bleeding or bruising  unusually weak or tired Side effects that usually do not require medical attention (report to your doctor or health care professional if they continue or are bothersome):  constipation  diarrhea  headache  joint pain  loss of appetite  muscle pain  runny nose  tiredness  upset stomach This list may not describe all possible side effects. Call your doctor for medical advice about side effects. You may report side effects to FDA at 1-800-FDA-1088. Where should I keep my medicine? This medicine is only given in a clinic, doctor's office, or other health care setting and will not be stored at home. NOTE: This sheet is a summary. It may not cover all possible information. If you have questions about this medicine, talk to your doctor, pharmacist, or health care provider.  2020 [employer] Standard (2017-06-20 16:10:44)  

## 2018-12-14 NOTE — Progress Notes (Signed)
[place]  [phone]) [phone] [phone]    ID: Caitlin Wall DOB: 01-May-1955  MR#: [id number]  ZSW#:[id number]  Patient Care Team: Biagio Borg, MD as PCP - General Tamala Julian Lynnell Dike, MD as PCP - Cardiology (Cardiology) Magrinat, Virgie Dad, MD as Consulting Physician (Oncology) Kyung Rudd, MD as Consulting Physician (Radiation Oncology) Jovita Kussmaul, MD as Consulting Physician (General Surgery) Sylvan Cheese, NP as Nurse Practitioner (Hematology and Oncology) OTHER MD: Keturah Barre MD   CHIEF COMPLAINT: Metastatic estrogen receptor positive breast cancer  CURRENT TREATMENT: Denosumab/Xgeva, fluvestrant, palbociclib   INTERVAL HISTORY: Caitlin Wall returns today for follow-up and treatment of her metastatic estrogen receptor positive breast cancer.   She continues on denosumab/Xgeva, with her most recent dose received on.11/16/2018  She tolerates this well, has no dental/oral issues, and takes extra calcium on day of injection.   She also continues on fulvestrant, with her most recent dose received on 11/16/2018.  She tolerates this well.   She also continues on palbociclib/Ibrance. She tolerates this well and without any noticeable side effects.   She is scheduled to stop on 12/19/2018.    At her last appointment, she had increased shortness of breath.  A stat CTA was done and showed no pulmonary embolus.  She was recommended to see her PCP and he gave her an IM steroid shot and prednisone script and she has been feeling better since.  She also started on a steroid inhaler.  Her breathing has much improved.    REVIEW OF SYSTEMS: Caitlin Wall has rib pain.  She takes Morphine 1.'3mg'$  about once a day to help with the pain. This is typically at night.  PMP aware reviewed.  This was last filled on 07/31/2018 and she is requesting a refill today. She is gargling with salt water and mouth wash to help prevent mucositis, ulcerations, and thrush.  She says she is  drinking plenty of water.  She denies any appetite loss or change.    Caitlin Wall is without fever or chills.  She has no new pain, nausea, vomiting, bowel/bladder changes.  She is without cough, shortness of breath, chest pain or palpitations.  A detailed ROS was otherwise non contributory.    BREAST CANCER HISTORY:  From the original intake note:   Caitlin Wall [facility]. She has subpectoral saline implants in place. There was a possible asymmetry in the left breast and she was recalled for left diagnostic mammography with tomosynthesis and left breast ultrasonography 11/03/2014. The breast density was category C. In the left breast lower outer quadrant there was an irregular mass which was not palpable and which by ultrasonography measured 0.6 cm. The left axilla was sonographically benign. Biopsy of this mass 11/09/2014 showed ([id number]) fibroadipose adipose tissue. This was felt to be discordant.  Accordingly the patient was referred to surgery and she underwent left breast radioactive seed localized lumpectomy 12/28/2014. The pathology from that procedure (([id number]) showed ductal carcinoma in situ, high-grade, measuring 0.2 cm. This was less than 0.1 cm from the posterior margin. The cells was estrogen receptor positive at 95%, with strong staining intensity, progesterone receptor positive at 30% with moderate staining intensity.  Her subsequent history is as detailed below. PAST MEDICAL HISTORY: Past Medical History:  Diagnosis Date   Asthma, persistent not controlled 07/27/2012   Breast cancer ([place]) 12/28/14   Left BresastDCIS   COLONIC POLYPS, HX OF 11/04/2007   COPD (chronic obstructive pulmonary  disease) ([place])    GERD 11/04/2007   GERD (gastroesophageal reflux disease)    HAIR LOSS 11/04/2007   Hearing loss    bilateral - wears hearing aids   Hx of cardiac cath 11/15/2016   a. LHC 11/15/16 showed normal coronaries and EF 50-55%  with normal EDP. (done for false positive NST)   Hx of colonic polyps 1995   adenomatous   Hyperlipidemia    HYPERLIPIDEMIA 11/04/2007   Hypertension    HYPERTENSION 06/07/2009   LBBB (left bundle branch block) 07/26/2011   no current problems   Personal history of radiation therapy    S/P radiation therapy 02/02/15-03/24/15   left breast 60.4GY    PAST SURGICAL HISTORY: Past Surgical History:  Procedure Laterality Date   ABDOMINAL HYSTERECTOMY     ANTERIOR FUSION CERVICAL SPINE     AUGMENTATION MAMMAPLASTY Bilateral    bilat ear surgury/hearing loss     wears hearing aids   BREAST LUMPECTOMY Left 2016   BREAST LUMPECTOMY WITH RADIOACTIVE SEED LOCALIZATION Left 12/28/2014   Procedure: BREAST LUMPECTOMY WITH RADIOACTIVE SEED LOCALIZATION;  Surgeon: Autumn Messing III, MD;  Location: [facility];  Service: General;  Laterality: Left;   COLONOSCOPY     LEFT HEART CATH AND CORONARY ANGIOGRAPHY N/A 11/15/2016   Procedure: LEFT HEART CATH AND CORONARY ANGIOGRAPHY;  Surgeon: Belva Crome, MD;  Location: [facility];  Service: Cardiovascular;  Laterality: N/A;   POLYPECTOMY     UPPER GASTROINTESTINAL ENDOSCOPY     WISDOM TOOTH EXTRACTION      FAMILY HISTORY: Family History  Problem Relation Age of Onset   Breast cancer Other    Cancer Paternal Grandmother        breast   Lung cancer Father    Breast cancer Maternal Grandmother    Healthy Mother    Coronary artery disease Neg Hx    Colon cancer Neg Hx    Pancreatic cancer Neg Hx    Rectal cancer Neg Hx    Stomach cancer Neg Hx    Esophageal cancer Neg Hx    [name] died from lung cancer in the setting of tobacco abuse at age 14. [name] died from "old age" at age 42. The patient had one brother, 3 sisters. The brother had" I cancer" requiring enucleation--the patient does not know whether this was a primary ocular melanoma. [name]  was diagnosed with breast cancer but the patient does not know at what age. There is no other history of breast or ovarian cancer in the family   GYNECOLOGIC HISTORY:  No LMP recorded (lmp unknown). Patient has had a hysterectomy. Menarche age 81, first live birth age 89. The patient is GX P3. She is status post hysterectomy without salpingo-oophorectomy. She did not take hormone replacement. She did use oral contraceptives remotely without any complications.   SOCIAL HISTORY:  Tracee works in accounts payable. Her husband [name] is disabled because of back problems. [name] is a Marine scientist working in rehabilitation in [place]. [name] lives in [place] where he works as a Games developer. [name] lives in [place] where he works as a Emergency planning/management officer. The patient has 4 grandchildren. She is not a Ambulance person.    ADVANCED DIRECTIVES: Not in place   HEALTH MAINTENANCE: Social History   Tobacco Use   Smoking status: Former Smoker    Packs/day: 1.00    Years: 20.00    Pack years: 20.00  Types: Cigarettes    Quit date: 08/19/1989    Years since quitting: 29.3   Smokeless tobacco: Never Used  Substance Use Topics   Alcohol use: Yes    Comment: Occasional   Drug use: No    Colonoscopy:  PAP:  Bone density:  Lipid panel:  Allergies  Allergen Reactions   Codeine     REACTION: nausea,   Tramadol Hcl Nausea Only    Current Outpatient Medications  Medication Sig Dispense Refill   amLODipine (NORVASC) 2.5 MG tablet Take 2.5 mg by mouth daily.     aspirin EC 81 MG tablet Take 81 mg by mouth daily.     budesonide-formoterol (SYMBICORT) 160-4.5 MCG/ACT inhaler Inhale 2 puffs into the lungs 2 (two) times daily. 1 Inhaler 3   cholecalciferol (VITAMIN D3) 25 MCG (1000 UT) tablet Take 1 tablet (1,000 Units total) by mouth daily.     COMBIVENT RESPIMAT 20-100 MCG/ACT AERS respimat INHALE 1 PUFF BY MOUTH EVERY 6 HOURS 4 g 0   IBRANCE 125 MG tablet TAKE  1 TABLET BY MOUTH ONCE DAILY WITH OR WITHOUT FOOD  FOR 21 DAYS, FOLLOWED BY 7  DAYS OFF, REPEATED EVERY 28 DAYS 21 tablet 0   losartan (COZAAR) 100 MG tablet Take 1 tablet by mouth once daily 30 tablet 0   morphine 10 MG/5ML solution Take 1.3-2.5 mLs (2.6-5 mg total) by mouth every 6 (six) hours as needed for severe pain. 30 mL 0   palbociclib (IBRANCE) 125 MG capsule Take 1 capsule (125 mg total) by mouth daily. Take after lunch. Take for 21 days on, 7 days off, repeat every 28 days. 21 capsule 6   pantoprazole (PROTONIX) 20 MG tablet Take 1 tablet (20 mg total) by mouth daily. 30 tablet 0   predniSONE (DELTASONE) 10 MG tablet 4tab x 3 day,3day x 3day,2tab x 3day,1tab x 3day 30 tablet 0   Spacer/Aero-Holding Chambers DEVI Spacer to be used with inhaler 1 each 0   ondansetron (ZOFRAN) 8 MG tablet Take 1 tablet (8 mg total) by mouth every 8 (eight) hours as needed for nausea or vomiting. (Patient not taking: Reported on 12/14/2018) 20 tablet 0   prochlorperazine (COMPAZINE) 25 MG suppository Place 1 suppository (25 mg total) rectally every 12 (twelve) hours as needed for nausea or vomiting. (Patient not taking: Reported on 12/14/2018) 12 suppository 1   promethazine (PHENERGAN) 25 MG suppository Place 1 suppository (25 mg total) rectally every 6 (six) hours as needed for nausea or vomiting. (Patient not taking: Reported on 12/14/2018) 12 each 0   valACYclovir (VALTREX) 500 MG tablet Take 1 tablet (500 mg total) by mouth 2 (two) times daily. (Patient not taking: Reported on 12/14/2018) 60 tablet 1   No current facility-administered medications for this visit.     OBJECTIVE:  Vitals:   12/14/18 1123  BP: 97/63  Pulse: 78  Resp: 17  Temp: 98.3 F (36.8 C)  SpO2: 98%     Body mass index is 30.61 kg/m.    ECOG FS:1 - Symptomatic but completely ambulatory  Filed Weights   12/14/18 1123  Weight: 172 lb 12.8 oz (78.4 kg)  GENERAL: Patient is a well appearing female in no acute  distress HEENT:  Sclerae anicteric.  Oropharynx clear and moist.  No evidence of ulcerations or oropharyngeal candidiasis. Neck is supple. No sinus tenderness NODES:  No cervical, supraclavicular, or axillary lymphadenopathy palpated.  BREAST EXAM:  Left breast s/p lumpectomy and radiation, no sign of local recurrence, right  breast benign LUNGS:  Clear to auscultation bilaterally.  No wheezes or rhonchi. HEART:  Regular rate and rhythm. No murmur appreciated. ABDOMEN:  Soft, nontender.  Positive, normoactive bowel sounds. No organomegaly palpated. MSK:  No focal spinal tenderness to palpation. Full range of motion bilaterally in the upper extremities. EXTREMITIES:  No peripheral edema.   SKIN:  Clear with no obvious rashes or skin changes. No nail dyscrasia. NEURO:  Nonfocal. Well oriented.  Anxious and tearful when talking about shortness of breath.   LAB RESULTS:  CMP     Component Value Date/Time   NA 142 12/14/2018 1106   NA 141 04/25/2017 1213   NA 145 07/04/2016 1133   K 4.3 12/14/2018 1106   K 4.2 07/04/2016 1133   CL 103 12/14/2018 1106   CO2 28 12/14/2018 1106   CO2 28 07/04/2016 1133   GLUCOSE 83 12/14/2018 1106   GLUCOSE 101 07/04/2016 1133   BUN 25 (H) 12/14/2018 1106   BUN 18 04/25/2017 1213   BUN 16.0 07/04/2016 1133   CREATININE 1.33 (H) 12/14/2018 1106   CREATININE 1.09 (H) 07/23/2018 1339   CREATININE 0.9 07/04/2016 1133   CALCIUM 9.9 12/14/2018 1106   CALCIUM 9.9 07/04/2016 1133   PROT 6.7 12/14/2018 1106   PROT 7.3 07/04/2016 1133   ALBUMIN 3.5 12/14/2018 1106   ALBUMIN 3.8 07/04/2016 1133   AST 24 12/14/2018 1106   AST 35 07/23/2018 1339   AST 19 07/04/2016 1133   ALT 22 12/14/2018 1106   ALT 25 07/23/2018 1339   ALT 17 07/04/2016 1133   ALKPHOS 90 12/14/2018 1106   ALKPHOS 172 (H) 07/04/2016 1133   BILITOT 1.1 12/14/2018 1106   BILITOT 1.1 07/23/2018 1339   BILITOT 0.94 07/04/2016 1133   GFRNONAA 43 (L) 12/14/2018 1106   GFRNONAA 55 (L)  07/23/2018 1339   GFRAA 50 (L) 12/14/2018 1106   GFRAA >60 07/23/2018 1339    INo results found for: SPEP, UPEP  Lab Results  Component Value Date   WBC 6.4 12/14/2018   NEUTROABS 5.1 12/14/2018   HGB 13.7 12/14/2018   HCT 40.3 12/14/2018   MCV 108.3 (H) 12/14/2018   PLT 151 12/14/2018      Chemistry      Component Value Date/Time   NA 142 12/14/2018 1106   NA 141 04/25/2017 1213   NA 145 07/04/2016 1133   K 4.3 12/14/2018 1106   K 4.2 07/04/2016 1133   CL 103 12/14/2018 1106   CO2 28 12/14/2018 1106   CO2 28 07/04/2016 1133   BUN 25 (H) 12/14/2018 1106   BUN 18 04/25/2017 1213   BUN 16.0 07/04/2016 1133   CREATININE 1.33 (H) 12/14/2018 1106   CREATININE 1.09 (H) 07/23/2018 1339   CREATININE 0.9 07/04/2016 1133      Component Value Date/Time   CALCIUM 9.9 12/14/2018 1106   CALCIUM 9.9 07/04/2016 1133   ALKPHOS 90 12/14/2018 1106   ALKPHOS 172 (H) 07/04/2016 1133   AST 24 12/14/2018 1106   AST 35 07/23/2018 1339   AST 19 07/04/2016 1133   ALT 22 12/14/2018 1106   ALT 25 07/23/2018 1339   ALT 17 07/04/2016 1133   BILITOT 1.1 12/14/2018 1106   BILITOT 1.1 07/23/2018 1339   BILITOT 0.94 07/04/2016 1133     No results found for: LABCA2  No components found for: LABCA125  No results for input(s): INR in the last 168 hours.  Urinalysis    Component Value Date/Time  COLORURINE YELLOW 07/20/2018 1747   APPEARANCEUR CLEAR 07/20/2018 1747   LABSPEC 1.019 07/20/2018 1747   PHURINE 5.0 07/20/2018 1747   GLUCOSEU NEGATIVE 07/20/2018 1747   GLUCOSEU NEGATIVE 10/20/2017 1303   HGBUR NEGATIVE 07/20/2018 1747   BILIRUBINUR NEGATIVE 07/20/2018 1747   KETONESUR 20 (A) 07/20/2018 1747   PROTEINUR NEGATIVE 07/20/2018 1747   UROBILINOGEN 1.0 10/20/2017 1303   NITRITE NEGATIVE 07/20/2018 1747   LEUKOCYTESUR NEGATIVE 07/20/2018 1747    STUDIES: Ct Angio Chest Pe W Or Wo Contrast  Result Date: 11/16/2018 CLINICAL DATA:  Shortness of breath. EXAM: CT ANGIOGRAPHY  CHEST WITH CONTRAST TECHNIQUE: Multidetector CT imaging of the chest was performed using the standard protocol during bolus administration of intravenous contrast. Multiplanar CT image reconstructions and MIPs were obtained to evaluate the vascular anatomy. CONTRAST:  14m OMNIPAQUE IOHEXOL 350 MG/ML SOLN COMPARISON:  CT scan of November 11, 2018. FINDINGS: Cardiovascular: Satisfactory opacification of the pulmonary arteries to the segmental level. No evidence of pulmonary embolism. Normal heart size. No pericardial effusion. Mediastinum/Nodes: No enlarged mediastinal, hilar, or axillary lymph nodes. Thyroid gland, trachea, and esophagus demonstrate no significant findings. Lungs/Pleura: No pneumothorax or pleural effusion is noted. Stable emphysematous disease is noted, most prominently seen in right upper lobe. Stable bilateral lower lobe opacities are noted in the retro hilar regions most consistent with post radiation change. Upper Abdomen: No acute abnormality. Musculoskeletal: Stable sclerotic lesions are seen throughout the visualized skeleton, most prominently seen in the manubrium. This is consistent with metastatic disease. Review of the MIP images confirms the above findings. IMPRESSION: No definite evidence of pulmonary embolus. Stable bilateral lower lobe opacities are noted in the retro hilar regions most consistent with post radiation change. Stable sclerotic lesions are noted throughout the visualized skeleton consistent with osseous metastases. Aortic Atherosclerosis (ICD10-I70.0) and Emphysema (ICD10-J43.9). Electronically Signed   By: JMarijo ConceptionM.D.   On: 11/16/2018 11:37     ASSESSMENT: 63y.o. [place] woman status post left lumpectomy 12/28/2014 for ductal carcinoma in situ, high-grade measuring 0.2 cm, estrogen and progesterone receptor positive, with close but negative margins  (1) adjuvant radiation completed 03/24/2015  (2) anastrozole started 04/19/2015   (a) bone  density 03/01/2016 shows a T score of -1.2  (3) mildly abnormal hepatic function panel: negative workup for hepatitis B and C, normal alpha-1-anti-trypsin  (4) left lower extremity DVT diagnosed by Doppler ultrasonography 04/16/2016, with negative CT angiogram chest  (a) on Xarelto starting 04/16/2016   (b) negative hypercoagulable panel, with a minimally abnormal (indeterminate) anti-cardiolipin IgM  ((c) stopped rivaroxaban 07/16/2016, repeat D-Dimer WNL August 2018  METASTATIC DISEASE: December 2019: bone only (5) CT chest 02/06/2018 shows a mixed lytic and sclerotic process with fracture at the inferior manubrium but no other bone lesions.  However bone scan 02/16/2018 shows multiple bone lesions consistent with metastatic disease; no lung or pleura involvement, no adenopathy  (a) Bone marrow biopsy on 03/03/2018 shows metastatic carcinoma consistent with breast primary, estrogen and progesterone receptor strongly positive, HER-2 not amplified (1+)  (b) MRI abdomen and MRI brain at [facility] (results viewable in "care everywhere") notes known bone involvement in spine and pelvis, but no other area of metastatic disease in abdomen/pelvis, or brain.  (c) CA 27.29 not informative (37.9 on 03/03/2018)  (6) started denosumab/Xgeva on 03/18/2018, repeated Q28 d  (7) started fulvestrant on 03/03/2018 and palbociclib 03/18/2018 at 125 mg/d 21/7  (a) bone scan 06/10/2018 serves as our new baseline  (8) palliative radiation completed  07/14/2018  (a) esophagitis secondary to radiation has resolved   PLAN: Caitlin Wall is doing much better today than she was at her last appointment. Her COPD exacerbation has resolved and her breathing is improved.   She has no signs of clinical progression today.  She will continue on her current treatment with Xgeva, Fulvestrant and Palbociclib as she is tolerating it well.    Jolynn will continue taking the Morphine as needed for pain.  This is managing her pain, keeping  her activity level, and she is not having any bad side effects.  PMP aware was reviewed today, and I placed orders for a refill for her have.    Raihana will return in 4 weeks for labs, f/u, and injections.  She was recommended to continue with the appropriate pandemic precautions. She knows to call for any questions that may arise between now and her next appointment.  We are happy to see her sooner if needed.  A total of (20) minutes of face-to-face time was spent with this patient with greater than 50% of that time in counseling and care-coordination.    Wilber Bihari, NP  12/14/18 11:54 AM Medical Oncology and Hematology [place], [place] [address] Tel. [phone]    Fax. [phone]

## 2018-12-15 ENCOUNTER — Encounter: Payer: Self-pay | Admitting: Internal Medicine

## 2018-12-15 ENCOUNTER — Telehealth: Payer: Self-pay | Admitting: Adult Health

## 2018-12-15 ENCOUNTER — Ambulatory Visit (INDEPENDENT_AMBULATORY_CARE_PROVIDER_SITE_OTHER): Payer: 59 | Admitting: Internal Medicine

## 2018-12-15 DIAGNOSIS — J4530 Mild persistent asthma, uncomplicated: Secondary | ICD-10-CM | POA: Diagnosis not present

## 2018-12-15 NOTE — Progress Notes (Signed)
Caitlin Wall, female    DOB: [birth year],    MRN: [id number]   Brief patient profile:  74 yowf quit smoking in 1991 with no resp when stopped smoking and with nl spirometry in 2014 and followed by Dr Annamaria Boots with dx of asthma and maint on nothing and did fine until Sept 2020 with worse cough/sob rx combivent twice daily but then given symbicort/pred x 12 days starting 12/01/18 by Dr Jenny Reichmann and continued the combivent as well and referred to pulmonary clinic 12/15/2018 by Dr Jenny Reichmann.      History of Present Illness  12/15/2018  Pulmonary/ 1st office eval/Caitlin Wall maint on symb 160 poor hfa/ finished pred 10/18 and no worse off  Chief Complaint  Patient presents with  . Pulmonary Consult    Referred by Dr. [name].  Pt c/o SOB for the past month. She states she gets SOB walking short distances such as room to room. She has noticed some improvement with recent prednisone taper. She uses combivent 2 x daily on average.   Dyspnea:  MMRC3 = can't walk 100 yards even at a slow pace at a flat grade s stopping due to sob uses [facility] parking  (was room to room prior to prednisoe)  Cough: minimal now  Sleep: on side / flat bed one pillow  SABA use: bid (not prn)  No obvious day to day or daytime variability or assoc excess/ purulent sputum or mucus plugs or hemoptysis or cp or chest tightness, subjective wheeze or overt sinus or hb symptoms.   Sleeping  without nocturnal  or early am exacerbation  of respiratory  c/o's or need for noct saba. Also denies any obvious fluctuation of symptoms with weather or environmental changes or other aggravating or alleviating factors except as outlined above   No unusual exposure hx or h/o childhood pna/ asthma or knowledge of premature birth.  Current Allergies, Complete Past Medical History, Past Surgical History, Family History, and Social History were reviewed in [employer] record.  ROS  The following are not active complaints unless bolded  Hoarseness, sore throat, dysphagia, dental problems, itching, sneezing,  nasal congestion or discharge of excess mucus or purulent secretions, ear ache,   fever, chills, sweats, unintended wt loss or wt gain, classically pleuritic or exertional cp,  orthopnea pnd or arm/hand swelling  or leg swelling, presyncope, palpitations, abdominal pain, anorexia, nausea, vomiting, diarrhea  or change in bowel habits or change in bladder habits, change in stools or change in urine, dysuria, hematuria,  rash, arthralgias, visual complaints, headache, numbness, weakness or ataxia or problems with walking or coordination,  change in mood or  memory.           Past Medical History:  Diagnosis Date  . Asthma, persistent not controlled 07/27/2012  . Breast cancer ([place]) 12/28/14   Left BresastDCIS  . COLONIC POLYPS, HX OF 11/04/2007  . COPD (chronic obstructive pulmonary disease) ([place])   . GERD 11/04/2007  . GERD (gastroesophageal reflux disease)   . HAIR LOSS 11/04/2007  . Hearing loss    bilateral - wears hearing aids  . Hx of cardiac cath 11/15/2016   a. LHC 11/15/16 showed normal coronaries and EF 50-55% with normal EDP. (done for false positive NST)  . Hx of colonic polyps 1995   adenomatous  . Hyperlipidemia   . HYPERLIPIDEMIA 11/04/2007  . Hypertension   . HYPERTENSION 06/07/2009  . LBBB (left bundle branch block) 07/26/2011   no current problems  .  Personal history of radiation therapy   . S/P radiation therapy 02/02/15-03/24/15   left breast 60.4GY    Outpatient Medications Prior to Visit  Medication Sig Dispense Refill  . amLODipine (NORVASC) 2.5 MG tablet Take 2.5 mg by mouth daily.    Marland Kitchen aspirin EC 81 MG tablet Take 81 mg by mouth daily.    . budesonide-formoterol (SYMBICORT) 160-4.5 MCG/ACT inhaler Inhale 2 puffs into the lungs 2 (two) times daily. 1 Inhaler 3  . cholecalciferol (VITAMIN D3) 25 MCG (1000 UT) tablet Take 1 tablet (1,000 Units total) by mouth daily.    . COMBIVENT RESPIMAT 20-100  MCG/ACT AERS respimat INHALE 1 PUFF BY MOUTH EVERY 6 HOURS 4 g 0  . IBRANCE 125 MG tablet TAKE 1 TABLET BY MOUTH ONCE DAILY WITH OR WITHOUT FOOD  FOR 21 DAYS, FOLLOWED BY 7  DAYS OFF, REPEATED EVERY 28 DAYS 21 tablet 0  . losartan (COZAAR) 100 MG tablet Take 1 tablet by mouth once daily 30 tablet 0  . morphine 10 MG/5ML solution Take 1.3-2.5 mLs (2.6-5 mg total) by mouth every 6 (six) hours as needed for severe pain. 30 mL 0  . ondansetron (ZOFRAN) 8 MG tablet Take 1 tablet (8 mg total) by mouth every 8 (eight) hours as needed for nausea or vomiting. 20 tablet 0  . pantoprazole (PROTONIX) 20 MG tablet Take 1 tablet (20 mg total) by mouth daily. 30 tablet 0  . prochlorperazine (COMPAZINE) 25 MG suppository Place 1 suppository (25 mg total) rectally every 12 (twelve) hours as needed for nausea or vomiting. (Patient not taking: Reported on 12/14/2018) 12 suppository 1  . Spacer/Aero-Holding Chambers DEVI Spacer to be used with inhaler 1 each 0  . valACYclovir (VALTREX) 500 MG tablet Take 1 tablet (500 mg total) by mouth 2 (two) times daily. (Patient not taking: Reported on 12/14/2018) 60 tablet 1      Objective:     BP 116/74 (BP Location: Left Arm, Cuff Size: Normal)   Pulse 85   Temp 97.7 F (36.5 C) (Temporal)   Ht 5\' 5"  (1.651 m)   Wt 173 lb 9.6 oz (78.7 kg)   LMP  (LMP Unknown)   SpO2 100%   BMI 28.89 kg/m   SpO2: 100 %  RA    HEENT : pt wearing mask not removed for exam due to covid - 19 concerns.    NECK :  without JVD/Nodes/TM/ nl carotid upstrokes bilaterally   LUNGS: no acc muscle use,  Mild barrel  contour chest wall with bilateral  Distant bs s audible wheeze and  without cough on insp or exp maneuvers  and mild  Hyperresonant  to  percussion bilaterally     CV:  RRR  no s3 or murmur or increase in P2, and no edema   ABD:  soft and nontender with pos end  insp Hoover's  in the supine position. No bruits or organomegaly appreciated, bowel sounds nl  MS:   Nl gait/   ext warm without deformities, calf tenderness, cyanosis or clubbing No obvious joint restrictions   SKIN: warm and dry without lesions    NEURO:  alert, approp, nl sensorium with  no motor or cerebellar deficits apparent.          I personally reviewed images and agree with radiology impression as follows:  Chest CTa  11/16/2018 No definite evidence of pulmonary embolus. Stable bilateral lower lobe opacities are noted in the retro hilar regions most consistent with post radiation change.  Stable sclerotic lesions are noted throughout the visualized skeleton consistent with osseous metastases.     Assessment   Chronic asthma, mild persistent, uncomplicated Quit smoking 1991 -  PFT's  08/11/12   FEV1 2.70 (100 % ) ratio 0.75  p 6 % improvement from saba p ? prior to study with DLCO  21.44 (88%) corrects to 4.02 (83%)  for alv volume and FV curve very min concavity  - 12/15/2018  After extensive coaching inhaler device,  effectiveness =   25% (very short Ti) > continue symb 160 2bid and combivent prn - 12/15/2018   Walked RA  2 laps @  approx 226ft each @ avg pace  stopped due to  End of study with sats 89-90%     This is not copd. Very robust response to prednisone suggests asthma though doesn't prove it - on the other hand she has no other obvious cause for her doe / cough by hx or CTa  and hasn't really tried symbicort 160 at effective technique so best bet is empirical rx for asthma for now and see if can master hfa or consider elipta laba/ics / smi sama/saba prn  At next ov    >>>>  Ov @ 4 weeks with inhalers in hand     Total time devoted to counseling  > 50 % of initial 60 min office visit:  review case with pt/husband/  directly observed portions of ambulatory 02 saturation study/   discussion of options/alternatives/ personally creating written customized instructions  in presence of pt  then going over those specific  Instructions directly with the pt including how to use all  of the meds but in particular covering each new medication in detail and the difference between the maintenance= "automatic" meds and the prns using an action plan format for the latter (If this problem/symptom => do that organization reading Left to right).  Please see AVS from this visit for a full list of these instructions which I personally wrote for this pt and  are unique to this visit.      Christinia Gully, MD 12/15/2018

## 2018-12-15 NOTE — Patient Instructions (Addendum)
Plan A = Automatic = Always=   Symbicort 160 Take 2 puffs first thing in am and then another 2 puffs about 12 hours later.   Work on inhaler technique:  relax and gently blow all the way out then take a nice smooth deep breath back in, triggering the inhaler at same time you start breathing in.  Hold for up to 5 seconds if you can. Blow out thru nose. Rinse and gargle with water when done   Plan B = Backup (to supplement plan A, not to replace it) Only use your combivent  inhaler as a rescue medication to be used if you can't catch your breath by resting or doing a relaxed purse lip breathing pattern.  - The less you use it, the better it will work when you need it. - Ok to use the inhaler up to 1 puffs  every 4 hours if you must but call for appointment if use goes up over your usual need - Don't leave home without it !!  (think of it like the spare tire for your car)    Please schedule a follow up office visit in 4 weeks, sooner if needed

## 2018-12-15 NOTE — Telephone Encounter (Signed)
Scheduled appt per 10/19 los.  Was not able to get a hold of the pt..the pt will get a printout at their next scheduled appt in Nov.

## 2018-12-15 NOTE — Assessment & Plan Note (Addendum)
Quit smoking 1991 -  PFT's  08/11/12   FEV1 2.70 (100 % ) ratio 0.75  p 6 % improvement from saba p ? prior to study with DLCO  21.44 (88%) corrects to 4.02 (83%)  for alv volume and FV curve very min concavity  - 12/15/2018  After extensive coaching inhaler device,  effectiveness =   25% (very short Ti) > continue symb 160 2bid and combivent prn - 12/15/2018   Walked RA  2 laps @  approx 268ft each @ avg pace  stopped due to  End of study with sats 89-90%     This is not copd.   Very robust response to prednisone suggests asthma though doesn't prove it - on the other hand she has no other obvious cause for her doe / cough by hx or CTa  and hasn't really tried symbicort 160 at effective technique so best bet is empirical rx for asthma for now and see if can master hfa or consider elipta laba/ics / smi sama/saba prn  At next ov     >>>>  Ov @ 4 weeks with inhalers in hand   Total time devoted to counseling  > 50 % of initial 60 min office visit:  review case with pt/husband/  directly observed portions of ambulatory 02 saturation study/  performed device teaching  using a teach back technique which also  extended face to face time for this visit (see above)  discussion of options/alternatives/ personally creating written customized instructions  in presence of pt  then going over those specific  Instructions directly with the pt including how to use all of the meds but in particular covering each new medication in detail and the difference between the maintenance= "automatic" meds and the prns using an action plan format for the latter (If this problem/symptom => do that organization reading Left to right).  Please see AVS from this visit for a full list of these instructions which I personally wrote for this pt and  are unique to this visit.

## 2018-12-30 ENCOUNTER — Other Ambulatory Visit: Payer: Self-pay

## 2018-12-30 ENCOUNTER — Ambulatory Visit (INDEPENDENT_AMBULATORY_CARE_PROVIDER_SITE_OTHER): Payer: 59 | Admitting: Internal Medicine

## 2018-12-30 ENCOUNTER — Encounter: Payer: Self-pay | Admitting: Internal Medicine

## 2018-12-30 DIAGNOSIS — I10 Essential (primary) hypertension: Secondary | ICD-10-CM

## 2018-12-30 DIAGNOSIS — N183 Chronic kidney disease, stage 3 unspecified: Secondary | ICD-10-CM | POA: Diagnosis not present

## 2018-12-30 DIAGNOSIS — K219 Gastro-esophageal reflux disease without esophagitis: Secondary | ICD-10-CM

## 2018-12-30 DIAGNOSIS — J4520 Mild intermittent asthma, uncomplicated: Secondary | ICD-10-CM

## 2018-12-30 DIAGNOSIS — E785 Hyperlipidemia, unspecified: Secondary | ICD-10-CM

## 2018-12-30 NOTE — Assessment & Plan Note (Signed)
stable overall by history and exam, recent data reviewed with pt, and pt to continue medical treatment as before,  to f/u any worsening symptoms or concerns  

## 2018-12-30 NOTE — Progress Notes (Signed)
Subjective:    Patient ID: Caitlin Wall, female    DOB: [birth year], 63 y.o.   MRN: [id number]  HPI  Here to f/u; overall doing ok,  Pt denies chest pain, increasing sob or doe, wheezing, orthopnea, PND, increased LE swelling, palpitations, dizziness or syncope.  Pt denies new neurological symptoms such as new headache, or facial or extremity weakness or numbness.  Pt denies polydipsia, polyuria, or low sugar episode.  Pt states overall good compliance with meds, mostly trying to follow appropriate diet, with wt overall stable,  but little exercise however.  Breathing improved wih tx steroids and inhaler, has f/u appt with Dr Wende Mott soon.  No new complaints.  Denies worsening reflux, abd pain, dysphagia, n/v, bowel change or blood. Past Medical History:  Diagnosis Date  . Asthma, persistent not controlled 07/27/2012  . Breast cancer ([place]) 12/28/14   Left BresastDCIS  . COLONIC POLYPS, HX OF 11/04/2007  . COPD (chronic obstructive pulmonary disease) ([place])   . GERD 11/04/2007  . GERD (gastroesophageal reflux disease)   . HAIR LOSS 11/04/2007  . Hearing loss    bilateral - wears hearing aids  . Hx of cardiac cath 11/15/2016   a. LHC 11/15/16 showed normal coronaries and EF 50-55% with normal EDP. (done for false positive NST)  . Hx of colonic polyps 1995   adenomatous  . Hyperlipidemia   . HYPERLIPIDEMIA 11/04/2007  . Hypertension   . HYPERTENSION 06/07/2009  . LBBB (left bundle branch block) 07/26/2011   no current problems  . Personal history of radiation therapy   . S/P radiation therapy 02/02/15-03/24/15   left breast 60.4GY   Past Surgical History:  Procedure Laterality Date  . ABDOMINAL HYSTERECTOMY    . ANTERIOR FUSION CERVICAL SPINE    . AUGMENTATION MAMMAPLASTY Bilateral   . bilat ear surgury/hearing loss     wears hearing aids  . BREAST LUMPECTOMY Left 2016  . BREAST LUMPECTOMY WITH RADIOACTIVE SEED LOCALIZATION Left 12/28/2014   Procedure: BREAST LUMPECTOMY WITH  RADIOACTIVE SEED LOCALIZATION;  Surgeon: Autumn Messing III, MD;  Location: [facility];  Service: General;  Laterality: Left;  . COLONOSCOPY    . LEFT HEART CATH AND CORONARY ANGIOGRAPHY N/A 11/15/2016   Procedure: LEFT HEART CATH AND CORONARY ANGIOGRAPHY;  Surgeon: Belva Crome, MD;  Location: [facility];  Service: Cardiovascular;  Laterality: N/A;  . POLYPECTOMY    . UPPER GASTROINTESTINAL ENDOSCOPY    . WISDOM TOOTH EXTRACTION      reports that she quit smoking about 29 years ago. Her smoking use included cigarettes. She has a 20.00 pack-year smoking history. She has never used smokeless tobacco. She reports current alcohol use. She reports that she does not use drugs. family history includes Breast cancer in her maternal grandmother and another family member; Cancer in her paternal grandmother; Healthy in her mother; Lung cancer in her father. Allergies  Allergen Reactions  . Codeine     REACTION: nausea,  . Tramadol Hcl Nausea Only   Current Outpatient Medications on File Prior to Visit  Medication Sig Dispense Refill  . amLODipine (NORVASC) 2.5 MG tablet Take 2.5 mg by mouth daily.    Marland Kitchen aspirin EC 81 MG tablet Take 81 mg by mouth daily.    . budesonide-formoterol (SYMBICORT) 160-4.5 MCG/ACT inhaler Inhale 2 puffs into the lungs 2 (two) times daily. 1 Inhaler 3  . cholecalciferol (VITAMIN D3) 25 MCG (1000 UT) tablet Take 1 tablet (1,000 Units total) by mouth  daily.    . COMBIVENT RESPIMAT 20-100 MCG/ACT AERS respimat INHALE 1 PUFF BY MOUTH EVERY 6 HOURS 4 g 0  . IBRANCE 125 MG tablet TAKE 1 TABLET BY MOUTH ONCE DAILY WITH OR WITHOUT FOOD  FOR 21 DAYS, FOLLOWED BY 7  DAYS OFF, REPEATED EVERY 28 DAYS 21 tablet 0  . losartan (COZAAR) 100 MG tablet Take 1 tablet by mouth once daily 30 tablet 0  . morphine 10 MG/5ML solution Take 1.3-2.5 mLs (2.6-5 mg total) by mouth every 6 (six) hours as needed for severe pain. 30 mL 0   No current facility-administered medications on  file prior to visit.    Review of Systems  Constitutional: Negative for other unusual diaphoresis or sweats HENT: Negative for ear discharge or swelling Eyes: Negative for other worsening visual disturbances Respiratory: Negative for stridor or other swelling  Gastrointestinal: Negative for worsening distension or other blood Genitourinary: Negative for retention or other urinary change Musculoskeletal: Negative for other MSK pain or swelling Skin: Negative for color change or other new lesions Neurological: Negative for worsening tremors and other numbness  Psychiatric/Behavioral: Negative for worsening agitation or other fatigue All otherwise neg per pt    Objective:   Physical Exam .BP 118/78   Pulse 99   Temp 98.6 F (37 C) (Oral)   Ht 5\' 5"  (1.651 m)   Wt 173 lb (78.5 kg)   LMP  (LMP Unknown)   SpO2 95%   BMI 28.79 kg/m  VS noted,  Constitutional: Pt appears in NAD HENT: Head: NCAT.  Right Ear: External ear normal.  Left Ear: External ear normal.  Eyes: . Pupils are equal, round, and reactive to light. Conjunctivae and EOM are normal Nose: without d/c or deformity Neck: Neck supple. Gross normal ROM Cardiovascular: Normal rate and regular rhythm.   Pulmonary/Chest: Effort normal and breath sounds without rales or wheezing.  Abd:  Soft, NT, ND, + BS, no organomegaly Neurological: Pt is alert. At baseline orientation, motor grossly intact Skin: Skin is warm. No rashes, other new lesions, no LE edema Psychiatric: Pt behavior is normal without agitation  All otherwise neg per pt Lab Results  Component Value Date   WBC 6.4 12/14/2018   HGB 13.7 12/14/2018   HCT 40.3 12/14/2018   PLT 151 12/14/2018   GLUCOSE 83 12/14/2018   CHOL 164 10/20/2017   TRIG 99.0 10/20/2017   HDL 39.20 10/20/2017   LDLCALC 105 (H) 10/20/2017   ALT 22 12/14/2018   AST 24 12/14/2018   NA 142 12/14/2018   K 4.3 12/14/2018   CL 103 12/14/2018   CREATININE 1.33 (H) 12/14/2018   BUN 25 (H)  12/14/2018   CO2 28 12/14/2018   TSH 1.71 10/20/2017   INR 1.0 11/12/2016          Assessment & Plan:

## 2018-12-30 NOTE — Patient Instructions (Addendum)
Please continue all other medications as before, and refills have been done if requested.  Please have the pharmacy call with any other refills you may need.  Please continue your efforts at being more active, low cholesterol diet, and weight control.  Please keep your appointments with your specialists as you may have planned  Please return in 6 months, or sooner if needed 

## 2018-12-31 ENCOUNTER — Telehealth: Payer: Self-pay | Admitting: Oncology

## 2018-12-31 ENCOUNTER — Other Ambulatory Visit: Payer: Self-pay | Admitting: Oncology

## 2018-12-31 NOTE — Telephone Encounter (Signed)
Returned patient's phone call regarding, per patient's request 1/11 appointment has moved to 1/12.

## 2019-01-10 NOTE — Progress Notes (Signed)
[place]  Telephone:([phone] Fax:(336) [phone]    ID: Caitlin Wall DOB: 04-01-1955  MR#: [id number]  JYN#:[id number]  Patient Care Team: Biagio Borg, MD as PCP - General Tamala Julian Lynnell Dike, MD as PCP - Cardiology (Cardiology) Denece Shearer, Virgie Dad, MD as Consulting Physician (Oncology) Kyung Rudd, MD as Consulting Physician (Radiation Oncology) Jovita Kussmaul, MD as Consulting Physician (General Surgery) Sylvan Cheese, NP as Nurse Practitioner (Hematology and Oncology) Tanda Rockers, MD as Consulting Physician (Pulmonary Disease) OTHER MD: Keturah Barre MD   CHIEF COMPLAINT: Metastatic estrogen receptor positive breast cancer  CURRENT TREATMENT: Denosumab/Xgeva, fluvestrant, palbociclib   INTERVAL HISTORY: Caitlin Wall returns today for follow-up and treatment of her metastatic estrogen receptor positive breast cancer.   She continues on denosumab/Xgeva, with a dose due today.  She tolerates this well, has no dental/oral issues, and knows to take extra calcium on day of injection.   She also continues on fulvestrant, with her most recent dose received on 12/14/2018.  She tolerates this well.  She feels a little bit of soreness in the buttocks for perhaps today but otherwise has no complaints regarding this medication  She also continues on palbociclib/Ibrance. She tolerates this well.  She is obtaining it at no cost.   REVIEW OF SYSTEMS: Caitlin Wall feels tired.  Partly this is lack of exercise, partly the Ibrance, partly the fact that we recently changed to 4 hours so that by 5 or 6 PM is getting dark and that makes people depressed.  She is also disappointed although not stressing she says over the election results.  Of course she is also anxious regarding her diagnosis of cancer.  Her bone pain is currently well controlled on ibuprofen which she takes between 2 and 4 times a day.  She is mildly constipated.  She has leg cramps.  Sometimes she feels cold.  A  detailed review of systems today was otherwise stable  BREAST CANCER HISTORY:  From the original intake note:   Terea [facility]. She has subpectoral saline implants in place. There was a possible asymmetry in the left breast and she was recalled for left diagnostic mammography with tomosynthesis and left breast ultrasonography 11/03/2014. The breast density was category C. In the left breast lower outer quadrant there was an irregular mass which was not palpable and which by ultrasonography measured 0.6 cm. The left axilla was sonographically benign. Biopsy of this mass 11/09/2014 showed ([id number]) fibroadipose adipose tissue. This was felt to be discordant.  Accordingly the patient was referred to surgery and she underwent left breast radioactive seed localized lumpectomy 12/28/2014. The pathology from that procedure (([id number]) showed ductal carcinoma in situ, high-grade, measuring 0.2 cm. This was less than 0.1 cm from the posterior margin. The cells was estrogen receptor positive at 95%, with strong staining intensity, progesterone receptor positive at 30% with moderate staining intensity.  Her subsequent history is as detailed below. PAST MEDICAL HISTORY: Past Medical History:  Diagnosis Date   Asthma, persistent not controlled 07/27/2012   Breast cancer ([facility]) 12/28/14   Left BresastDCIS   COLONIC POLYPS, HX OF 11/04/2007   COPD (chronic obstructive pulmonary disease) (HCC)    GERD 11/04/2007   GERD (gastroesophageal reflux disease)    HAIR LOSS 11/04/2007   Hearing loss    bilateral - wears hearing aids   Hx of cardiac cath 11/15/2016   a. LHC 11/15/16 showed normal coronaries and EF  50-55% with normal EDP. (done for false positive NST)   Hx of colonic polyps 1995   adenomatous   Hyperlipidemia    HYPERLIPIDEMIA 11/04/2007   Hypertension    HYPERTENSION 06/07/2009   LBBB (left bundle branch block) 07/26/2011    no current problems   Personal history of radiation therapy    S/P radiation therapy 02/02/15-03/24/15   left breast 60.4GY    PAST SURGICAL HISTORY: Past Surgical History:  Procedure Laterality Date   ABDOMINAL HYSTERECTOMY     ANTERIOR FUSION CERVICAL SPINE     AUGMENTATION MAMMAPLASTY Bilateral    bilat ear surgury/hearing loss     wears hearing aids   BREAST LUMPECTOMY Left 2016   BREAST LUMPECTOMY WITH RADIOACTIVE SEED LOCALIZATION Left 12/28/2014   Procedure: BREAST LUMPECTOMY WITH RADIOACTIVE SEED LOCALIZATION;  Surgeon: Autumn Messing III, MD;  Location: [facility];  Service: General;  Laterality: Left;   COLONOSCOPY     LEFT HEART CATH AND CORONARY ANGIOGRAPHY N/A 11/15/2016   Procedure: LEFT HEART CATH AND CORONARY ANGIOGRAPHY;  Surgeon: Belva Crome, MD;  Location: [facility];  Service: Cardiovascular;  Laterality: N/A;   POLYPECTOMY     UPPER GASTROINTESTINAL ENDOSCOPY     WISDOM TOOTH EXTRACTION      FAMILY HISTORY: Family History  Problem Relation Age of Onset   Breast cancer Other    Cancer Paternal Grandmother        breast   Lung cancer Father    Breast cancer Maternal Grandmother    Healthy Mother    Coronary artery disease Neg Hx    Colon cancer Neg Hx    Pancreatic cancer Neg Hx    Rectal cancer Neg Hx    Stomach cancer Neg Hx    Esophageal cancer Neg Hx    [name] died from lung cancer in the setting of tobacco abuse at age 23. [name] died from "old age" at age 4. The patient had one brother, 3 sisters. The brother had" I cancer" requiring enucleation--the patient does not know whether this was a primary ocular melanoma. [name] was diagnosed with breast cancer but the patient does not know at what age. There is no other history of breast or ovarian cancer in the family   GYNECOLOGIC HISTORY:  No LMP recorded (lmp unknown). Patient has had a  hysterectomy. Menarche age 36, first live birth age 21. The patient is GX P3. She is status post hysterectomy without salpingo-oophorectomy. She did not take hormone replacement. She did use oral contraceptives remotely without any complications.   SOCIAL HISTORY:  Caitlin Wall works in accounts payable. Her husband [name] is disabled because of back problems. Daughter Caitlin Wall is a Marine scientist working in rehabilitation in [place]. Son Caitlin Wall lives in [place] where he works as a Games developer. Son Caitlin Wall lives in [place] where he works as a Emergency planning/management officer. The patient has 4 grandchildren. She is not a Ambulance person.    ADVANCED DIRECTIVES: Not in place   HEALTH MAINTENANCE: Social History   Tobacco Use   Smoking status: Former Smoker    Packs/day: 1.00    Years: 20.00    Pack years: 20.00    Types: Cigarettes    Quit date: 08/19/1989    Years since quitting: 29.4   Smokeless tobacco: Never Used  Substance Use Topics   Alcohol use: Yes    Comment: Occasional   Drug use: No    Colonoscopy: 10/2018, repeat 2027 (  Dr. Fuller Plan)  PAP: s/p hysterectomy  Bone density: 02/2018, -1.7  Allergies  Allergen Reactions   Codeine     REACTION: nausea,   Tramadol Hcl Nausea Only    Current Outpatient Medications  Medication Sig Dispense Refill   amLODipine (NORVASC) 2.5 MG tablet Take 2.5 mg by mouth daily.     aspirin EC 81 MG tablet Take 81 mg by mouth daily.     budesonide-formoterol (SYMBICORT) 160-4.5 MCG/ACT inhaler Inhale 2 puffs into the lungs 2 (two) times daily. 1 Inhaler 3   cholecalciferol (VITAMIN D3) 25 MCG (1000 UT) tablet Take 1 tablet (1,000 Units total) by mouth daily.     COMBIVENT RESPIMAT 20-100 MCG/ACT AERS respimat INHALE 1 PUFF BY MOUTH EVERY 6 HOURS 4 g 0   IBRANCE 125 MG tablet TAKE 1 TABLET BY MOUTH ONCE DAILY WITH OR WITHOUT FOOD  FOR 21 DAYS, FOLLOWED BY 7  DAYS OFF, REPEATED EVERY 28 DAYS 21 tablet 0   losartan (COZAAR) 100 MG tablet Take 1 tablet  by mouth once daily 30 tablet 0   morphine 10 MG/5ML solution Take 1.3-2.5 mLs (2.6-5 mg total) by mouth every 6 (six) hours as needed for severe pain. 30 mL 0   No current facility-administered medications for this visit.     OBJECTIVE: Middle-aged white woman in no acute distress Vitals:   01/11/19 0902  BP: 116/89  Pulse: 86  Resp: 18  Temp: 97.8 F (36.6 C)  SpO2: 98%     Body mass index is 28.86 kg/m.    ECOG FS:1 - Symptomatic but completely ambulatory  Filed Weights   01/11/19 0902  Weight: 173 lb 6.4 oz (78.7 kg)     Sclerae unicteric, EOMs intact Wearing a mask No cervical or supraclavicular adenopathy Lungs no rales or rhonchi Heart regular rate and rhythm Abd soft, nontender, positive bowel sounds MSK no focal spinal tenderness, no upper extremity lymphedema Neuro: nonfocal, well oriented, appropriate affect Breasts: The right breast is benign.  The left breast is status post lumpectomy and radiation.  There is no evidence of local recurrence.  Both axillae are benign.   LAB RESULTS:  CMP     Component Value Date/Time   NA 144 01/11/2019 0840   NA 141 04/25/2017 1213   NA 145 07/04/2016 1133   K 3.9 01/11/2019 0840   K 4.2 07/04/2016 1133   CL 105 01/11/2019 0840   CO2 26 01/11/2019 0840   CO2 28 07/04/2016 1133   GLUCOSE 113 (H) 01/11/2019 0840   GLUCOSE 101 07/04/2016 1133   BUN 13 01/11/2019 0840   BUN 18 04/25/2017 1213   BUN 16.0 07/04/2016 1133   CREATININE 1.19 (H) 01/11/2019 0840   CREATININE 1.09 (H) 07/23/2018 1339   CREATININE 0.9 07/04/2016 1133   CALCIUM 9.4 01/11/2019 0840   CALCIUM 9.9 07/04/2016 1133   PROT 6.4 (L) 01/11/2019 0840   PROT 7.3 07/04/2016 1133   ALBUMIN 3.3 (L) 01/11/2019 0840   ALBUMIN 3.8 07/04/2016 1133   AST 27 01/11/2019 0840   AST 35 07/23/2018 1339   AST 19 07/04/2016 1133   ALT 27 01/11/2019 0840   ALT 25 07/23/2018 1339   ALT 17 07/04/2016 1133   ALKPHOS 94 01/11/2019 0840   ALKPHOS 172 (H)  07/04/2016 1133   BILITOT 1.1 01/11/2019 0840   BILITOT 1.1 07/23/2018 1339   BILITOT 0.94 07/04/2016 1133   GFRNONAA 49 (L) 01/11/2019 0840   GFRNONAA 55 (L) 07/23/2018 1339   GFRAA  57 (L) 01/11/2019 0840   GFRAA >60 07/23/2018 1339    INo results found for: SPEP, UPEP  Lab Results  Component Value Date   WBC 2.3 (L) 01/11/2019   NEUTROABS 1.4 (L) 01/11/2019   HGB 11.7 (L) 01/11/2019   HCT 34.7 (L) 01/11/2019   MCV 109.1 (H) 01/11/2019   PLT 113 (L) 01/11/2019      Chemistry      Component Value Date/Time   NA 144 01/11/2019 0840   NA 141 04/25/2017 1213   NA 145 07/04/2016 1133   K 3.9 01/11/2019 0840   K 4.2 07/04/2016 1133   CL 105 01/11/2019 0840   CO2 26 01/11/2019 0840   CO2 28 07/04/2016 1133   BUN 13 01/11/2019 0840   BUN 18 04/25/2017 1213   BUN 16.0 07/04/2016 1133   CREATININE 1.19 (H) 01/11/2019 0840   CREATININE 1.09 (H) 07/23/2018 1339   CREATININE 0.9 07/04/2016 1133      Component Value Date/Time   CALCIUM 9.4 01/11/2019 0840   CALCIUM 9.9 07/04/2016 1133   ALKPHOS 94 01/11/2019 0840   ALKPHOS 172 (H) 07/04/2016 1133   AST 27 01/11/2019 0840   AST 35 07/23/2018 1339   AST 19 07/04/2016 1133   ALT 27 01/11/2019 0840   ALT 25 07/23/2018 1339   ALT 17 07/04/2016 1133   BILITOT 1.1 01/11/2019 0840   BILITOT 1.1 07/23/2018 1339   BILITOT 0.94 07/04/2016 1133     No results found for: LABCA2  No components found for: LABCA125  No results for input(s): INR in the last 168 hours.  Urinalysis    Component Value Date/Time   COLORURINE YELLOW 07/20/2018 [place] 07/20/2018 1747   LABSPEC 1.019 07/20/2018 1747   PHURINE 5.0 07/20/2018 1747   GLUCOSEU NEGATIVE 07/20/2018 1747   GLUCOSEU NEGATIVE 10/20/2017 1303   HGBUR NEGATIVE 07/20/2018 1747   BILIRUBINUR NEGATIVE 07/20/2018 1747   KETONESUR 20 (A) 07/20/2018 1747   PROTEINUR NEGATIVE 07/20/2018 1747   UROBILINOGEN 1.0 10/20/2017 1303   NITRITE NEGATIVE 07/20/2018 1747    LEUKOCYTESUR NEGATIVE 07/20/2018 1747    STUDIES: No results found.   ASSESSMENT: 63 y.o. [place] woman status post left lumpectomy 12/28/2014 for ductal carcinoma in situ, high-grade measuring 0.2 cm, estrogen and progesterone receptor positive, with close but negative margins  (1) adjuvant radiation completed 03/24/2015  (2) anastrozole started 04/19/2015   (a) bone density 03/01/2016 shows a T score of -1.2  (3) mildly abnormal hepatic function panel: negative workup for hepatitis B and C, normal alpha-1-anti-trypsin  (4) left lower extremity DVT diagnosed by Doppler ultrasonography 04/16/2016, with negative CT angiogram chest  (a) on Xarelto starting 04/16/2016   (b) negative hypercoagulable panel, with a minimally abnormal (indeterminate) anti-cardiolipin IgM  ((c) stopped rivaroxaban 07/16/2016, repeat D-Dimer WNL August 2018  METASTATIC DISEASE: December 2019: bone only (5) CT chest 02/06/2018 shows a mixed lytic and sclerotic process with fracture at the inferior manubrium but no other bone lesions.  However bone scan 02/16/2018 shows multiple bone lesions consistent with metastatic disease; no lung or pleura involvement, no adenopathy  (a) Bone marrow biopsy on 03/03/2018 shows metastatic carcinoma consistent with breast primary, estrogen and progesterone receptor strongly positive, HER-2 not amplified (1+)  (b) MRI abdomen and MRI brain at [facility] 03/17/2018 (results viewable in "care everywhere") notes known bone involvement in spine and pelvis, but no other area of metastatic disease in abdomen/pelvis, or brain.  (c) CA 27.29 not informative (  37.9 on 03/03/2018)  (6) started denosumab/Xgeva on 03/18/2018, repeated Q28 d  (7) started fulvestrant on 03/03/2018 and palbociclib 03/18/2018 at 125 mg/d 21/7  (a) bone scan 06/10/2018 serves as our new baseline  (b) chest CT scan on 11/11/2018 shows multiple sclerotic bone lesions (treatment effect) no visceral  lesions; lungs also show radiation change  (c) CT angio 11/16/2018 shows no evidence of pulmonary embolism  (8) palliative radiation completed 07/14/2018  (a) esophagitis secondary to radiation has resolved   PLAN: Suly is now just about a year out from definitive diagnosis of metastatic breast cancer.  Her disease is well controlled on her current treatment and she is tolerating her treatment well.  We discussed her fatigue and I think if she does a little bit of exercise, walking here and there 10 minutes at a time she will have a little bit more energy.  This is going to be a difficult winter for everyone with the pandemic worsening and more and more restrictions before we will get the vaccine and start moving back towards normalcy.  She is hydrating herself well and tells me that she has orange juice in the morning so we talked about exercise and stretching for her leg cramps.  She is not using morphine at present.  She is using ibuprofen only.  We discussed taking ibuprofen and Tylenol together which I think will be more effective for her  She will have a bone scan at the very end of December.  She will see me again with her January treatment.  Otherwise she will continue to receive fulvestrant and denosumab every 28 days with labs every 28 days  She knows to call for any other issue that may develop before the next visit.  Virgie Dad. Madyn Ivins, MD 01/11/19 9:46 AM Medical Oncology and Hematology [facility] [place], [place] [address] Tel. [phone]    Fax. [phone]   I, Wilburn Mylar, am acting as scribe for Dr. Virgie Dad. Darlisa Spruiell.  I, Lurline Del MD, have reviewed the above documentation for accuracy and completeness, and I agree with the above.

## 2019-01-11 ENCOUNTER — Inpatient Hospital Stay: Payer: 59

## 2019-01-11 ENCOUNTER — Inpatient Hospital Stay: Payer: 59 | Attending: Oncology

## 2019-01-11 ENCOUNTER — Other Ambulatory Visit: Payer: Self-pay

## 2019-01-11 ENCOUNTER — Inpatient Hospital Stay (HOSPITAL_BASED_OUTPATIENT_CLINIC_OR_DEPARTMENT_OTHER): Payer: 59 | Admitting: Oncology

## 2019-01-11 VITALS — BP 116/89 | HR 86 | Temp 97.8°F | Resp 18 | Ht 65.0 in | Wt 173.4 lb

## 2019-01-11 DIAGNOSIS — R918 Other nonspecific abnormal finding of lung field: Secondary | ICD-10-CM

## 2019-01-11 DIAGNOSIS — G893 Neoplasm related pain (acute) (chronic): Secondary | ICD-10-CM | POA: Insufficient documentation

## 2019-01-11 DIAGNOSIS — J4530 Mild persistent asthma, uncomplicated: Secondary | ICD-10-CM | POA: Insufficient documentation

## 2019-01-11 DIAGNOSIS — H9193 Unspecified hearing loss, bilateral: Secondary | ICD-10-CM

## 2019-01-11 DIAGNOSIS — Z79899 Other long term (current) drug therapy: Secondary | ICD-10-CM | POA: Diagnosis not present

## 2019-01-11 DIAGNOSIS — Z87891 Personal history of nicotine dependence: Secondary | ICD-10-CM

## 2019-01-11 DIAGNOSIS — R252 Cramp and spasm: Secondary | ICD-10-CM | POA: Insufficient documentation

## 2019-01-11 DIAGNOSIS — C50512 Malignant neoplasm of lower-outer quadrant of left female breast: Secondary | ICD-10-CM

## 2019-01-11 DIAGNOSIS — Z5111 Encounter for antineoplastic chemotherapy: Secondary | ICD-10-CM

## 2019-01-11 DIAGNOSIS — Z923 Personal history of irradiation: Secondary | ICD-10-CM | POA: Insufficient documentation

## 2019-01-11 DIAGNOSIS — B9689 Other specified bacterial agents as the cause of diseases classified elsewhere: Secondary | ICD-10-CM

## 2019-01-11 DIAGNOSIS — I824Z2 Acute embolism and thrombosis of unspecified deep veins of left distal lower extremity: Secondary | ICD-10-CM

## 2019-01-11 DIAGNOSIS — J302 Other seasonal allergic rhinitis: Secondary | ICD-10-CM

## 2019-01-11 DIAGNOSIS — Z86718 Personal history of other venous thrombosis and embolism: Secondary | ICD-10-CM | POA: Diagnosis not present

## 2019-01-11 DIAGNOSIS — Z803 Family history of malignant neoplasm of breast: Secondary | ICD-10-CM

## 2019-01-11 DIAGNOSIS — Z8719 Personal history of other diseases of the digestive system: Secondary | ICD-10-CM

## 2019-01-11 DIAGNOSIS — C7951 Secondary malignant neoplasm of bone: Secondary | ICD-10-CM

## 2019-01-11 DIAGNOSIS — E785 Hyperlipidemia, unspecified: Secondary | ICD-10-CM | POA: Diagnosis not present

## 2019-01-11 DIAGNOSIS — K5900 Constipation, unspecified: Secondary | ICD-10-CM | POA: Insufficient documentation

## 2019-01-11 DIAGNOSIS — K219 Gastro-esophageal reflux disease without esophagitis: Secondary | ICD-10-CM

## 2019-01-11 DIAGNOSIS — Z170 Estrogen receptor positive status [ER+]: Secondary | ICD-10-CM | POA: Diagnosis not present

## 2019-01-11 DIAGNOSIS — I10 Essential (primary) hypertension: Secondary | ICD-10-CM

## 2019-01-11 DIAGNOSIS — Z7901 Long term (current) use of anticoagulants: Secondary | ICD-10-CM | POA: Diagnosis not present

## 2019-01-11 DIAGNOSIS — Z801 Family history of malignant neoplasm of trachea, bronchus and lung: Secondary | ICD-10-CM | POA: Insufficient documentation

## 2019-01-11 DIAGNOSIS — Z90710 Acquired absence of both cervix and uterus: Secondary | ICD-10-CM

## 2019-01-11 DIAGNOSIS — Z885 Allergy status to narcotic agent status: Secondary | ICD-10-CM

## 2019-01-11 DIAGNOSIS — J3089 Other allergic rhinitis: Secondary | ICD-10-CM

## 2019-01-11 LAB — COMPREHENSIVE METABOLIC PANEL
ALT: 27 U/L (ref 0–44)
AST: 27 U/L (ref 15–41)
Albumin: 3.3 g/dL — ABNORMAL LOW (ref 3.5–5.0)
Alkaline Phosphatase: 94 U/L (ref 38–126)
Anion gap: 13 (ref 5–15)
BUN: 13 mg/dL (ref 8–23)
CO2: 26 mmol/L (ref 22–32)
Calcium: 9.4 mg/dL (ref 8.9–10.3)
Chloride: 105 mmol/L (ref 98–111)
Creatinine, Ser: 1.19 mg/dL — ABNORMAL HIGH (ref 0.44–1.00)
GFR calc Af Amer: 57 mL/min — ABNORMAL LOW (ref >60)
GFR calc non Af Amer: 49 mL/min — ABNORMAL LOW (ref >60)
Glucose, Bld: 113 mg/dL — ABNORMAL HIGH (ref 70–99)
Potassium: 3.9 mmol/L (ref 3.5–5.1)
Sodium: 144 mmol/L (ref 135–145)
Total Bilirubin: 1.1 mg/dL (ref 0.3–1.2)
Total Protein: 6.4 g/dL — ABNORMAL LOW (ref 6.5–8.1)

## 2019-01-11 LAB — CBC WITH DIFFERENTIAL/PLATELET
Abs Immature Granulocytes: 0.01 10*3/uL (ref 0.00–0.07)
Basophils Absolute: 0.1 10*3/uL (ref 0.0–0.1)
Basophils Relative: 2 %
Eosinophils Absolute: 0 10*3/uL (ref 0.0–0.5)
Eosinophils Relative: 1 %
HCT: 34.7 % — ABNORMAL LOW (ref 36.0–46.0)
Hemoglobin: 11.7 g/dL — ABNORMAL LOW (ref 12.0–15.0)
Immature Granulocytes: 0 %
Lymphocytes Relative: 27 %
Lymphs Abs: 0.6 10*3/uL — ABNORMAL LOW (ref 0.7–4.0)
MCH: 36.8 pg — ABNORMAL HIGH (ref 26.0–34.0)
MCHC: 33.7 g/dL (ref 30.0–36.0)
MCV: 109.1 fL — ABNORMAL HIGH (ref 80.0–100.0)
Monocytes Absolute: 0.2 10*3/uL (ref 0.1–1.0)
Monocytes Relative: 10 %
Neutro Abs: 1.4 10*3/uL — ABNORMAL LOW (ref 1.7–7.7)
Neutrophils Relative %: 60 %
Platelets: 113 10*3/uL — ABNORMAL LOW (ref 150–400)
RBC: 3.18 MIL/uL — ABNORMAL LOW (ref 3.87–5.11)
RDW: 14.6 % (ref 11.5–15.5)
WBC: 2.3 10*3/uL — ABNORMAL LOW (ref 4.0–10.5)
nRBC: 0 % (ref 0.0–0.2)

## 2019-01-11 MED ORDER — ACETAMINOPHEN 500 MG PO TABS: 500.0000 mg | ORAL_TABLET | Freq: Three times a day (TID) | ORAL | 0 refills | Status: DC | PRN

## 2019-01-11 MED ORDER — DENOSUMAB 120 MG/1.7ML ~~LOC~~ SOLN
SUBCUTANEOUS | Status: AC
  Filled 2019-01-11: qty 1.7

## 2019-01-11 MED ORDER — FULVESTRANT 250 MG/5ML IM SOLN
500.0000 mg | Freq: Once | INTRAMUSCULAR | Status: AC
  Administered 2019-01-11: 500 mg via INTRAMUSCULAR

## 2019-01-11 MED ORDER — FULVESTRANT 250 MG/5ML IM SOLN
INTRAMUSCULAR | Status: AC
  Filled 2019-01-11: qty 5

## 2019-01-11 MED ORDER — DENOSUMAB 120 MG/1.7ML ~~LOC~~ SOLN
120.0000 mg | Freq: Once | SUBCUTANEOUS | Status: AC
  Administered 2019-01-11: 120 mg via SUBCUTANEOUS

## 2019-01-11 MED ORDER — COMBIVENT RESPIMAT 20-100 MCG/ACT IN AERS: 1.0000 | INHALATION_SPRAY | Freq: Four times a day (QID) | RESPIRATORY_TRACT | 0 refills | Status: DC | PRN

## 2019-01-11 NOTE — Patient Instructions (Signed)
Fulvestrant injection What is this medicine? FULVESTRANT (ful VES trant) blocks the effects of estrogen. It is used to treat breast cancer. This medicine may be used for other purposes; ask your health care provider or pharmacist if you have questions. COMMON BRAND NAME(S): FASLODEX What should I tell my health care provider before I take this medicine? They need to know if you have any of these conditions:  bleeding disorders  liver disease  low blood counts, like low white cell, platelet, or red cell counts  an unusual or allergic reaction to fulvestrant, other medicines, foods, dyes, or preservatives  pregnant or trying to get pregnant  breast-feeding How should I use this medicine? This medicine is for injection into a muscle. It is usually given by a health care professional in a hospital or clinic setting. Talk to your pediatrician regarding the use of this medicine in children. Special care may be needed. Overdosage: If you think you have taken too much of this medicine contact a poison control center or emergency room at once. NOTE: This medicine is only for you. Do not share this medicine with others. What if I miss a dose? It is important not to miss your dose. Call your doctor or health care professional if you are unable to keep an appointment. What may interact with this medicine?  medicines that treat or prevent blood clots like warfarin, enoxaparin, dalteparin, apixaban, dabigatran, and rivaroxaban This list may not describe all possible interactions. Give your health care provider a list of all the medicines, herbs, non-prescription drugs, or dietary supplements you use. Also tell them if you smoke, drink alcohol, or use illegal drugs. Some items may interact with your medicine. What should I watch for while using this medicine? Your condition will be monitored carefully while you are receiving this medicine. You will need important blood work done while you are taking  this medicine. Do not become pregnant while taking this medicine or for at least 1 year after stopping it. Women of child-bearing potential will need to have a negative pregnancy test before starting this medicine. Women should inform their doctor if they wish to become pregnant or think they might be pregnant. There is a potential for serious side effects to an unborn child. Men should inform their doctors if they wish to father a child. This medicine may lower sperm counts. Talk to your health care professional or pharmacist for more information. Do not breast-feed an infant while taking this medicine or for 1 year after the last dose. What side effects may I notice from receiving this medicine? Side effects that you should report to your doctor or health care professional as soon as possible:  allergic reactions like skin rash, itching or hives, swelling of the face, lips, or tongue  feeling faint or lightheaded, falls  pain, tingling, numbness, or weakness in the legs  signs and symptoms of infection like fever or chills; cough; flu-like symptoms; sore throat  vaginal bleeding Side effects that usually do not require medical attention (report to your doctor or health care professional if they continue or are bothersome):  aches, pains  constipation  diarrhea  headache  hot flashes  nausea, vomiting  pain at site where injected  stomach pain This list may not describe all possible side effects. Call your doctor for medical advice about side effects. You may report side effects to FDA at 1-800-FDA-1088. Where should I keep my medicine? This drug is given in a hospital or clinic and will  not be stored at home. NOTE: This sheet is a summary. It may not cover all possible information. If you have questions about this medicine, talk to your doctor, pharmacist, or health care provider.  2020 [employer] Standard (2017-05-22 11:34:41) Denosumab injection What is this  medicine? DENOSUMAB (den oh sue mab) slows bone breakdown. Prolia is used to treat osteoporosis in women after menopause and in men, and in people who are taking corticosteroids for 6 months or more. Delton See is used to treat a high calcium level due to cancer and to prevent bone fractures and other bone problems caused by multiple myeloma or cancer bone metastases. Delton See is also used to treat giant cell tumor of the bone. This medicine may be used for other purposes; ask your health care provider or pharmacist if you have questions. COMMON BRAND NAME(S): Prolia, XGEVA What should I tell my health care provider before I take this medicine? They need to know if you have any of these conditions:  dental disease  having surgery or tooth extraction  infection  kidney disease  low levels of calcium or Vitamin D in the blood  malnutrition  on hemodialysis  skin conditions or sensitivity  thyroid or parathyroid disease  an unusual reaction to denosumab, other medicines, foods, dyes, or preservatives  pregnant or trying to get pregnant  breast-feeding How should I use this medicine? This medicine is for injection under the skin. It is given by a health care professional in a hospital or clinic setting. A special MedGuide will be given to you before each treatment. Be sure to read this information carefully each time. For Prolia, talk to your pediatrician regarding the use of this medicine in children. Special care may be needed. For Delton See, talk to your pediatrician regarding the use of this medicine in children. While this drug may be prescribed for children as young as 13 years for selected conditions, precautions do apply. Overdosage: If you think you have taken too much of this medicine contact a poison control center or emergency room at once. NOTE: This medicine is only for you. Do not share this medicine with others. What if I miss a dose? It is important not to miss your dose. Call  your doctor or health care professional if you are unable to keep an appointment. What may interact with this medicine? Do not take this medicine with any of the following medications:  other medicines containing denosumab This medicine may also interact with the following medications:  medicines that lower your chance of fighting infection  steroid medicines like prednisone or cortisone This list may not describe all possible interactions. Give your health care provider a list of all the medicines, herbs, non-prescription drugs, or dietary supplements you use. Also tell them if you smoke, drink alcohol, or use illegal drugs. Some items may interact with your medicine. What should I watch for while using this medicine? Visit your doctor or health care professional for regular checks on your progress. Your doctor or health care professional may order blood tests and other tests to see how you are doing. Call your doctor or health care professional for advice if you get a fever, chills or sore throat, or other symptoms of a cold or flu. Do not treat yourself. This drug may decrease your body's ability to fight infection. Try to avoid being around people who are sick. You should make sure you get enough calcium and vitamin D while you are taking this medicine, unless your doctor tells  you not to. Discuss the foods you eat and the vitamins you take with your health care professional. See your dentist regularly. Brush and floss your teeth as directed. Before you have any dental work done, tell your dentist you are receiving this medicine. Do not become pregnant while taking this medicine or for 5 months after stopping it. Talk with your doctor or health care professional about your birth control options while taking this medicine. Women should inform their doctor if they wish to become pregnant or think they might be pregnant. There is a potential for serious side effects to an unborn child. Talk to your  health care professional or pharmacist for more information. What side effects may I notice from receiving this medicine? Side effects that you should report to your doctor or health care professional as soon as possible:  allergic reactions like skin rash, itching or hives, swelling of the face, lips, or tongue  bone pain  breathing problems  dizziness  jaw pain, especially after dental work  redness, blistering, peeling of the skin  signs and symptoms of infection like fever or chills; cough; sore throat; pain or trouble passing urine  signs of low calcium like fast heartbeat, muscle cramps or muscle pain; pain, tingling, numbness in the hands or feet; seizures  unusual bleeding or bruising  unusually weak or tired Side effects that usually do not require medical attention (report to your doctor or health care professional if they continue or are bothersome):  constipation  diarrhea  headache  joint pain  loss of appetite  muscle pain  runny nose  tiredness  upset stomach This list may not describe all possible side effects. Call your doctor for medical advice about side effects. You may report side effects to FDA at 1-800-FDA-1088. Where should I keep my medicine? This medicine is only given in a clinic, doctor's office, or other health care setting and will not be stored at home. NOTE: This sheet is a summary. It may not cover all possible information. If you have questions about this medicine, talk to your doctor, pharmacist, or health care provider.  2020 [employer] Standard (2017-06-20 16:10:44)  

## 2019-01-12 ENCOUNTER — Ambulatory Visit (INDEPENDENT_AMBULATORY_CARE_PROVIDER_SITE_OTHER): Payer: 59 | Admitting: Internal Medicine

## 2019-01-12 ENCOUNTER — Encounter: Payer: Self-pay | Admitting: Internal Medicine

## 2019-01-12 DIAGNOSIS — J4520 Mild intermittent asthma, uncomplicated: Secondary | ICD-10-CM

## 2019-01-12 NOTE — Progress Notes (Signed)
Caitlin Wall, female    DOB: [birth year],    MRN: [id number]   Brief patient profile:  63 yowf quit smoking in 1991 with no resp when stopped smoking and with nl spirometry in 2014 and followed by Dr Annamaria Boots with dx of asthma and maint on nothing and did fine until Sept 2020 with worse cough/sob rx combivent twice daily but then given symbicort/pred x 12 days starting 12/01/18 by Dr Jenny Reichmann and continued the combivent as well and referred to pulmonary clinic 12/15/2018 by Dr Jenny Reichmann.      History of Present Illness  12/15/2018  Pulmonary/ 1st office eval/Wert maint on symb 160 poor hfa/ finished pred 10/18 and no worse off  Chief Complaint  Patient presents with  . Pulmonary Consult    Referred by Dr. [name].  Pt c/o SOB for the past month. She states she gets SOB walking short distances such as room to room. She has noticed some improvement with recent prednisone taper. She uses combivent 2 x daily on average.   Dyspnea:  MMRC3 = can't walk 100 yards even at a slow pace at a flat grade s stopping due to sob uses [employer] parking  (was room to room prior to prednisoe)  Cough: minimal now  Sleep: on side / flat bed one pillow  SABA use: bid (not prn) rec Plan A = Automatic = Always=   Symbicort 160 Take 2 puffs first thing in am and then another 2 puffs about 12 hours later.  Work on inhaler technique:   Plan B = Backup (to supplement plan A, not to replace it) Only use your combivent  inhaler as a rescue medication     01/12/2019  f/u ov/Wert re: asthma / very steroid responsive Chief Complaint  Patient presents with  . Follow-up    Breathing is doing well and she has not had to use her combivent.    Dyspnea:  yardwork ok / steps can be problem  Cough: none  Sleeping: no resp symptoms  SABA use: none  02: none    No obvious day to day or daytime variability or assoc excess/ purulent sputum or mucus plugs or hemoptysis or cp or chest tightness, subjective wheeze or overt sinus or  hb symptoms.   Sleeping now  without nocturnal  or early am exacerbation  of respiratory  c/o's or need for noct saba. Also denies any obvious fluctuation of symptoms with weather or environmental changes or other aggravating or alleviating factors except as outlined above   No unusual exposure hx or h/o childhood pna/ asthma or knowledge of premature birth.  Current Allergies, Complete Past Medical History, Past Surgical History, Family History, and Social History were reviewed in [employer] record.  ROS  The following are not active complaints unless bolded Hoarseness, sore throat, dysphagia, dental problems, itching, sneezing,  nasal congestion or discharge of excess mucus or purulent secretions, ear ache,   fever, chills, sweats, unintended wt loss or wt gain, classically pleuritic or exertional cp,  orthopnea pnd or arm/hand swelling  or leg swelling, presyncope, palpitations, abdominal pain, anorexia, nausea, vomiting, diarrhea  or change in bowel habits or change in bladder habits, change in stools or change in urine, dysuria, hematuria,  rash, arthralgias, visual complaints, headache, numbness, weakness or ataxia or problems with walking or coordination,  change in mood or  memory.        Current Meds  Medication Sig  . acetaminophen (TYLENOL) 500 MG tablet  Take 1 tablet (500 mg total) by mouth every 8 (eight) hours as needed. Take together with ibuprofen 200 mg tablet  . amLODipine (NORVASC) 2.5 MG tablet Take 2.5 mg by mouth daily.  Marland Kitchen aspirin EC 81 MG tablet Take 81 mg by mouth daily.  . budesonide-formoterol (SYMBICORT) 160-4.5 MCG/ACT inhaler Inhale 2 puffs into the lungs 2 (two) times daily.  . cholecalciferol (VITAMIN D3) 25 MCG (1000 UT) tablet Take 1 tablet (1,000 Units total) by mouth daily.  Leslee Home 125 MG tablet TAKE 1 TABLET BY MOUTH ONCE DAILY WITH OR WITHOUT FOOD  FOR 21 DAYS, FOLLOWED BY 7  DAYS OFF, REPEATED EVERY 28 DAYS  . Ipratropium-Albuterol  (COMBIVENT RESPIMAT) 20-100 MCG/ACT AERS respimat Inhale 1 puff into the lungs every 6 (six) hours as needed for wheezing.  Marland Kitchen losartan (COZAAR) 100 MG tablet Take 1 tablet by mouth once daily  . morphine 10 MG/5ML solution Take 1.3-2.5 mLs (2.6-5 mg total) by mouth every 6 (six) hours as needed for severe pain.                Past Medical History:  Diagnosis Date  . Asthma, persistent not controlled 07/27/2012  . Breast cancer ([place]) 12/28/14   Left BresastDCIS  . COLONIC POLYPS, HX OF 11/04/2007  . COPD (chronic obstructive pulmonary disease) ([facility])   . GERD 11/04/2007  . GERD (gastroesophageal reflux disease)   . HAIR LOSS 11/04/2007  . Hearing loss    bilateral - wears hearing aids  . Hx of cardiac cath 11/15/2016   a. LHC 11/15/16 showed normal coronaries and EF 50-55% with normal EDP. (done for false positive NST)  . Hx of colonic polyps 1995   adenomatous  . Hyperlipidemia   . HYPERLIPIDEMIA 11/04/2007  . Hypertension   . HYPERTENSION 06/07/2009  . LBBB (left bundle branch block) 07/26/2011   no current problems  . Personal history of radiation therapy   . S/P radiation therapy 02/02/15-03/24/15   left breast 60.4GY      Objective:    amb pleasant wf nad    Wt Readings from Last 3 Encounters:  01/12/19 174 lb (78.9 kg)  01/11/19 173 lb 6.4 oz (78.7 kg)  12/30/18 173 lb (78.5 kg)     Vital signs reviewed - Note on arrival 02 sats  98% on RA      HEENT : pt wearing mask not removed for exam due to covid -19 concerns.    NECK :  without JVD/Nodes/TM/ nl carotid upstrokes bilaterally   LUNGS: no acc muscle use,  Nl contour chest which is clear to A and P bilaterally without cough on insp or exp maneuvers   CV:  RRR  no s3 or murmur or increase in P2, and no edema   ABD:  soft and nontender with nl inspiratory excursion in the supine position. No bruits or organomegaly appreciated, bowel sounds nl  MS:  Nl gait/ ext warm without deformities, calf tenderness, cyanosis  or clubbing No obvious joint restrictions   SKIN: warm and dry without lesions    NEURO:  alert, approp, nl sensorium with  no motor or cerebellar deficits apparent.           Assessment

## 2019-01-12 NOTE — Patient Instructions (Signed)
Work on inhaler technique:  relax and gently blow all the way out then take a nice smooth deep breath back in, triggering the inhaler at same time you start breathing in.  Hold for up to 5 seconds if you can. Blow out thru nose. Rinse and gargle with water when done     Please schedule a follow up visit in 6  months but call sooner if needed  

## 2019-01-12 NOTE — Assessment & Plan Note (Signed)
Smoked one pack per day x 20 years, ending in 1991. PFT 08/11/12- very mild obstructive airways disease in small airways, with response to bronchodilator. Normal lung volumes and diffusion. FVC 3.59/104%, FEV1 2.70/100%, FEV1/FVC 0.75, FEF 25-75% 2.35/92% TLC 98%, DLCO 88%. - 01/12/2019  After extensive coaching inhaler device,  effectiveness =   90%   All goals of chronic asthma control met including optimal function and elimination of symptoms with minimal need for rescue therapy.  Contingencies discussed in full including contacting this office immediately if not controlling the symptoms using the rule of two's.     I had an extended discussion with the patient reviewing all relevant studies completed to date and  lasting 15 to 20 minutes of a 25 minute visit    I performed detailed device teaching using a teach back method which extended face to face time for this visit (see above)  Each maintenance medication was reviewed in detail including emphasizing most importantly the difference between maintenance and prns and under what circumstances the prns are to be triggered using an action plan format that is not reflected in the computer generated alphabetically organized AVS which I have not found useful in most complex patients, especially with respiratory illnesses  Please see AVS for specific instructions unique to this visit that I personally wrote and verbalized to the the pt in detail and then reviewed with pt  by my nurse highlighting any  changes in therapy recommended at today's visit to their plan of care.     

## 2019-01-15 ENCOUNTER — Other Ambulatory Visit: Payer: Self-pay | Admitting: Internal Medicine

## 2019-01-20 ENCOUNTER — Other Ambulatory Visit: Payer: Self-pay | Admitting: Oncology

## 2019-01-20 NOTE — Progress Notes (Signed)
Caitlin Wall saw Dr. 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  He was ready to remove it when [name] told him that she expected to die anytime soon from her incurable cancer.  I discussed [name] prognosis with Dr. Lannette Donath and I expect her to live several years.  I encouraged him to go ahead and treat her the way he would treat anyone else.  Obviously again still feels very very vulnerable and we will have to readdress this next time she sees me.

## 2019-01-21 ENCOUNTER — Other Ambulatory Visit: Payer: Self-pay | Admitting: Oncology

## 2019-01-25 ENCOUNTER — Other Ambulatory Visit: Payer: Self-pay | Admitting: Obstetrics and Gynecology

## 2019-01-25 ENCOUNTER — Telehealth: Payer: Self-pay | Admitting: Internal Medicine

## 2019-01-25 NOTE — Telephone Encounter (Signed)
Dr. Wert please advise 

## 2019-01-26 NOTE — H&P (Signed)
NAME: Caitlin Wall, Caitlin Wall MEDICAL RECORD [id number] ACCOUNT [id number] DATE OF BIRTH:May 01, 1955 FACILITY: [facility] LOCATION:  Encarnacion Slates, MD  HISTORY AND PHYSICAL  DATE OF ADMISSION:  02/02/2019  DATE OF ADMISSION:  02/02/2019.  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  I saw this patient on 11/18/2018 after 2-1/2 years without being seen.  At that time, she  reported that she had been diagnosed with metastatic breast cancer and was terminal.  She stated there are metastases involve her skull, spine, hips and ribs as well as her shoulder blades.  On exam, she was noted at 4 o'clock from the introitus to have  thickened tissue that was slightly white.  This was not very impressive, but she was asked to return in 2 months for reevaluation.  When she returned, the lesion was still present and distal to the whitened area was a darkly pigmented area that came  close to the anus, but did not involve the anus.  I did biopsies of both lesions and both returned as VIN 3.  The one that was slightly white and then closer to the introitus was VIN 2-3, the other one was VIN 3 noted.  No invasive process was  identified.  The patient was informed and advised to have a local wide excision of the abnormality.  ALLERGIES:  CODEINE CAUSES NAUSEA.  SHE IS ALSO ALLERGIC TO TRAMADOL.  MEDICATIONS: symbicort 1.65 inhalation bid,  protonix 40 mg qd, Ibrance 125 mg qd, losartan 100 mg qd,  vitamin D3  Capsules qd.  PAST MEDICAL HISTORY:  Revealed malignant tumor of the breast that was now metastatic to bones and chronic obstructive lung disease.  FAMILY HISTORY:  Mother with high blood pressure and paternal grandmother with cancer of the breast.  SOCIAL HISTORY:  The patient had a history of smoking, but she quit in 1991.  Drinks occasionally.  Denies illicit drugs.  Has 12 years  of education.  She is married and sexually active.  PAST SURGICAL HISTORY:  Cervical disk surgery, hysterectomy in 2002, breast implants in 2000, cryotherapy of the cervix in 1986, tubal ligation in 1982, ear surgery in 1972.  She had 3 vaginal deliveries.  She also has a history of high blood pressure,  COPD and urinary incontinence.  PHYSICAL EXAMINATION: GENERAL:  This is a well-developed, overweight white female, 5 feet 5 inches, 176 pounds. VITAL SIGNS:  Blood pressure 130/80, pulse of 80. HEENT:  No cranial abnormalities.  Extraocular movements intact.  Nose and pharynx clear. NECK:  Supple, without thyromegaly. BREASTS:  Soft with bilateral implants.  No masses are palpable. HEART:  Normal size and sounds, no murmurs. LUNGS:  Clear to auscultation. ABDOMEN:  Soft, nontender, no masses are palpable.  Liver, spleen and kidneys not felt.   GENITAL:  Vulva and vagina show a clitoral piercing.  Labia majora and minora are normal.  The introitus is normal.  Bartholin glands are normal.  The vagina has moderate atrophy.  Cuff is intact.  Cervix is absent.  Uterus absent.  Adnexa are clear.  At  4 o'clock from the introitus is thickened tissue that is slightly white and distal to that, is slightly darkly pigmented thickened tissue.  Biopsies proved VIN 3 The biopsy sites are still apparent but no sign of infection.   ASSESSMENT AND PLAN:  The patient is admitted for wide excision  of this area,  try to get as much tissue as we can and surrounding the area up to 1 cm, but this might be difficult to do close to the anus.  The patient also has a history of high blood  pressure, metastatic breast cancer and chronic obstructive pulmonary disease.  The risks of surgery have been explained to the patient and she is ready to proceed.  VN/NUANCE  D:01/25/2019 T:01/25/2019 JOB:[id number]

## 2019-01-26 NOTE — Telephone Encounter (Signed)
Nothing needed pre-op - ok for surgery/gen anesthesia unless asthma flare in meantime

## 2019-01-26 NOTE — Telephone Encounter (Signed)
I called and spoke with the patient and made her aware. Nothing further is needed.

## 2019-01-27 ENCOUNTER — Telehealth: Payer: Self-pay | Admitting: *Deleted

## 2019-01-27 ENCOUNTER — Encounter (HOSPITAL_BASED_OUTPATIENT_CLINIC_OR_DEPARTMENT_OTHER): Payer: Self-pay | Admitting: *Deleted

## 2019-01-27 NOTE — Telephone Encounter (Signed)
Records faxed to [facility] - att Melody Money- release [phone]

## 2019-01-28 ENCOUNTER — Other Ambulatory Visit: Payer: Self-pay

## 2019-01-28 ENCOUNTER — Encounter (HOSPITAL_BASED_OUTPATIENT_CLINIC_OR_DEPARTMENT_OTHER): Payer: Self-pay | Admitting: *Deleted

## 2019-01-28 NOTE — Progress Notes (Signed)
Spoke w/ via phone for pre-op interview---Anaaya Lab needs dos----  Cbc, cmet            Lab results------ COVID test ------01-29-2019 Arrive at -------815 NPO after ------midnight Medications to take morning of surgery -----symbicort, amlodipine, protonix, ibrance (ok per anesthesia jessica zanetto pa, patient to check with dr Fredric Dine if ok to take ibrance for surgery) Pulmonary clearance dr wert 01-25-19 chart/epic, lov dr wert 01-12-19 chart/epic10-6-20 chart/epic, chest ct 11-16-18 chart/epic Diabetic medication -----n/a Patient Special Instructions ----- Pre-Op special Istructions ----- Patient verbalized understanding of instructions that were given at this phone interview. Patient denies shortness of breath, chest pain, fever, cough a this phone interview.

## 2019-01-29 ENCOUNTER — Other Ambulatory Visit (HOSPITAL_COMMUNITY)
Admission: RE | Admit: 2019-01-29 | Discharge: 2019-01-29 | Disposition: A | Discharge status: Home / Self Care | Payer: 59 | Source: Ambulatory Visit | Attending: Obstetrics and Gynecology | Admitting: Obstetrics and Gynecology

## 2019-01-29 DIAGNOSIS — Z01812 Encounter for preprocedural laboratory examination: Secondary | ICD-10-CM | POA: Diagnosis present

## 2019-01-29 DIAGNOSIS — Z20828 Contact with and (suspected) exposure to other viral communicable diseases: Secondary | ICD-10-CM | POA: Diagnosis not present

## 2019-01-30 LAB — NOVEL CORONAVIRUS, NAA (HOSP ORDER, SEND-OUT TO REF LAB; TAT 18-24 HRS): SARS-CoV-2, NAA: NOT DETECTED

## 2019-02-01 NOTE — Progress Notes (Signed)
SPOKE [place] ON 01-29-2019 AND TODAY, DOES DR HENLEY WANT CBC OR CBC WITH DIF FOR PRE OP LANS FOR 02-02-2019 SURGERY? HANNAH TO SEND DR HENLEY MESSAGE AGAIN.

## 2019-02-01 NOTE — Progress Notes (Signed)
I spoke with Jarrett Soho, surgery scheduler for Dr. Ulanda Edison due to abbreviations in consent order.  She will let Dr. Ulanda Edison know so he can spell out the abbreviations in the order.

## 2019-02-02 ENCOUNTER — Encounter (HOSPITAL_BASED_OUTPATIENT_CLINIC_OR_DEPARTMENT_OTHER): Payer: Self-pay

## 2019-02-02 ENCOUNTER — Ambulatory Visit (HOSPITAL_BASED_OUTPATIENT_CLINIC_OR_DEPARTMENT_OTHER): Payer: 59 | Admitting: Anesthesiology

## 2019-02-02 ENCOUNTER — Ambulatory Visit (HOSPITAL_BASED_OUTPATIENT_CLINIC_OR_DEPARTMENT_OTHER)
Admission: RE | Admit: 2019-02-02 | Discharge: 2019-02-02 | Disposition: A | Discharge status: Home / Self Care | Payer: 59 | Attending: Obstetrics and Gynecology | Admitting: Obstetrics and Gynecology

## 2019-02-02 ENCOUNTER — Other Ambulatory Visit: Payer: Self-pay

## 2019-02-02 ENCOUNTER — Encounter (HOSPITAL_BASED_OUTPATIENT_CLINIC_OR_DEPARTMENT_OTHER)
Admission: RE | Disposition: A | Discharge status: Home / Self Care | Payer: Self-pay | Source: Home / Self Care | Attending: Obstetrics and Gynecology

## 2019-02-02 DIAGNOSIS — Z86718 Personal history of other venous thrombosis and embolism: Secondary | ICD-10-CM | POA: Diagnosis not present

## 2019-02-02 DIAGNOSIS — D071 Carcinoma in situ of vulva: Secondary | ICD-10-CM | POA: Insufficient documentation

## 2019-02-02 DIAGNOSIS — Z803 Family history of malignant neoplasm of breast: Secondary | ICD-10-CM | POA: Diagnosis not present

## 2019-02-02 DIAGNOSIS — K219 Gastro-esophageal reflux disease without esophagitis: Secondary | ICD-10-CM | POA: Insufficient documentation

## 2019-02-02 DIAGNOSIS — Z90710 Acquired absence of both cervix and uterus: Secondary | ICD-10-CM | POA: Diagnosis not present

## 2019-02-02 DIAGNOSIS — Z79899 Other long term (current) drug therapy: Secondary | ICD-10-CM | POA: Diagnosis not present

## 2019-02-02 DIAGNOSIS — C50919 Malignant neoplasm of unspecified site of unspecified female breast: Secondary | ICD-10-CM | POA: Insufficient documentation

## 2019-02-02 DIAGNOSIS — Z7951 Long term (current) use of inhaled steroids: Secondary | ICD-10-CM | POA: Insufficient documentation

## 2019-02-02 DIAGNOSIS — I10 Essential (primary) hypertension: Secondary | ICD-10-CM | POA: Diagnosis not present

## 2019-02-02 DIAGNOSIS — Z87891 Personal history of nicotine dependence: Secondary | ICD-10-CM | POA: Insufficient documentation

## 2019-02-02 DIAGNOSIS — J449 Chronic obstructive pulmonary disease, unspecified: Secondary | ICD-10-CM | POA: Diagnosis not present

## 2019-02-02 HISTORY — PX: VULVECTOMY PARTIAL: SHX6187

## 2019-02-02 LAB — CBC WITH DIFFERENTIAL/PLATELET
Abs Immature Granulocytes: 0.03 10*3/uL (ref 0.00–0.07)
Basophils Absolute: 0.1 10*3/uL (ref 0.0–0.1)
Basophils Relative: 1 %
Eosinophils Absolute: 0.3 10*3/uL (ref 0.0–0.5)
Eosinophils Relative: 4 %
HCT: 35.8 % — ABNORMAL LOW (ref 36.0–46.0)
Hemoglobin: 12 g/dL (ref 12.0–15.0)
Immature Granulocytes: 1 %
Lymphocytes Relative: 13 %
Lymphs Abs: 0.8 10*3/uL (ref 0.7–4.0)
MCH: 37 pg — ABNORMAL HIGH (ref 26.0–34.0)
MCHC: 33.5 g/dL (ref 30.0–36.0)
MCV: 110.5 fL — ABNORMAL HIGH (ref 80.0–100.0)
Monocytes Absolute: 0.5 10*3/uL (ref 0.1–1.0)
Monocytes Relative: 8 %
Neutro Abs: 4.7 10*3/uL (ref 1.7–7.7)
Neutrophils Relative %: 73 %
Platelets: 288 10*3/uL (ref 150–400)
RBC: 3.24 MIL/uL — ABNORMAL LOW (ref 3.87–5.11)
RDW: 14 % (ref 11.5–15.5)
WBC: 6.4 10*3/uL (ref 4.0–10.5)
nRBC: 0 % (ref 0.0–0.2)

## 2019-02-02 LAB — COMPREHENSIVE METABOLIC PANEL
ALT: 21 U/L (ref 0–44)
AST: 29 U/L (ref 15–41)
Albumin: 3.4 g/dL — ABNORMAL LOW (ref 3.5–5.0)
Alkaline Phosphatase: 87 U/L (ref 38–126)
Anion gap: 9 (ref 5–15)
BUN: 22 mg/dL (ref 8–23)
CO2: 29 mmol/L (ref 22–32)
Calcium: 9.6 mg/dL (ref 8.9–10.3)
Chloride: 106 mmol/L (ref 98–111)
Creatinine, Ser: 1.46 mg/dL — ABNORMAL HIGH (ref 0.44–1.00)
GFR calc Af Amer: 45 mL/min — ABNORMAL LOW (ref >60)
GFR calc non Af Amer: 38 mL/min — ABNORMAL LOW (ref >60)
Glucose, Bld: 99 mg/dL (ref 70–99)
Potassium: 4.3 mmol/L (ref 3.5–5.1)
Sodium: 144 mmol/L (ref 135–145)
Total Bilirubin: 1 mg/dL (ref 0.3–1.2)
Total Protein: 6.5 g/dL (ref 6.5–8.1)

## 2019-02-02 SURGERY — VULVECTOMY, PARTIAL
Anesthesia: General

## 2019-02-02 MED ORDER — ONDANSETRON HCL 4 MG/2ML IJ SOLN
INTRAMUSCULAR | Status: AC
  Filled 2019-02-02: qty 2

## 2019-02-02 MED ORDER — PROPOFOL 10 MG/ML IV BOLUS
INTRAVENOUS | Status: AC
  Filled 2019-02-02: qty 20

## 2019-02-02 MED ORDER — PHENYLEPHRINE 40 MCG/ML (10ML) SYRINGE FOR IV PUSH (FOR BLOOD PRESSURE SUPPORT)
PREFILLED_SYRINGE | INTRAVENOUS | Status: AC
  Filled 2019-02-02: qty 10

## 2019-02-02 MED ORDER — DEXAMETHASONE SODIUM PHOSPHATE 10 MG/ML IJ SOLN
INTRAMUSCULAR | Status: AC
  Filled 2019-02-02: qty 1

## 2019-02-02 MED ORDER — ACETAMINOPHEN 500 MG PO TABS
1000.0000 mg | ORAL_TABLET | Freq: Once | ORAL | Status: AC
  Administered 2019-02-02: 1000 mg via ORAL
  Filled 2019-02-02: qty 2

## 2019-02-02 MED ORDER — MIDAZOLAM HCL 2 MG/2ML IJ SOLN
INTRAMUSCULAR | Status: AC
  Filled 2019-02-02: qty 2

## 2019-02-02 MED ORDER — LIDOCAINE 2% (20 MG/ML) 5 ML SYRINGE
INTRAMUSCULAR | Status: DC | PRN
  Administered 2019-02-02: 100 mg via INTRAVENOUS

## 2019-02-02 MED ORDER — FENTANYL CITRATE (PF) 100 MCG/2ML IJ SOLN
INTRAMUSCULAR | Status: AC
  Filled 2019-02-02: qty 2

## 2019-02-02 MED ORDER — EPHEDRINE 5 MG/ML INJ
INTRAVENOUS | Status: AC
  Filled 2019-02-02: qty 10

## 2019-02-02 MED ORDER — MIDAZOLAM HCL 5 MG/5ML IJ SOLN
INTRAMUSCULAR | Status: DC | PRN
  Administered 2019-02-02: 2 mg via INTRAVENOUS

## 2019-02-02 MED ORDER — LIDOCAINE 2% (20 MG/ML) 5 ML SYRINGE
INTRAMUSCULAR | Status: AC
  Filled 2019-02-02: qty 5

## 2019-02-02 MED ORDER — LACTATED RINGERS IV SOLN
INTRAVENOUS | Status: DC
  Administered 2019-02-02: 11:00:00 via INTRAVENOUS
  Filled 2019-02-02: qty 1000

## 2019-02-02 MED ORDER — ONDANSETRON HCL 4 MG/2ML IJ SOLN
INTRAMUSCULAR | Status: DC | PRN
  Administered 2019-02-02: 4 mg via INTRAVENOUS

## 2019-02-02 MED ORDER — PROPOFOL 10 MG/ML IV BOLUS
INTRAVENOUS | Status: DC | PRN
  Administered 2019-02-02: 50 mg via INTRAVENOUS
  Administered 2019-02-02: 150 mg via INTRAVENOUS

## 2019-02-02 MED ORDER — LIDOCAINE-EPINEPHRINE (PF) 1 %-1:200000 IJ SOLN
INTRAMUSCULAR | Status: DC | PRN
  Administered 2019-02-02: 8 mL

## 2019-02-02 MED ORDER — PHENYLEPHRINE 40 MCG/ML (10ML) SYRINGE FOR IV PUSH (FOR BLOOD PRESSURE SUPPORT)
PREFILLED_SYRINGE | INTRAVENOUS | Status: DC | PRN
  Administered 2019-02-02: 80 ug via INTRAVENOUS
  Administered 2019-02-02: 120 ug via INTRAVENOUS
  Administered 2019-02-02: 40 ug via INTRAVENOUS
  Administered 2019-02-02: 120 ug via INTRAVENOUS
  Administered 2019-02-02: 80 ug via INTRAVENOUS

## 2019-02-02 MED ORDER — ACETAMINOPHEN 500 MG PO TABS
ORAL_TABLET | ORAL | Status: AC
  Filled 2019-02-02: qty 2

## 2019-02-02 MED ORDER — EPHEDRINE SULFATE-NACL 50-0.9 MG/10ML-% IV SOSY
PREFILLED_SYRINGE | INTRAVENOUS | Status: DC | PRN
  Administered 2019-02-02 (×2): 10 mg via INTRAVENOUS

## 2019-02-02 MED ORDER — FENTANYL CITRATE (PF) 100 MCG/2ML IJ SOLN
INTRAMUSCULAR | Status: DC | PRN
  Administered 2019-02-02: 50 ug via INTRAVENOUS

## 2019-02-02 MED ORDER — DEXAMETHASONE SODIUM PHOSPHATE 10 MG/ML IJ SOLN
INTRAMUSCULAR | Status: DC | PRN
  Administered 2019-02-02: 10 mg via INTRAVENOUS

## 2019-02-02 SURGICAL SUPPLY — 22 items
BLADE SURG 15 STRL LF DISP TIS (BLADE) ×1 IMPLANT
BLADE SURG 15 STRL SS (BLADE) ×2
GLOVE BIO SURGEON STRL SZ7.5 (GLOVE) ×2 IMPLANT
GLOVE BIO SURGEON STRL SZ8 (GLOVE) ×2 IMPLANT
GLOVE BIOGEL PI IND STRL 7.0 (GLOVE) ×1 IMPLANT
GLOVE BIOGEL PI INDICATOR 7.0 (GLOVE) ×1
GOWN STRL REUS W/ TWL LRG LVL3 (GOWN DISPOSABLE) ×2 IMPLANT
GOWN STRL REUS W/TWL LRG LVL3 (GOWN DISPOSABLE) ×4
KIT TURNOVER CYSTO (KITS) ×2 IMPLANT
NS IRRIG 1000ML POUR BTL (IV SOLUTION) ×2 IMPLANT
PACK VAGINAL MINOR WOMEN LF (CUSTOM PROCEDURE TRAY) ×2 IMPLANT
PAD ABD 8X10 STRL (GAUZE/BANDAGES/DRESSINGS) ×1 IMPLANT
PAD OB MATERNITY 4.3X12.25 (PERSONAL CARE ITEMS) ×2 IMPLANT
PENCIL BUTTON HOLSTER BLD 10FT (ELECTRODE) ×2 IMPLANT
SUT VIC AB 2-0 SH 27 (SUTURE) ×4
SUT VIC AB 2-0 SH 27XBRD (SUTURE) IMPLANT
SUT VIC AB 3-0 PS2 18 (SUTURE)
SUT VIC AB 3-0 PS2 18XBRD (SUTURE) IMPLANT
TOWEL OR 17X26 10 PK STRL BLUE (TOWEL DISPOSABLE) ×4 IMPLANT
TUBE CONNECTING 12X1/4 (SUCTIONS) ×2 IMPLANT
UNDERPAD 30X36 HEAVY ABSORB (UNDERPADS AND DIAPERS) ×2 IMPLANT
YANKAUER SUCT BULB TIP NO VENT (SUCTIONS) ×2 IMPLANT

## 2019-02-02 NOTE — Op Note (Signed)
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  OPERATION:  Wide excision of a left vulvar lesion of VIN3.    SURGEON:  Newton Pigg, MD   ANESTHESIA:  General with mask.  DESCRIPTION OF PROCEDURE:  The patient was brought to the operating room, placed under satisfactory general anesthesia and placed in lithotomy position.  Exam was limited to the vulva.  The vulva had a lesion that was apparent above the surface level of  the vulvar tissue, warty-appearing and it appeared white in the office.  There was minimal other VIN 3 because I had excised 2 large biopsies with an 8 mm punch in the office.  The patient was prepped with Betadine solution after a timeout was done  identifying the patient her condition and the proposed surgery.  She did have sequential compression devices on.  She was in the lithotomy position.  The extent of the lesion was identified after prepping with Betadine and I used a marking pen to outline  an area 1 cm around the entire lesion, trying to stay away from the anus.  I did stay away from the anus, but it was close to the anus at its medial margin.  I then injected the area with 1% Xylocaine with 1:200,000 epinephrine, 8 mL total.  Then, I  began inferiorly and medially to do a wide excision of the lesions.  At the end of the resection, I established hemostasis with the cautery and then I reconstructed the tissue with interrupted vertical mattress sutures of 2-0 Vicryl.  There was no  bleeding of significance.  The area was hemostatic and the procedure was terminated.  I then placed the specimen on, a cork-board orienting it at 4 o'clock to  the vulva and the procedure was terminated.  anesthesia reported that there had been some  hypotension into the 80s during the procedure, but there was no significant contributing factor, thought it was due to the patient's state of health and multiple medications.  The patient will be returned to the recovery room for further observation.   There were no sponges left in the vagina.  The patient tolerated the procedure well to this point.  Blood loss was no more than 15 mL.  VN/NUANCE  D:02/02/2019 T:02/02/2019 JOB:[id number]

## 2019-02-02 NOTE — Discharge Instructions (Signed)
Post Anesthesia Home Care Instructions  Activity: Get plenty of rest for the remainder of the day. A responsible individual must stay with you for 24 hours following the procedure.  For the next 24 hours, DO NOT: -Drive a car -Paediatric nurse -Drink alcoholic beverages -Take any medication unless instructed by your physician -Make any legal decisions or sign important papers.  Meals: Start with liquid foods such as gelatin or soup. Progress to regular foods as tolerated. Avoid greasy, spicy, heavy foods. If nausea and/or vomiting occur, drink only clear liquids until the nausea and/or vomiting subsides. Call your physician if vomiting continues.  Special Instructions/Symptoms: Your throat may feel dry or sore from the anesthesia or the breathing tube placed in your throat during surgery. If this causes discomfort, gargle with warm salt water. The discomfort should disappear within 24 hours.  If you had a scopolamine patch placed behind your ear for the management of post- operative nausea and/or vomiting:  1. The medication in the patch is effective for 72 hours, after which it should be removed.  Wrap patch in a tissue and discard in the trash. Wash hands thoroughly with soap and water. 2. You may remove the patch earlier than 72 hours if you experience unpleasant side effects which may include dry mouth, dizziness or visual disturbances. 3. Avoid touching the patch. Wash your hands with soap and water after contact with the patch.     Wound Care, Adult Taking care of your wound properly can help to prevent pain, infection, and scarring. It can also help your wound to heal more quickly. How to care for your wound Wound care      Follow instructions from your health care provider about how to take care of your wound. Make sure you: ? Wash your hands with soap and water before you change the bandage (dressing). If soap and water are not available, use hand sanitizer. ? Change  your dressing as told by your health care provider. ? Leave stitches (sutures), skin glue, or adhesive strips in place. These skin closures may need to stay in place for 2 weeks or longer. If adhesive strip edges start to loosen and curl up, you may trim the loose edges. Do not remove adhesive strips completely unless your health care provider tells you to do that.  Check your wound area every day for signs of infection. Check for: ? Redness, swelling, or pain. ? Fluid or blood. ? Warmth. ? Pus or a bad smell.  Ask your health care provider if you should clean the wound with mild soap and water. Doing this may include: ? Using a clean towel to pat the wound dry after cleaning it. Do not rub or scrub the wound. ? Applying a cream or ointment. Do this only as told by your health care provider. ? Covering the incision with a clean dressing.  Ask your health care provider when you can leave the wound uncovered.  Keep the dressing dry until your health care provider says it can be removed. Do not take baths, swim, use a hot tub, or do anything that would put the wound underwater until your health care provider approves. Ask your health care provider if you can take showers. You may only be allowed to take sponge baths. Medicines   If you were prescribed an antibiotic medicine, cream, or ointment, take or use the antibiotic as told by your health care provider. Do not stop taking or using the antibiotic even if your condition  improves.  Take over-the-counter and prescription medicines only as told by your health care provider. If you were prescribed pain medicine, take it 30 or more minutes before you do any wound care or as told by your health care provider. General instructions  Return to your normal activities as told by your health care provider. Ask your health care provider what activities are safe.  Do not scratch or pick at the wound.  Do not use any products that contain nicotine or  tobacco, such as cigarettes and e-cigarettes. These may delay wound healing. If you need help quitting, ask your health care provider.  Keep all follow-up visits as told by your health care provider. This is important.  Eat a diet that includes protein, vitamin A, vitamin C, and other nutrient-rich foods to help the wound heal. ? Foods rich in protein include meat, dairy, beans, nuts, and other sources. ? Foods rich in vitamin A include carrots and dark green, leafy vegetables. ? Foods rich in vitamin C include citrus, tomatoes, and other fruits and vegetables. ? Nutrient-rich foods have protein, carbohydrates, fat, vitamins, or minerals. Eat a variety of healthy foods including vegetables, fruits, and whole grains. Contact a health care provider if:  You received a tetanus shot and you have swelling, severe pain, redness, or bleeding at the injection site.  Your pain is not controlled with medicine.  You have redness, swelling, or pain around the wound.  You have fluid or blood coming from the wound.  Your wound feels warm to the touch.  You have pus or a bad smell coming from the wound.  You have a fever or chills.  You are nauseous or you vomit.  You are dizzy. Get help right away if:  You have a red streak going away from your wound.  The edges of the wound open up and separate.  Your wound is bleeding, and the bleeding does not stop with gentle pressure.  You have a rash.  You faint.  You have trouble breathing. Summary  Always wash your hands with soap and water before changing your bandage (dressing).  To help with healing, eat foods that are rich in protein, vitamin A, vitamin C, and other nutrients.  Check your wound every day for signs of infection. Contact your health care provider if you suspect that your wound is infected. This information is not intended to replace advice given to you by your health care provider. Make sure you discuss any questions you  have with your health care provider. Document Released: 11/21/2007 Document Revised: 06/01/2018 Document Reviewed: 08/29/2015 Elsevier Patient Education  2020 [employer].

## 2019-02-02 NOTE — Anesthesia Preprocedure Evaluation (Addendum)
Anesthesia Evaluation  Patient identified by MRN, date of birth, ID band Patient awake    Reviewed: Allergy & Precautions, NPO status , Patient's Chart, lab work & pertinent test results  Airway Mallampati: II  TM Distance: >3 FB Neck ROM: Full    Dental no notable dental hx. (+) Teeth Intact   Pulmonary asthma , former smoker,    Pulmonary exam normal breath sounds clear to auscultation       Cardiovascular hypertension, Pt. on medications + DVT  Normal cardiovascular exam Rhythm:Regular Rate:Normal  HLD  TTE 2018 - Left ventricle: The cavity size was mildly dilated. Systolic function was mildly to moderately reduced. The estimated ejection fraction was in the range of 40% to 45%. Mild diffuse hypokinesis. Doppler parameters are consistent with abnormal left ventricular relaxation (grade 1 diastolic dysfunction). - no significant valvular abnormalities  LHC 2018 Normal coronary arteries. Left dominant coronary circulation. Normal left ventricular function. EF 50-55%. Normal EDP. RECOMMENDATIONS: No further cardiac workup for chest discomfort. Musculoskeletal and/or GI are most likely explanations. Myocardial perfusion imaging is false positive.   Neuro/Psych negative neurological ROS  negative psych ROS   GI/Hepatic Neg liver ROS, GERD  Medicated,  Endo/Other  negative endocrine ROS  Renal/GU Renal InsufficiencyRenal disease (Cr 1.46, K 4.3 )  negative genitourinary   Musculoskeletal negative musculoskeletal ROS (+)   Abdominal   Peds  Hematology negative hematology ROS (+)   Anesthesia Other Findings   Reproductive/Obstetrics                            EKG: normal sinus rhythm, LBBB.  LBBB since prior to 2018  Echo: Left ventricle: The cavity size was mildly dilated. Systolic   function was mildly to moderately reduced. The estimated ejection   fraction was in the range of 40%  to 45%. Mild diffuse   hypokinesis. There was an increased relative contribution of   atrial contraction to ventricular filling. Doppler parameters are   consistent with abnormal left ventricular relaxation (grade 1   diastolic dysfunction).   Anesthesia Physical Anesthesia Plan  ASA: III  Anesthesia Plan: General   Post-op Pain Management:    Induction: Intravenous  PONV Risk Score and Plan: 3 and Ondansetron, Dexamethasone and Midazolam  Airway Management Planned: LMA  Additional Equipment:   Intra-op Plan:   Post-operative Plan: Extubation in OR  Informed Consent: I have reviewed the patients History and Physical, chart, labs and discussed the procedure including the risks, benefits and alternatives for the proposed anesthesia with the patient or authorized representative who has indicated his/her understanding and acceptance.     Dental advisory given  Plan Discussed with: CRNA  Anesthesia Plan Comments:         Anesthesia Quick Evaluation

## 2019-02-02 NOTE — Anesthesia Procedure Notes (Signed)
Procedure Name: LMA Insertion Date/Time: 02/02/2019 10:59 AM Performed by: Bonney Aid, CRNA Pre-anesthesia Checklist: Patient identified, Emergency Drugs available, Suction available and Patient being monitored Patient Re-evaluated:Patient Re-evaluated prior to induction Oxygen Delivery Method: Circle system utilized Preoxygenation: Pre-oxygenation with 100% oxygen Induction Type: IV induction Ventilation: Mask ventilation without difficulty LMA: LMA inserted LMA Size: 4.0 Number of attempts: 1 Airway Equipment and Method: Bite block Placement Confirmation: positive ETCO2 Tube secured with: Tape Dental Injury: Teeth and Oropharynx as per pre-operative assessment

## 2019-02-02 NOTE — Progress Notes (Signed)
I examined this lady 02-01-19 and she reports no change in her health since that time.

## 2019-02-02 NOTE — Transfer of Care (Signed)
Immediate Anesthesia Transfer of Care Note  Patient: Caitlin Wall  Procedure(s) Performed: VULVECTOMY PARTIAL (N/A )  Patient Location: PACU  Anesthesia Type:General  Level of Consciousness: awake, alert  and oriented  Airway & Oxygen Therapy: Patient Spontanous Breathing and Patient connected to nasal cannula oxygen  Post-op Assessment: Report given to RN  Post vital signs: Reviewed and stable  Last Vitals: 96/44 Vitals Value Taken Time  BP    Temp    Pulse 92 02/02/19 1141  Resp 15 02/02/19 1141  SpO2 100 % 02/02/19 1141  Vitals shown include unvalidated device data.  Last Pain:  Vitals:   02/02/19 0843  TempSrc: Oral  PainSc: 0-No pain      Patients Stated Pain Goal: 5 ([id number])  Complications: No apparent anesthesia complications

## 2019-02-02 NOTE — Progress Notes (Signed)
Pre op dx: VIN 3 of the left vulva at 4 o'clock Post op dx: Same   Op: Partial vulvectomy of VIN 3 left vulva  Op: Minnetta Sandora   General anesthesia   EBL 10-15 cc's.

## 2019-02-02 NOTE — Anesthesia Postprocedure Evaluation (Signed)
Anesthesia Post Note  Patient: Caitlin Wall  Procedure(s) Performed: VULVECTOMY PARTIAL (N/A )     Patient location during evaluation: PACU Anesthesia Type: General Level of consciousness: awake and alert Pain management: pain level controlled Vital Signs Assessment: post-procedure vital signs reviewed and stable Respiratory status: spontaneous breathing, nonlabored ventilation, respiratory function stable and patient connected to nasal cannula oxygen Cardiovascular status: blood pressure returned to baseline and stable Postop Assessment: no apparent nausea or vomiting Anesthetic complications: no    Last Vitals:  Vitals:   02/02/19 1230 02/02/19 1310  BP: 101/63 112/64  Pulse: 89 94  Resp: (!) 23 16  Temp:  36.9 C  SpO2: 95% 95%    Last Pain:  Vitals:   02/02/19 1215  TempSrc:   PainSc: 0-No pain                 Rhyatt Muska L Tashara Suder

## 2019-02-03 ENCOUNTER — Encounter (HOSPITAL_BASED_OUTPATIENT_CLINIC_OR_DEPARTMENT_OTHER): Payer: Self-pay | Admitting: Obstetrics and Gynecology

## 2019-02-04 ENCOUNTER — Telehealth: Payer: Self-pay | Admitting: Oncology

## 2019-02-04 LAB — SURGICAL PATHOLOGY

## 2019-02-04 NOTE — Telephone Encounter (Signed)
Returned patient's phone call regarding rescheduling February and January appointments, per patient's request appointment times have been rescheduled.

## 2019-02-08 ENCOUNTER — Other Ambulatory Visit: Payer: Self-pay

## 2019-02-08 ENCOUNTER — Inpatient Hospital Stay: Payer: 59

## 2019-02-08 ENCOUNTER — Inpatient Hospital Stay: Payer: 59 | Attending: Oncology

## 2019-02-08 VITALS — BP 116/48 | HR 83 | Temp 97.8°F | Resp 16

## 2019-02-08 DIAGNOSIS — E785 Hyperlipidemia, unspecified: Secondary | ICD-10-CM | POA: Insufficient documentation

## 2019-02-08 DIAGNOSIS — Z803 Family history of malignant neoplasm of breast: Secondary | ICD-10-CM | POA: Insufficient documentation

## 2019-02-08 DIAGNOSIS — Z5111 Encounter for antineoplastic chemotherapy: Secondary | ICD-10-CM | POA: Diagnosis present

## 2019-02-08 DIAGNOSIS — Z801 Family history of malignant neoplasm of trachea, bronchus and lung: Secondary | ICD-10-CM | POA: Insufficient documentation

## 2019-02-08 DIAGNOSIS — Z79899 Other long term (current) drug therapy: Secondary | ICD-10-CM | POA: Diagnosis not present

## 2019-02-08 DIAGNOSIS — C7951 Secondary malignant neoplasm of bone: Secondary | ICD-10-CM

## 2019-02-08 DIAGNOSIS — I129 Hypertensive chronic kidney disease with stage 1 through stage 4 chronic kidney disease, or unspecified chronic kidney disease: Secondary | ICD-10-CM | POA: Insufficient documentation

## 2019-02-08 DIAGNOSIS — G893 Neoplasm related pain (acute) (chronic): Secondary | ICD-10-CM

## 2019-02-08 DIAGNOSIS — Z923 Personal history of irradiation: Secondary | ICD-10-CM | POA: Diagnosis not present

## 2019-02-08 DIAGNOSIS — C50512 Malignant neoplasm of lower-outer quadrant of left female breast: Secondary | ICD-10-CM | POA: Diagnosis present

## 2019-02-08 DIAGNOSIS — Z87891 Personal history of nicotine dependence: Secondary | ICD-10-CM | POA: Diagnosis not present

## 2019-02-08 DIAGNOSIS — I824Z2 Acute embolism and thrombosis of unspecified deep veins of left distal lower extremity: Secondary | ICD-10-CM

## 2019-02-08 DIAGNOSIS — Z170 Estrogen receptor positive status [ER+]: Secondary | ICD-10-CM

## 2019-02-08 DIAGNOSIS — N183 Chronic kidney disease, stage 3 unspecified: Secondary | ICD-10-CM | POA: Diagnosis not present

## 2019-02-08 DIAGNOSIS — K219 Gastro-esophageal reflux disease without esophagitis: Secondary | ICD-10-CM | POA: Diagnosis not present

## 2019-02-08 LAB — COMPREHENSIVE METABOLIC PANEL
ALT: 20 U/L (ref 0–44)
AST: 30 U/L (ref 15–41)
Albumin: 3.6 g/dL (ref 3.5–5.0)
Alkaline Phosphatase: 93 U/L (ref 38–126)
Anion gap: 9 (ref 5–15)
BUN: 24 mg/dL — ABNORMAL HIGH (ref 8–23)
CO2: 29 mmol/L (ref 22–32)
Calcium: 9.8 mg/dL (ref 8.9–10.3)
Chloride: 104 mmol/L (ref 98–111)
Creatinine, Ser: 1.41 mg/dL — ABNORMAL HIGH (ref 0.44–1.00)
GFR calc Af Amer: 46 mL/min — ABNORMAL LOW (ref >60)
GFR calc non Af Amer: 40 mL/min — ABNORMAL LOW (ref >60)
Glucose, Bld: 98 mg/dL (ref 70–99)
Potassium: 4.2 mmol/L (ref 3.5–5.1)
Sodium: 142 mmol/L (ref 135–145)
Total Bilirubin: 1.6 mg/dL — ABNORMAL HIGH (ref 0.3–1.2)
Total Protein: 6.5 g/dL (ref 6.5–8.1)

## 2019-02-08 LAB — CBC WITH DIFFERENTIAL/PLATELET
Abs Immature Granulocytes: 0.02 10*3/uL (ref 0.00–0.07)
Basophils Absolute: 0.1 10*3/uL (ref 0.0–0.1)
Basophils Relative: 2 %
Eosinophils Absolute: 0.2 10*3/uL (ref 0.0–0.5)
Eosinophils Relative: 5 %
HCT: 36.2 % (ref 36.0–46.0)
Hemoglobin: 12.5 g/dL (ref 12.0–15.0)
Immature Granulocytes: 1 %
Lymphocytes Relative: 17 %
Lymphs Abs: 0.7 10*3/uL (ref 0.7–4.0)
MCH: 37.1 pg — ABNORMAL HIGH (ref 26.0–34.0)
MCHC: 34.5 g/dL (ref 30.0–36.0)
MCV: 107.4 fL — ABNORMAL HIGH (ref 80.0–100.0)
Monocytes Absolute: 0.4 10*3/uL (ref 0.1–1.0)
Monocytes Relative: 10 %
Neutro Abs: 2.7 10*3/uL (ref 1.7–7.7)
Neutrophils Relative %: 65 %
Platelets: 240 10*3/uL (ref 150–400)
RBC: 3.37 MIL/uL — ABNORMAL LOW (ref 3.87–5.11)
RDW: 14.1 % (ref 11.5–15.5)
WBC: 4.1 10*3/uL (ref 4.0–10.5)
nRBC: 0 % (ref 0.0–0.2)

## 2019-02-08 MED ORDER — DENOSUMAB 120 MG/1.7ML ~~LOC~~ SOLN
120.0000 mg | Freq: Once | SUBCUTANEOUS | Status: AC
  Administered 2019-02-08: 120 mg via SUBCUTANEOUS

## 2019-02-08 MED ORDER — FULVESTRANT 250 MG/5ML IM SOLN
INTRAMUSCULAR | Status: AC
  Filled 2019-02-08: qty 10

## 2019-02-08 MED ORDER — FULVESTRANT 250 MG/5ML IM SOLN
500.0000 mg | Freq: Once | INTRAMUSCULAR | Status: AC
  Administered 2019-02-08: 500 mg via INTRAMUSCULAR

## 2019-02-08 MED ORDER — DENOSUMAB 120 MG/1.7ML ~~LOC~~ SOLN
SUBCUTANEOUS | Status: AC
  Filled 2019-02-08: qty 1.7

## 2019-02-08 NOTE — Patient Instructions (Signed)
Denosumab injection What is this medicine? DENOSUMAB (den oh sue mab) slows bone breakdown. Prolia is used to treat osteoporosis in women after menopause and in men, and in people who are taking corticosteroids for 6 months or more. Xgeva is used to treat a high calcium level due to cancer and to prevent bone fractures and other bone problems caused by multiple myeloma or cancer bone metastases. Xgeva is also used to treat giant cell tumor of the bone. This medicine may be used for other purposes; ask your health care provider or pharmacist if you have questions. COMMON BRAND NAME(S): Prolia, XGEVA What should I tell my health care provider before I take this medicine? They need to know if you have any of these conditions:  dental disease  having surgery or tooth extraction  infection  kidney disease  low levels of calcium or Vitamin D in the blood  malnutrition  on hemodialysis  skin conditions or sensitivity  thyroid or parathyroid disease  an unusual reaction to denosumab, other medicines, foods, dyes, or preservatives  pregnant or trying to get pregnant  breast-feeding How should I use this medicine? This medicine is for injection under the skin. It is given by a health care professional in a hospital or clinic setting. A special MedGuide will be given to you before each treatment. Be sure to read this information carefully each time. For Prolia, talk to your pediatrician regarding the use of this medicine in children. Special care may be needed. For Xgeva, talk to your pediatrician regarding the use of this medicine in children. While this drug may be prescribed for children as young as 13 years for selected conditions, precautions do apply. Overdosage: If you think you have taken too much of this medicine contact a poison control center or emergency room at once. NOTE: This medicine is only for you. Do not share this medicine with others. What if I miss a dose? It is  important not to miss your dose. Call your doctor or health care professional if you are unable to keep an appointment. What may interact with this medicine? Do not take this medicine with any of the following medications:  other medicines containing denosumab This medicine may also interact with the following medications:  medicines that lower your chance of fighting infection  steroid medicines like prednisone or cortisone This list may not describe all possible interactions. Give your health care provider a list of all the medicines, herbs, non-prescription drugs, or dietary supplements you use. Also tell them if you smoke, drink alcohol, or use illegal drugs. Some items may interact with your medicine. What should I watch for while using this medicine? Visit your doctor or health care professional for regular checks on your progress. Your doctor or health care professional may order blood tests and other tests to see how you are doing. Call your doctor or health care professional for advice if you get a fever, chills or sore throat, or other symptoms of a cold or flu. Do not treat yourself. This drug may decrease your body's ability to fight infection. Try to avoid being around people who are sick. You should make sure you get enough calcium and vitamin D while you are taking this medicine, unless your doctor tells you not to. Discuss the foods you eat and the vitamins you take with your health care professional. See your dentist regularly. Brush and floss your teeth as directed. Before you have any dental work done, tell your dentist you are   receiving this medicine. Do not become pregnant while taking this medicine or for 5 months after stopping it. Talk with your doctor or health care professional about your birth control options while taking this medicine. Women should inform their doctor if they wish to become pregnant or think they might be pregnant. There is a potential for serious side  effects to an unborn child. Talk to your health care professional or pharmacist for more information. What side effects may I notice from receiving this medicine? Side effects that you should report to your doctor or health care professional as soon as possible:  allergic reactions like skin rash, itching or hives, swelling of the face, lips, or tongue  bone pain  breathing problems  dizziness  jaw pain, especially after dental work  redness, blistering, peeling of the skin  signs and symptoms of infection like fever or chills; cough; sore throat; pain or trouble passing urine  signs of low calcium like fast heartbeat, muscle cramps or muscle pain; pain, tingling, numbness in the hands or feet; seizures  unusual bleeding or bruising  unusually weak or tired Side effects that usually do not require medical attention (report to your doctor or health care professional if they continue or are bothersome):  constipation  diarrhea  headache  joint pain  loss of appetite  muscle pain  runny nose  tiredness  upset stomach This list may not describe all possible side effects. Call your doctor for medical advice about side effects. You may report side effects to FDA at 1-800-FDA-1088. Where should I keep my medicine? This medicine is only given in a clinic, doctor's office, or other health care setting and will not be stored at home. NOTE: This sheet is a summary. It may not cover all possible information. If you have questions about this medicine, talk to your doctor, pharmacist, or health care provider.  2020 [employer] Standard (2017-06-20 16:10:44)   Fulvestrant injection What is this medicine? FULVESTRANT (ful VES trant) blocks the effects of estrogen. It is used to treat breast cancer. This medicine may be used for other purposes; ask your health care provider or pharmacist if you have questions. COMMON BRAND NAME(S): FASLODEX What should I tell my health care  provider before I take this medicine? They need to know if you have any of these conditions:  bleeding disorders  liver disease  low blood counts, like low white cell, platelet, or red cell counts  an unusual or allergic reaction to fulvestrant, other medicines, foods, dyes, or preservatives  pregnant or trying to get pregnant  breast-feeding How should I use this medicine? This medicine is for injection into a muscle. It is usually given by a health care professional in a hospital or clinic setting. Talk to your pediatrician regarding the use of this medicine in children. Special care may be needed. Overdosage: If you think you have taken too much of this medicine contact a poison control center or emergency room at once. NOTE: This medicine is only for you. Do not share this medicine with others. What if I miss a dose? It is important not to miss your dose. Call your doctor or health care professional if you are unable to keep an appointment. What may interact with this medicine?  medicines that treat or prevent blood clots like warfarin, enoxaparin, dalteparin, apixaban, dabigatran, and rivaroxaban This list may not describe all possible interactions. Give your health care provider a list of all the medicines, herbs, non-prescription drugs, or dietary supplements  you use. Also tell them if you smoke, drink alcohol, or use illegal drugs. Some items may interact with your medicine. What should I watch for while using this medicine? Your condition will be monitored carefully while you are receiving this medicine. You will need important blood work done while you are taking this medicine. Do not become pregnant while taking this medicine or for at least 1 year after stopping it. Women of child-bearing potential will need to have a negative pregnancy test before starting this medicine. Women should inform their doctor if they wish to become pregnant or think they might be pregnant. There is  a potential for serious side effects to an unborn child. Men should inform their doctors if they wish to father a child. This medicine may lower sperm counts. Talk to your health care professional or pharmacist for more information. Do not breast-feed an infant while taking this medicine or for 1 year after the last dose. What side effects may I notice from receiving this medicine? Side effects that you should report to your doctor or health care professional as soon as possible:  allergic reactions like skin rash, itching or hives, swelling of the face, lips, or tongue  feeling faint or lightheaded, falls  pain, tingling, numbness, or weakness in the legs  signs and symptoms of infection like fever or chills; cough; flu-like symptoms; sore throat  vaginal bleeding Side effects that usually do not require medical attention (report to your doctor or health care professional if they continue or are bothersome):  aches, pains  constipation  diarrhea  headache  hot flashes  nausea, vomiting  pain at site where injected  stomach pain This list may not describe all possible side effects. Call your doctor for medical advice about side effects. You may report side effects to FDA at 1-800-FDA-1088. Where should I keep my medicine? This drug is given in a hospital or clinic and will not be stored at home. NOTE: This sheet is a summary. It may not cover all possible information. If you have questions about this medicine, talk to your doctor, pharmacist, or health care provider.  2020 [employer] Standard (2017-05-22 11:34:41)

## 2019-02-18 ENCOUNTER — Other Ambulatory Visit: Payer: Self-pay | Admitting: Oncology

## 2019-03-01 ENCOUNTER — Telehealth: Payer: Self-pay | Admitting: Oncology

## 2019-03-01 NOTE — Telephone Encounter (Signed)
Returned patient's phone call regarding rescheduling April and May appointment time, per patient's request appointment time has been rescheduled.

## 2019-03-02 ENCOUNTER — Other Ambulatory Visit: Payer: Self-pay | Admitting: Oncology

## 2019-03-02 DIAGNOSIS — T66XXXA Radiation sickness, unspecified, initial encounter: Secondary | ICD-10-CM

## 2019-03-02 DIAGNOSIS — K208 Other esophagitis without bleeding: Secondary | ICD-10-CM

## 2019-03-02 DIAGNOSIS — C50512 Malignant neoplasm of lower-outer quadrant of left female breast: Secondary | ICD-10-CM

## 2019-03-02 DIAGNOSIS — Z170 Estrogen receptor positive status [ER+]: Secondary | ICD-10-CM

## 2019-03-02 MED ORDER — MORPHINE SULFATE 10 MG/5ML PO SOLN: 2.5000 mg | Freq: Four times a day (QID) | ORAL | 0 refills | Status: DC | PRN

## 2019-03-08 ENCOUNTER — Ambulatory Visit: Payer: 59 | Admitting: Oncology

## 2019-03-08 ENCOUNTER — Ambulatory Visit: Payer: 59

## 2019-03-08 ENCOUNTER — Other Ambulatory Visit: Payer: 59

## 2019-03-08 NOTE — Progress Notes (Signed)
[facility]  [phone] [phone]    ID: Caitlin Wall DOB: [birth year]  MR#: [id number]  YBO#:[id number]  Patient Care Team: Biagio Borg, MD as PCP - General Tamala Julian Lynnell Dike, MD as PCP - Cardiology (Cardiology) Janaiya Beauchesne, Virgie Dad, MD as Consulting Physician (Oncology) Kyung Rudd, MD as Consulting Physician (Radiation Oncology) Jovita Kussmaul, MD as Consulting Physician (General Surgery) Sylvan Cheese, NP as Nurse Practitioner (Hematology and Oncology) Tanda Rockers, MD as Consulting Physician (Pulmonary Disease) Newton Pigg, MD as Consulting Physician (Obstetrics and Gynecology) OTHER MD: Keturah Barre MD   CHIEF COMPLAINT: Metastatic estrogen receptor positive breast cancer  CURRENT TREATMENT: Denosumab/Xgeva, fluvestrant, palbociclib   INTERVAL HISTORY: [name] returns today for follow-up and treatment of her metastatic estrogen receptor positive breast cancer.   She continues on denosumab/Xgeva.  She receives this every 28 days.  She has been on this for a year with no side effects.  She saw her dentist a few months ago and generally sees them every 6 months.  There have been no extractions and no symptoms of osteonecrosis.  She also continues on fulvestrant, with her most recent dose received on 02/08/2019.   She will receive another dose today.  She has no side effects from this that she is aware of  She continues on palbociclib/Ibrance still of the initial dose of 125 mg daily 21 days on 7 days off..  Today she did not complain of fatigue or nausea with this.  Since her last visit, she underwent vulvar excision on 02/02/2019 under Dr. Ulanda Edison. Pathology from the procedure [id number]) revealed: high grade squamous intraepithelial lesion, VIN-III; margins negative.  [name] was supposed to have had a repeat bone scan at the very end of December.  She says that she was not called to schedule that.   REVIEW OF  SYSTEMS: Today is Caitlin Wall's birthday! She is very concerned because her insurance has changed.  She tells me she cannot afford her Symbicort and she is going to be calling Dr. Marlene Lard office to see if they can find something a little cheaper.  Her co-pay for that is about $200 a month.  She does some walking in her work and also when she goes shopping and outside.  Sometimes she has a little bit extra pain when she does extra walking but her pain is very irregular, mostly involves the right rib cage area, and she is taking Tylenol and ibuprofen for that.  She does have some morphine and she sometimes uses that at night.  She has constipation but she tells me this is longstanding with her.  Aside from these issues a detailed review of systems today was stable   BREAST CANCER HISTORY:  From the original intake note:   Caitlin Wall [facility]. She has subpectoral saline implants in place. There was a possible asymmetry in the left breast and she was recalled for left diagnostic mammography with tomosynthesis and left breast ultrasonography 11/03/2014. The breast density was category C. In the left breast lower outer quadrant there was an irregular mass which was not palpable and which by ultrasonography measured 0.6 cm. The left axilla was sonographically benign. Biopsy of this mass 11/09/2014 showed ([id number]) fibroadipose adipose tissue. This was felt to be discordant.  Accordingly the patient was referred to surgery and she underwent left breast radioactive seed localized lumpectomy 12/28/2014. The pathology from that procedure (([id number]) showed ductal carcinoma in situ,  high-grade, measuring 0.2 cm. This was less than 0.1 cm from the posterior margin. The cells was estrogen receptor positive at 95%, with strong staining intensity, progesterone receptor positive at 30% with moderate staining intensity.  Her subsequent history is as detailed  below.   PAST MEDICAL HISTORY: Past Medical History:  Diagnosis Date  . Acute asthma 07/27/2012  . Breast cancer ([place]) 12/28/14   Left BresastDCIS  . COLONIC POLYPS, HX OF 11/04/2007  . DVT (deep venous thrombosis) ([facility]) 2018   left leg  . GERD 11/04/2007  . GERD (gastroesophageal reflux disease)   . HAIR LOSS 11/04/2007  . Hearing loss    bilateral - wears hearing aids  . Hx of cardiac cath 11/15/2016   a. LHC 11/15/16 showed normal coronaries and EF 50-55% with normal EDP. (done for false positive NST)  . Hx of colonic polyps 1995   adenomatous  . Hyperlipidemia   . HYPERLIPIDEMIA 11/04/2007  . Hypertension   . HYPERTENSION 06/07/2009  . LBBB (left bundle branch block) 07/26/2011   no current problems  . Personal history of radiation therapy   . S/P radiation therapy 02/02/15-03/24/15   left breast 60.4GY    PAST SURGICAL HISTORY: Past Surgical History:  Procedure Laterality Date  . ABDOMINAL HYSTERECTOMY     partial  . ANTERIOR FUSION CERVICAL SPINE    . bilat ear surgury/hearing loss     wears hearing aids  . BREAST LUMPECTOMY Left 2016  . BREAST LUMPECTOMY WITH RADIOACTIVE SEED LOCALIZATION Left 12/28/2014   Procedure: BREAST LUMPECTOMY WITH RADIOACTIVE SEED LOCALIZATION;  Surgeon: Autumn Messing III, MD;  Location: [facility];  Service: General;  Laterality: Left;  . COLONOSCOPY    . LEFT HEART CATH AND CORONARY ANGIOGRAPHY N/A 11/15/2016   Procedure: LEFT HEART CATH AND CORONARY ANGIOGRAPHY;  Surgeon: Belva Crome, MD;  Location: [facility];  Service: Cardiovascular;  Laterality: N/A;  . POLYPECTOMY    . UPPER GASTROINTESTINAL ENDOSCOPY    . VULVECTOMY PARTIAL N/A 02/02/2019   Procedure: VULVECTOMY PARTIAL;  Surgeon: Newton Pigg, MD;  Location: [facility];  Service: Gynecology;  Laterality: N/A;  . WISDOM TOOTH EXTRACTION      FAMILY HISTORY: Family History  Problem Relation Age of Onset  . Breast cancer Other   . Cancer Paternal  Grandmother        breast  . Lung cancer Father   . Breast cancer Maternal Grandmother   . Healthy Mother   . Coronary artery disease Neg Hx   . Colon cancer Neg Hx   . Pancreatic cancer Neg Hx   . Rectal cancer Neg Hx   . Stomach cancer Neg Hx   . Esophageal cancer Neg Hx    [name] died from lung cancer in the setting of tobacco abuse at age 73. [name] died from "old age" at age 21. The patient had one brother, 3 sisters. The brother had" I cancer" requiring enucleation--the patient does not know whether this was a primary ocular melanoma. [name] was diagnosed with breast cancer but the patient does not know at what age. There is no other history of breast or ovarian cancer in the family   GYNECOLOGIC HISTORY:  No LMP recorded (lmp unknown). Patient has had a hysterectomy. Menarche age 58, first live birth age 89. The patient is GX P3. She is status post hysterectomy without salpingo-oophorectomy. She did not take hormone replacement. She did use oral contraceptives  remotely without any complications.   SOCIAL HISTORY:  Yetzali works in accounts payable. Her husband [name] is disabled because of back problems. [name] is a Marine scientist working in rehabilitation in [place]. [name] lives in [place] where he works as a Games developer. [name] lives in [place] where he works as a Emergency planning/management officer. The patient has 4 grandchildren. She is not a Ambulance person.    ADVANCED DIRECTIVES: Not in place   HEALTH MAINTENANCE: Social History   Tobacco Use  . Smoking status: Former Smoker    Packs/day: 1.00    Years: 20.00    Pack years: 20.00    Types: Cigarettes    Quit date: 08/19/1989    Years since quitting: 29.5  . Smokeless tobacco: Never Used  Substance Use Topics  . Alcohol use: Yes    Comment: rare  . Drug use: No    Colonoscopy: 10/2018, repeat 2027 (Dr. Fuller Plan)  PAP: s/p hysterectomy  Bone density:  02/2018, -1.7  Allergies  Allergen Reactions  . Codeine     REACTION: nausea,  . Tramadol Hcl Nausea Only    Current Outpatient Medications  Medication Sig Dispense Refill  . acetaminophen (TYLENOL) 500 MG tablet Take 1 tablet (500 mg total) by mouth every 8 (eight) hours as needed. Take together with ibuprofen 200 mg tablet 30 tablet 0  . amLODipine (NORVASC) 2.5 MG tablet Take 2.5 mg by mouth daily.    Marland Kitchen aspirin EC 81 MG tablet Take 81 mg by mouth daily.    . budesonide-formoterol (SYMBICORT) 160-4.5 MCG/ACT inhaler Inhale 2 puffs into the lungs 2 (two) times daily. 1 Inhaler 3  . cholecalciferol (VITAMIN D3) 25 MCG (1000 UT) tablet Take 1 tablet (1,000 Units total) by mouth daily.    Marland Kitchen ibuprofen (ADVIL) 200 MG tablet Take 200 mg by mouth every 6 (six) hours as needed.    Marland Kitchen losartan (COZAAR) 100 MG tablet Take 1 tablet by mouth once daily 30 tablet 2  . morphine 10 MG/5ML solution Take 1.3-2.5 mLs (2.6-5 mg total) by mouth every 6 (six) hours as needed for severe pain. 30 mL 0  . palbociclib (IBRANCE) 125 MG tablet TAKE 1 TABLET BY MOUTH ONCE DAILY WITH OR WITHOUT FOOD  FOR 21 DAYS, FOLLOWED BY 7  DAYS OFF, REPEATED EVERY 28 DAYS 21 tablet 6  . pantoprazole (PROTONIX) 40 MG tablet Take 1 tablet (40 mg total) by mouth daily. 90 tablet 4   No current facility-administered medications for this visit.    OBJECTIVE: Middle-aged white woman who appears stated age  56:   03/09/19 0854  BP: (!) 102/42  Pulse: 82  Resp: 18  Temp: 97.9 F (36.6 C)  SpO2: 99%     Body mass index is 29.35 kg/m.    ECOG FS:1 - Symptomatic but completely ambulatory  Filed Weights   03/09/19 0854  Weight: 176 lb 6.4 oz (80 kg)     Sclerae unicteric, EOMs intact Wearing a mask No cervical or supraclavicular adenopathy Lungs no rales or rhonchi Heart regular rate and rhythm Abd soft, nontender, positive bowel sounds MSK no focal spinal tenderness, no upper extremity lymphedema Neuro: nonfocal,  well oriented, appropriate affect Breasts: Deferred   LAB RESULTS:  CMP     Component Value Date/Time   NA 142 02/08/2019 1114   NA 141 04/25/2017 1213   NA 145 07/04/2016 1133   K 4.2 02/08/2019 1114   K 4.2 07/04/2016 1133   CL 104 02/08/2019 1114  CO2 29 02/08/2019 1114   CO2 28 07/04/2016 1133   GLUCOSE 98 02/08/2019 1114   GLUCOSE 101 07/04/2016 1133   BUN 24 (H) 02/08/2019 1114   BUN 18 04/25/2017 1213   BUN 16.0 07/04/2016 1133   CREATININE 1.41 (H) 02/08/2019 1114   CREATININE 1.09 (H) 07/23/2018 1339   CREATININE 0.9 07/04/2016 1133   CALCIUM 9.8 02/08/2019 1114   CALCIUM 9.9 07/04/2016 1133   PROT 6.5 02/08/2019 1114   PROT 7.3 07/04/2016 1133   ALBUMIN 3.6 02/08/2019 1114   ALBUMIN 3.8 07/04/2016 1133   AST 30 02/08/2019 1114   AST 35 07/23/2018 1339   AST 19 07/04/2016 1133   ALT 20 02/08/2019 1114   ALT 25 07/23/2018 1339   ALT 17 07/04/2016 1133   ALKPHOS 93 02/08/2019 1114   ALKPHOS 172 (H) 07/04/2016 1133   BILITOT 1.6 (H) 02/08/2019 1114   BILITOT 1.1 07/23/2018 1339   BILITOT 0.94 07/04/2016 1133   GFRNONAA 40 (L) 02/08/2019 1114   GFRNONAA 55 (L) 07/23/2018 1339   GFRAA 46 (L) 02/08/2019 1114   GFRAA >60 07/23/2018 1339    INo results found for: SPEP, UPEP  Lab Results  Component Value Date   WBC 2.9 (L) 03/09/2019   NEUTROABS 1.7 03/09/2019   HGB 11.8 (L) 03/09/2019   HCT 33.9 (L) 03/09/2019   MCV 107.6 (H) 03/09/2019   PLT 171 03/09/2019      Chemistry      Component Value Date/Time   NA 142 02/08/2019 1114   NA 141 04/25/2017 1213   NA 145 07/04/2016 1133   K 4.2 02/08/2019 1114   K 4.2 07/04/2016 1133   CL 104 02/08/2019 1114   CO2 29 02/08/2019 1114   CO2 28 07/04/2016 1133   BUN 24 (H) 02/08/2019 1114   BUN 18 04/25/2017 1213   BUN 16.0 07/04/2016 1133   CREATININE 1.41 (H) 02/08/2019 1114   CREATININE 1.09 (H) 07/23/2018 1339   CREATININE 0.9 07/04/2016 1133      Component Value Date/Time   CALCIUM 9.8  02/08/2019 1114   CALCIUM 9.9 07/04/2016 1133   ALKPHOS 93 02/08/2019 1114   ALKPHOS 172 (H) 07/04/2016 1133   AST 30 02/08/2019 1114   AST 35 07/23/2018 1339   AST 19 07/04/2016 1133   ALT 20 02/08/2019 1114   ALT 25 07/23/2018 1339   ALT 17 07/04/2016 1133   BILITOT 1.6 (H) 02/08/2019 1114   BILITOT 1.1 07/23/2018 1339   BILITOT 0.94 07/04/2016 1133     No results found for: LABCA2  No components found for: LABCA125  No results for input(s): INR in the last 168 hours.  Urinalysis    Component Value Date/Time   COLORURINE YELLOW 07/20/2018 [place] 07/20/2018 1747   LABSPEC 1.019 07/20/2018 1747   PHURINE 5.0 07/20/2018 1747   GLUCOSEU NEGATIVE 07/20/2018 1747   GLUCOSEU NEGATIVE 10/20/2017 1303   HGBUR NEGATIVE 07/20/2018 1747   BILIRUBINUR NEGATIVE 07/20/2018 1747   KETONESUR 20 (A) 07/20/2018 1747   PROTEINUR NEGATIVE 07/20/2018 1747   UROBILINOGEN 1.0 10/20/2017 1303   NITRITE NEGATIVE 07/20/2018 1747   LEUKOCYTESUR NEGATIVE 07/20/2018 1747    STUDIES: No results found.   ASSESSMENT: 64 y.o. [place] woman status post left lumpectomy 12/28/2014 for ductal carcinoma in situ, high-grade measuring 0.2 cm, estrogen and progesterone receptor positive, with close but negative margins  (1) adjuvant radiation 02/02/2015 through 03/24/2015: The patient initially received a dose of 50.4 Gy  in 28 fractions to the breast using whole-breast tangent fields. This was delivered using a 3-D conformal technique. The patient then received a boost to the seroma. This delivered an additional 10 Gy in 5 fractions using a en face electron field. The total dose was 60.4 Gy.   (2) anastrozole started 04/19/2015   (a) bone density 03/01/2016 shows a T score of -1.2  (3) mildly abnormal hepatic function panel: negative workup for hepatitis B and C, normal alpha-1-anti-trypsin  (4) left lower extremity DVT diagnosed by Doppler ultrasonography 04/16/2016, with  negative CT angiogram chest  (a) on Xarelto starting 04/16/2016   (b) negative hypercoagulable panel, with a minimally abnormal (indeterminate) anti-cardiolipin IgM  ((c) stopped rivaroxaban 07/16/2016, repeat D-Dimer WNL August 2018  METASTATIC DISEASE: December 2019: bone only (5) CT chest 02/06/2018 shows a mixed lytic and sclerotic process with fracture at the inferior manubrium but no other bone lesions.  However bone scan 02/16/2018 shows multiple bone lesions consistent with metastatic disease; no lung or pleura involvement, no adenopathy  (a) Bone marrow biopsy on 03/03/2018 shows metastatic carcinoma consistent with breast primary, estrogen and progesterone receptor strongly positive, HER-2 not amplified (1+)  (b) MRI abdomen and MRI brain at [facility] 03/17/2018 (results viewable in "care everywhere") notes known bone involvement in spine and pelvis, but no other area of metastatic disease in abdomen/pelvis, or brain.  (c) CA 27.29 not informative (37.9 on 03/03/2018)  (6) started denosumab/Xgeva on 03/18/2018, repeated Q28 d  (7) started fulvestrant on 03/03/2018 and palbociclib 03/18/2018 at 125 mg/d 21/7  (a) bone scan 06/10/2018 serves as our new baseline  (b) chest CT scan on 11/11/2018 shows multiple sclerotic bone lesions (treatment effect) no visceral lesions; lungs also show radiation change  (c) CT angio 11/16/2018 shows no evidence of pulmonary embolism  (8) palliative radiation 07/01/2018-07/14/2018:  The patient received 30 Gy in 10 fractions to bilateral ribs and lower thoracic spine  (a) esophagitis secondary to radiation has resolved   PLAN: Ineze is now just over a year out from definitive diagnosis of metastatic breast cancer.  Her disease is clinically stable and she is tolerating her current treatment well.  She is very concerned because her insurance has changed.  They have a $7000 deductible and even with co-pays some of her medications are not affordable.  I  asked her to meet with our financial counselors today but of course that would only be for the bills that we generate.  She has bills from other doctors from pharmacy and from radiology.  She did not get the bone scan done last year on her old insurance.  Right now she is very concerned about money so we are going to postpone the bone scan until March.  I reassured her that the medication she is on can continue to be effective, the average being about 2 years, but many patients doing considerably longer than that with minimal changes.  She will be treated today in an 28 days.  She will reduce return to see me in 2 months and before that visit she will have a repeat bone scan  She knows to call for any other issue that may develop before the next visit.  Total encounter time 28 minutes.Sarajane Jews C. Diondre Pulis, MD 03/09/19 9:19 AM Medical Oncology and Hematology [address] [place], [place] [address] Tel. [phone]    Fax. [phone]   I, Wilburn Mylar, am acting as scribe for Dr. Virgie Dad. Lannie Yusuf.  I, Lurline Del MD, have reviewed the above documentation for accuracy and completeness, and I agree with the above.  *Total Encounter Time as defined by the Centers for Medicare and Medicaid Services includes, in addition to the face-to-face time of a patient visit (documented in the note above) non-face-to-face time: obtaining and reviewing outside history, ordering and reviewing medications, tests or procedures, care coordination (communications with other health care professionals or caregivers) and documentation in the medical record.

## 2019-03-09 ENCOUNTER — Encounter: Payer: Self-pay | Admitting: Oncology

## 2019-03-09 ENCOUNTER — Inpatient Hospital Stay: Payer: BC Managed Care – PPO | Attending: Oncology

## 2019-03-09 ENCOUNTER — Telehealth: Payer: Self-pay | Admitting: Pharmacist

## 2019-03-09 ENCOUNTER — Inpatient Hospital Stay (HOSPITAL_BASED_OUTPATIENT_CLINIC_OR_DEPARTMENT_OTHER): Payer: BC Managed Care – PPO | Admitting: Oncology

## 2019-03-09 ENCOUNTER — Inpatient Hospital Stay: Payer: BC Managed Care – PPO

## 2019-03-09 ENCOUNTER — Other Ambulatory Visit: Payer: Self-pay

## 2019-03-09 ENCOUNTER — Telehealth: Payer: Self-pay

## 2019-03-09 VITALS — BP 102/42 | HR 82 | Temp 97.9°F | Resp 18 | Ht 65.0 in | Wt 176.4 lb

## 2019-03-09 VITALS — BP 119/55 | HR 83 | Temp 97.9°F | Resp 20

## 2019-03-09 DIAGNOSIS — I824Z2 Acute embolism and thrombosis of unspecified deep veins of left distal lower extremity: Secondary | ICD-10-CM

## 2019-03-09 DIAGNOSIS — I10 Essential (primary) hypertension: Secondary | ICD-10-CM | POA: Diagnosis not present

## 2019-03-09 DIAGNOSIS — E785 Hyperlipidemia, unspecified: Secondary | ICD-10-CM | POA: Diagnosis not present

## 2019-03-09 DIAGNOSIS — Z888 Allergy status to other drugs, medicaments and biological substances status: Secondary | ICD-10-CM | POA: Diagnosis not present

## 2019-03-09 DIAGNOSIS — C7951 Secondary malignant neoplasm of bone: Secondary | ICD-10-CM | POA: Diagnosis not present

## 2019-03-09 DIAGNOSIS — C50512 Malignant neoplasm of lower-outer quadrant of left female breast: Secondary | ICD-10-CM

## 2019-03-09 DIAGNOSIS — Z170 Estrogen receptor positive status [ER+]: Secondary | ICD-10-CM | POA: Insufficient documentation

## 2019-03-09 DIAGNOSIS — Z79899 Other long term (current) drug therapy: Secondary | ICD-10-CM | POA: Diagnosis not present

## 2019-03-09 DIAGNOSIS — Z90710 Acquired absence of both cervix and uterus: Secondary | ICD-10-CM | POA: Insufficient documentation

## 2019-03-09 DIAGNOSIS — Z885 Allergy status to narcotic agent status: Secondary | ICD-10-CM | POA: Diagnosis not present

## 2019-03-09 DIAGNOSIS — Z87891 Personal history of nicotine dependence: Secondary | ICD-10-CM | POA: Diagnosis not present

## 2019-03-09 DIAGNOSIS — Z5111 Encounter for antineoplastic chemotherapy: Secondary | ICD-10-CM | POA: Insufficient documentation

## 2019-03-09 DIAGNOSIS — Z7901 Long term (current) use of anticoagulants: Secondary | ICD-10-CM | POA: Insufficient documentation

## 2019-03-09 DIAGNOSIS — K2100 Gastro-esophageal reflux disease with esophagitis, without bleeding: Secondary | ICD-10-CM | POA: Diagnosis not present

## 2019-03-09 DIAGNOSIS — Z803 Family history of malignant neoplasm of breast: Secondary | ICD-10-CM | POA: Insufficient documentation

## 2019-03-09 DIAGNOSIS — Z8719 Personal history of other diseases of the digestive system: Secondary | ICD-10-CM | POA: Insufficient documentation

## 2019-03-09 DIAGNOSIS — Z7189 Other specified counseling: Secondary | ICD-10-CM | POA: Diagnosis not present

## 2019-03-09 DIAGNOSIS — Z86718 Personal history of other venous thrombosis and embolism: Secondary | ICD-10-CM | POA: Insufficient documentation

## 2019-03-09 DIAGNOSIS — J3089 Other allergic rhinitis: Secondary | ICD-10-CM

## 2019-03-09 DIAGNOSIS — J302 Other seasonal allergic rhinitis: Secondary | ICD-10-CM

## 2019-03-09 DIAGNOSIS — G893 Neoplasm related pain (acute) (chronic): Secondary | ICD-10-CM

## 2019-03-09 DIAGNOSIS — R918 Other nonspecific abnormal finding of lung field: Secondary | ICD-10-CM

## 2019-03-09 DIAGNOSIS — Z801 Family history of malignant neoplasm of trachea, bronchus and lung: Secondary | ICD-10-CM | POA: Insufficient documentation

## 2019-03-09 LAB — CBC WITH DIFFERENTIAL/PLATELET
Abs Immature Granulocytes: 0.01 10*3/uL (ref 0.00–0.07)
Basophils Absolute: 0.1 10*3/uL (ref 0.0–0.1)
Basophils Relative: 2 %
Eosinophils Absolute: 0.1 10*3/uL (ref 0.0–0.5)
Eosinophils Relative: 2 %
HCT: 33.9 % — ABNORMAL LOW (ref 36.0–46.0)
Hemoglobin: 11.8 g/dL — ABNORMAL LOW (ref 12.0–15.0)
Immature Granulocytes: 0 %
Lymphocytes Relative: 23 %
Lymphs Abs: 0.7 10*3/uL (ref 0.7–4.0)
MCH: 37.5 pg — ABNORMAL HIGH (ref 26.0–34.0)
MCHC: 34.8 g/dL (ref 30.0–36.0)
MCV: 107.6 fL — ABNORMAL HIGH (ref 80.0–100.0)
Monocytes Absolute: 0.4 10*3/uL (ref 0.1–1.0)
Monocytes Relative: 13 %
Neutro Abs: 1.7 10*3/uL (ref 1.7–7.7)
Neutrophils Relative %: 60 %
Platelets: 171 10*3/uL (ref 150–400)
RBC: 3.15 MIL/uL — ABNORMAL LOW (ref 3.87–5.11)
RDW: 14.3 % (ref 11.5–15.5)
WBC: 2.9 10*3/uL — ABNORMAL LOW (ref 4.0–10.5)
nRBC: 0 % (ref 0.0–0.2)

## 2019-03-09 LAB — COMPREHENSIVE METABOLIC PANEL
ALT: 19 U/L (ref 0–44)
AST: 46 U/L — ABNORMAL HIGH (ref 15–41)
Albumin: 3.6 g/dL (ref 3.5–5.0)
Alkaline Phosphatase: 113 U/L (ref 38–126)
Anion gap: 11 (ref 5–15)
BUN: 19 mg/dL (ref 8–23)
CO2: 25 mmol/L (ref 22–32)
Calcium: 9.1 mg/dL (ref 8.9–10.3)
Chloride: 109 mmol/L (ref 98–111)
Creatinine, Ser: 1.22 mg/dL — ABNORMAL HIGH (ref 0.44–1.00)
GFR calc Af Amer: 55 mL/min — ABNORMAL LOW (ref >60)
GFR calc non Af Amer: 47 mL/min — ABNORMAL LOW (ref >60)
Glucose, Bld: 90 mg/dL (ref 70–99)
Potassium: 4 mmol/L (ref 3.5–5.1)
Sodium: 145 mmol/L (ref 135–145)
Total Bilirubin: 0.9 mg/dL (ref 0.3–1.2)
Total Protein: 6.6 g/dL (ref 6.5–8.1)

## 2019-03-09 MED ORDER — FULVESTRANT 250 MG/5ML IM SOLN
500.0000 mg | Freq: Once | INTRAMUSCULAR | Status: AC
  Administered 2019-03-09: 500 mg via INTRAMUSCULAR

## 2019-03-09 MED ORDER — PANTOPRAZOLE SODIUM 40 MG PO TBEC: 40.0000 mg | DELAYED_RELEASE_TABLET | Freq: Every day | ORAL | 4 refills | Status: DC

## 2019-03-09 MED ORDER — FULVESTRANT 250 MG/5ML IM SOLN
INTRAMUSCULAR | Status: AC
  Filled 2019-03-09: qty 10

## 2019-03-09 MED ORDER — PALBOCICLIB 125 MG PO TABS: ORAL_TABLET | ORAL | 6 refills | Status: DC

## 2019-03-09 MED ORDER — DENOSUMAB 120 MG/1.7ML ~~LOC~~ SOLN
120.0000 mg | Freq: Once | SUBCUTANEOUS | Status: AC
  Administered 2019-03-09: 120 mg via SUBCUTANEOUS

## 2019-03-09 MED ORDER — DENOSUMAB 120 MG/1.7ML ~~LOC~~ SOLN
SUBCUTANEOUS | Status: AC
  Filled 2019-03-09: qty 1.7

## 2019-03-09 NOTE — Telephone Encounter (Addendum)
Oral Oncology Pharmacist Encounter  Received refill prescription for Ibrance (palbociclib) for the treatment of metastatic breast cancer ER/PR positive, HER2 negative in conjunction with fulvestrant, planned duration until disease progression or unacceptable drug toxicity.  CMP from 03/09/19 assessed, no relevant lab abnormalities. Prescription dose and frequency assessed.   Current medication list in Epic reviewed, one DDIs with palbociclib identified: -Palbociclib may increase the concentration of amlodipine. Monitor patient for hypotension. No baseline dose adjustment needed.  Prescription has been e-scribed to the [facility] for benefits analysis and approval.  Oral Oncology Clinic will continue to follow for insurance authorization, copayment issues, initial counseling and start date.  Darl Pikes, PharmD, BCPS, [facility] Hematology/Oncology Clinical Pharmacist [facility] [phone]  03/09/2019 3:22 PM

## 2019-03-09 NOTE — Progress Notes (Signed)
Spoke to pt regarding her financial concerns about her treatment.  She gave me consent to apply in her behalf so I enrolledherin the [employer] First Step program forXgeva for $10,000 per calendar yearfrom1/12/21. Pt will pay $0 for herfirstdose or cycleand $69for each subsequent dose or cycle.  I also enrolled her in the [employer] Savings program.  She was approved for $6,000 until 02/25/20.  Pt will pay $0 per dose.

## 2019-03-09 NOTE — Telephone Encounter (Signed)
Oral Oncology Patient Advocate Encounter  Received notification from [facility] of Caitlin Wall that prior authorization for Caitlin Wall is required.  PA submitted on CoverMyMeds Key [id number] Status is pending  Oral Oncology Clinic will continue to follow.  [place] Patient [place] Phone [phone] Fax [phone] 03/09/2019 11:52 AM

## 2019-03-09 NOTE — Telephone Encounter (Signed)
Oral Oncology Patient Advocate Encounter  Prior Authorization for Leslee Home has been approved.    PA# [id number] Effective dates: 03/09/19 through 03/07/20  Patients co-pay is $6500  I was able to obtain a copay card for patient, which makes copay $0.  Oral Oncology Clinic will continue to follow.   [place] Patient [place] Phone [phone] Fax [phone] 03/09/2019 1:53 PM

## 2019-03-09 NOTE — Patient Instructions (Signed)
Denosumab injection What is this medicine? DENOSUMAB (den oh sue mab) slows bone breakdown. Prolia is used to treat osteoporosis in women after menopause and in men, and in people who are taking corticosteroids for 6 months or more. Xgeva is used to treat a high calcium level due to cancer and to prevent bone fractures and other bone problems caused by multiple myeloma or cancer bone metastases. Xgeva is also used to treat giant cell tumor of the bone. This medicine may be used for other purposes; ask your health care provider or pharmacist if you have questions. COMMON BRAND NAME(S): Prolia, XGEVA What should I tell my health care provider before I take this medicine? They need to know if you have any of these conditions:  dental disease  having surgery or tooth extraction  infection  kidney disease  low levels of calcium or Vitamin D in the blood  malnutrition  on hemodialysis  skin conditions or sensitivity  thyroid or parathyroid disease  an unusual reaction to denosumab, other medicines, foods, dyes, or preservatives  pregnant or trying to get pregnant  breast-feeding How should I use this medicine? This medicine is for injection under the skin. It is given by a health care professional in a hospital or clinic setting. A special MedGuide will be given to you before each treatment. Be sure to read this information carefully each time. For Prolia, talk to your pediatrician regarding the use of this medicine in children. Special care may be needed. For Xgeva, talk to your pediatrician regarding the use of this medicine in children. While this drug may be prescribed for children as young as 13 years for selected conditions, precautions do apply. Overdosage: If you think you have taken too much of this medicine contact a poison control center or emergency room at once. NOTE: This medicine is only for you. Do not share this medicine with others. What if I miss a dose? It is  important not to miss your dose. Call your doctor or health care professional if you are unable to keep an appointment. What may interact with this medicine? Do not take this medicine with any of the following medications:  other medicines containing denosumab This medicine may also interact with the following medications:  medicines that lower your chance of fighting infection  steroid medicines like prednisone or cortisone This list may not describe all possible interactions. Give your health care provider a list of all the medicines, herbs, non-prescription drugs, or dietary supplements you use. Also tell them if you smoke, drink alcohol, or use illegal drugs. Some items may interact with your medicine. What should I watch for while using this medicine? Visit your doctor or health care professional for regular checks on your progress. Your doctor or health care professional may order blood tests and other tests to see how you are doing. Call your doctor or health care professional for advice if you get a fever, chills or sore throat, or other symptoms of a cold or flu. Do not treat yourself. This drug may decrease your body's ability to fight infection. Try to avoid being around people who are sick. You should make sure you get enough calcium and vitamin D while you are taking this medicine, unless your doctor tells you not to. Discuss the foods you eat and the vitamins you take with your health care professional. See your dentist regularly. Brush and floss your teeth as directed. Before you have any dental work done, tell your dentist you are   receiving this medicine. Do not become pregnant while taking this medicine or for 5 months after stopping it. Talk with your doctor or health care professional about your birth control options while taking this medicine. Women should inform their doctor if they wish to become pregnant or think they might be pregnant. There is a potential for serious side  effects to an unborn child. Talk to your health care professional or pharmacist for more information. What side effects may I notice from receiving this medicine? Side effects that you should report to your doctor or health care professional as soon as possible:  allergic reactions like skin rash, itching or hives, swelling of the face, lips, or tongue  bone pain  breathing problems  dizziness  jaw pain, especially after dental work  redness, blistering, peeling of the skin  signs and symptoms of infection like fever or chills; cough; sore throat; pain or trouble passing urine  signs of low calcium like fast heartbeat, muscle cramps or muscle pain; pain, tingling, numbness in the hands or feet; seizures  unusual bleeding or bruising  unusually weak or tired Side effects that usually do not require medical attention (report to your doctor or health care professional if they continue or are bothersome):  constipation  diarrhea  headache  joint pain  loss of appetite  muscle pain  runny nose  tiredness  upset stomach This list may not describe all possible side effects. Call your doctor for medical advice about side effects. You may report side effects to FDA at 1-800-FDA-1088. Where should I keep my medicine? This medicine is only given in a clinic, doctor's office, or other health care setting and will not be stored at home. NOTE: This sheet is a summary. It may not cover all possible information. If you have questions about this medicine, talk to your doctor, pharmacist, or health care provider.  2020 Elsevier/Gold Standard (2017-06-20 16:10:44)  Fulvestrant injection What is this medicine? FULVESTRANT (ful VES trant) blocks the effects of estrogen. It is used to treat breast cancer. This medicine may be used for other purposes; ask your health care provider or pharmacist if you have questions. COMMON BRAND NAME(S): FASLODEX What should I tell my health care  provider before I take this medicine? They need to know if you have any of these conditions:  bleeding disorders  liver disease  low blood counts, like low white cell, platelet, or red cell counts  an unusual or allergic reaction to fulvestrant, other medicines, foods, dyes, or preservatives  pregnant or trying to get pregnant  breast-feeding How should I use this medicine? This medicine is for injection into a muscle. It is usually given by a health care professional in a hospital or clinic setting. Talk to your pediatrician regarding the use of this medicine in children. Special care may be needed. Overdosage: If you think you have taken too much of this medicine contact a poison control center or emergency room at once. NOTE: This medicine is only for you. Do not share this medicine with others. What if I miss a dose? It is important not to miss your dose. Call your doctor or health care professional if you are unable to keep an appointment. What may interact with this medicine?  medicines that treat or prevent blood clots like warfarin, enoxaparin, dalteparin, apixaban, dabigatran, and rivaroxaban This list may not describe all possible interactions. Give your health care provider a list of all the medicines, herbs, non-prescription drugs, or dietary supplements you  use. Also tell them if you smoke, drink alcohol, or use illegal drugs. Some items may interact with your medicine. What should I watch for while using this medicine? Your condition will be monitored carefully while you are receiving this medicine. You will need important blood work done while you are taking this medicine. Do not become pregnant while taking this medicine or for at least 1 year after stopping it. Women of child-bearing potential will need to have a negative pregnancy test before starting this medicine. Women should inform their doctor if they wish to become pregnant or think they might be pregnant. There is  a potential for serious side effects to an unborn child. Men should inform their doctors if they wish to father a child. This medicine may lower sperm counts. Talk to your health care professional or pharmacist for more information. Do not breast-feed an infant while taking this medicine or for 1 year after the last dose. What side effects may I notice from receiving this medicine? Side effects that you should report to your doctor or health care professional as soon as possible:  allergic reactions like skin rash, itching or hives, swelling of the face, lips, or tongue  feeling faint or lightheaded, falls  pain, tingling, numbness, or weakness in the legs  signs and symptoms of infection like fever or chills; cough; flu-like symptoms; sore throat  vaginal bleeding Side effects that usually do not require medical attention (report to your doctor or health care professional if they continue or are bothersome):  aches, pains  constipation  diarrhea  headache  hot flashes  nausea, vomiting  pain at site where injected  stomach pain This list may not describe all possible side effects. Call your doctor for medical advice about side effects. You may report side effects to FDA at 1-800-FDA-1088. Where should I keep my medicine? This drug is given in a hospital or clinic and will not be stored at home. NOTE: This sheet is a summary. It may not cover all possible information. If you have questions about this medicine, talk to your doctor, pharmacist, or health care provider.  2020 Elsevier/Gold Standard (2017-05-22 11:34:41) Coronavirus (COVID-19) Are you at risk?  Are you at risk for the Coronavirus (COVID-19)?  To be considered HIGH RISK for Coronavirus (COVID-19), you have to meet the following criteria:  . Traveled to [place], [place], [place], [place] or [place]; or in the [place] to [place], [place], [place], or [place]; and have fever, cough, and shortness of  breath within the last 2 weeks of travel OR . Been in close contact with a person diagnosed with COVID-19 within the last 2 weeks and have fever, cough, and shortness of breath . IF YOU DO NOT MEET THESE CRITERIA, YOU ARE CONSIDERED LOW RISK FOR COVID-19.  What to do if you are HIGH RISK for COVID-19?  Marland Kitchen If you are having a medical emergency, call 911. . Seek medical care right away. Before you go to a doctor's office, urgent care or emergency department, call ahead and tell them about your recent travel, contact with someone diagnosed with COVID-19, and your symptoms. You should receive instructions from your physician's office regarding next steps of care.  . When you arrive at healthcare provider, tell the healthcare staff immediately you have returned from visiting [place], [place], [place], [place] or [place]; or traveled in the [place] to [place], [place], [place], or [place]; in the last two weeks or you  have been in close contact with a person diagnosed with COVID-19 in the last 2 weeks.   . Tell the health care staff about your symptoms: fever, cough and shortness of breath. . After you have been seen by a medical provider, you will be either: o Tested for (COVID-19) and discharged home on quarantine except to seek medical care if symptoms worsen, and asked to  - Stay home and avoid contact with others until you get your results (4-5 days)  - Avoid travel on public transportation if possible (such as bus, train, or airplane) or o Sent to the Emergency Department by EMS for evaluation, COVID-19 testing, and possible admission depending on your condition and test results.  What to do if you are LOW RISK for COVID-19?  Reduce your risk of any infection by using the same precautions used for avoiding the common cold or flu:  Marland Kitchen Wash your hands often with soap and warm water for at least 20 seconds.  If soap and water are not readily available, use an alcohol-based hand sanitizer  with at least 60% alcohol.  . If coughing or sneezing, cover your mouth and nose by coughing or sneezing into the elbow areas of your shirt or coat, into a tissue or into your sleeve (not your hands). . Avoid shaking hands with others and consider head nods or verbal greetings only. . Avoid touching your eyes, nose, or mouth with unwashed hands.  . Avoid close contact with people who are sick. . Avoid places or events with large numbers of people in one location, like concerts or sporting events. . Carefully consider travel plans you have or are making. . If you are planning any travel outside or inside the [place], visit the CDC's Travelers' Health webpage for the latest health notices. . If you have some symptoms but not all symptoms, continue to monitor at home and seek medical attention if your symptoms worsen. . If you are having a medical emergency, call 911.   [place] / e-Visit: [email or web address]         [facility]: [facility]: [phone]                   [facility]: [phone]

## 2019-03-11 ENCOUNTER — Other Ambulatory Visit: Payer: Self-pay | Admitting: *Deleted

## 2019-03-12 MED FILL — IBRANCE 125 MG TABS: 125 | 28 days supply | Qty: 21 | Fill #0

## 2019-03-12 NOTE — Telephone Encounter (Signed)
Oral Chemotherapy Pharmacist Encounter  Patient switching to [place] to fill her Ibrance.  Patient Education I spoke with patient for overview of new oral chemotherapy medication: Ibrance (palbociclib) for the treatment of metastatic breast cancer ER/PR positive, HER2 negative in conjunction with fulvestrant, planned duration until disease progression or unacceptable drug toxicity.   Counseled patient on administration, dosing, side effects, monitoring, drug-food interactions, safe handling, storage, and disposal. Patient will take 1 TABLET BY MOUTH ONCE DAILY WITH OR WITHOUT FOOD FOR 21 DAYS, FOLLOWED BY 7 DAYS OFF, REPEATED EVERY 28 DAYS.  Side effects include but not limited to: N/V, decreased wbc, fatigue, hair loss.    Reviewed with patient importance of keeping a medication schedule and plan for any missed doses.  Caitlin Wall voiced understanding and appreciation. All questions answered.  Provided patient with [facility] phone number. Patient knows to call the office with questions or concerns. Oral Chemotherapy [facility] will continue to follow.  Darl Pikes, PharmD, BCPS, [facility] Hematology/Oncology Clinical Pharmacist [facility] [phone]  03/12/2019 1:56 PM

## 2019-03-17 NOTE — Telephone Encounter (Signed)
No entry 

## 2019-04-05 ENCOUNTER — Other Ambulatory Visit: Payer: 59

## 2019-04-05 ENCOUNTER — Inpatient Hospital Stay: Payer: BC Managed Care – PPO

## 2019-04-05 ENCOUNTER — Ambulatory Visit: Payer: 59

## 2019-04-05 ENCOUNTER — Other Ambulatory Visit: Payer: Self-pay

## 2019-04-05 ENCOUNTER — Inpatient Hospital Stay: Payer: BC Managed Care – PPO | Attending: Oncology

## 2019-04-05 VITALS — BP 118/62 | HR 78 | Temp 98.2°F | Resp 18

## 2019-04-05 DIAGNOSIS — I824Z2 Acute embolism and thrombosis of unspecified deep veins of left distal lower extremity: Secondary | ICD-10-CM

## 2019-04-05 DIAGNOSIS — C7951 Secondary malignant neoplasm of bone: Secondary | ICD-10-CM | POA: Insufficient documentation

## 2019-04-05 DIAGNOSIS — I129 Hypertensive chronic kidney disease with stage 1 through stage 4 chronic kidney disease, or unspecified chronic kidney disease: Secondary | ICD-10-CM | POA: Insufficient documentation

## 2019-04-05 DIAGNOSIS — Z803 Family history of malignant neoplasm of breast: Secondary | ICD-10-CM | POA: Diagnosis not present

## 2019-04-05 DIAGNOSIS — Z801 Family history of malignant neoplasm of trachea, bronchus and lung: Secondary | ICD-10-CM | POA: Diagnosis not present

## 2019-04-05 DIAGNOSIS — K219 Gastro-esophageal reflux disease without esophagitis: Secondary | ICD-10-CM | POA: Insufficient documentation

## 2019-04-05 DIAGNOSIS — Z79899 Other long term (current) drug therapy: Secondary | ICD-10-CM | POA: Diagnosis not present

## 2019-04-05 DIAGNOSIS — Z170 Estrogen receptor positive status [ER+]: Secondary | ICD-10-CM | POA: Diagnosis not present

## 2019-04-05 DIAGNOSIS — C50512 Malignant neoplasm of lower-outer quadrant of left female breast: Secondary | ICD-10-CM | POA: Diagnosis present

## 2019-04-05 DIAGNOSIS — Z5111 Encounter for antineoplastic chemotherapy: Secondary | ICD-10-CM | POA: Insufficient documentation

## 2019-04-05 DIAGNOSIS — Z20822 Contact with and (suspected) exposure to covid-19: Secondary | ICD-10-CM | POA: Diagnosis not present

## 2019-04-05 DIAGNOSIS — E785 Hyperlipidemia, unspecified: Secondary | ICD-10-CM | POA: Insufficient documentation

## 2019-04-05 DIAGNOSIS — N183 Chronic kidney disease, stage 3 unspecified: Secondary | ICD-10-CM | POA: Insufficient documentation

## 2019-04-05 DIAGNOSIS — G893 Neoplasm related pain (acute) (chronic): Secondary | ICD-10-CM

## 2019-04-05 LAB — COMPREHENSIVE METABOLIC PANEL
ALT: 31 U/L (ref 0–44)
AST: 74 U/L — ABNORMAL HIGH (ref 15–41)
Albumin: 3.6 g/dL (ref 3.5–5.0)
Alkaline Phosphatase: 135 U/L — ABNORMAL HIGH (ref 38–126)
Anion gap: 7 (ref 5–15)
BUN: 18 mg/dL (ref 8–23)
CO2: 29 mmol/L (ref 22–32)
Calcium: 9.4 mg/dL (ref 8.9–10.3)
Chloride: 109 mmol/L (ref 98–111)
Creatinine, Ser: 1.22 mg/dL — ABNORMAL HIGH (ref 0.44–1.00)
GFR calc Af Amer: 55 mL/min — ABNORMAL LOW (ref >60)
GFR calc non Af Amer: 47 mL/min — ABNORMAL LOW (ref >60)
Glucose, Bld: 93 mg/dL (ref 70–99)
Potassium: 4.2 mmol/L (ref 3.5–5.1)
Sodium: 145 mmol/L (ref 135–145)
Total Bilirubin: 0.9 mg/dL (ref 0.3–1.2)
Total Protein: 6.8 g/dL (ref 6.5–8.1)

## 2019-04-05 LAB — CBC WITH DIFFERENTIAL/PLATELET
Abs Immature Granulocytes: 0 10*3/uL (ref 0.00–0.07)
Basophils Absolute: 0.1 10*3/uL (ref 0.0–0.1)
Basophils Relative: 2 %
Eosinophils Absolute: 0.1 10*3/uL (ref 0.0–0.5)
Eosinophils Relative: 3 %
HCT: 35.3 % — ABNORMAL LOW (ref 36.0–46.0)
Hemoglobin: 12.2 g/dL (ref 12.0–15.0)
Immature Granulocytes: 0 %
Lymphocytes Relative: 20 %
Lymphs Abs: 0.6 10*3/uL — ABNORMAL LOW (ref 0.7–4.0)
MCH: 37.3 pg — ABNORMAL HIGH (ref 26.0–34.0)
MCHC: 34.6 g/dL (ref 30.0–36.0)
MCV: 108 fL — ABNORMAL HIGH (ref 80.0–100.0)
Monocytes Absolute: 0.4 10*3/uL (ref 0.1–1.0)
Monocytes Relative: 12 %
Neutro Abs: 1.9 10*3/uL (ref 1.7–7.7)
Neutrophils Relative %: 63 %
Platelets: 163 10*3/uL (ref 150–400)
RBC: 3.27 MIL/uL — ABNORMAL LOW (ref 3.87–5.11)
RDW: 13.9 % (ref 11.5–15.5)
WBC: 2.9 10*3/uL — ABNORMAL LOW (ref 4.0–10.5)
nRBC: 0 % (ref 0.0–0.2)

## 2019-04-05 MED ORDER — FULVESTRANT 250 MG/5ML IM SOLN
500.0000 mg | Freq: Once | INTRAMUSCULAR | Status: AC
  Administered 2019-04-05: 500 mg via INTRAMUSCULAR

## 2019-04-05 MED ORDER — DENOSUMAB 120 MG/1.7ML ~~LOC~~ SOLN
SUBCUTANEOUS | Status: AC
  Filled 2019-04-05: qty 1.7

## 2019-04-05 MED ORDER — DENOSUMAB 120 MG/1.7ML ~~LOC~~ SOLN
120.0000 mg | Freq: Once | SUBCUTANEOUS | Status: AC
  Administered 2019-04-05: 10:00:00 120 mg via SUBCUTANEOUS

## 2019-04-05 MED ORDER — FULVESTRANT 250 MG/5ML IM SOLN
INTRAMUSCULAR | Status: AC
  Filled 2019-04-05: qty 10

## 2019-04-05 NOTE — Patient Instructions (Signed)
Denosumab injection What is this medicine? DENOSUMAB (den oh sue mab) slows bone breakdown. Prolia is used to treat osteoporosis in women after menopause and in men, and in people who are taking corticosteroids for 6 months or more. Xgeva is used to treat a high calcium level due to cancer and to prevent bone fractures and other bone problems caused by multiple myeloma or cancer bone metastases. Xgeva is also used to treat giant cell tumor of the bone. This medicine may be used for other purposes; ask your health care provider or pharmacist if you have questions. COMMON BRAND NAME(S): Prolia, XGEVA What should I tell my health care provider before I take this medicine? They need to know if you have any of these conditions:  dental disease  having surgery or tooth extraction  infection  kidney disease  low levels of calcium or Vitamin D in the blood  malnutrition  on hemodialysis  skin conditions or sensitivity  thyroid or parathyroid disease  an unusual reaction to denosumab, other medicines, foods, dyes, or preservatives  pregnant or trying to get pregnant  breast-feeding How should I use this medicine? This medicine is for injection under the skin. It is given by a health care professional in a hospital or clinic setting. A special MedGuide will be given to you before each treatment. Be sure to read this information carefully each time. For Prolia, talk to your pediatrician regarding the use of this medicine in children. Special care may be needed. For Xgeva, talk to your pediatrician regarding the use of this medicine in children. While this drug may be prescribed for children as young as 13 years for selected conditions, precautions do apply. Overdosage: If you think you have taken too much of this medicine contact a poison control center or emergency room at once. NOTE: This medicine is only for you. Do not share this medicine with others. What if I miss a dose? It is  important not to miss your dose. Call your doctor or health care professional if you are unable to keep an appointment. What may interact with this medicine? Do not take this medicine with any of the following medications:  other medicines containing denosumab This medicine may also interact with the following medications:  medicines that lower your chance of fighting infection  steroid medicines like prednisone or cortisone This list may not describe all possible interactions. Give your health care provider a list of all the medicines, herbs, non-prescription drugs, or dietary supplements you use. Also tell them if you smoke, drink alcohol, or use illegal drugs. Some items may interact with your medicine. What should I watch for while using this medicine? Visit your doctor or health care professional for regular checks on your progress. Your doctor or health care professional may order blood tests and other tests to see how you are doing. Call your doctor or health care professional for advice if you get a fever, chills or sore throat, or other symptoms of a cold or flu. Do not treat yourself. This drug may decrease your body's ability to fight infection. Try to avoid being around people who are sick. You should make sure you get enough calcium and vitamin D while you are taking this medicine, unless your doctor tells you not to. Discuss the foods you eat and the vitamins you take with your health care professional. See your dentist regularly. Brush and floss your teeth as directed. Before you have any dental work done, tell your dentist you are   receiving this medicine. Do not become pregnant while taking this medicine or for 5 months after stopping it. Talk with your doctor or health care professional about your birth control options while taking this medicine. Women should inform their doctor if they wish to become pregnant or think they might be pregnant. There is a potential for serious side  effects to an unborn child. Talk to your health care professional or pharmacist for more information. What side effects may I notice from receiving this medicine? Side effects that you should report to your doctor or health care professional as soon as possible:  allergic reactions like skin rash, itching or hives, swelling of the face, lips, or tongue  bone pain  breathing problems  dizziness  jaw pain, especially after dental work  redness, blistering, peeling of the skin  signs and symptoms of infection like fever or chills; cough; sore throat; pain or trouble passing urine  signs of low calcium like fast heartbeat, muscle cramps or muscle pain; pain, tingling, numbness in the hands or feet; seizures  unusual bleeding or bruising  unusually weak or tired Side effects that usually do not require medical attention (report to your doctor or health care professional if they continue or are bothersome):  constipation  diarrhea  headache  joint pain  loss of appetite  muscle pain  runny nose  tiredness  upset stomach This list may not describe all possible side effects. Call your doctor for medical advice about side effects. You may report side effects to FDA at 1-800-FDA-1088. Where should I keep my medicine? This medicine is only given in a clinic, doctor's office, or other health care setting and will not be stored at home. NOTE: This sheet is a summary. It may not cover all possible information. If you have questions about this medicine, talk to your doctor, pharmacist, or health care provider.  2020 Elsevier/Gold Standard (2017-06-20 16:10:44)  Fulvestrant injection What is this medicine? FULVESTRANT (ful VES trant) blocks the effects of estrogen. It is used to treat breast cancer. This medicine may be used for other purposes; ask your health care provider or pharmacist if you have questions. COMMON BRAND NAME(S): FASLODEX What should I tell my health care  provider before I take this medicine? They need to know if you have any of these conditions:  bleeding disorders  liver disease  low blood counts, like low white cell, platelet, or red cell counts  an unusual or allergic reaction to fulvestrant, other medicines, foods, dyes, or preservatives  pregnant or trying to get pregnant  breast-feeding How should I use this medicine? This medicine is for injection into a muscle. It is usually given by a health care professional in a hospital or clinic setting. Talk to your pediatrician regarding the use of this medicine in children. Special care may be needed. Overdosage: If you think you have taken too much of this medicine contact a poison control center or emergency room at once. NOTE: This medicine is only for you. Do not share this medicine with others. What if I miss a dose? It is important not to miss your dose. Call your doctor or health care professional if you are unable to keep an appointment. What may interact with this medicine?  medicines that treat or prevent blood clots like warfarin, enoxaparin, dalteparin, apixaban, dabigatran, and rivaroxaban This list may not describe all possible interactions. Give your health care provider a list of all the medicines, herbs, non-prescription drugs, or dietary supplements you  use. Also tell them if you smoke, drink alcohol, or use illegal drugs. Some items may interact with your medicine. What should I watch for while using this medicine? Your condition will be monitored carefully while you are receiving this medicine. You will need important blood work done while you are taking this medicine. Do not become pregnant while taking this medicine or for at least 1 year after stopping it. Women of child-bearing potential will need to have a negative pregnancy test before starting this medicine. Women should inform their doctor if they wish to become pregnant or think they might be pregnant. There is  a potential for serious side effects to an unborn child. Men should inform their doctors if they wish to father a child. This medicine may lower sperm counts. Talk to your health care professional or pharmacist for more information. Do not breast-feed an infant while taking this medicine or for 1 year after the last dose. What side effects may I notice from receiving this medicine? Side effects that you should report to your doctor or health care professional as soon as possible:  allergic reactions like skin rash, itching or hives, swelling of the face, lips, or tongue  feeling faint or lightheaded, falls  pain, tingling, numbness, or weakness in the legs  signs and symptoms of infection like fever or chills; cough; flu-like symptoms; sore throat  vaginal bleeding Side effects that usually do not require medical attention (report to your doctor or health care professional if they continue or are bothersome):  aches, pains  constipation  diarrhea  headache  hot flashes  nausea, vomiting  pain at site where injected  stomach pain This list may not describe all possible side effects. Call your doctor for medical advice about side effects. You may report side effects to FDA at 1-800-FDA-1088. Where should I keep my medicine? This drug is given in a hospital or clinic and will not be stored at home. NOTE: This sheet is a summary. It may not cover all possible information. If you have questions about this medicine, talk to your doctor, pharmacist, or health care provider.  2020 Elsevier/Gold Standard (2017-05-22 11:34:41) Coronavirus (COVID-19) Are you at risk?  Are you at risk for the Coronavirus (COVID-19)?  To be considered HIGH RISK for Coronavirus (COVID-19), you have to meet the following criteria:  . Traveled to [place], [place], [place], [place] or [place]; or in the [place] to [place], [place], [place], or [place]; and have fever, cough, and shortness of  breath within the last 2 weeks of travel OR . Been in close contact with a person diagnosed with COVID-19 within the last 2 weeks and have fever, cough, and shortness of breath . IF YOU DO NOT MEET THESE CRITERIA, YOU ARE CONSIDERED LOW RISK FOR COVID-19.  What to do if you are HIGH RISK for COVID-19?  Marland Kitchen If you are having a medical emergency, call 911. . Seek medical care right away. Before you go to a doctor's office, urgent care or emergency department, call ahead and tell them about your recent travel, contact with someone diagnosed with COVID-19, and your symptoms. You should receive instructions from your physician's office regarding next steps of care.  . When you arrive at healthcare provider, tell the healthcare staff immediately you have returned from visiting [place], [place], [place], [place] or [place]; or traveled in the [place] to [place], [place], [place], or [place]; in the last two weeks or you  have been in close contact with a person diagnosed with COVID-19 in the last 2 weeks.   . Tell the health care staff about your symptoms: fever, cough and shortness of breath. . After you have been seen by a medical provider, you will be either: o Tested for (COVID-19) and discharged home on quarantine except to seek medical care if symptoms worsen, and asked to  - Stay home and avoid contact with others until you get your results (4-5 days)  - Avoid travel on public transportation if possible (such as bus, train, or airplane) or o Sent to the Emergency Department by EMS for evaluation, COVID-19 testing, and possible admission depending on your condition and test results.  What to do if you are LOW RISK for COVID-19?  Reduce your risk of any infection by using the same precautions used for avoiding the common cold or flu:  . Wash your hands often with soap and warm water for at least 20 seconds.  If soap and water are not readily available, use an alcohol-based hand sanitizer  with at least 60% alcohol.  . If coughing or sneezing, cover your mouth and nose by coughing or sneezing into the elbow areas of your shirt or coat, into a tissue or into your sleeve (not your hands). . Avoid shaking hands with others and consider head nods or verbal greetings only. . Avoid touching your eyes, nose, or mouth with unwashed hands.  . Avoid close contact with people who are sick. . Avoid places or events with large numbers of people in one location, like concerts or sporting events. . Carefully consider travel plans you have or are making. . If you are planning any travel outside or inside the [place], visit the CDC's Travelers' Health webpage for the latest health notices. . If you have some symptoms but not all symptoms, continue to monitor at home and seek medical attention if your symptoms worsen. . If you are having a medical emergency, call 911.   ADDITIONAL HEALTHCARE OPTIONS FOR PATIENTS  [facility] / e-Visit: [email or web address]         [facility]: [phone]  [facility]: [phone]                   [facility]: [phone]  

## 2019-04-10 ENCOUNTER — Other Ambulatory Visit: Payer: Self-pay | Admitting: Internal Medicine

## 2019-04-10 NOTE — Telephone Encounter (Signed)
Please refill as per office routine med refill policy (all routine meds refilled for 3 mo or monthly per pt preference up to one year from last visit, then month to month grace period for 3 mo, then further med refills will have to be denied)  

## 2019-04-12 ENCOUNTER — Telehealth: Payer: Self-pay | Admitting: *Deleted

## 2019-04-12 MED ORDER — AZITHROMYCIN 250 MG PO TABS: ORAL_TABLET | ORAL | 0 refills | Status: DC

## 2019-04-12 MED ORDER — BENZONATATE 100 MG PO CAPS: 100.0000 mg | ORAL_CAPSULE | Freq: Three times a day (TID) | ORAL | 1 refills | Status: DC | PRN

## 2019-04-12 MED ORDER — IPRATROPIUM-ALBUTEROL 20-100 MCG/ACT IN AERS: 1.0000 | INHALATION_SPRAY | Freq: Four times a day (QID) | RESPIRATORY_TRACT | 3 refills | Status: DC

## 2019-04-12 MED FILL — IBRANCE 125 MG TABS: 125 | 28 days supply | Qty: 21 | Fill #1

## 2019-04-12 NOTE — Telephone Encounter (Signed)
This RN spoke with pt per her call stating onset of bronchitis- ( like last year ).  She states she has been using muccinex but not with good success.  She states congestion is in her chest - and feels tight ( like last year ).  She denies any fevers or  increased SOB over her baseline.  She states she can taste and smell as normal.  She denies having exposure to Covid.  She has not been able to obtain symbicort as ordered by her primary due to cost issues.  Of note she is on her off week for [place].  Per MD review - obtained prescriptions for albuterol ( she can afford it ) Z pak and tessalon perles as used last year with good outcome.  Pharmacy verified with pt - prescriptions sent.

## 2019-04-19 ENCOUNTER — Other Ambulatory Visit: Payer: Self-pay | Admitting: Oncology

## 2019-04-19 DIAGNOSIS — C7951 Secondary malignant neoplasm of bone: Secondary | ICD-10-CM

## 2019-04-19 DIAGNOSIS — C50512 Malignant neoplasm of lower-outer quadrant of left female breast: Secondary | ICD-10-CM

## 2019-04-19 DIAGNOSIS — G893 Neoplasm related pain (acute) (chronic): Secondary | ICD-10-CM

## 2019-04-19 MED ORDER — MORPHINE SULFATE ER 15 MG PO TBCR: 15.0000 mg | EXTENDED_RELEASE_TABLET | Freq: Two times a day (BID) | ORAL | 0 refills | Status: DC

## 2019-04-19 MED ORDER — VENLAFAXINE HCL ER 37.5 MG PO CP24: 37.5000 mg | ORAL_CAPSULE | Freq: Every day | ORAL | 1 refills | Status: DC

## 2019-04-19 NOTE — Progress Notes (Signed)
Caitlin Wall called today complaining of pain.  She has been taking the Aleve and Tylenol 3 times a day she says and morphine 5 mg 4 times a day without relief.  She says all the morphine doses put her to sleep.  It does not really help the pain.  I am starting her on MS Contin which we discussed over the phone so she understands the goals of care.  I am also restaging her with a bone scan and CT of the chest.  I am going to obtain some tumor markers to see if that gives [place] a clue as to how best to follow this patient with bone only disease.  I am also writing for venlafaxine low-dose.  We will consider referral to the palliative care multidisciplinary clinic depending on the visit on Friday.

## 2019-04-22 NOTE — Progress Notes (Signed)
[place]  [phone] Fax:([phone]) [phone]    ID: Caitlin Wall DOB: [birth year]  MR#: [id number]  LXB#:[id number]  Patient Care Team: Biagio Borg, MD as PCP - General Tamala Julian Lynnell Dike, MD as PCP - Cardiology (Cardiology) Keyondre Hepburn, Virgie Dad, MD as Consulting Physician (Oncology) Kyung Rudd, MD as Consulting Physician (Radiation Oncology) Jovita Kussmaul, MD as Consulting Physician (General Surgery) Sylvan Cheese, NP as Nurse Practitioner (Hematology and Oncology) Tanda Rockers, MD as Consulting Physician (Pulmonary Disease) Newton Pigg, MD as Consulting Physician (Obstetrics and Gynecology) OTHER MD: Keturah Barre MD   CHIEF COMPLAINT: Metastatic estrogen receptor positive breast cancer  CURRENT TREATMENT: Denosumab/Xgeva, fluvestrant, palbociclib   INTERVAL HISTORY: Caitlin Wall returns today for follow-up and treatment of her metastatic estrogen receptor positive breast cancer.  She called to complain of worsening pain and continuing shortness of breath problems.  She continues on denosumab/Xgeva.  She has been enrolled in the [employer] first step program and will pay between $0 and $5 for each dose.  She receives this every 28 days.  She has been on this for a year with no side effects.  She sees her dentist every 6 months.  There have been no extractions and no symptoms of osteonecrosis.  She also continues on fulvestrant, with her most recent dose received on 04/05/2019.  She is on the [employer] savings program and has been approved for $6000 until December of this year.  She will have no co-pay for this drug.  She has no side effects from this that she is aware of  She continues on palbociclib/Ibrance still of the initial dose of 125 mg daily 21 days on 7 days off.  This has been authorized and the patient has a co-pay card which makes her co-pay $0.  She receives this through the [employer].  Today  she did not complain of fatigue or nausea with this.  Ladiamond is scheduled for bone scan and chest CT on 04/30/2019.   REVIEW OF SYSTEMS: Jodell called 04/12/2019 to complain of bronchitis symptoms not relieved by Mucinex.  She was started on albuterol and a Z-Pak.  She says things got a little bit better but she still very short of breath.  She has a minimal cough.  There has been no phlegm production.  She has not had any fever.  She is not aware of any COVID-19 exposures.  When she takes a deep breath she has pain in both sides of the rib cage.  She also has pain and the right hip area. The patient's pain is being treated with MS Contin 15 mg twice daily and morphine liquid 10 mg and 5 mL she also takes Advil as needed.  She tells me that she is taking the Advil and Tylenol 3 times a day.  She has only been taking the MS Contin in the morning.  She has been using very little of the quick acting morphine.  She is not afraid of becoming an addict she says but she is afraid of not being able to work.  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  She also is not taking the bowel prophylaxis medications as instructed.  She thinks stool softeners may have been causing her reflux.  She is not on MiraLAX.  She is having some hard bowel movements.  BREAST CANCER HISTORY:  From the original intake note:   [name] [facility]. She has subpectoral saline implants in place. There was a possible asymmetry in the left breast and she was recalled for left diagnostic mammography with tomosynthesis and left breast ultrasonography 11/03/2014. The breast density was category C. In the left breast lower outer quadrant there was an irregular mass which was not palpable and which by ultrasonography measured 0.6 cm. The left axilla was sonographically benign. Biopsy of this mass 11/09/2014 showed ([id number]) fibroadipose adipose tissue. This was felt to be  discordant.  Accordingly the patient was referred to surgery and she underwent left breast radioactive seed localized lumpectomy 12/28/2014. The pathology from that procedure (([id number]) showed ductal carcinoma in situ, high-grade, measuring 0.2 cm. This was less than 0.1 cm from the posterior margin. The cells was estrogen receptor positive at 95%, with strong staining intensity, progesterone receptor positive at 30% with moderate staining intensity.  Her subsequent history is as detailed below.   PAST MEDICAL HISTORY: Past Medical History:  Diagnosis Date   Acute asthma 07/27/2012   Breast cancer ([facility]) 12/28/14   Left BresastDCIS   COLONIC POLYPS, HX OF 11/04/2007   DVT (deep venous thrombosis) ([facility]) 2018   left leg   GERD 11/04/2007   GERD (gastroesophageal reflux disease)    HAIR LOSS 11/04/2007   Hearing loss    bilateral - wears hearing aids   Hx of cardiac cath 11/15/2016   a. LHC 11/15/16 showed normal coronaries and EF 50-55% with normal EDP. (done for false positive NST)   Hx of colonic polyps 1995   adenomatous   Hyperlipidemia    HYPERLIPIDEMIA 11/04/2007   Hypertension    HYPERTENSION 06/07/2009   LBBB (left bundle branch block) 07/26/2011   no current problems   Personal history of radiation therapy    S/P radiation therapy 02/02/15-03/24/15   left breast 60.4GY    PAST SURGICAL HISTORY: Past Surgical History:  Procedure Laterality Date   ABDOMINAL HYSTERECTOMY     partial   ANTERIOR FUSION CERVICAL SPINE     bilat ear surgury/hearing loss     wears hearing aids   BREAST LUMPECTOMY Left 2016   BREAST LUMPECTOMY WITH RADIOACTIVE SEED LOCALIZATION Left 12/28/2014   Procedure: BREAST LUMPECTOMY WITH RADIOACTIVE SEED LOCALIZATION;  Surgeon: Autumn Messing III, MD;  Location: [facility];  Service: General;  Laterality: Left;   COLONOSCOPY     LEFT HEART CATH AND CORONARY ANGIOGRAPHY N/A 11/15/2016   Procedure: LEFT HEART CATH AND CORONARY  ANGIOGRAPHY;  Surgeon: Belva Crome, MD;  Location: [facility];  Service: Cardiovascular;  Laterality: N/A;   POLYPECTOMY     UPPER GASTROINTESTINAL ENDOSCOPY     VULVECTOMY PARTIAL N/A 02/02/2019   Procedure: VULVECTOMY PARTIAL;  Surgeon: Newton Pigg, MD;  Location: [facility];  Service: Gynecology;  Laterality: N/A;   WISDOM TOOTH EXTRACTION      FAMILY HISTORY: Family History  Problem Relation Age of Onset   Breast cancer Other    Cancer Paternal Grandmother        breast   Lung cancer Father    Breast cancer Maternal Grandmother    Healthy Mother    Coronary artery disease Neg Hx    Colon cancer Neg Hx    Pancreatic cancer Neg Hx    Rectal cancer Neg Hx    Stomach cancer Neg Hx    Esophageal cancer Neg Hx    [name] died from lung cancer in the setting of tobacco abuse at  age 53. [name] died from "old age" at age 14. The patient had one brother, 3 sisters. The brother had" I cancer" requiring enucleation--the patient does not know whether this was a primary ocular melanoma. [name] was diagnosed with breast cancer but the patient does not know at what age. There is no other history of breast or ovarian cancer in the family   GYNECOLOGIC HISTORY:  No LMP recorded (lmp unknown). Patient has had a hysterectomy. Menarche age 29, first live birth age 74. The patient is GX P3. She is status post hysterectomy without salpingo-oophorectomy. She did not take hormone replacement. She did use oral contraceptives remotely without any complications.   SOCIAL HISTORY:  Keiva works in accounts payable. Her husband [name] is disabled because of back problems. [name] is a Marine scientist working in rehabilitation in [place]. [name] lives in [place] where he works as a Games developer. [name] lives in [place] where he works as a Emergency planning/management officer. The patient has 4 grandchildren. She  is not a Ambulance person.    ADVANCED DIRECTIVES: Not in place   HEALTH MAINTENANCE: Social History   Tobacco Use   Smoking status: Former Smoker    Packs/day: 1.00    Years: 20.00    Pack years: 20.00    Types: Cigarettes    Quit date: 08/19/1989    Years since quitting: 29.6   Smokeless tobacco: Never Used  Substance Use Topics   Alcohol use: Yes    Comment: rare   Drug use: No    Colonoscopy: 10/2018, repeat 2027 (Dr. Fuller Plan)  PAP: s/p hysterectomy  Bone density: 02/2018, -1.7  Allergies  Allergen Reactions   Codeine     REACTION: nausea,   Tramadol Hcl Nausea Only    Current Outpatient Medications  Medication Sig Dispense Refill   acetaminophen (TYLENOL) 500 MG tablet Take 1 tablet (500 mg total) by mouth every 8 (eight) hours as needed. Take together with ibuprofen 200 mg tablet 30 tablet 0   amLODipine (NORVASC) 2.5 MG tablet Take 2.5 mg by mouth daily.     aspirin EC 81 MG tablet Take 81 mg by mouth daily.     benzonatate (TESSALON) 100 MG capsule Take 1 capsule (100 mg total) by mouth 3 (three) times daily as needed for cough. 30 capsule 1   budesonide-formoterol (SYMBICORT) 160-4.5 MCG/ACT inhaler Inhale 2 puffs into the lungs 2 (two) times daily. 1 Inhaler 3   cholecalciferol (VITAMIN D3) 25 MCG (1000 UT) tablet Take 1 tablet (1,000 Units total) by mouth daily.     ibuprofen (ADVIL) 200 MG tablet Take 200 mg by mouth every 6 (six) hours as needed.     Ipratropium-Albuterol (COMBIVENT) 20-100 MCG/ACT AERS respimat Inhale 1 puff into the lungs every 6 (six) hours. 4 g 3   losartan (COZAAR) 100 MG tablet Take 1 tablet by mouth once daily 30 tablet 0   morphine (MS CONTIN) 15 MG 12 hr tablet Take 1 tablet (15 mg total) by mouth every 12 (twelve) hours. 60 tablet 0   morphine 10 MG/5ML solution Take 1.3-2.5 mLs (2.6-5 mg total) by mouth every 6 (six) hours as needed for severe pain. 30 mL 0   palbociclib (IBRANCE) 125 MG tablet TAKE 1 TABLET BY MOUTH  ONCE DAILY WITH OR WITHOUT FOOD  FOR 21 DAYS, FOLLOWED BY 7  DAYS OFF, REPEATED EVERY 28 DAYS 21 tablet 6   pantoprazole (PROTONIX) 40 MG tablet Take 1 tablet (40 mg  total) by mouth daily. 90 tablet 4   venlafaxine XR (EFFEXOR-XR) 37.5 MG 24 hr capsule Take 1 capsule (37.5 mg total) by mouth daily with breakfast. 30 capsule 1   No current facility-administered medications for this visit.    OBJECTIVE: Middle-aged white woman who appears stated age  64:   04/23/19 0848  BP: (!) 75/47  Pulse: 94  Resp: 18  Temp: 98.3 F (36.8 C)  SpO2: 97%     Body mass index is 28.17 kg/m.    ECOG FS:1 - Symptomatic but completely ambulatory  Filed Weights   04/23/19 0848  Weight: 169 lb 4.8 oz (76.8 kg)     Sclerae unicteric, EOMs intact Wearing a mask No cervical or supraclavicular adenopathy Lungs no rales or rhonchi, no dullness to percussion Heart regular rate and rhythm Abd soft, nontender, positive bowel sounds MSK diffuse spinal tenderness to moderate percussion, no upper extremity lymphedema Neuro: nonfocal, well oriented, appropriate affect Breasts: Deferred   LAB RESULTS:  CMP     Component Value Date/Time   NA 141 04/23/2019 0828   NA 141 04/25/2017 1213   NA 145 07/04/2016 1133   K 3.6 04/23/2019 0828   K 4.2 07/04/2016 1133   CL 104 04/23/2019 0828   CO2 24 04/23/2019 0828   CO2 28 07/04/2016 1133   GLUCOSE 108 (H) 04/23/2019 0828   GLUCOSE 101 07/04/2016 1133   BUN 32 (H) 04/23/2019 0828   BUN 18 04/25/2017 1213   BUN 16.0 07/04/2016 1133   CREATININE 1.83 (H) 04/23/2019 0828   CREATININE 1.09 (H) 07/23/2018 1339   CREATININE 0.9 07/04/2016 1133   CALCIUM 9.6 04/23/2019 0828   CALCIUM 9.9 07/04/2016 1133   PROT 7.4 04/23/2019 0828   PROT 7.3 07/04/2016 1133   ALBUMIN 2.8 (L) 04/23/2019 0828   ALBUMIN 3.8 07/04/2016 1133   AST 85 (H) 04/23/2019 0828   AST 35 07/23/2018 1339   AST 19 07/04/2016 1133   ALT 37 04/23/2019 0828   ALT 25 07/23/2018 1339    ALT 17 07/04/2016 1133   ALKPHOS 229 (H) 04/23/2019 0828   ALKPHOS 172 (H) 07/04/2016 1133   BILITOT 1.7 (H) 04/23/2019 0828   BILITOT 1.1 07/23/2018 1339   BILITOT 0.94 07/04/2016 1133   GFRNONAA 29 (L) 04/23/2019 0828   GFRNONAA 55 (L) 07/23/2018 1339   GFRAA 34 (L) 04/23/2019 0828   GFRAA >60 07/23/2018 1339    INo results found for: SPEP, UPEP  Lab Results  Component Value Date   WBC 12.3 (H) 04/23/2019   NEUTROABS 10.0 (H) 04/23/2019   HGB 12.0 04/23/2019   HCT 35.0 (L) 04/23/2019   MCV 105.4 (H) 04/23/2019   PLT 308 04/23/2019      Chemistry      Component Value Date/Time   NA 141 04/23/2019 0828   NA 141 04/25/2017 1213   NA 145 07/04/2016 1133   K 3.6 04/23/2019 0828   K 4.2 07/04/2016 1133   CL 104 04/23/2019 0828   CO2 24 04/23/2019 0828   CO2 28 07/04/2016 1133   BUN 32 (H) 04/23/2019 0828   BUN 18 04/25/2017 1213   BUN 16.0 07/04/2016 1133   CREATININE 1.83 (H) 04/23/2019 0828   CREATININE 1.09 (H) 07/23/2018 1339   CREATININE 0.9 07/04/2016 1133      Component Value Date/Time   CALCIUM 9.6 04/23/2019 0828   CALCIUM 9.9 07/04/2016 1133   ALKPHOS 229 (H) 04/23/2019 0828   ALKPHOS 172 (H) 07/04/2016 1133  AST 85 (H) 04/23/2019 0828   AST 35 07/23/2018 1339   AST 19 07/04/2016 1133   ALT 37 04/23/2019 0828   ALT 25 07/23/2018 1339   ALT 17 07/04/2016 1133   BILITOT 1.7 (H) 04/23/2019 0828   BILITOT 1.1 07/23/2018 1339   BILITOT 0.94 07/04/2016 1133     No results found for: LABCA2  No components found for: LABCA125  No results for input(s): INR in the last 168 hours.  Urinalysis    Component Value Date/Time   COLORURINE YELLOW 07/20/2018 1747   APPEARANCEUR CLEAR 07/20/2018 1747   LABSPEC 1.019 07/20/2018 1747   PHURINE 5.0 07/20/2018 1747   GLUCOSEU NEGATIVE 07/20/2018 1747   GLUCOSEU NEGATIVE 10/20/2017 1303   HGBUR NEGATIVE 07/20/2018 1747   BILIRUBINUR NEGATIVE 07/20/2018 1747   KETONESUR 20 (A) 07/20/2018 1747   PROTEINUR  NEGATIVE 07/20/2018 1747   UROBILINOGEN 1.0 10/20/2017 1303   NITRITE NEGATIVE 07/20/2018 1747   LEUKOCYTESUR NEGATIVE 07/20/2018 1747    STUDIES: DG Chest 2 View  Result Date: 04/23/2019 CLINICAL DATA:  65 year old female with history of severe shortness of breath. EXAM: CHEST - 2 VIEW COMPARISON:  Chest x-ray 05/13/2018. FINDINGS: Emphysematous changes in the lungs bilaterally. Irregular opacities are noted in the area of the lower lobes bilaterally, new compared to the prior chest x-ray, but corresponding to areas seen on prior chest CT 11/11/2018. No pleural effusions. No evidence of pulmonary edema. Heart size is normal. Upper mediastinal contours are within normal limits. Aortic atherosclerosis. IMPRESSION: 1. As seen on prior chest CT 11/11/2018, there are regular opacities in the medial aspects of the lower lobes of the lungs bilaterally, which are likely sequela of prior radiation therapy. No other acute findings are noted on today's examination. 2. Aortic atherosclerosis. Electronically Signed   By: Vinnie Langton M.D.   On: 04/23/2019 10:11     ASSESSMENT: 64 y.o. [place] woman status post left lumpectomy 12/28/2014 for ductal carcinoma in situ, high-grade measuring 0.2 cm, estrogen and progesterone receptor positive, with close but negative margins  (1) adjuvant radiation 02/02/2015 through 03/24/2015: The patient initially received a dose of 50.4 Gy in 28 fractions to the breast using whole-breast tangent fields. This was delivered using a 3-D conformal technique. The patient then received a boost to the seroma. This delivered an additional 10 Gy in 5 fractions using a en face electron field. The total dose was 60.4 Gy.   (2) anastrozole started 04/19/2015   (a) bone density 03/01/2016 shows a T score of -1.2  (3) mildly abnormal hepatic function panel: negative workup for hepatitis B and C, normal alpha-1-anti-trypsin  (4) left lower extremity DVT diagnosed by Doppler  ultrasonography 04/16/2016, with negative CT angiogram chest  (a) on Xarelto starting 04/16/2016   (b) negative hypercoagulable panel, with a minimally abnormal (indeterminate) anti-cardiolipin IgM  ((c) stopped rivaroxaban 07/16/2016, repeat D-Dimer WNL August 2018  METASTATIC DISEASE: December 2019: bone only (5) CT chest 02/06/2018 shows a mixed lytic and sclerotic process with fracture at the inferior manubrium but no other bone lesions.  However bone scan 02/16/2018 shows multiple bone lesions consistent with metastatic disease; no lung or pleura involvement, no adenopathy  (a) Bone marrow biopsy on 03/03/2018 shows metastatic carcinoma consistent with breast primary, estrogen and progesterone receptor strongly positive, HER-2 not amplified (1+)  (b) MRI abdomen and MRI brain at [facility] 03/17/2018 (results viewable in "care everywhere") notes known bone involvement in spine and pelvis, but no other area of metastatic disease  in abdomen/pelvis, or brain.  (c) CA 27.29 not informative (37.9 on 03/03/2018)  (6) started denosumab/Xgeva on 03/18/2018, repeated Q28 d  (7) started fulvestrant on 03/03/2018 and palbociclib 03/18/2018 at 125 mg/d 21/7  (a) bone scan 06/10/2018 serves as our new baseline  (b) chest CT scan on 11/11/2018 shows multiple sclerotic bone lesions (treatment effect) no visceral lesions; lungs also show radiation change  (c) CT angio 11/16/2018 shows no evidence of pulmonary embolism  (8) palliative radiation 07/01/2018-07/14/2018:  The patient received 30 Gy in 10 fractions to bilateral ribs and lower thoracic spine  (a) esophagitis secondary to radiation has resolved  (9) pain management/bowel prophylaxis  (a) currently on MS Contin 15 mg twice daily with morphine liquid 10 mg and 5 cc as needed  (b) bowel prophylaxis   PLAN: Margery is now a little over a year out from definitive diagnosis of metastatic breast cancer.  Her disease to the best of our ability to tell to  date is bone only.  She has tolerated her treatment well so far and she was scheduled for restaging studies next week.  She has had some respiratory problems for which she says improved a bit with an inhaler and a Z-Pak, but she appears quite short of breath today.  She has a saturation of 97% and an excellent hemoglobin.  She has rib cage pain on deep inspiration.  Possibly what we are really seeing is pain on inspiration secondary to the bone metastases.  However I am concerned regarding the possibility of a pulmonary embolus in this patient with stage IV breast cancer and we are obtaining a CT angio today.  She was scheduled for CT of the chest in any case next week and of course we will cancel that study.  She did have an echocardiogram on 11/01/2016 which showed an ejection fraction in the 40-45% range.  However she had a myocardial perfusion study 11/01/2016 showing an ejection fraction in the 54% range.  She was evaluated by Dr. Tamala Julian at that time.  For now I am stopping her blood pressure medication since her blood pressure is very low.  We also reviewed her pain management and I wrote out for her the importance of taking the MS Contin twice daily as prescribed and the breakthrough pain medicine as needed.  She will also continue the ibuprofen and Tylenol 3 times a day, which will help her take fewer narcotics.    Her concern is not addiction but not being able to function at work.  We discussed the fact that the goal of pain management is precisely to help her have is normal a functional status as possible.  I asked her to make sure to keep tabs of what she is taking and bring that information to me when she returns next week.  I did ask her to stop by the office after her CT angio today to review those results.  Total encounter time 45 minutes.*  ADDENDUM: Jeneen Rinks a creatinine is up today to 1.83 and her GFR is down just below 30.  This is very likely due to her use of ibuprofen for pain.  We  are stopping that medication.  Nevertheless it means that I cannot obtain a CT angio today.  We have switched her over to a VQ scan.  That requires a Covid test.  That was just done.  She will have lunch and then come back for her VQ scan.  I will call her with those results as  soon as they come in.  She did not have any pain medicine with her.  She does not have any pain right now but I am concerned she might develop it later.  We are giving her 2 Percocet to keep in her purse for locations like this.  She tells me she does not have a history of codeine allergy.  I wrote her a prescription so she can be excused from work today.  We also discussed her considering a disability application.  Virgie Dad. Makhiya Coburn, MD 04/23/19 10:48 AM Medical Oncology and Hematology [facility], [place] [address] Tel. [phone]    Fax. [phone]   I, Wilburn Mylar, am acting as scribe for Dr. Virgie Dad. Mikaeel Petrow.  I, Lurline Del MD, have reviewed the above documentation for accuracy and completeness, and I agree with the above.   *Total Encounter Time as defined by the Centers for Medicare and Medicaid Services includes, in addition to the face-to-face time of a patient visit (documented in the note above) non-face-to-face time: obtaining and reviewing outside history, ordering and reviewing medications, tests or procedures, care coordination (communications with other health care professionals or caregivers) and documentation in the medical record.

## 2019-04-23 ENCOUNTER — Encounter (HOSPITAL_COMMUNITY): Payer: Self-pay

## 2019-04-23 ENCOUNTER — Inpatient Hospital Stay (HOSPITAL_BASED_OUTPATIENT_CLINIC_OR_DEPARTMENT_OTHER): Payer: BC Managed Care – PPO | Admitting: Medical

## 2019-04-23 ENCOUNTER — Ambulatory Visit (HOSPITAL_COMMUNITY)
Admission: RE | Admit: 2019-04-23 | Discharge: 2019-04-23 | Disposition: A | Discharge status: Home / Self Care | Payer: BC Managed Care – PPO | Source: Ambulatory Visit | Attending: Oncology | Admitting: Oncology

## 2019-04-23 ENCOUNTER — Other Ambulatory Visit: Payer: Self-pay | Admitting: Medical

## 2019-04-23 ENCOUNTER — Other Ambulatory Visit: Payer: Self-pay

## 2019-04-23 ENCOUNTER — Inpatient Hospital Stay: Payer: BC Managed Care – PPO

## 2019-04-23 ENCOUNTER — Inpatient Hospital Stay (HOSPITAL_BASED_OUTPATIENT_CLINIC_OR_DEPARTMENT_OTHER): Payer: BC Managed Care – PPO | Admitting: Oncology

## 2019-04-23 ENCOUNTER — Other Ambulatory Visit: Payer: Self-pay | Admitting: *Deleted

## 2019-04-23 VITALS — BP 75/47 | HR 94 | Temp 98.3°F | Resp 18 | Ht 65.0 in | Wt 169.3 lb

## 2019-04-23 DIAGNOSIS — Z7189 Other specified counseling: Secondary | ICD-10-CM | POA: Insufficient documentation

## 2019-04-23 DIAGNOSIS — C50512 Malignant neoplasm of lower-outer quadrant of left female breast: Secondary | ICD-10-CM | POA: Diagnosis not present

## 2019-04-23 DIAGNOSIS — Z170 Estrogen receptor positive status [ER+]: Secondary | ICD-10-CM

## 2019-04-23 DIAGNOSIS — G893 Neoplasm related pain (acute) (chronic): Secondary | ICD-10-CM

## 2019-04-23 DIAGNOSIS — I824Z2 Acute embolism and thrombosis of unspecified deep veins of left distal lower extremity: Secondary | ICD-10-CM

## 2019-04-23 DIAGNOSIS — C7951 Secondary malignant neoplasm of bone: Secondary | ICD-10-CM | POA: Diagnosis not present

## 2019-04-23 DIAGNOSIS — Z5111 Encounter for antineoplastic chemotherapy: Secondary | ICD-10-CM | POA: Diagnosis not present

## 2019-04-23 LAB — CBC WITH DIFFERENTIAL/PLATELET
Abs Immature Granulocytes: 0.07 10*3/uL (ref 0.00–0.07)
Basophils Absolute: 0.1 10*3/uL (ref 0.0–0.1)
Basophils Relative: 1 %
Eosinophils Absolute: 0 10*3/uL (ref 0.0–0.5)
Eosinophils Relative: 0 %
HCT: 35 % — ABNORMAL LOW (ref 36.0–46.0)
Hemoglobin: 12 g/dL (ref 12.0–15.0)
Immature Granulocytes: 1 %
Lymphocytes Relative: 4 %
Lymphs Abs: 0.5 10*3/uL — ABNORMAL LOW (ref 0.7–4.0)
MCH: 36.1 pg — ABNORMAL HIGH (ref 26.0–34.0)
MCHC: 34.3 g/dL (ref 30.0–36.0)
MCV: 105.4 fL — ABNORMAL HIGH (ref 80.0–100.0)
Monocytes Absolute: 1.6 10*3/uL — ABNORMAL HIGH (ref 0.1–1.0)
Monocytes Relative: 13 %
Neutro Abs: 10 10*3/uL — ABNORMAL HIGH (ref 1.7–7.7)
Neutrophils Relative %: 81 %
Platelets: 308 10*3/uL (ref 150–400)
RBC: 3.32 MIL/uL — ABNORMAL LOW (ref 3.87–5.11)
RDW: 13.5 % (ref 11.5–15.5)
WBC: 12.3 10*3/uL — ABNORMAL HIGH (ref 4.0–10.5)
nRBC: 0 % (ref 0.0–0.2)

## 2019-04-23 LAB — COMPREHENSIVE METABOLIC PANEL
ALT: 37 U/L (ref 0–44)
AST: 85 U/L — ABNORMAL HIGH (ref 15–41)
Albumin: 2.8 g/dL — ABNORMAL LOW (ref 3.5–5.0)
Alkaline Phosphatase: 229 U/L — ABNORMAL HIGH (ref 38–126)
Anion gap: 13 (ref 5–15)
BUN: 32 mg/dL — ABNORMAL HIGH (ref 8–23)
CO2: 24 mmol/L (ref 22–32)
Calcium: 9.6 mg/dL (ref 8.9–10.3)
Chloride: 104 mmol/L (ref 98–111)
Creatinine, Ser: 1.83 mg/dL — ABNORMAL HIGH (ref 0.44–1.00)
GFR calc Af Amer: 34 mL/min — ABNORMAL LOW (ref >60)
GFR calc non Af Amer: 29 mL/min — ABNORMAL LOW (ref >60)
Glucose, Bld: 108 mg/dL — ABNORMAL HIGH (ref 70–99)
Potassium: 3.6 mmol/L (ref 3.5–5.1)
Sodium: 141 mmol/L (ref 135–145)
Total Bilirubin: 1.7 mg/dL — ABNORMAL HIGH (ref 0.3–1.2)
Total Protein: 7.4 g/dL (ref 6.5–8.1)

## 2019-04-23 LAB — CEA (IN HOUSE-CHCC): CEA (CHCC-In House): 22.22 ng/mL — ABNORMAL HIGH (ref 0.00–5.00)

## 2019-04-23 LAB — SARS CORONAVIRUS 2 (TAT 6-24 HRS): SARS Coronavirus 2: NEGATIVE

## 2019-04-23 MED ORDER — TECHNETIUM TO 99M ALBUMIN AGGREGATED
1.4000 | Freq: Once | INTRAVENOUS | Status: AC
  Administered 2019-04-23: 14:00:00 1.4 via INTRAVENOUS

## 2019-04-23 MED ORDER — TECHNETIUM TC 99M DIETHYLENETRIAME-PENTAACETIC ACID
38.6000 | Freq: Once | INTRAVENOUS | Status: AC
  Administered 2019-04-23: 15:00:00 38.6 via RESPIRATORY_TRACT

## 2019-04-23 MED ORDER — OXYCODONE-ACETAMINOPHEN 5-325 MG PO TABS
ORAL_TABLET | ORAL | Status: AC
  Filled 2019-04-23: qty 2

## 2019-04-23 MED ORDER — OXYCODONE-ACETAMINOPHEN 5-325 MG PO TABS
2.0000 | ORAL_TABLET | Freq: Once | ORAL | Status: AC
  Administered 2019-04-23: 11:00:00 2 via ORAL

## 2019-04-23 MED ORDER — OXYCODONE-ACETAMINOPHEN 5-325 MG PO TABS: 2.0000 | ORAL_TABLET | Freq: Once | ORAL | Status: DC

## 2019-04-23 NOTE — Addendum Note (Signed)
Addended by: Laureen Abrahams on: 04/23/2019 04:35 PM   Modules accepted: Orders

## 2019-04-23 NOTE — Progress Notes (Signed)
This patient was seen and the breast clinic for COVID-19 testing.  A sample was collected and was taken to the main laboratory at [facility], [facility], PA-C Physician Assistant

## 2019-04-24 LAB — CANCER ANTIGEN 27.29: CA 27.29: 178.9 U/mL — ABNORMAL HIGH (ref 0.0–38.6)

## 2019-04-26 ENCOUNTER — Other Ambulatory Visit: Payer: Self-pay | Admitting: Oncology

## 2019-04-26 DIAGNOSIS — C50512 Malignant neoplasm of lower-outer quadrant of left female breast: Secondary | ICD-10-CM

## 2019-04-26 NOTE — Progress Notes (Signed)
I called Caitlin Wall and let her know her ventilation/perfusion scan did not show evidence of a clot.  She tells me she has no immediate needs at present.  She is scheduled for restaging studies this Friday and to see me again next Monday.  She will call with any other issues that may develop before then.

## 2019-04-30 ENCOUNTER — Other Ambulatory Visit: Payer: Self-pay | Admitting: Oncology

## 2019-04-30 ENCOUNTER — Other Ambulatory Visit: Payer: Self-pay

## 2019-04-30 ENCOUNTER — Ambulatory Visit (HOSPITAL_COMMUNITY)
Admission: RE | Admit: 2019-04-30 | Discharge: 2019-04-30 | Disposition: A | Discharge status: Home / Self Care | Payer: BC Managed Care – PPO | Source: Ambulatory Visit | Attending: Oncology | Admitting: Oncology

## 2019-04-30 ENCOUNTER — Encounter (HOSPITAL_COMMUNITY)
Admission: RE | Admit: 2019-04-30 | Discharge: 2019-04-30 | Disposition: A | Discharge status: Home / Self Care | Payer: BC Managed Care – PPO | Source: Ambulatory Visit | Attending: Oncology | Admitting: Oncology

## 2019-04-30 DIAGNOSIS — C7951 Secondary malignant neoplasm of bone: Secondary | ICD-10-CM

## 2019-04-30 DIAGNOSIS — Z7189 Other specified counseling: Secondary | ICD-10-CM

## 2019-04-30 DIAGNOSIS — C50512 Malignant neoplasm of lower-outer quadrant of left female breast: Secondary | ICD-10-CM | POA: Diagnosis not present

## 2019-04-30 DIAGNOSIS — G893 Neoplasm related pain (acute) (chronic): Secondary | ICD-10-CM | POA: Diagnosis present

## 2019-04-30 DIAGNOSIS — Z170 Estrogen receptor positive status [ER+]: Secondary | ICD-10-CM | POA: Diagnosis present

## 2019-04-30 MED ORDER — TECHNETIUM TC 99M MEDRONATE IV KIT
22.0000 | PACK | Freq: Once | INTRAVENOUS | Status: AC
  Administered 2019-04-30: 22 via INTRAVENOUS

## 2019-04-30 NOTE — Progress Notes (Signed)
I called Caitlin Wall and let her know that we are seeing spots in her liver.  We will see if we can biopsy that under ultrasound guidance.  I am going to stop the fulvestrant and palbociclib and depending on the biopsy results consider other targeted therapies versus starting capecitabine.

## 2019-05-02 NOTE — Progress Notes (Signed)
[place] [facility]  Telephone:([phone]) [phone] Fax:(336) [phone]    ID: Caitlin Wall DOB: [birth year]  MR#: [id number]  BWL#:[id number]  Patient Care Team: Caitlin Borg, MD as PCP - General Caitlin Julian Lynnell Dike, MD as PCP - Cardiology (Cardiology) Caitlin Wall, Caitlin Dad, MD as Consulting Physician (Oncology) Caitlin Rudd, MD as Consulting Physician (Radiation Oncology) Caitlin Kussmaul, MD as Consulting Physician (General Surgery) Caitlin Cheese, NP as Nurse Practitioner (Hematology and Oncology) Caitlin Rockers, MD as Consulting Physician (Pulmonary Disease) Caitlin Pigg, MD as Consulting Physician (Obstetrics and Gynecology) OTHER MD: Caitlin Barre MD   CHIEF COMPLAINT: Metastatic estrogen receptor positive breast cancer  CURRENT TREATMENT: Denosumab/Xgeva, fluvestrant, palbociclib   INTERVAL HISTORY: Caitlin Wall returns today for follow-up and treatment of her metastatic estrogen receptor positive breast cancer.   She continues on denosumab/Xgeva.  She has been enrolled in the [employer] first step program and will pay between $0 and $5 for each dose.  She receives this every 28 days.  She tolerates this well.  There have been no dental issues in particular.  She has been treated with fulvestrant, again with good tolerance, and palbociclib.  However were going off those medications given evidence of disease progression.  She was last seen on 04/23/2019 for worsening pain and shortness of breath. She underwent chest x-ray that day that showed: stable opacities in bilateral lower lung lobes, likely sequela of prior radiation therapy; no other acute findings.  She also underwent perfusion lung scan that day showing: matched perfusion ventilation defects correspond to focal emphysema on comparison CT. This was a low probability study  Since her last visit, she underwent restaging scans on 04/30/2019. Chest CT showed: interval development of multifocal low-attenuation lesions within the  liver; progressive radiation therapy changes in the bilateral lower lobes; new 4 mm nonspecific left lower lobe lung nodule.  Bone scan showed: increased number of osteoblastic metastatic disease in axial skeleton and ribcage, and probably skull.  She is here today to discuss those results  REVIEW OF SYSTEMS: Caitlin Wall tells me she did quite a bit this [place], [place] and [place], [place] around [place] with her [place].  Recall he is physically disabled because of multiple back surgeries but mentally he is fine.  Her pain is much better controlled.  She is not taking Tylenol or Aleve anymore since the Aleve was hurting her kidneys.  She is also not using the breakthrough pain medicine.  She feels a little bit groggy in the morning and also is having significant problems with nausea.  She continues to vomit and has lost some more weight.  Despite all this she continues to work full-time.  She has not discussed disability with her human resources person yet.  She continues to be short of breath but does not have a cough.  She thinks the antibiotic she received did help.   BREAST CANCER HISTORY:  From the original intake note:   [name] [facility]. She has subpectoral saline implants in place. There was a possible asymmetry in the left breast and she was recalled for left diagnostic mammography with tomosynthesis and left breast ultrasonography 11/03/2014. The breast density was category C. In the left breast lower outer quadrant there was an irregular mass which was not palpable and which by ultrasonography measured 0.6 cm. The left axilla was sonographically benign. Biopsy of this mass 11/09/2014 showed ([id number]) fibroadipose adipose tissue.  This was felt to be discordant.  Accordingly the patient was referred to surgery and she underwent left breast radioactive seed localized lumpectomy 12/28/2014. The pathology  from that procedure ((SZA [id number]) showed ductal carcinoma in situ, high-grade, measuring 0.2 cm. This was less than 0.1 cm from the posterior margin. The cells was estrogen receptor positive at 95%, with strong staining intensity, progesterone receptor positive at 30% with moderate staining intensity.  Her subsequent history is as detailed below.   PAST MEDICAL HISTORY: Past Medical History:  Diagnosis Date  . Acute asthma 07/27/2012  . Breast cancer ([place]) 12/28/14   Left BresastDCIS  . COLONIC POLYPS, HX OF 11/04/2007  . DVT (deep venous thrombosis) ([place]) 2018   left leg  . GERD 11/04/2007  . GERD (gastroesophageal reflux disease)   . HAIR LOSS 11/04/2007  . Hearing loss    bilateral - wears hearing aids  . Hx of cardiac cath 11/15/2016   a. LHC 11/15/16 showed normal coronaries and EF 50-55% with normal EDP. (done for false positive NST)  . Hx of colonic polyps 1995   adenomatous  . Hyperlipidemia   . HYPERLIPIDEMIA 11/04/2007  . Hypertension   . HYPERTENSION 06/07/2009  . LBBB (left bundle branch block) 07/26/2011   no current problems  . Personal history of radiation therapy   . S/P radiation therapy 02/02/15-03/24/15   left breast 60.4GY    PAST SURGICAL HISTORY: Past Surgical History:  Procedure Laterality Date  . ABDOMINAL HYSTERECTOMY     partial  . ANTERIOR FUSION CERVICAL SPINE    . bilat ear surgury/hearing loss     wears hearing aids  . BREAST LUMPECTOMY Left 2016  . BREAST LUMPECTOMY WITH RADIOACTIVE SEED LOCALIZATION Left 12/28/2014   Procedure: BREAST LUMPECTOMY WITH RADIOACTIVE SEED LOCALIZATION;  Surgeon: Caitlin Messing III, MD;  Location: [facility];  Service: General;  Laterality: Left;  . COLONOSCOPY    . LEFT HEART CATH AND CORONARY ANGIOGRAPHY N/A 11/15/2016   Procedure: LEFT HEART CATH AND CORONARY ANGIOGRAPHY;  Surgeon: Belva Crome, MD;  Location: [facility];  Service: Cardiovascular;  Laterality: N/A;  . POLYPECTOMY    . UPPER  GASTROINTESTINAL ENDOSCOPY    . VULVECTOMY PARTIAL N/A 02/02/2019   Procedure: VULVECTOMY PARTIAL;  Surgeon: Caitlin Pigg, MD;  Location[facility];  Service: Gynecology;  Laterality: N/A;  . WISDOM TOOTH EXTRACTION      FAMILY HISTORY: Family History  Problem Relation Age of Onset  . Breast cancer Other   . Cancer Paternal Grandmother        breast  . Lung cancer Father   . Breast cancer Maternal Grandmother   . Healthy Mother   . Coronary artery disease Neg Hx   . Colon cancer Neg Hx   . Pancreatic cancer Neg Hx   . Rectal cancer Neg Hx   . Stomach cancer Neg Hx   . Esophageal cancer Neg Hx    [name] died from lung cancer in the setting of tobacco abuse at age 62. [name] died from "old age" at age 40. The patient had one brother, 3 sisters. The brother had" I cancer" requiring enucleation--the patient does not know whether this was a primary ocular melanoma. [name] was diagnosed with breast cancer but the patient does not know at what age. There is no other history of breast or ovarian cancer in the family   GYNECOLOGIC HISTORY:  No LMP recorded (lmp unknown).  Patient has had a hysterectomy. Menarche age 40, first live birth age 29. The patient is GX P3. She is status post hysterectomy without salpingo-oophorectomy. She did not take hormone replacement. She did use oral contraceptives remotely without any complications.   SOCIAL HISTORY:  Nisa works in accounts payable. Her husband [name] is disabled because of back problems. [name] is a Marine scientist working in rehabilitation in [place]. [name] lives in [place] where he works as a Games developer. [name] lives in [place] where he works as a Emergency planning/management officer. The patient has 4 grandchildren. She is not a Ambulance person.    ADVANCED DIRECTIVES: Not in place   HEALTH MAINTENANCE: Social History   Tobacco Use  . Smoking status:  Former Smoker    Packs/day: 1.00    Years: 20.00    Pack years: 20.00    Types: Cigarettes    Quit date: 08/19/1989    Years since quitting: 29.7  . Smokeless tobacco: Never Used  Substance Use Topics  . Alcohol use: Yes    Comment: rare  . Drug use: No    Colonoscopy: 10/2018, repeat 2027 (Dr. Fuller Plan)  PAP: s/p hysterectomy  Bone density: 02/2018, -1.7  Allergies  Allergen Reactions  . Codeine     REACTION: nausea,  . Tramadol Hcl Nausea Only    Current Outpatient Medications  Medication Sig Dispense Refill  . aspirin EC 81 MG tablet Take 81 mg by mouth daily.    . budesonide-formoterol (SYMBICORT) 160-4.5 MCG/ACT inhaler Inhale 2 puffs into the lungs 2 (two) times daily. 1 Inhaler 3  . capecitabine (XELODA) 500 MG tablet Take 3 tablets (1,500 mg total) by mouth 2 (two) times daily after a meal. Take for 14 days, then do not take for 7 days, then repeat. 84 tablet 4  . cholecalciferol (VITAMIN D3) 25 MCG (1000 UT) tablet Take 1 tablet (1,000 Units total) by mouth daily.    . Ipratropium-Albuterol (COMBIVENT) 20-100 MCG/ACT AERS respimat Inhale 1 puff into the lungs every 6 (six) hours. 4 g 3  . morphine (MS CONTIN) 15 MG 12 hr tablet Take 1 tablet (15 mg total) by mouth every 12 (twelve) hours. 60 tablet 0  . morphine 10 MG/5ML solution Take 1.3-2.5 mLs (2.6-5 mg total) by mouth every 6 (six) hours as needed for severe pain. 30 mL 0  . ondansetron (ZOFRAN) 4 MG tablet Take 1 tablet (4 mg total) by mouth 3 (three) times daily before meals. 60 tablet 4  . pantoprazole (PROTONIX) 40 MG tablet Take 1 tablet (40 mg total) by mouth daily. 90 tablet 4   No current facility-administered medications for this visit.    OBJECTIVE: Middle-aged white woma who appears stated age  68:   05/03/19 0843  BP: (!) 102/55  Pulse: 95  Resp: 18  Temp: 98.1 F (36.7 C)  SpO2: 99%     Body mass index is 27.54 kg/m.    ECOG FS:1 - Symptomatic but completely ambulatory  Filed Weights    05/03/19 0843  Weight: 165 lb 8 oz (75.1 kg)     Sclerae unicteric, EOMs intact Wearing a mask No cervical or supraclavicular adenopathy Lungs no rales or rhonchi, no wheezes Heart regular rate and rhythm Abd soft, nontender, positive bowel sounds MSK no focal spinal tenderness, no upper extremity lymphedema Neuro: nonfocal, well oriented, anxious affect Breasts: Deferred   LAB RESULTS:  CMP     Component Value Date/Time   NA 141 04/23/2019 0828   NA  141 04/25/2017 1213   NA 145 07/04/2016 1133   K 3.6 04/23/2019 0828   K 4.2 07/04/2016 1133   CL 104 04/23/2019 0828   CO2 24 04/23/2019 0828   CO2 28 07/04/2016 1133   GLUCOSE 108 (H) 04/23/2019 0828   GLUCOSE 101 07/04/2016 1133   BUN 32 (H) 04/23/2019 0828   BUN 18 04/25/2017 1213   BUN 16.0 07/04/2016 1133   CREATININE 1.83 (H) 04/23/2019 0828   CREATININE 1.09 (H) 07/23/2018 1339   CREATININE 0.9 07/04/2016 1133   CALCIUM 9.6 04/23/2019 0828   CALCIUM 9.9 07/04/2016 1133   PROT 7.4 04/23/2019 0828   PROT 7.3 07/04/2016 1133   ALBUMIN 2.8 (L) 04/23/2019 0828   ALBUMIN 3.8 07/04/2016 1133   AST 85 (H) 04/23/2019 0828   AST 35 07/23/2018 1339   AST 19 07/04/2016 1133   ALT 37 04/23/2019 0828   ALT 25 07/23/2018 1339   ALT 17 07/04/2016 1133   ALKPHOS 229 (H) 04/23/2019 0828   ALKPHOS 172 (H) 07/04/2016 1133   BILITOT 1.7 (H) 04/23/2019 0828   BILITOT 1.1 07/23/2018 1339   BILITOT 0.94 07/04/2016 1133   GFRNONAA 29 (L) 04/23/2019 0828   GFRNONAA 55 (L) 07/23/2018 1339   GFRAA 34 (L) 04/23/2019 0828   GFRAA >60 07/23/2018 1339    INo results found for: SPEP, UPEP  Lab Results  Component Value Date   WBC 12.3 (H) 04/23/2019   NEUTROABS 10.0 (H) 04/23/2019   HGB 12.0 04/23/2019   HCT 35.0 (L) 04/23/2019   MCV 105.4 (H) 04/23/2019   PLT 308 04/23/2019      Chemistry      Component Value Date/Time   NA 141 04/23/2019 0828   NA 141 04/25/2017 1213   NA 145 07/04/2016 1133   K 3.6 04/23/2019 0828    K 4.2 07/04/2016 1133   CL 104 04/23/2019 0828   CO2 24 04/23/2019 0828   CO2 28 07/04/2016 1133   BUN 32 (H) 04/23/2019 0828   BUN 18 04/25/2017 1213   BUN 16.0 07/04/2016 1133   CREATININE 1.83 (H) 04/23/2019 0828   CREATININE 1.09 (H) 07/23/2018 1339   CREATININE 0.9 07/04/2016 1133      Component Value Date/Time   CALCIUM 9.6 04/23/2019 0828   CALCIUM 9.9 07/04/2016 1133   ALKPHOS 229 (H) 04/23/2019 0828   ALKPHOS 172 (H) 07/04/2016 1133   AST 85 (H) 04/23/2019 0828   AST 35 07/23/2018 1339   AST 19 07/04/2016 1133   ALT 37 04/23/2019 0828   ALT 25 07/23/2018 1339   ALT 17 07/04/2016 1133   BILITOT 1.7 (H) 04/23/2019 0828   BILITOT 1.1 07/23/2018 1339   BILITOT 0.94 07/04/2016 1133     No results found for: LABCA2  No components found for: LABCA125  No results for input(s): INR in the last 168 hours.  Urinalysis    Component Value Date/Time   COLORURINE YELLOW 07/20/2018 1747   APPEARANCEUR CLEAR 07/20/2018 1747   LABSPEC 1.019 07/20/2018 1747   PHURINE 5.0 07/20/2018 1747   GLUCOSEU NEGATIVE 07/20/2018 1747   GLUCOSEU NEGATIVE 10/20/2017 1303   HGBUR NEGATIVE 07/20/2018 1747   BILIRUBINUR NEGATIVE 07/20/2018 1747   KETONESUR 20 (A) 07/20/2018 1747   PROTEINUR NEGATIVE 07/20/2018 1747   UROBILINOGEN 1.0 10/20/2017 1303   NITRITE NEGATIVE 07/20/2018 1747   LEUKOCYTESUR NEGATIVE 07/20/2018 1747    STUDIES: DG Chest 2 View  Result Date: 04/23/2019 CLINICAL DATA:  64 year old female with history of  severe shortness of breath. EXAM: CHEST - 2 VIEW COMPARISON:  Chest x-ray 05/13/2018. FINDINGS: Emphysematous changes in the lungs bilaterally. Irregular opacities are noted in the area of the lower lobes bilaterally, new compared to the prior chest x-ray, but corresponding to areas seen on prior chest CT 11/11/2018. No pleural effusions. No evidence of pulmonary edema. Heart size is normal. Upper mediastinal contours are within normal limits. Aortic atherosclerosis.  IMPRESSION: 1. As seen on prior chest CT 11/11/2018, there are regular opacities in the medial aspects of the lower lobes of the lungs bilaterally, which are likely sequela of prior radiation therapy. No other acute findings are noted on today's examination. 2. Aortic atherosclerosis. Electronically Signed   By: Vinnie Langton M.D.   On: 04/23/2019 10:11   CT Chest Wo Contrast  Result Date: 04/30/2019 CLINICAL DATA:  Left breast cancer.  Restaging. EXAM: CT CHEST WITHOUT CONTRAST TECHNIQUE: Multidetector CT imaging of the chest was performed following the standard protocol without IV contrast. COMPARISON:  CT chest 11/11/2018 FINDINGS: Cardiovascular: The heart size appears within normal limits. No pericardial effusion. Aortic atherosclerosis. Mediastinum/Nodes: No enlarged mediastinal or axillary lymph nodes. Thyroid gland, trachea, and esophagus demonstrate no significant findings. Lungs/Pleura: No pleural effusion. Advanced changes of paraseptal and centrilobular emphysema. Bilateral lower lobe paravertebral fibrosis and masslike architectural distortion noted compatible with progressive changes of external beam radiation. Small nonspecific nodule within the posterior left lower lobe is new from previous exam measuring 4 mm, image 97/5. Upper Abdomen: Interval development of multifocal areas of low attenuation involving both lobes of liver concerning for metastatic disease. Index lesion within right hepatic lobe measures 2.5 cm, image 117/2. Index lesion within segment 5 measures 3.9 cm, image 151/2. Index lesion in lateral segment of left lobe of liver measures 3.6 cm, image 128/2. Musculoskeletal: Multifocal sclerotic metastases identified within the bony thorax. This is similar to previous exam. IMPRESSION: 1. Interval development of multifocal low-attenuation lesions within the liver worrisome for metastatic disease. More definitive characterization with contrast enhanced liver protocol MRI or CT  advised. 2. Progressive changes of external beam radiation within the paravertebral lower lobes. 3. New small nonspecific lung nodule within the left lower lobe measures 4 mm. Attention on follow-up imaging. 4. Similar appearance of multifocal sclerotic bone metastases. Aortic Atherosclerosis (ICD10-I70.0) and Emphysema (ICD10-J43.9). Electronically Signed   By: Kerby Moors M.D.   On: 04/30/2019 09:38   NM Bone Scan Whole Body  Result Date: 04/30/2019 CLINICAL DATA:  Metastatic breast cancer. EXAM: NUCLEAR MEDICINE WHOLE BODY BONE SCAN TECHNIQUE: Whole body anterior and posterior images were obtained approximately 3 hours after intravenous injection of radiopharmaceutical. RADIOPHARMACEUTICALS:  22.0 mCi Technetium-64mMDP IV COMPARISON:  November 11, 2018. FINDINGS: There has been interval increased number of bilateral rib abnormally increased foci of radiotracer uptake as well as new areas of radiotracer uptake in the thoracolumbar and sacral spine and ischia, and probably in the bilateral skull. IMPRESSION: Interval progression of osteoblastic metastatic disease in the axial skeleton and ribcage, and probably the skull. MR brain without and with intravenous contrast recommended. Electronically Signed   By: RRevonda Humphrey  On: 04/30/2019 21:07   NM Pulmonary Perf and Vent  Result Date: 04/23/2019 CLINICAL DATA:  Short of breath.  Asthma, COPD. EXAM: NUCLEAR MEDICINE VENTILATION - PERFUSION LUNG SCAN TECHNIQUE: Ventilation images were obtained in multiple projections using inhaled aerosol Tc-963mTPA. Perfusion images were obtained in multiple projections after intravenous injection of Tc-9981mA. RADIOPHARMACEUTICALS:  1.4 mCi of Tc-6m51mA  aerosol inhalation and 1.4 mCi Tc32mMAA IV COMPARISON:  CT 11/16/2018, chest radiograph 04/23/2019 FINDINGS: Ventilation: Decreased ventilation in the lateral RIGHT upper lobe. Decreased ventilation the medial aspect of the LEFT lower lobe on posterior projection.  Perfusion: Perfusion defect in the lateral aspect of the RIGHT upper lobe matches the ventilation defect. Additionally there is a focal emphysema in the lateral aspect the RIGHT upper lobe corresponding to this perfusion defect. Decreased perfusion to the medial LEFT lower lobe also matches the ventilation defect and there is emphysema / bronchiectasis on comparison CT. IMPRESSION: 1. Matched perfusion ventilation defects correspond to focal emphysema on comparison CT. 2. No evidence acute pulmonary embolism. Electronically Signed   By: SSuzy BouchardM.D.   On: 04/23/2019 15:43     ASSESSMENT: 64y.o. [place] woman status post left lumpectomy 12/28/2014 for ductal carcinoma in situ, high-grade measuring 0.2 cm, estrogen and progesterone receptor positive, with close but negative margins  (1) adjuvant radiation 02/02/2015 through 03/24/2015: The patient initially received a dose of 50.4 Gy in 28 fractions to the breast using whole-breast tangent fields. This was delivered using a 3-D conformal technique. The patient then received a boost to the seroma. This delivered an additional 10 Gy in 5 fractions using a en face electron field. The total dose was 60.4 Gy.   (2) anastrozole started 04/19/2015   (a) bone density 03/01/2016 shows a T score of -1.2  (3) mildly abnormal hepatic function panel: negative workup for hepatitis B and C, normal alpha-1-anti-trypsin  (4) left lower extremity DVT diagnosed by Doppler ultrasonography 04/16/2016, with negative CT angiogram chest  (a) on Xarelto starting 04/16/2016   (b) negative hypercoagulable panel, with a minimally abnormal (indeterminate) anti-cardiolipin IgM  ((c) stopped rivaroxaban 07/16/2016, repeat D-Dimer WNL August 2018  METASTATIC DISEASE: December 2019: bone only (5) CT chest 02/06/2018 shows a mixed lytic and sclerotic process with fracture at the inferior manubrium but no other bone lesions.  However bone scan 02/16/2018 shows  multiple bone lesions consistent with metastatic disease; no lung or pleura involvement, no adenopathy  (a) Bone marrow biopsy on 03/03/2018 shows metastatic carcinoma consistent with breast primary, estrogen and progesterone receptor strongly positive, HER-2 not amplified (1+)  (b) MRI abdomen and MRI brain at [facility] 03/17/2018 (results viewable in "care everywhere") notes known bone involvement in spine and pelvis, but no other area of metastatic disease in abdomen/pelvis, or brain.  (c) CA 27.29 not informative (37.9 on 03/03/2018)  (6) started denosumab/Xgeva on 03/18/2018, repeated Q28 d  (7) started fulvestrant on 03/03/2018 and palbociclib 03/18/2018 at 125 mg/d 21/7  (a) bone scan 06/10/2018 serves as our new baseline  (b) chest CT scan on 11/11/2018 shows multiple sclerotic bone lesions (treatment effect) no visceral lesions; lungs also show radiation change  (c) CT angio 11/16/2018 shows no evidence of pulmonary embolism  (d) V/Q scan 04/23/2019 read as low probability  (e) fulvestrant and palbiciclib discontinued with disease progression (bone and liver)  (8) palliative radiation 07/01/2018-07/14/2018:  The patient received 30 Gy in 10 fractions to bilateral ribs and lower thoracic spine  (a) esophagitis secondary to radiation has resolved  (9) pain management/bowel prophylaxis  (a) currently on MS Contin 15 mg twice daily with morphine liquid 10 mg and 5 cc as needed  (b) bowel prophylaxis  (10) to start capecitabine  (a) chest CT scan shows new liver lesions, bone scan shows new bone lesions 04/30/2019   PLAN: Caitlin Wall now a little over a year  out from definitive diagnosis of metastatic breast cancer.  She did generally well on the fulvestrant and palbociclib but there is now evidence of clear disease recurrence both in the bone and now in soft tissues namely the liver.  She understands that this means those drugs are no longer effective and we are discontinuing them.  Most  likely we are going to move onto capecitabine and we had an initial discussion regarding that today.  We will not start that though until she sees me again at the end of this month.  Between now and then she should have a liver biopsy.  This will tell us whether the tumor is still estrogen receptor positive and also we can send it for [facility] molecular studies.  She vomited during today's visit.  I am adding Zofran for her to take before meals.  I also discussed a weight gaining diet for with her.  I spinally suggested that she eat snacks between meals as well.  I urged her to meet with her human resources person to decide if she should be under disability or not as it is not clear to me how much longer she is going to be able to work.  I also suggested she speak with her children.  She has not told them what is going on.  She thinks her husband may have told him.  I do hear from families where the patient does not discuss their situation openly and they frequently regret that they did not spend more time with the patient or do more things they planned to do with the patient once the patient dies.  She will return to see me on 05/21/2019 to discuss results of the liver biopsy and we will likely start capecitabine on 05/25/2019 which is when she gets her next Xgeva dose.  Total encounter time 45 minutes.   Caitlin Wall. Judyth Demarais, MD 05/03/19 9:29 AM Medical Oncology and Hematology [place],  [address] Tel. [phone]    Fax. [phone]   I, Wilburn Mylar, am acting as scribe for Dr. Virgie Wall. Imelda Dandridge.  I, Lurline Del MD, have reviewed the above documentation for accuracy and completeness, and I agree with the above.   *Total Encounter Time as defined by the Centers for Medicare and Medicaid Services includes, in addition to the face-to-face time of a patient visit (documented in the note above) non-face-to-face time: obtaining and  reviewing outside history, ordering and reviewing medications, tests or procedures, care coordination (communications with other health care professionals or caregivers) and documentation in the medical record.

## 2019-05-03 ENCOUNTER — Telehealth: Payer: Self-pay

## 2019-05-03 ENCOUNTER — Other Ambulatory Visit: Payer: 59

## 2019-05-03 ENCOUNTER — Inpatient Hospital Stay: Payer: BC Managed Care – PPO | Attending: Oncology

## 2019-05-03 ENCOUNTER — Inpatient Hospital Stay (HOSPITAL_BASED_OUTPATIENT_CLINIC_OR_DEPARTMENT_OTHER): Payer: BC Managed Care – PPO | Admitting: Oncology

## 2019-05-03 ENCOUNTER — Other Ambulatory Visit: Payer: Self-pay

## 2019-05-03 ENCOUNTER — Inpatient Hospital Stay: Payer: BC Managed Care – PPO

## 2019-05-03 ENCOUNTER — Ambulatory Visit: Payer: Self-pay

## 2019-05-03 ENCOUNTER — Ambulatory Visit: Payer: BC Managed Care – PPO | Admitting: Oncology

## 2019-05-03 ENCOUNTER — Telehealth: Payer: Self-pay | Admitting: Oncology

## 2019-05-03 ENCOUNTER — Other Ambulatory Visit: Payer: BC Managed Care – PPO

## 2019-05-03 ENCOUNTER — Telehealth: Payer: Self-pay | Admitting: Pharmacist

## 2019-05-03 ENCOUNTER — Ambulatory Visit: Payer: 59

## 2019-05-03 VITALS — BP 102/55 | HR 95 | Temp 98.1°F | Resp 18 | Ht 65.0 in | Wt 165.5 lb

## 2019-05-03 DIAGNOSIS — Z90710 Acquired absence of both cervix and uterus: Secondary | ICD-10-CM | POA: Insufficient documentation

## 2019-05-03 DIAGNOSIS — R160 Hepatomegaly, not elsewhere classified: Secondary | ICD-10-CM | POA: Diagnosis not present

## 2019-05-03 DIAGNOSIS — G893 Neoplasm related pain (acute) (chronic): Secondary | ICD-10-CM | POA: Diagnosis not present

## 2019-05-03 DIAGNOSIS — R1110 Vomiting, unspecified: Secondary | ICD-10-CM | POA: Diagnosis not present

## 2019-05-03 DIAGNOSIS — C787 Secondary malignant neoplasm of liver and intrahepatic bile duct: Secondary | ICD-10-CM | POA: Insufficient documentation

## 2019-05-03 DIAGNOSIS — E785 Hyperlipidemia, unspecified: Secondary | ICD-10-CM | POA: Diagnosis not present

## 2019-05-03 DIAGNOSIS — K219 Gastro-esophageal reflux disease without esophagitis: Secondary | ICD-10-CM | POA: Diagnosis not present

## 2019-05-03 DIAGNOSIS — C7951 Secondary malignant neoplasm of bone: Secondary | ICD-10-CM

## 2019-05-03 DIAGNOSIS — K769 Liver disease, unspecified: Secondary | ICD-10-CM | POA: Insufficient documentation

## 2019-05-03 DIAGNOSIS — Z79899 Other long term (current) drug therapy: Secondary | ICD-10-CM | POA: Insufficient documentation

## 2019-05-03 DIAGNOSIS — C50512 Malignant neoplasm of lower-outer quadrant of left female breast: Secondary | ICD-10-CM

## 2019-05-03 DIAGNOSIS — K5900 Constipation, unspecified: Secondary | ICD-10-CM | POA: Insufficient documentation

## 2019-05-03 DIAGNOSIS — Z8719 Personal history of other diseases of the digestive system: Secondary | ICD-10-CM | POA: Diagnosis not present

## 2019-05-03 DIAGNOSIS — Z7952 Long term (current) use of systemic steroids: Secondary | ICD-10-CM | POA: Insufficient documentation

## 2019-05-03 DIAGNOSIS — Z170 Estrogen receptor positive status [ER+]: Secondary | ICD-10-CM | POA: Diagnosis not present

## 2019-05-03 DIAGNOSIS — Z923 Personal history of irradiation: Secondary | ICD-10-CM | POA: Insufficient documentation

## 2019-05-03 DIAGNOSIS — R110 Nausea: Secondary | ICD-10-CM | POA: Diagnosis not present

## 2019-05-03 DIAGNOSIS — M5136 Other intervertebral disc degeneration, lumbar region: Secondary | ICD-10-CM | POA: Insufficient documentation

## 2019-05-03 DIAGNOSIS — I10 Essential (primary) hypertension: Secondary | ICD-10-CM | POA: Insufficient documentation

## 2019-05-03 DIAGNOSIS — M50221 Other cervical disc displacement at C4-C5 level: Secondary | ICD-10-CM | POA: Diagnosis not present

## 2019-05-03 DIAGNOSIS — Z7189 Other specified counseling: Secondary | ICD-10-CM

## 2019-05-03 DIAGNOSIS — Z87891 Personal history of nicotine dependence: Secondary | ICD-10-CM | POA: Insufficient documentation

## 2019-05-03 DIAGNOSIS — Z86718 Personal history of other venous thrombosis and embolism: Secondary | ICD-10-CM | POA: Insufficient documentation

## 2019-05-03 DIAGNOSIS — Z7901 Long term (current) use of anticoagulants: Secondary | ICD-10-CM | POA: Insufficient documentation

## 2019-05-03 DIAGNOSIS — Z801 Family history of malignant neoplasm of trachea, bronchus and lung: Secondary | ICD-10-CM | POA: Insufficient documentation

## 2019-05-03 DIAGNOSIS — R911 Solitary pulmonary nodule: Secondary | ICD-10-CM | POA: Insufficient documentation

## 2019-05-03 DIAGNOSIS — M549 Dorsalgia, unspecified: Secondary | ICD-10-CM | POA: Diagnosis not present

## 2019-05-03 DIAGNOSIS — J449 Chronic obstructive pulmonary disease, unspecified: Secondary | ICD-10-CM | POA: Diagnosis not present

## 2019-05-03 DIAGNOSIS — Z803 Family history of malignant neoplasm of breast: Secondary | ICD-10-CM | POA: Insufficient documentation

## 2019-05-03 DIAGNOSIS — I700 Atherosclerosis of aorta: Secondary | ICD-10-CM | POA: Diagnosis not present

## 2019-05-03 DIAGNOSIS — R200 Anesthesia of skin: Secondary | ICD-10-CM | POA: Insufficient documentation

## 2019-05-03 DIAGNOSIS — Z888 Allergy status to other drugs, medicaments and biological substances status: Secondary | ICD-10-CM | POA: Insufficient documentation

## 2019-05-03 DIAGNOSIS — I824Z2 Acute embolism and thrombosis of unspecified deep veins of left distal lower extremity: Secondary | ICD-10-CM

## 2019-05-03 DIAGNOSIS — Z885 Allergy status to narcotic agent status: Secondary | ICD-10-CM | POA: Insufficient documentation

## 2019-05-03 LAB — COMPREHENSIVE METABOLIC PANEL
ALT: 45 U/L — ABNORMAL HIGH (ref 0–44)
AST: 177 U/L (ref 15–41)
Albumin: 2.6 g/dL — ABNORMAL LOW (ref 3.5–5.0)
Alkaline Phosphatase: 283 U/L — ABNORMAL HIGH (ref 38–126)
Anion gap: 12 (ref 5–15)
BUN: 14 mg/dL (ref 8–23)
CO2: 29 mmol/L (ref 22–32)
Calcium: 9.1 mg/dL (ref 8.9–10.3)
Chloride: 101 mmol/L (ref 98–111)
Creatinine, Ser: 1.25 mg/dL — ABNORMAL HIGH (ref 0.44–1.00)
GFR calc Af Amer: 53 mL/min — ABNORMAL LOW (ref >60)
GFR calc non Af Amer: 46 mL/min — ABNORMAL LOW (ref >60)
Glucose, Bld: 118 mg/dL — ABNORMAL HIGH (ref 70–99)
Potassium: 3.9 mmol/L (ref 3.5–5.1)
Sodium: 142 mmol/L (ref 135–145)
Total Bilirubin: 1.2 mg/dL (ref 0.3–1.2)
Total Protein: 7 g/dL (ref 6.5–8.1)

## 2019-05-03 LAB — CBC WITH DIFFERENTIAL/PLATELET
Abs Immature Granulocytes: 0.4 10*3/uL — ABNORMAL HIGH (ref 0.00–0.07)
Basophils Absolute: 0.1 10*3/uL (ref 0.0–0.1)
Basophils Relative: 1 %
Eosinophils Absolute: 0.1 10*3/uL (ref 0.0–0.5)
Eosinophils Relative: 1 %
HCT: 34.3 % — ABNORMAL LOW (ref 36.0–46.0)
Hemoglobin: 11.3 g/dL — ABNORMAL LOW (ref 12.0–15.0)
Immature Granulocytes: 3 %
Lymphocytes Relative: 5 %
Lymphs Abs: 0.6 10*3/uL — ABNORMAL LOW (ref 0.7–4.0)
MCH: 35 pg — ABNORMAL HIGH (ref 26.0–34.0)
MCHC: 32.9 g/dL (ref 30.0–36.0)
MCV: 106.2 fL — ABNORMAL HIGH (ref 80.0–100.0)
Monocytes Absolute: 1.1 10*3/uL — ABNORMAL HIGH (ref 0.1–1.0)
Monocytes Relative: 10 %
Neutro Abs: 9.6 10*3/uL — ABNORMAL HIGH (ref 1.7–7.7)
Neutrophils Relative %: 80 %
Platelets: 327 10*3/uL (ref 150–400)
RBC: 3.23 MIL/uL — ABNORMAL LOW (ref 3.87–5.11)
RDW: 14.4 % (ref 11.5–15.5)
WBC: 11.9 10*3/uL — ABNORMAL HIGH (ref 4.0–10.5)
nRBC: 0.3 % — ABNORMAL HIGH (ref 0.0–0.2)

## 2019-05-03 MED ORDER — ONDANSETRON HCL 4 MG PO TABS: 4.0000 mg | ORAL_TABLET | Freq: Three times a day (TID) | ORAL | 4 refills | Status: DC

## 2019-05-03 MED ORDER — DENOSUMAB 120 MG/1.7ML ~~LOC~~ SOLN
120.0000 mg | Freq: Once | SUBCUTANEOUS | Status: AC
  Administered 2019-05-03: 120 mg via SUBCUTANEOUS

## 2019-05-03 MED ORDER — DENOSUMAB 120 MG/1.7ML ~~LOC~~ SOLN
SUBCUTANEOUS | Status: AC
  Filled 2019-05-03: qty 1.7

## 2019-05-03 MED ORDER — CAPECITABINE 500 MG PO TABS: 1500.0000 mg | ORAL_TABLET | Freq: Two times a day (BID) | ORAL | 4 refills | Status: AC

## 2019-05-03 NOTE — Telephone Encounter (Signed)
I talk with patient regarding schedule  

## 2019-05-03 NOTE — Patient Instructions (Signed)
Denosumab injection What is this medicine? DENOSUMAB (den oh sue mab) slows bone breakdown. Prolia is used to treat osteoporosis in women after menopause and in men, and in people who are taking corticosteroids for 6 months or more. Xgeva is used to treat a high calcium level due to cancer and to prevent bone fractures and other bone problems caused by multiple myeloma or cancer bone metastases. Xgeva is also used to treat giant cell tumor of the bone. This medicine may be used for other purposes; ask your health care provider or pharmacist if you have questions. COMMON BRAND NAME(S): Prolia, XGEVA What should I tell my health care provider before I take this medicine? They need to know if you have any of these conditions:  dental disease  having surgery or tooth extraction  infection  kidney disease  low levels of calcium or Vitamin D in the blood  malnutrition  on hemodialysis  skin conditions or sensitivity  thyroid or parathyroid disease  an unusual reaction to denosumab, other medicines, foods, dyes, or preservatives  pregnant or trying to get pregnant  breast-feeding How should I use this medicine? This medicine is for injection under the skin. It is given by a health care professional in a hospital or clinic setting. A special MedGuide will be given to you before each treatment. Be sure to read this information carefully each time. For Prolia, talk to your pediatrician regarding the use of this medicine in children. Special care may be needed. For Xgeva, talk to your pediatrician regarding the use of this medicine in children. While this drug may be prescribed for children as young as 13 years for selected conditions, precautions do apply. Overdosage: If you think you have taken too much of this medicine contact a poison control center or emergency room at once. NOTE: This medicine is only for you. Do not share this medicine with others. What if I miss a dose? It is  important not to miss your dose. Call your doctor or health care professional if you are unable to keep an appointment. What may interact with this medicine? Do not take this medicine with any of the following medications:  other medicines containing denosumab This medicine may also interact with the following medications:  medicines that lower your chance of fighting infection  steroid medicines like prednisone or cortisone This list may not describe all possible interactions. Give your health care provider a list of all the medicines, herbs, non-prescription drugs, or dietary supplements you use. Also tell them if you smoke, drink alcohol, or use illegal drugs. Some items may interact with your medicine. What should I watch for while using this medicine? Visit your doctor or health care professional for regular checks on your progress. Your doctor or health care professional may order blood tests and other tests to see how you are doing. Call your doctor or health care professional for advice if you get a fever, chills or sore throat, or other symptoms of a cold or flu. Do not treat yourself. This drug may decrease your body's ability to fight infection. Try to avoid being around people who are sick. You should make sure you get enough calcium and vitamin D while you are taking this medicine, unless your doctor tells you not to. Discuss the foods you eat and the vitamins you take with your health care professional. See your dentist regularly. Brush and floss your teeth as directed. Before you have any dental work done, tell your dentist you are   receiving this medicine. Do not become pregnant while taking this medicine or for 5 months after stopping it. Talk with your doctor or health care professional about your birth control options while taking this medicine. Women should inform their doctor if they wish to become pregnant or think they might be pregnant. There is a potential for serious side  effects to an unborn child. Talk to your health care professional or pharmacist for more information. What side effects may I notice from receiving this medicine? Side effects that you should report to your doctor or health care professional as soon as possible:  allergic reactions like skin rash, itching or hives, swelling of the face, lips, or tongue  bone pain  breathing problems  dizziness  jaw pain, especially after dental work  redness, blistering, peeling of the skin  signs and symptoms of infection like fever or chills; cough; sore throat; pain or trouble passing urine  signs of low calcium like fast heartbeat, muscle cramps or muscle pain; pain, tingling, numbness in the hands or feet; seizures  unusual bleeding or bruising  unusually weak or tired Side effects that usually do not require medical attention (report to your doctor or health care professional if they continue or are bothersome):  constipation  diarrhea  headache  joint pain  loss of appetite  muscle pain  runny nose  tiredness  upset stomach This list may not describe all possible side effects. Call your doctor for medical advice about side effects. You may report side effects to FDA at 1-800-FDA-1088. Where should I keep my medicine? This medicine is only given in a clinic, doctor's office, or other health care setting and will not be stored at home. NOTE: This sheet is a summary. It may not cover all possible information. If you have questions about this medicine, talk to your doctor, pharmacist, or health care provider.  2020 Elsevier/Gold Standard (2017-06-20 16:10:44)

## 2019-05-03 NOTE — Telephone Encounter (Signed)
Oral Oncology Patient Advocate Encounter  Prior Authorization for Xeloda has been approved.    PA# [id number] Effective dates: 05/03/19 through 05/01/20  Patients co-pay is $0  Oral Oncology Clinic will continue to follow.   [place] Patient [place] Phone [phone] Fax [phone] 05/03/2019 3:17 PM

## 2019-05-03 NOTE — Telephone Encounter (Signed)
Oral Oncology Patient Advocate Encounter  Received notification from [facility] [place] that prior authorization for Xeloda is required.  PA submitted on CoverMyMeds Key [id number] Status is pending  Oral Oncology Clinic will continue to follow.  [place] Patient [place] Phone [phone] Fax [phone] 05/03/2019 12:10 PM

## 2019-05-03 NOTE — Telephone Encounter (Signed)
Oral Oncology Pharmacist Encounter  Received new prescription for Xeloda (capecitabine) for the treatment of metastatic breast cancer, planned duration until disease progression or unacceptable drug toxicity. Planned start 05/25/19.  CMP from 05/03/19 assessed, SCr 1.25, CrCl 39mL/min. Continue to monitor renal function if there is further decline her capecitabine dose may need to be reduced by 25%. Prescription dose and frequency assessed.   Current medication list in Epic reviewed, one revelant DDIs with capecitabine identified: - Pantoprazole: Proton Pump Inhibitors (PPI) may diminish the therapeutic effect of capecitabine. Recommend evaluating the need for a PPI/acid suppression. If acid suppression is needed, recommend switching to a H2 antagonist (eg, famotidine) to avoid this DDI.   Prescription has been e-scribed to the [facility] for benefits analysis and approval.  Oral Oncology Clinic will continue to follow for insurance authorization, copayment issues, initial counseling and start date.   Darl Pikes, PharmD, BCPS, BCOP, CPP Hematology/Oncology Clinical Pharmacist [facility]/AP Oral [facility] [phone]  05/03/2019 2:25 PM

## 2019-05-04 ENCOUNTER — Encounter (HOSPITAL_COMMUNITY): Payer: Self-pay | Admitting: Radiology

## 2019-05-04 LAB — CANCER ANTIGEN 27.29: CA 27.29: 211.7 U/mL — ABNORMAL HIGH (ref 0.0–38.6)

## 2019-05-04 NOTE — Progress Notes (Signed)
Caitlin Wall. [place] Female, 64 y.o., 1955/06/12 MRN:  [id number] Phone:  [phone] Caitlin Wall) PCP:  Biagio Borg, MD Coverage:  Sherre Poot [employer] Ppo Next Appt With Oncology 05/21/2019 at 8:00 AM  RE: US Liver Biopsy Received: 3 days ago Message Contents  Sandi Mariscal, MD  Garth Bigness D  US guided liver lesion Bx.   Marked progression/development of innumerable liver lesions on chest CT from 3/5 compared to 9/21 exam.   Thx,  [name]       Previous Messages   ----- Message -----  From: Garth Bigness D  Sent: 04/30/2019  3:14 PM EST  To: [facility] Procedure Requests  Subject: US Liver Biopsy                  Procedure:  US Liver Biopsy   Reason:  Malignant neoplasm of lower-outer quadrant of left breast of female, estrogen receptor positive, breast cancer, new liver lesions   History: CT, NM scans in computer   Provider: Chauncey Cruel   Provider Contact: [phone]

## 2019-05-05 ENCOUNTER — Telehealth: Payer: Self-pay | Admitting: *Deleted

## 2019-05-05 NOTE — Telephone Encounter (Signed)
This RN received VM from [place] for [facility] stating they have received a request for Caitlin Wall from her family for establishing care.  Jeani Hawking is requesting a return call per above to [phone].  This call was retrieved late in the day post MD leaving the office.  Request will be given in am upon return to work.

## 2019-05-07 ENCOUNTER — Telehealth: Payer: Self-pay | Admitting: *Deleted

## 2019-05-07 MED ORDER — DEXAMETHASONE 2 MG PO TABS: 2.0000 mg | ORAL_TABLET | Freq: Every morning | ORAL | 1 refills | Status: DC

## 2019-05-07 NOTE — Telephone Encounter (Signed)
This RN spoke with pt today per call from [facility] regarding pt receiving hospice care - of note this RN did speak with Jeani Hawking from [facility] who stated referral came from the family and possibly the pt's son who works for [employer]  Shyli states she discussed her current situation with her son but she did not request any services - she states she wants to proceed with further work up and chemotherapy as discussed at the visit.  Of note presently Wilodean is having increased pain in her leg that impairs her ability to get around the house well. She is able to get up for bathroom needs.  She states bowel and bladder habits are not changed.  She does have some tingling in her toes.  Note she states pain has been present- it was not as profound at visit with MD.  She states she has decreased her MS Contin dose to nightly with 1 dose of the short acting at around 9am ( 10 mg )- pain has increased post change in dosing.  She states improvement in her nausea - with ability to take in some food sine MD started her on antinausea med.  Above reviewed with MD who suggested for pt to increase use of short acting to 3 doses during the day. Prescription for a low dose steroid will be sent to pharmacy to help with possible inflammation as well as nausea.  Pt is to call this RN on Monday with update with goal discussed as increased pain control with least amount of nausea and better function.  Per call as well pt understands to call over the weekend if symptoms get worse.  Pharmacy verified and prescription sent.  This RN called Sula Soda at [facility] and informed her of above and referral will not be placed at this time.

## 2019-05-10 ENCOUNTER — Other Ambulatory Visit: Payer: Self-pay | Admitting: Radiology

## 2019-05-10 ENCOUNTER — Telehealth: Payer: Self-pay | Admitting: *Deleted

## 2019-05-10 ENCOUNTER — Telehealth: Payer: Self-pay

## 2019-05-10 NOTE — Telephone Encounter (Signed)
Caitlin Wall called to this RN per follow up from Friday and pain issues and nausea.  She states since starting the antinausea medication her nausea is well controlled even with increase of pain medications over the weekend.  She states she is eating minimally but improved from last week She is able to " sip on and on all day little by little " . She states she is urinating well and does not overall feel dehydrated.  Over the weekend she increased the short acting to 2 x a day prior to use of the long acting pain medication. She also started on decadron 2 mg .  She states overall improvement Saturday but pain returning yesterday.  Due to time of day- she will take her long acting now ( 5pm ) vs 830 pm and use the short acting during the night if her pain is not controlled well.  This RN offered for pt to come in tomorrow but she states she is scheduled to come in Wednesday and would prefer not to make 2 visits.  She understands plan for this pm.  This note will be given to MD for review and further recommendation.

## 2019-05-10 NOTE — Telephone Encounter (Signed)
Attempted to call patient to set up initial fill of Xeloda at [facility]. Patient did not answer and voicemail is not set up. I will continue to attempt to reach patient.  [place] Patient [place] Phone [phone] Fax [phone] 05/10/2019 2:09 PM

## 2019-05-11 ENCOUNTER — Ambulatory Visit (HOSPITAL_COMMUNITY): Payer: BC Managed Care – PPO | Attending: Oncology

## 2019-05-11 ENCOUNTER — Ambulatory Visit (HOSPITAL_COMMUNITY): Payer: BC Managed Care – PPO

## 2019-05-12 ENCOUNTER — Inpatient Hospital Stay (HOSPITAL_BASED_OUTPATIENT_CLINIC_OR_DEPARTMENT_OTHER): Payer: BC Managed Care – PPO | Admitting: Adult Health

## 2019-05-12 ENCOUNTER — Other Ambulatory Visit: Payer: Self-pay

## 2019-05-12 ENCOUNTER — Ambulatory Visit (HOSPITAL_COMMUNITY)
Admission: RE | Admit: 2019-05-12 | Discharge: 2019-05-12 | Disposition: A | Discharge status: Home / Self Care | Payer: BC Managed Care – PPO | Source: Ambulatory Visit | Attending: Oncology | Admitting: Oncology

## 2019-05-12 ENCOUNTER — Telehealth: Payer: Self-pay | Admitting: *Deleted

## 2019-05-12 ENCOUNTER — Other Ambulatory Visit: Payer: Self-pay | Admitting: *Deleted

## 2019-05-12 VITALS — BP 112/70 | HR 104 | Temp 98.2°F | Resp 18 | Ht 65.0 in | Wt 160.4 lb

## 2019-05-12 DIAGNOSIS — R200 Anesthesia of skin: Secondary | ICD-10-CM | POA: Diagnosis present

## 2019-05-12 DIAGNOSIS — Z923 Personal history of irradiation: Secondary | ICD-10-CM | POA: Insufficient documentation

## 2019-05-12 DIAGNOSIS — C7951 Secondary malignant neoplasm of bone: Secondary | ICD-10-CM | POA: Insufficient documentation

## 2019-05-12 DIAGNOSIS — C787 Secondary malignant neoplasm of liver and intrahepatic bile duct: Secondary | ICD-10-CM

## 2019-05-12 DIAGNOSIS — C50919 Malignant neoplasm of unspecified site of unspecified female breast: Secondary | ICD-10-CM | POA: Insufficient documentation

## 2019-05-12 DIAGNOSIS — M25569 Pain in unspecified knee: Secondary | ICD-10-CM

## 2019-05-12 DIAGNOSIS — C50512 Malignant neoplasm of lower-outer quadrant of left female breast: Secondary | ICD-10-CM

## 2019-05-12 DIAGNOSIS — Z170 Estrogen receptor positive status [ER+]: Secondary | ICD-10-CM

## 2019-05-12 MED ORDER — MORPHINE SULFATE ER 15 MG PO TBCR: 15.0000 mg | EXTENDED_RELEASE_TABLET | Freq: Three times a day (TID) | ORAL | 0 refills | Status: DC

## 2019-05-12 MED ORDER — LORAZEPAM 1 MG PO TABS: ORAL_TABLET | ORAL | 0 refills | Status: DC

## 2019-05-12 MED ORDER — DEXAMETHASONE 2 MG PO TABS: 4.0000 mg | ORAL_TABLET | Freq: Every morning | ORAL | 1 refills | Status: DC

## 2019-05-12 MED ORDER — GADOBUTROL 1 MMOL/ML IV SOLN: 7.0000 mL | Freq: Once | INTRAVENOUS | Status: DC | PRN

## 2019-05-12 MED ORDER — GADOBUTROL 1 MMOL/ML IV SOLN
7.0000 mL | Freq: Once | INTRAVENOUS | Status: AC | PRN
  Administered 2019-05-12: 7 mL via INTRAVENOUS

## 2019-05-12 MED ORDER — DULOXETINE HCL 30 MG PO CPEP: 30.0000 mg | ORAL_CAPSULE | Freq: Every day | ORAL | 0 refills | Status: AC

## 2019-05-12 NOTE — Telephone Encounter (Signed)
This RN spoke with pt per her call stating pain is not improving with increased use of short acting pain medication.  Per review with MD - plan is for the patient to get STAT MRI to evaluate for cord compression.  Able to schedule at [facility] for 1 pm ( pt may have to wait due to being worked in).  Note pt has known claustrophobia and has used valium in the past with no benefit.  This RN discussed regimen for pain and need for antianxiety.  Pt is to take antinausea med now. Then take MS Contin 15 mg 45 minutes later.  She is to take 1mg  ativan ASAP upon getting it from the pharmacy.  She is to repeat the ativan on her way to [facility] for the MRI.  She is to take with her the ativan and her short acting pain med to use if needed for MRI.  Caitlin Wall verbalized understanding as well as verifying she has a driver.  She will come to this office post MRI for discussion and further recommendations.

## 2019-05-12 NOTE — Progress Notes (Addendum)
[place]  [phone] [phone]    ID: Caitlin Wall DOB: Jul 24, 1955  MR#: [id number]  YPP#:[id number]  Patient Care Team: Biagio Borg, MD as PCP - General Tamala Julian Lynnell Dike, MD as PCP - Cardiology (Cardiology) Magrinat, Virgie Dad, MD as Consulting Physician (Oncology) Kyung Rudd, MD as Consulting Physician (Radiation Oncology) Jovita Kussmaul, MD as Consulting Physician (General Surgery) Sylvan Cheese, NP as Nurse Practitioner (Hematology and Oncology) Tanda Rockers, MD as Consulting Physician (Pulmonary Disease) Newton Pigg, MD as Consulting Physician (Obstetrics and Gynecology) OTHER MD: Keturah Barre MD   CHIEF COMPLAINT: Metastatic estrogen receptor positive breast cancer  CURRENT TREATMENT: Denosumab/Xgeva, fluvestrant, palbociclib   INTERVAL HISTORY: Caitlin Wall returns today for follow-up and treatment of Caitlin Wall metastatic estrogen receptor positive breast cancer.   Caitlin Wall continues on denosumab/Xgeva.    Caitlin Wall was recently taken off of Fulvestrant and Palbociclib due to progression of Caitlin Wall breast cancer.  Caitlin Wall was due to start Capecitabine at the end of this month.  Over the past week Caitlin Wall has been having right lateral back pain that causes a hot numbness to shoot down the right leg and numbness in Caitlin Wall foot and leg.  Caitlin Wall notes this is constant, and the MS Contin and immediate release morphine helps somehwat.  Standing makes it worse.    REVIEW OF SYSTEMS: Caitlin Wall is feeling poorly.  Caitlin Wall couldn't undergo liver biopsy yesterday due to this pain.  Caitlin Wall instead will do this on 3/23.  Caitlin Wall notes that Caitlin Wall is constipated and Caitlin Wall most recent bowel movement was about 5 days ago.  Caitlin Wall has had an occasional headache and nausea and Caitlin Wall says that the morphine helps with this as well.  Caitlin Wall denies any fever or chills.  Caitlin Wall has no bowel/bladder incontinence.  A detailed ROS was otherwise non contributory.    BREAST CANCER HISTORY:  From the original  intake note:   Caitlin Wall [facility]. Caitlin Wall has subpectoral saline implants in place. There was a possible asymmetry in the left breast and Caitlin Wall was recalled for left diagnostic mammography with tomosynthesis and left breast ultrasonography 11/03/2014. The breast density was category C. In the left breast lower outer quadrant there was an irregular mass which was not palpable and which by ultrasonography measured 0.6 cm. The left axilla was sonographically benign. Biopsy of this mass 11/09/2014 showed ([id number]) fibroadipose adipose tissue. This was felt to be discordant.  Accordingly the patient was referred to surgery and Caitlin Wall underwent left breast radioactive seed localized lumpectomy 12/28/2014. The pathology from that procedure (([id number]) showed ductal carcinoma in situ, high-grade, measuring 0.2 cm. This was less than 0.1 cm from the posterior margin. The cells was estrogen receptor positive at 95%, with strong staining intensity, progesterone receptor positive at 30% with moderate staining intensity.  Caitlin Wall subsequent history is as detailed below.   PAST MEDICAL HISTORY: Past Medical History:  Diagnosis Date  . Acute asthma 07/27/2012  . Breast cancer ([facility]) 12/28/14   Left BresastDCIS  . COLONIC POLYPS, HX OF 11/04/2007  . DVT (deep venous thrombosis) ([facility]) 2018   left leg  . GERD 11/04/2007  . GERD (gastroesophageal reflux disease)   . HAIR LOSS 11/04/2007  . Hearing loss    bilateral - wears hearing aids  . Hx of cardiac cath 11/15/2016   a. LHC 11/15/16 showed normal coronaries and EF 50-55% with normal EDP. (done for false positive NST)  . Hx  of colonic polyps 1995   adenomatous  . Hyperlipidemia   . HYPERLIPIDEMIA 11/04/2007  . Hypertension   . HYPERTENSION 06/07/2009  . LBBB (left bundle branch block) 07/26/2011   no current problems  . Personal history of radiation therapy   . S/P radiation therapy 02/02/15-03/24/15   left  breast 60.4GY    PAST SURGICAL HISTORY: Past Surgical History:  Procedure Laterality Date  . ABDOMINAL HYSTERECTOMY     partial  . ANTERIOR FUSION CERVICAL SPINE    . bilat ear surgury/hearing loss     wears hearing aids  . BREAST LUMPECTOMY Left 2016  . BREAST LUMPECTOMY WITH RADIOACTIVE SEED LOCALIZATION Left 12/28/2014   Procedure: BREAST LUMPECTOMY WITH RADIOACTIVE SEED LOCALIZATION;  Surgeon: Autumn Messing III, MD;  Location: [facility];  Service: General;  Laterality: Left;  . COLONOSCOPY    . LEFT HEART CATH AND CORONARY ANGIOGRAPHY N/A 11/15/2016   Procedure: LEFT HEART CATH AND CORONARY ANGIOGRAPHY;  Surgeon: Belva Crome, MD;  Location: [facility];  Service: Cardiovascular;  Laterality: N/A;  . POLYPECTOMY    . UPPER GASTROINTESTINAL ENDOSCOPY    . VULVECTOMY PARTIAL N/A 02/02/2019   Procedure: VULVECTOMY PARTIAL;  Surgeon: Newton Pigg, MD;  Location: [facility];  Service: Gynecology;  Laterality: N/A;  . WISDOM TOOTH EXTRACTION      FAMILY HISTORY: Family History  Problem Relation Age of Onset  . Breast cancer Other   . Cancer Paternal Grandmother        breast  . Lung cancer Father   . Breast cancer Maternal Grandmother   . Healthy Mother   . Coronary artery disease Neg Hx   . Colon cancer Neg Hx   . Pancreatic cancer Neg Hx   . Rectal cancer Neg Hx   . Stomach cancer Neg Hx   . Esophageal cancer Neg Hx    [name] died from lung cancer in the setting of tobacco abuse at age 21. [name] died from "old age" at age 69. The patient had one brother, 3 sisters. The brother had" I cancer" requiring enucleation--the patient does not know whether this was a primary ocular melanoma. [name] was diagnosed with breast cancer but the patient does not know at what age. There is no other history of breast or ovarian cancer in the family   GYNECOLOGIC HISTORY:  No LMP recorded (lmp  unknown). Patient has had a hysterectomy. Menarche age 12, first live birth age 67. The patient is GX P3. Caitlin Wall is status post hysterectomy without salpingo-oophorectomy. Caitlin Wall did not take hormone replacement. Caitlin Wall did use oral contraceptives remotely without any complications.   SOCIAL HISTORY:  Caitlin Wall works in accounts payable. Caitlin Wall husband [name] is disabled because of back problems. [name] is a Marine scientist working in rehabilitation in [place]. [name] lives in [place] where he works as a Games developer. [name] lives in [place] where he works as a Emergency planning/management officer. The patient has 4 grandchildren. Caitlin Wall is not a Ambulance person.    ADVANCED DIRECTIVES: Not in place   HEALTH MAINTENANCE: Social History   Tobacco Use  . Smoking status: Former Smoker    Packs/day: 1.00    Years: 20.00    Pack years: 20.00    Types: Cigarettes    Quit date: 08/19/1989    Years since quitting: 29.7  . Smokeless tobacco: Never Used  Substance Use Topics  . Alcohol use: Yes    Comment:  rare  . Drug use: No    Colonoscopy: 10/2018, repeat 2027 (Dr. Fuller Plan)  PAP: s/p hysterectomy  Bone density: 02/2018, -1.7  Allergies  Allergen Reactions  . Codeine     REACTION: nausea,  . Tramadol Hcl Nausea Only    Current Outpatient Medications  Medication Sig Dispense Refill  . aspirin EC 81 MG tablet Take 81 mg by mouth daily.    . budesonide-formoterol (SYMBICORT) 160-4.5 MCG/ACT inhaler Inhale 2 puffs into the lungs 2 (two) times daily. 1 Inhaler 3  . capecitabine (XELODA) 500 MG tablet Take 3 tablets (1,500 mg total) by mouth 2 (two) times daily after a meal. Take for 14 days, then do not take for 7 days, then repeat. 84 tablet 4  . cholecalciferol (VITAMIN D3) 25 MCG (1000 UT) tablet Take 1 tablet (1,000 Units total) by mouth daily.    Marland Kitchen dexamethasone (DECADRON) 2 MG tablet Take 1 tablet (2 mg total) by mouth in the morning. 30 tablet 1  . Ipratropium-Albuterol (COMBIVENT) 20-100 MCG/ACT  AERS respimat Inhale 1 puff into the lungs every 6 (six) hours. 4 g 3  . LORazepam (ATIVAN) 1 MG tablet Take 1 tab ASAP, repeat 1 hour prior to scan and if needed repeat for known claustrophobia for MRI today 10 tablet 0  . morphine (MS CONTIN) 15 MG 12 hr tablet Take 1 tablet (15 mg total) by mouth every 12 (twelve) hours. 60 tablet 0  . morphine 10 MG/5ML solution Take 1.3-2.5 mLs (2.6-5 mg total) by mouth every 6 (six) hours as needed for severe pain. 30 mL 0  . ondansetron (ZOFRAN) 4 MG tablet Take 1 tablet (4 mg total) by mouth 3 (three) times daily before meals. 60 tablet 4  . pantoprazole (PROTONIX) 40 MG tablet Take 1 tablet (40 mg total) by mouth daily. 90 tablet 4   No current facility-administered medications for this visit.   Facility-Administered Medications Ordered in Other Visits  Medication Dose Route Frequency Provider Last Rate Last Admin  . gadobutrol (GADAVIST) 1 MMOL/ML injection 7 mL  7 mL Intravenous Once PRN Magrinat, Virgie Dad, MD        OBJECTIVE: Middle-aged white woma who appears stated age  Vitals:   05/12/19 1512  BP: 112/70  Pulse: (!) 104  Resp: 18  Temp: 98.2 F (36.8 C)  SpO2: 96%     Body mass index is 26.69 kg/m.    ECOG FS:1 - Symptomatic but completely ambulatory  Filed Weights   05/12/19 1512  Weight: 160 lb 6.4 oz (72.8 kg)    GENERAL: Patient is a tired appearing older woman examined in wheelchair, in obvious pain HEENT:  Sclerae anicteric. Mask in place.  Neck is supple.  NODES:  No cervical, supraclavicular, or axillary lymphadenopathy palpated.  BREAST EXAM:  Deferred. LUNGS:  Clear to auscultation bilaterally.  No wheezes or rhonchi. HEART:  Regular rate and rhythm. No murmur appreciated. ABDOMEN:  Soft, nontender.  Positive, normoactive bowel sounds. No organomegaly palpated. EXTREMITIES:  No peripheral edema.   SKIN:  Clear with no obvious rashes or skin changes. No nail dyscrasia. NEURO:  Nonfocal. Well oriented.  Appropriate  affect.     LAB RESULTS:  CMP     Component Value Date/Time   NA 142 05/03/2019 0959   NA 141 04/25/2017 1213   NA 145 07/04/2016 1133   K 3.9 05/03/2019 0959   K 4.2 07/04/2016 1133   CL 101 05/03/2019 0959   CO2 29 05/03/2019 0959  CO2 28 07/04/2016 1133   GLUCOSE 118 (H) 05/03/2019 0959   GLUCOSE 101 07/04/2016 1133   BUN 14 05/03/2019 0959   BUN 18 04/25/2017 1213   BUN 16.0 07/04/2016 1133   CREATININE 1.25 (H) 05/03/2019 0959   CREATININE 1.09 (H) 07/23/2018 1339   CREATININE 0.9 07/04/2016 1133   CALCIUM 9.1 05/03/2019 0959   CALCIUM 9.9 07/04/2016 1133   PROT 7.0 05/03/2019 0959   PROT 7.3 07/04/2016 1133   ALBUMIN 2.6 (L) 05/03/2019 0959   ALBUMIN 3.8 07/04/2016 1133   AST 177 (HH) 05/03/2019 0959   AST 35 07/23/2018 1339   AST 19 07/04/2016 1133   ALT 45 (H) 05/03/2019 0959   ALT 25 07/23/2018 1339   ALT 17 07/04/2016 1133   ALKPHOS 283 (H) 05/03/2019 0959   ALKPHOS 172 (H) 07/04/2016 1133   BILITOT 1.2 05/03/2019 0959   BILITOT 1.1 07/23/2018 1339   BILITOT 0.94 07/04/2016 1133   GFRNONAA 46 (L) 05/03/2019 0959   GFRNONAA 55 (L) 07/23/2018 1339   GFRAA 53 (L) 05/03/2019 0959   GFRAA >60 07/23/2018 1339    INo results found for: SPEP, UPEP  Lab Results  Component Value Date   WBC 11.9 (H) 05/03/2019   NEUTROABS 9.6 (H) 05/03/2019   HGB 11.3 (L) 05/03/2019   HCT 34.3 (L) 05/03/2019   MCV 106.2 (H) 05/03/2019   PLT 327 05/03/2019      Chemistry      Component Value Date/Time   NA 142 05/03/2019 0959   NA 141 04/25/2017 1213   NA 145 07/04/2016 1133   K 3.9 05/03/2019 0959   K 4.2 07/04/2016 1133   CL 101 05/03/2019 0959   CO2 29 05/03/2019 0959   CO2 28 07/04/2016 1133   BUN 14 05/03/2019 0959   BUN 18 04/25/2017 1213   BUN 16.0 07/04/2016 1133   CREATININE 1.25 (H) 05/03/2019 0959   CREATININE 1.09 (H) 07/23/2018 1339   CREATININE 0.9 07/04/2016 1133      Component Value Date/Time   CALCIUM 9.1 05/03/2019 0959   CALCIUM 9.9  07/04/2016 1133   ALKPHOS 283 (H) 05/03/2019 0959   ALKPHOS 172 (H) 07/04/2016 1133   AST 177 (HH) 05/03/2019 0959   AST 35 07/23/2018 1339   AST 19 07/04/2016 1133   ALT 45 (H) 05/03/2019 0959   ALT 25 07/23/2018 1339   ALT 17 07/04/2016 1133   BILITOT 1.2 05/03/2019 0959   BILITOT 1.1 07/23/2018 1339   BILITOT 0.94 07/04/2016 1133     No results found for: LABCA2  No components found for: LABCA125  No results for input(s): INR in the last 168 hours.  Urinalysis    Component Value Date/Time   COLORURINE YELLOW 07/20/2018 1747   APPEARANCEUR CLEAR 07/20/2018 1747   LABSPEC 1.019 07/20/2018 1747   PHURINE 5.0 07/20/2018 1747   GLUCOSEU NEGATIVE 07/20/2018 1747   GLUCOSEU NEGATIVE 10/20/2017 1303   HGBUR NEGATIVE 07/20/2018 1747   BILIRUBINUR NEGATIVE 07/20/2018 1747   KETONESUR 20 (A) 07/20/2018 1747   PROTEINUR NEGATIVE 07/20/2018 1747   UROBILINOGEN 1.0 10/20/2017 1303   NITRITE NEGATIVE 07/20/2018 1747   LEUKOCYTESUR NEGATIVE 07/20/2018 1747    STUDIES: DG Chest 2 View  Result Date: 04/23/2019 CLINICAL DATA:  64 year old female with history of severe shortness of breath. EXAM: CHEST - 2 VIEW COMPARISON:  Chest x-ray 05/13/2018. FINDINGS: Emphysematous changes in the lungs bilaterally. Irregular opacities are noted in the area of the lower lobes bilaterally, new compared to  the prior chest x-ray, but corresponding to areas seen on prior chest CT 11/11/2018. No pleural effusions. No evidence of pulmonary edema. Heart size is normal. Upper mediastinal contours are within normal limits. Aortic atherosclerosis. IMPRESSION: 1. As seen on prior chest CT 11/11/2018, there are regular opacities in the medial aspects of the lower lobes of the lungs bilaterally, which are likely sequela of prior radiation therapy. No other acute findings are noted on today's examination. 2. Aortic atherosclerosis. Electronically Signed   By: Vinnie Langton M.D.   On: 04/23/2019 10:11   CT Chest Wo  Contrast  Result Date: 04/30/2019 CLINICAL DATA:  Left breast cancer.  Restaging. EXAM: CT CHEST WITHOUT CONTRAST TECHNIQUE: Multidetector CT imaging of the chest was performed following the standard protocol without IV contrast. COMPARISON:  CT chest 11/11/2018 FINDINGS: Cardiovascular: The heart size appears within normal limits. No pericardial effusion. Aortic atherosclerosis. Mediastinum/Nodes: No enlarged mediastinal or axillary lymph nodes. Thyroid gland, trachea, and esophagus demonstrate no significant findings. Lungs/Pleura: No pleural effusion. Advanced changes of paraseptal and centrilobular emphysema. Bilateral lower lobe paravertebral fibrosis and masslike architectural distortion noted compatible with progressive changes of external beam radiation. Small nonspecific nodule within the posterior left lower lobe is new from previous exam measuring 4 mm, image 97/5. Upper Abdomen: Interval development of multifocal areas of low attenuation involving both lobes of liver concerning for metastatic disease. Index lesion within right hepatic lobe measures 2.5 cm, image 117/2. Index lesion within segment 5 measures 3.9 cm, image 151/2. Index lesion in lateral segment of left lobe of liver measures 3.6 cm, image 128/2. Musculoskeletal: Multifocal sclerotic metastases identified within the bony thorax. This is similar to previous exam. IMPRESSION: 1. Interval development of multifocal low-attenuation lesions within the liver worrisome for metastatic disease. More definitive characterization with contrast enhanced liver protocol MRI or CT advised. 2. Progressive changes of external beam radiation within the paravertebral lower lobes. 3. New small nonspecific lung nodule within the left lower lobe measures 4 mm. Attention on follow-up imaging. 4. Similar appearance of multifocal sclerotic bone metastases. Aortic Atherosclerosis (ICD10-I70.0) and Emphysema (ICD10-J43.9). Electronically Signed   By: Kerby Moors  M.D.   On: 04/30/2019 09:38   NM Bone Scan Whole Body  Result Date: 04/30/2019 CLINICAL DATA:  Metastatic breast cancer. EXAM: NUCLEAR MEDICINE WHOLE BODY BONE SCAN TECHNIQUE: Whole body anterior and posterior images were obtained approximately 3 hours after intravenous injection of radiopharmaceutical. RADIOPHARMACEUTICALS:  22.0 mCi Technetium-30mMDP IV COMPARISON:  November 11, 2018. FINDINGS: There has been interval increased number of bilateral rib abnormally increased foci of radiotracer uptake as well as new areas of radiotracer uptake in the thoracolumbar and sacral spine and ischia, and probably in the bilateral skull. IMPRESSION: Interval progression of osteoblastic metastatic disease in the axial skeleton and ribcage, and probably the skull. MR brain without and with intravenous contrast recommended. Electronically Signed   By: RRevonda Humphrey  On: 04/30/2019 21:07   NM Pulmonary Perf and Vent  Result Date: 04/23/2019 CLINICAL DATA:  Short of breath.  Asthma, COPD. EXAM: NUCLEAR MEDICINE VENTILATION - PERFUSION LUNG SCAN TECHNIQUE: Ventilation images were obtained in multiple projections using inhaled aerosol Tc-947mTPA. Perfusion images were obtained in multiple projections after intravenous injection of Tc-9925mA. RADIOPHARMACEUTICALS:  1.4 mCi of Tc-49m54mA aerosol inhalation and 1.4 mCi Tc49m 38mIV COMPARISON:  CT 11/16/2018, chest radiograph 04/23/2019 FINDINGS: Ventilation: Decreased ventilation in the lateral RIGHT upper lobe. Decreased ventilation the medial aspect of the LEFT lower lobe on  posterior projection. Perfusion: Perfusion defect in the lateral aspect of the RIGHT upper lobe matches the ventilation defect. Additionally there is a focal emphysema in the lateral aspect the RIGHT upper lobe corresponding to this perfusion defect. Decreased perfusion to the medial LEFT lower lobe also matches the ventilation defect and there is emphysema / bronchiectasis on comparison CT.  IMPRESSION: 1. Matched perfusion ventilation defects correspond to focal emphysema on comparison CT. 2. No evidence acute pulmonary embolism. Electronically Signed   By: Suzy Bouchard M.D.   On: 04/23/2019 15:43     ASSESSMENT: 63 y.o. [place] woman status post left lumpectomy 12/28/2014 for ductal carcinoma in situ, high-grade measuring 0.2 cm, estrogen and progesterone receptor positive, with close but negative margins  (1) adjuvant radiation 02/02/2015 through 03/24/2015: The patient initially received a dose of 50.4 Gy in 28 fractions to the breast using whole-breast tangent fields. This was delivered using a 3-D conformal technique. The patient then received a boost to the seroma. This delivered an additional 10 Gy in 5 fractions using a en face electron field. The total dose was 60.4 Gy.   (2) anastrozole started 04/19/2015   (a) bone density 03/01/2016 shows a T score of -1.2  (3) mildly abnormal hepatic function panel: negative workup for hepatitis B and C, normal alpha-1-anti-trypsin  (4) left lower extremity DVT diagnosed by Doppler ultrasonography 04/16/2016, with negative CT angiogram chest  (a) on Xarelto starting 04/16/2016   (b) negative hypercoagulable panel, with a minimally abnormal (indeterminate) anti-cardiolipin IgM  ((c) stopped rivaroxaban 07/16/2016, repeat D-Dimer WNL August 2018  METASTATIC DISEASE: December 2019: bone only (5) CT chest 02/06/2018 shows a mixed lytic and sclerotic process with fracture at the inferior manubrium but no other bone lesions.  However bone scan 02/16/2018 shows multiple bone lesions consistent with metastatic disease; no lung or pleura involvement, no adenopathy  (a) Bone marrow biopsy on 03/03/2018 shows metastatic carcinoma consistent with breast primary, estrogen and progesterone receptor strongly positive, Caitlin Wall-2 not amplified (1+)  (b) MRI abdomen and MRI brain at [facility] 03/17/2018 (results viewable in "care  everywhere") notes known bone involvement in spine and pelvis, but no other area of metastatic disease in abdomen/pelvis, or brain.  (c) CA 27.29 not informative (37.9 on 03/03/2018)  (6) started denosumab/Xgeva on 03/18/2018, repeated Q28 d  (7) started fulvestrant on 03/03/2018 and palbociclib 03/18/2018 at 125 mg/d 21/7  (a) bone scan 06/10/2018 serves as our new baseline  (b) chest CT scan on 11/11/2018 shows multiple sclerotic bone lesions (treatment effect) no visceral lesions; lungs also show radiation change  (c) CT angio 11/16/2018 shows no evidence of pulmonary embolism  (d) V/Q scan 04/23/2019 read as low probability  (e) fulvestrant and palbiciclib discontinued with disease progression (bone and liver)  (8) palliative radiation 07/01/2018-07/14/2018:  The patient received 30 Gy in 10 fractions to bilateral ribs and lower thoracic spine  (a) esophagitis secondary to radiation has resolved  (9) pain management/bowel prophylaxis  (a) currently on MS Contin 15 mg twice daily with morphine liquid 10 mg and 5 cc as needed  (b) bowel prophylaxis  (10) to start capecitabine  (a) chest CT scan shows new liver lesions, bone scan shows new bone lesions 04/30/2019   PLAN: Caitlin Wall is in significant pain, and this appears to be related to Caitlin Wall cancer in Caitlin Wall back.  We reviewed Caitlin Wall MRI results with Caitlin Wall which show cancer in the spine, and in the lower back that may be causing the radicular pain Caitlin Wall  is describing.  Caitlin Wall has no cord compression.    Caitlin Wall met with myself and Dr. Jana Hakim.  Caitlin Wall pain is not controlled.  Caitlin Wall was recommended to take MS contin 30m Q8h.  I also sent a message to radiation oncology to evaluate whether or not Caitlin Wall can receive radiation to the S1 area. We also increased Caitlin Wall dexamethasone to 48mdaily and added cymbalta 3037maily for Caitlin Wall pain.  I gave Caitlin Wall detailed information about these changes in Caitlin Wall AVS.  Caitlin Wall knows that if Caitlin Wall has any increased side effects such as increased  somnolence, to please let us [place].  Caitlin Wall knows that our goal in pain management is to make Caitlin Wall more functional.  Caitlin Wall is in agreement with this.    Caitlin Wall recommended to take senokot s 2 tabs BID, miralax daily, and magnesium citrate once today.  We reviewed that Caitlin Wall should have a bowel movement every 3 days.  Caitlin Wall was recommended to call us [place] any questions or concerns.    Caitlin Wall return on 05/18/2019 for Caitlin Wall liver biopsy and on 05/21/2019 for Caitlin Wall appointment with Dr. MagJana HakimShe was recommended to continue with the appropriate pandemic precautions. Caitlin Wall knows to call for any questions that may arise between now and Caitlin Wall next appointment.  We are happy to see Caitlin Wall sooner if needed.   Total encounter time 30 minutes.   LinWilber BihariP 05/12/19 3:22 PM Medical Oncology and Hematology [id number]. [phone] Fax. [phone] *Total Encounter Time as defined by the Centers for Medicare and Medicaid Services includes, in addition to the face-to-face time of a patient visit (documented in the note above) non-face-to-face time: obtaining and reviewing outside history, ordering and reviewing medications, tests or procedures, care coordination (communications with other health care professionals or caregivers) and documentation in the medical record.   ADDENDUM: We have not been able to make good headway on pain control for Caitlin Wall.  50 mg of MS Contin twice daily seem to be working but Caitlin Wall complained of nausea and feeling a little sleepy with the morning dose.  We tried to keep the evening dose but do repeated rapid acting morphine during the day and that did not work.  Apparently Caitlin Wall only took it 1 time.  In the meantime Caitlin Wall is also getting constipation which is not well controlled and having nausea as well.  All this is overwhelming to JanAtalissaI can understand why Caitlin Wall son wanted hospice to become involved.  I think it would be  useful if Caitlin Wall husband and or son could be with Caitlin Wall at the next visit so they could better participate in Caitlin Wall care and help Caitlin Wall get where Caitlin Wall needs to be if Caitlin Wall is going to receive more treatment.  Problem #1 #2 on #3 is to get the pain under control.  We are going to increase the MS Contin to 15 3 times a day.  We also wrote out a bowel prophylaxis for Caitlin Wall.  Caitlin Wall is already scheduled for liver biopsy next Tuesday and to see us Kore[place] that week.  Hopefully by then the pain at least will be better controlled and we can make decisions regarding which way to go with treatment.  I personally saw this patient and performed a substantive portion of this encounter with the listed APP documented above.   GusChauncey CruelD Medical Oncology and Hematology [facility]  [place], [place] [address] Tel. [phone]    Fax. [phone]

## 2019-05-13 ENCOUNTER — Ambulatory Visit
Admission: RE | Admit: 2019-05-13 | Discharge: 2019-05-13 | Disposition: A | Discharge status: Home / Self Care | Payer: BC Managed Care – PPO | Source: Ambulatory Visit | Attending: Radiation Oncology | Admitting: Radiation Oncology

## 2019-05-13 ENCOUNTER — Encounter: Payer: Self-pay | Admitting: Radiation Oncology

## 2019-05-13 ENCOUNTER — Other Ambulatory Visit: Payer: Self-pay

## 2019-05-13 ENCOUNTER — Telehealth: Payer: Self-pay

## 2019-05-13 VITALS — BP 132/81 | HR 96 | Temp 98.5°F | Resp 20 | Wt 158.0 lb

## 2019-05-13 DIAGNOSIS — Z170 Estrogen receptor positive status [ER+]: Secondary | ICD-10-CM | POA: Diagnosis not present

## 2019-05-13 DIAGNOSIS — C7951 Secondary malignant neoplasm of bone: Secondary | ICD-10-CM

## 2019-05-13 DIAGNOSIS — Z510 Encounter for antineoplastic radiation therapy: Secondary | ICD-10-CM | POA: Insufficient documentation

## 2019-05-13 DIAGNOSIS — G893 Neoplasm related pain (acute) (chronic): Secondary | ICD-10-CM

## 2019-05-13 DIAGNOSIS — C50512 Malignant neoplasm of lower-outer quadrant of left female breast: Secondary | ICD-10-CM | POA: Diagnosis not present

## 2019-05-13 MED ORDER — HYDROMORPHONE HCL 1 MG/ML IJ SOLN
1.0000 mg | Freq: Once | INTRAMUSCULAR | Status: AC
  Administered 2019-05-13: 1 mg via INTRAMUSCULAR
  Filled 2019-05-13: qty 1

## 2019-05-13 NOTE — Telephone Encounter (Signed)
Appointment notes state that patient had an in person appointment. Provider Shona Simpson previously wanted her in person. Appointment was changed to a telephone encounter. Made Shona Simpson aware.

## 2019-05-13 NOTE — Progress Notes (Signed)
Reviewed allergies with patient. Patient confirms she has tolerated dilaudid without issue. Also, patient reports codeine caused only itching and tramadol nausea.

## 2019-05-14 ENCOUNTER — Other Ambulatory Visit: Payer: Self-pay | Admitting: Oncology

## 2019-05-14 ENCOUNTER — Telehealth: Payer: Self-pay | Admitting: Adult Health

## 2019-05-14 ENCOUNTER — Telehealth: Payer: Self-pay | Admitting: Pharmacist

## 2019-05-14 MED ORDER — MORPHINE SULFATE 10 MG/5ML PO SOLN: 5.0000 mg | ORAL | 0 refills | Status: DC | PRN

## 2019-05-14 NOTE — Telephone Encounter (Signed)
Checked pt out. No changes made to schedule. No 3/17 los.

## 2019-05-14 NOTE — Telephone Encounter (Signed)
Oral Chemotherapy Pharmacist EuMsncounter  Caitlin Wall plans on picking on her Xeloda from [place] next week. She knows the plan is to start once Dr. Jana Hakim gives her the go ahead.  Patient Education I spoke with patient for overview of new oral chemotherapy medication: Xeloda (capecitabine) for the treatment of metastatic breast cancer, planned duration until disease progression or unacceptable drug toxicity. Planned start 05/25/19.   Counseled patient on administration, dosing, side effects, monitoring, drug-food interactions, safe handling, storage, and disposal. Patient will take 3 tablets (1,500 mg total) by mouth 2 (two) times daily after a meal. Take for 14 days, then do not take for 7 days, then repeat.  Reviewed DDI with pantoprazole with Caitlin Wall she stated that she only takes her pantoprazole occasionally. She was willing to stop the pantoprazole and use famotidine or calcium carbonate instead while on capecitabine if needed.   Side effects include but not limited to: diarrhea, hand-foot syndrome, N/V, fatigue, decreased wbc.    Reviewed with patient importance of keeping a medication schedule and plan for any missed doses.  Caitlin Wall voiced understanding and appreciation. All questions answered. Medication handout placed in the mail.  Provided patient with [facility] phone number. Patient knows to call the office with questions or concerns. Oral Chemotherapy Navigation [facility] will continue to follow.  Darl Pikes, PharmD, BCPS, BCOP, CPP Hematology/Oncology Clinical Pharmacist [facility]/AP [facility] [phone]  05/14/2019 2:38 PM

## 2019-05-14 NOTE — Progress Notes (Signed)
Morphine

## 2019-05-15 NOTE — Progress Notes (Addendum)
Radiation Oncology         [phone] ________________________________  Name: Caitlin Wall        MRN: [id number]  Date of Service: 05/13/2019 DOB: [birth year]  [id number], Hunt Oris, MD  Wilber Bihari Cornett*     REFERRING PHYSICIAN: Wilber Bihari Cornett*   DIAGNOSIS: The primary encounter diagnosis was Bone metastases ([place]). Diagnoses of Pain from bone metastases ([facility]) and Malignant neoplasm of lower-outer quadrant of left breast of female, estrogen receptor positive ([place]) were also pertinent to this visit.   HISTORY OF PRESENT ILLNESS: Caitlin Wall is a 64 y.o. female seen at the request of Dr. Jana Hakim and Wilber Bihari, NP with a history of metastatic breast cancer. The patient has been followed by medical oncology since her original diagnosis in 2016. She was diagnosed with metastatic disease in 2019, and has received palliative radiotherapy to the ribs and lower thoracic spine in 2020. She had a recent CT chest showing new liver lesions, and progressive bone disease. When she was seen on 05/12/19, she was complaining of new low back pain and an MRI was recommended. She completed a total spine MRI on 05/12/19 which revealed widespread osseous metastatic disease within the entire spine, without extraosseous tumor extension, fracture or neural metastasis, but she did have more bulky disease in the sacrum and into the right S1 foramen. She's seen today to discuss options of palliative radiotherapy.  PREVIOUS RADIATION THERAPY: Yes   07/01/2018-07/14/2018:  The patient received 30 Gy in 10 fractions to bilateral ribs and lower thoracic spine  02/02/2015 through 03/24/2015: The patient initially received a dose of 50.4 Gy in 28 fractions to the left breast using whole-breast tangent fields. This was delivered using a 3-D conformal technique. The patient then received a boost to the seroma. This delivered an additional 10 Gy in 5 fractions using a en face electron field. The  total dose was 60.4 Gy.   PAST MEDICAL HISTORY:  Past Medical History:  Diagnosis Date  . Acute asthma 07/27/2012  . Breast cancer ([place]) 12/28/14   Left BresastDCIS  . COLONIC POLYPS, HX OF 11/04/2007  . DVT (deep venous thrombosis) ([place]) 2018   left leg  . GERD 11/04/2007  . GERD (gastroesophageal reflux disease)   . HAIR LOSS 11/04/2007  . Hearing loss    bilateral - wears hearing aids  . Hx of cardiac cath 11/15/2016   a. LHC 11/15/16 showed normal coronaries and EF 50-55% with normal EDP. (done for false positive NST)  . Hx of colonic polyps 1995   adenomatous  . Hyperlipidemia   . HYPERLIPIDEMIA 11/04/2007  . Hypertension   . HYPERTENSION 06/07/2009  . LBBB (left bundle branch block) 07/26/2011   no current problems  . Personal history of radiation therapy   . S/P radiation therapy 02/02/15-03/24/15   left breast 60.4GY       PAST SURGICAL HISTORY: Past Surgical History:  Procedure Laterality Date  . ABDOMINAL HYSTERECTOMY     partial  . ANTERIOR FUSION CERVICAL SPINE    . bilat ear surgury/hearing loss     wears hearing aids  . BREAST LUMPECTOMY Left 2016  . BREAST LUMPECTOMY WITH RADIOACTIVE SEED LOCALIZATION Left 12/28/2014   Procedure: BREAST LUMPECTOMY WITH RADIOACTIVE SEED LOCALIZATION;  Surgeon: Autumn Messing III, MD;  Location: [facility];  Service: General;  Laterality: Left;  . COLONOSCOPY    . LEFT HEART CATH AND CORONARY ANGIOGRAPHY N/A 11/15/2016   Procedure: LEFT HEART CATH AND CORONARY  ANGIOGRAPHY;  Surgeon: Belva Crome, MD;  Location: [facility];  Service: Cardiovascular;  Laterality: N/A;  . POLYPECTOMY    . UPPER GASTROINTESTINAL ENDOSCOPY    . VULVECTOMY PARTIAL N/A 02/02/2019   Procedure: VULVECTOMY PARTIAL;  Surgeon: Newton Pigg, MD;  Location: [facility];  Service: Gynecology;  Laterality: N/A;  . WISDOM TOOTH EXTRACTION       FAMILY HISTORY:  Family History  Problem Relation Age of Onset  . Breast cancer  Other   . Cancer Paternal Grandmother        breast  . Lung cancer Father   . Breast cancer Maternal Grandmother   . Healthy Mother   . Coronary artery disease Neg Hx   . Colon cancer Neg Hx   . Pancreatic cancer Neg Hx   . Rectal cancer Neg Hx   . Stomach cancer Neg Hx   . Esophageal cancer Neg Hx      SOCIAL HISTORY:  reports that she quit smoking about 29 years ago. Her smoking use included cigarettes. She has a 20.00 pack-year smoking history. She has never used smokeless tobacco. She reports current alcohol use. She reports that she does not use drugs.   ALLERGIES: Codeine and Tramadol hcl   MEDICATIONS:  Current Outpatient Medications  Medication Sig Dispense Refill  . aspirin EC 81 MG tablet Take 81 mg by mouth daily.    . budesonide-formoterol (SYMBICORT) 160-4.5 MCG/ACT inhaler Inhale 2 puffs into the lungs 2 (two) times daily. 1 Inhaler 3  . cholecalciferol (VITAMIN D3) 25 MCG (1000 UT) tablet Take 1 tablet (1,000 Units total) by mouth daily.    Marland Kitchen dexamethasone (DECADRON) 2 MG tablet Take 2 tablets (4 mg total) by mouth in the morning. 60 tablet 1  . Ipratropium-Albuterol (COMBIVENT) 20-100 MCG/ACT AERS respimat Inhale 1 puff into the lungs every 6 (six) hours. 4 g 3  . magnesium citrate SOLN Take 1 Bottle by mouth once. Take once today    . morphine (MS CONTIN) 15 MG 12 hr tablet Take 1 tablet (15 mg total) by mouth every 8 (eight) hours. 90 tablet 0  . morphine 10 MG/5ML solution Take 1.3-2.5 mLs (2.6-5 mg total) by mouth every 6 (six) hours as needed for severe pain. 30 mL 0  . ondansetron (ZOFRAN) 4 MG tablet Take 1 tablet (4 mg total) by mouth 3 (three) times daily before meals. 60 tablet 4  . pantoprazole (PROTONIX) 40 MG tablet Take 1 tablet (40 mg total) by mouth daily. 90 tablet 4  . polyethylene glycol (MIRALAX / GLYCOLAX) 17 g packet Take 17 g by mouth daily. To prevent constipation from pain medication    . capecitabine (XELODA) 500 MG tablet Take 3 tablets  (1,500 mg total) by mouth 2 (two) times daily after a meal. Take for 14 days, then do not take for 7 days, then repeat. (Patient not taking: Reported on 05/13/2019) 84 tablet 4  . DULoxetine (CYMBALTA) 30 MG capsule Take 1 capsule (30 mg total) by mouth daily. (Patient not taking: Reported on 05/13/2019) 30 capsule 0  . LORazepam (ATIVAN) 1 MG tablet Take 1 tab ASAP, repeat 1 hour prior to scan and if needed repeat for known claustrophobia for MRI today (Patient not taking: Reported on 05/13/2019) 10 tablet 0  . morphine 10 MG/5ML solution Take 2.5-5 mLs (5-10 mg total) by mouth every 4 (four) hours as needed for severe pain. 100 mL 0  . senna-docusate (SENOKOT-S) 8.6-50 MG tablet Take  2 tablets by mouth 2 (two) times daily. To prevent constipation from pain medication     No current facility-administered medications for this encounter.     REVIEW OF SYSTEMS: On review of systems, the patient reports that she is very uncomfortable and describes generalized pain from her mid back down her right buttock and down her left thigh and into her right toe and the top of her right foot. She describes persistent searing pain that shoots from her low back into the locations stated above. She reports she is not having trouble with loss of bowel or bladder function. She denies any loss of control or of sensation of her lower extremities or perineum. No other joint aches or pains are noted. No other complaints are verbalized.    PHYSICAL EXAM:  Wt Readings from Last 3 Encounters:  05/13/19 158 lb (71.7 kg)  05/12/19 160 lb 6.4 oz (72.8 kg)  05/03/19 165 lb 8 oz (75.1 kg)   Temp Readings from Last 3 Encounters:  05/13/19 98.5 F (36.9 C)  05/12/19 98.2 F (36.8 C)  05/03/19 98.1 F (36.7 C) (Temporal)   BP Readings from Last 3 Encounters:  05/13/19 132/81  05/12/19 112/70  05/03/19 (!) 102/55   Pulse Readings from Last 3 Encounters:  05/13/19 96  05/12/19 (!) 104  05/03/19 95   In general this is  a well appearing caucasian female in no acute distress. She's alert and oriented x4 and appropriate throughout the examination. Cardiopulmonary assessment is negative for acute distress and she exhibits normal effort. She has intact perception to light touch of the RLE, and 5/5 strength of the RLE.     ECOG = 3  0 - Asymptomatic (Fully active, able to carry on all predisease activities without restriction)  1 - Symptomatic but completely ambulatory (Restricted in physically strenuous activity but ambulatory and able to carry out work of a light or sedentary nature. For example, light housework, office work)  2 - Symptomatic, <50% in bed during the day (Ambulatory and capable of all self care but unable to carry out any work activities. Up and about more than 50% of waking hours)  3 - Symptomatic, >50% in bed, but not bedbound (Capable of only limited self-care, confined to bed or chair 50% or more of waking hours)  4 - Bedbound (Completely disabled. Cannot carry on any self-care. Totally confined to bed or chair)  5 - Death   Eustace Pen MM, Creech RH, Tormey DC, et al. [phone]). "Toxicity and response criteria of the [facility]". [place] Oncol. 5 (6): 649-55    LABORATORY DATA:  Lab Results  Component Value Date   WBC 11.9 (H) 05/03/2019   HGB 11.3 (L) 05/03/2019   HCT 34.3 (L) 05/03/2019   MCV 106.2 (H) 05/03/2019   PLT 327 05/03/2019   Lab Results  Component Value Date   NA 142 05/03/2019   K 3.9 05/03/2019   CL 101 05/03/2019   CO2 29 05/03/2019   Lab Results  Component Value Date   ALT 45 (H) 05/03/2019   AST 177 (HH) 05/03/2019   ALKPHOS 283 (H) 05/03/2019   BILITOT 1.2 05/03/2019      RADIOGRAPHY: DG Chest 2 View  Result Date: 04/23/2019 CLINICAL DATA:  64 year old female with history of severe shortness of breath. EXAM: CHEST - 2 VIEW COMPARISON:  Chest x-ray 05/13/2018. FINDINGS: Emphysematous changes in the lungs bilaterally. Irregular  opacities are noted in the area of the lower lobes  bilaterally, new compared to the prior chest x-ray, but corresponding to areas seen on prior chest CT 11/11/2018. No pleural effusions. No evidence of pulmonary edema. Heart size is normal. Upper mediastinal contours are within normal limits. Aortic atherosclerosis. IMPRESSION: 1. As seen on prior chest CT 11/11/2018, there are regular opacities in the medial aspects of the lower lobes of the lungs bilaterally, which are likely sequela of prior radiation therapy. No other acute findings are noted on today's examination. 2. Aortic atherosclerosis. Electronically Signed   By: Vinnie Langton M.D.   On: 04/23/2019 10:11   CT Chest Wo Contrast  Result Date: 04/30/2019 CLINICAL DATA:  Left breast cancer.  Restaging. EXAM: CT CHEST WITHOUT CONTRAST TECHNIQUE: Multidetector CT imaging of the chest was performed following the standard protocol without IV contrast. COMPARISON:  CT chest 11/11/2018 FINDINGS: Cardiovascular: The heart size appears within normal limits. No pericardial effusion. Aortic atherosclerosis. Mediastinum/Nodes: No enlarged mediastinal or axillary lymph nodes. Thyroid gland, trachea, and esophagus demonstrate no significant findings. Lungs/Pleura: No pleural effusion. Advanced changes of paraseptal and centrilobular emphysema. Bilateral lower lobe paravertebral fibrosis and masslike architectural distortion noted compatible with progressive changes of external beam radiation. Small nonspecific nodule within the posterior left lower lobe is new from previous exam measuring 4 mm, image 97/5. Upper Abdomen: Interval development of multifocal areas of low attenuation involving both lobes of liver concerning for metastatic disease. Index lesion within right hepatic lobe measures 2.5 cm, image 117/2. Index lesion within segment 5 measures 3.9 cm, image 151/2. Index lesion in lateral segment of left lobe of liver measures 3.6 cm, image 128/2.  Musculoskeletal: Multifocal sclerotic metastases identified within the bony thorax. This is similar to previous exam. IMPRESSION: 1. Interval development of multifocal low-attenuation lesions within the liver worrisome for metastatic disease. More definitive characterization with contrast enhanced liver protocol MRI or CT advised. 2. Progressive changes of external beam radiation within the paravertebral lower lobes. 3. New small nonspecific lung nodule within the left lower lobe measures 4 mm. Attention on follow-up imaging. 4. Similar appearance of multifocal sclerotic bone metastases. Aortic Atherosclerosis (ICD10-I70.0) and Emphysema (ICD10-J43.9). Electronically Signed   By: Kerby Moors M.D.   On: 04/30/2019 09:38   NM Bone Scan Whole Body  Result Date: 04/30/2019 CLINICAL DATA:  Metastatic breast cancer. EXAM: NUCLEAR MEDICINE WHOLE BODY BONE SCAN TECHNIQUE: Whole body anterior and posterior images were obtained approximately 3 hours after intravenous injection of radiopharmaceutical. RADIOPHARMACEUTICALS:  22.0 mCi Technetium-47m MDP IV COMPARISON:  November 11, 2018. FINDINGS: There has been interval increased number of bilateral rib abnormally increased foci of radiotracer uptake as well as new areas of radiotracer uptake in the thoracolumbar and sacral spine and ischia, and probably in the bilateral skull. IMPRESSION: Interval progression of osteoblastic metastatic disease in the axial skeleton and ribcage, and probably the skull. MR brain without and with intravenous contrast recommended. Electronically Signed   By: Revonda Humphrey   On: 04/30/2019 21:07   NM Pulmonary Perf and Vent  Result Date: 04/23/2019 CLINICAL DATA:  Short of breath.  Asthma, COPD. EXAM: NUCLEAR MEDICINE VENTILATION - PERFUSION LUNG SCAN TECHNIQUE: Ventilation images were obtained in multiple projections using inhaled aerosol Tc-25m DTPA. Perfusion images were obtained in multiple projections after intravenous injection of  Tc-65m MAA. RADIOPHARMACEUTICALS:  1.4 mCi of Tc-38m DTPA aerosol inhalation and 1.4 mCi Tc71m MAA IV COMPARISON:  CT 11/16/2018, chest radiograph 04/23/2019 FINDINGS: Ventilation: Decreased ventilation in the lateral RIGHT upper lobe. Decreased ventilation the medial aspect of the  LEFT lower lobe on posterior projection. Perfusion: Perfusion defect in the lateral aspect of the RIGHT upper lobe matches the ventilation defect. Additionally there is a focal emphysema in the lateral aspect the RIGHT upper lobe corresponding to this perfusion defect. Decreased perfusion to the medial LEFT lower lobe also matches the ventilation defect and there is emphysema / bronchiectasis on comparison CT. IMPRESSION: 1. Matched perfusion ventilation defects correspond to focal emphysema on comparison CT. 2. No evidence acute pulmonary embolism. Electronically Signed   By: Suzy Bouchard M.D.   On: 04/23/2019 15:43   MR TOTAL SPINE METS SCREENING  Result Date: 05/12/2019 CLINICAL DATA:  Widely metastatic breast cancer. Burning and numbness in the legs. Evaluate for cord compression. EXAM: MRI TOTAL SPINE WITHOUT AND WITH CONTRAST TECHNIQUE: Multisequence MR imaging of the spine from the cervical spine to the sacrum was performed prior to and following IV contrast administration for evaluation of spinal metastatic disease. CONTRAST:  66mL GADAVIST GADOBUTROL 1 MMOL/ML IV SOLN COMPARISON:  Nuclear medicine bone scan 04/30/2019 FINDINGS: MRI CERVICAL SPINE FINDINGS Alignment: Normal Vertebrae: Osseous metastatic disease throughout the cervical region. Fusion at the C5-6 level, probably congenital. No evidence of lytic destructive bone lesion or extraosseous extension tumor. Cord: No evidence of cord metastasis. Posterior Fossa, vertebral arteries, paraspinal tissues: No significant regional soft tissue lesion seen. Disc levels: Mild non-compressive degenerative changes at C1-2, C2-3 and C3-4. At C4-5, there is degenerative  spondylosis with endplate osteophytes and bulging of the disc. AP diameter of the canal is only 6.5 mm. The subarachnoid space is effaced in the cord is indented slightly. There is bilateral foraminal narrowing. No stenosis at the C5-6 fusion level. Mild non-compressive spondylosis at C6-7 and C7-T1. MRI THORACIC SPINE FINDINGS Alignment:  Normal Vertebrae: Widespread osseous metastatic disease. Previous radiation change of the T6, T7, T8 and T9 vertebrae with fatty change. Small metastases are present within the fatty areas. The other non radiated thoracic vertebral bodies show more extensive and confluent intra osseous tumor. There is no evidence of pathologic fracture. There is no visible extraosseous tumor extension. Cord: No evidence of primary cord or leptomeningeal metastatic disease. Paraspinal and other soft tissues: No significant finding. Disc levels: No significant thoracic disc disease. Mild non-compressive disc bulge at T11-12. MRI LUMBAR SPINE FINDINGS Segmentation:  5 lumbar type vertebral bodies. Alignment:  3 mm degenerative retrolisthesis L1-2. Vertebrae: Widespread osseous metastatic disease throughout the lumbar spine and the sacrum. Disease is more pronounced in the sacrum, particularly on the right. In the lumbar region, there is no fracture or extraosseous tumor extension. In the sacrum, there is probably some early tumor extension in the right S1 foramen that could affect that nerve. Conus medullaris: Extends to the L1 level and appears normal. Paraspinal and other soft tissues: Negative.  Not studied in detail. Disc levels: No significant disc pathology. No compressive stenosis of the canal. Foramina sufficiently patent. IMPRESSION: Widespread osseous metastatic disease throughout the spine. Fatty change of the marrow from T6 through T9 consistent with previous radiation. No evidence of extraosseous tumor extension, pathologic fracture or primary neural metastasis in the cervical, thoracic  or lumbar region. More extensive metastatic disease within the sacrum, particularly on the right. I think there is early tumor extension into the foramen on the right at the S1 foramen, which could explain right S1 symptomatology. Electronically Signed   By: Nelson Chimes M.D.   On: 05/12/2019 15:35       IMPRESSION/PLAN: 1. Recurrent metastatic ER/PR positive carcinoma of  the breast with painful bone metastases. Dr. Lisbeth Renshaw reviews her course since her last visit as well as the most recent MRI of her spine revealing lumbosacral disease that is new and likely the source of her pain. He offers a 10 fraction course of palliative radiotherapy to this site. We discussed the risks, benefits, short, and long term effects of radiotherapy, and the patient is interested in proceeding. Dr. Lisbeth Renshaw discusses the delivery and logistics of radiotherapy. Written consent is obtained and placed in the chart, a copy was provided to the patient. She will simulate following this appointment today, and we anticipate starting treatment early next week.  2. Painful bone metastases. We discussed her current regimen of long acting MS Contin, and short acting Morphine in liquid suspension 10 mg/5 mL. After discussing how she is taking her medication at home, it appears she does not have the correct syringe to draw up her short acting morphine. We will provide a new syringe for her to use, since she's likely only getting 1/3 the dose of her prescription. She is encourage to contact us if she has had persistent difficulties next week so we can make adjustments as needed.   In a visit lasting 45 minutes, greater than 50% of the time was spent face to face discussing the patient's condition, in preparation for the discussion, and coordinating the patient's care.  The above documentation reflects my direct findings during this shared patient visit. Please see the separate note by Dr. Lisbeth Renshaw on this date for the remainder of the patient's  plan of care.    Carola Rhine, PAC

## 2019-05-16 ENCOUNTER — Other Ambulatory Visit: Payer: Self-pay | Admitting: Radiology

## 2019-05-17 ENCOUNTER — Telehealth: Payer: Self-pay | Admitting: Radiation Oncology

## 2019-05-17 MED FILL — CAPECITABINE 500 MG TABLET: 500 | 21 days supply | Qty: 84 | Fill #0

## 2019-05-17 NOTE — Telephone Encounter (Addendum)
I called the patient to see how her pain was since we had clarified her short acting morphine. She was able to start using 5 mL and found it gave her a light improvement in her pain. She is counseled that she could go up to 7.5 mL, or even up to 10 mL if her symptoms are persistent. She will have her liver biopsy tomorrow and start XRT Wednesday. She is in agreement with this plan and to call if she has any questions or concerns with treatment or her pain regimen.

## 2019-05-18 ENCOUNTER — Ambulatory Visit: Payer: BC Managed Care – PPO | Admitting: Radiation Oncology

## 2019-05-18 ENCOUNTER — Other Ambulatory Visit: Payer: Self-pay

## 2019-05-18 ENCOUNTER — Ambulatory Visit (HOSPITAL_COMMUNITY)
Admission: RE | Admit: 2019-05-18 | Discharge: 2019-05-18 | Disposition: A | Discharge status: Home / Self Care | Payer: BC Managed Care – PPO | Source: Ambulatory Visit | Attending: Oncology | Admitting: Oncology

## 2019-05-18 ENCOUNTER — Encounter (HOSPITAL_COMMUNITY): Payer: Self-pay

## 2019-05-18 DIAGNOSIS — Z853 Personal history of malignant neoplasm of breast: Secondary | ICD-10-CM | POA: Diagnosis not present

## 2019-05-18 DIAGNOSIS — C787 Secondary malignant neoplasm of liver and intrahepatic bile duct: Secondary | ICD-10-CM | POA: Insufficient documentation

## 2019-05-18 DIAGNOSIS — J45909 Unspecified asthma, uncomplicated: Secondary | ICD-10-CM | POA: Diagnosis not present

## 2019-05-18 DIAGNOSIS — Z79899 Other long term (current) drug therapy: Secondary | ICD-10-CM | POA: Insufficient documentation

## 2019-05-18 DIAGNOSIS — I10 Essential (primary) hypertension: Secondary | ICD-10-CM | POA: Diagnosis not present

## 2019-05-18 DIAGNOSIS — Z86010 Personal history of colonic polyps: Secondary | ICD-10-CM | POA: Diagnosis not present

## 2019-05-18 DIAGNOSIS — Z170 Estrogen receptor positive status [ER+]: Secondary | ICD-10-CM

## 2019-05-18 DIAGNOSIS — E785 Hyperlipidemia, unspecified: Secondary | ICD-10-CM | POA: Insufficient documentation

## 2019-05-18 DIAGNOSIS — Z801 Family history of malignant neoplasm of trachea, bronchus and lung: Secondary | ICD-10-CM | POA: Insufficient documentation

## 2019-05-18 DIAGNOSIS — Z885 Allergy status to narcotic agent status: Secondary | ICD-10-CM | POA: Diagnosis not present

## 2019-05-18 DIAGNOSIS — K219 Gastro-esophageal reflux disease without esophagitis: Secondary | ICD-10-CM | POA: Insufficient documentation

## 2019-05-18 DIAGNOSIS — Z86718 Personal history of other venous thrombosis and embolism: Secondary | ICD-10-CM | POA: Diagnosis not present

## 2019-05-18 DIAGNOSIS — Z510 Encounter for antineoplastic radiation therapy: Secondary | ICD-10-CM | POA: Diagnosis not present

## 2019-05-18 DIAGNOSIS — Z7982 Long term (current) use of aspirin: Secondary | ICD-10-CM | POA: Insufficient documentation

## 2019-05-18 DIAGNOSIS — Z7952 Long term (current) use of systemic steroids: Secondary | ICD-10-CM | POA: Insufficient documentation

## 2019-05-18 DIAGNOSIS — Z87891 Personal history of nicotine dependence: Secondary | ICD-10-CM | POA: Insufficient documentation

## 2019-05-18 DIAGNOSIS — C50512 Malignant neoplasm of lower-outer quadrant of left female breast: Secondary | ICD-10-CM

## 2019-05-18 DIAGNOSIS — H9193 Unspecified hearing loss, bilateral: Secondary | ICD-10-CM | POA: Insufficient documentation

## 2019-05-18 DIAGNOSIS — K769 Liver disease, unspecified: Secondary | ICD-10-CM | POA: Diagnosis present

## 2019-05-18 DIAGNOSIS — Z923 Personal history of irradiation: Secondary | ICD-10-CM | POA: Insufficient documentation

## 2019-05-18 LAB — CBC WITH DIFFERENTIAL/PLATELET
Abs Immature Granulocytes: 0.36 10*3/uL — ABNORMAL HIGH (ref 0.00–0.07)
Basophils Absolute: 0.1 10*3/uL (ref 0.0–0.1)
Basophils Relative: 0 %
Eosinophils Absolute: 0 10*3/uL (ref 0.0–0.5)
Eosinophils Relative: 0 %
HCT: 41 % (ref 36.0–46.0)
Hemoglobin: 12.8 g/dL (ref 12.0–15.0)
Immature Granulocytes: 2 %
Lymphocytes Relative: 6 %
Lymphs Abs: 1 10*3/uL (ref 0.7–4.0)
MCH: 33 pg (ref 26.0–34.0)
MCHC: 31.2 g/dL (ref 30.0–36.0)
MCV: 105.7 fL — ABNORMAL HIGH (ref 80.0–100.0)
Monocytes Absolute: 1.5 10*3/uL — ABNORMAL HIGH (ref 0.1–1.0)
Monocytes Relative: 9 %
Neutro Abs: 13 10*3/uL — ABNORMAL HIGH (ref 1.7–7.7)
Neutrophils Relative %: 83 %
Platelets: 195 10*3/uL (ref 150–400)
RBC: 3.88 MIL/uL (ref 3.87–5.11)
RDW: 14.9 % (ref 11.5–15.5)
WBC: 15.9 10*3/uL — ABNORMAL HIGH (ref 4.0–10.5)
nRBC: 0.3 % — ABNORMAL HIGH (ref 0.0–0.2)

## 2019-05-18 LAB — PROTIME-INR
INR: 1.3 — ABNORMAL HIGH (ref 0.8–1.2)
Prothrombin Time: 16 seconds — ABNORMAL HIGH (ref 11.4–15.2)

## 2019-05-18 LAB — COMPREHENSIVE METABOLIC PANEL
ALT: 50 U/L — ABNORMAL HIGH (ref 0–44)
AST: 212 U/L — ABNORMAL HIGH (ref 15–41)
Albumin: 2.4 g/dL — ABNORMAL LOW (ref 3.5–5.0)
Alkaline Phosphatase: 234 U/L — ABNORMAL HIGH (ref 38–126)
Anion gap: 17 — ABNORMAL HIGH (ref 5–15)
BUN: 26 mg/dL — ABNORMAL HIGH (ref 8–23)
CO2: 23 mmol/L (ref 22–32)
Calcium: 9.3 mg/dL (ref 8.9–10.3)
Chloride: 98 mmol/L (ref 98–111)
Creatinine, Ser: 1.42 mg/dL — ABNORMAL HIGH (ref 0.44–1.00)
GFR calc Af Amer: 46 mL/min — ABNORMAL LOW (ref >60)
GFR calc non Af Amer: 39 mL/min — ABNORMAL LOW (ref >60)
Glucose, Bld: 131 mg/dL — ABNORMAL HIGH (ref 70–99)
Potassium: 4.4 mmol/L (ref 3.5–5.1)
Sodium: 138 mmol/L (ref 135–145)
Total Bilirubin: 2.3 mg/dL — ABNORMAL HIGH (ref 0.3–1.2)
Total Protein: 6.8 g/dL (ref 6.5–8.1)

## 2019-05-18 MED ORDER — MIDAZOLAM HCL 2 MG/2ML IJ SOLN
INTRAMUSCULAR | Status: AC | PRN
  Administered 2019-05-18: 1 mg via INTRAVENOUS

## 2019-05-18 MED ORDER — LIDOCAINE HCL (PF) 1 % IJ SOLN
INTRAMUSCULAR | Status: AC | PRN
  Administered 2019-05-18: 10 mL via INTRADERMAL

## 2019-05-18 MED ORDER — FENTANYL CITRATE (PF) 100 MCG/2ML IJ SOLN
INTRAMUSCULAR | Status: AC
  Filled 2019-05-18: qty 2

## 2019-05-18 MED ORDER — MIDAZOLAM HCL 2 MG/2ML IJ SOLN
INTRAMUSCULAR | Status: AC
  Filled 2019-05-18: qty 4

## 2019-05-18 MED ORDER — GELATIN ABSORBABLE 12-7 MM EX MISC
CUTANEOUS | Status: AC
  Filled 2019-05-18: qty 1

## 2019-05-18 MED ORDER — SODIUM CHLORIDE 0.9 % IV SOLN: INTRAVENOUS | Status: DC

## 2019-05-18 MED ORDER — FENTANYL CITRATE (PF) 100 MCG/2ML IJ SOLN
INTRAMUSCULAR | Status: AC | PRN
  Administered 2019-05-18 (×2): 50 ug via INTRAVENOUS

## 2019-05-18 MED ORDER — LIDOCAINE HCL 1 % IJ SOLN
INTRAMUSCULAR | Status: AC
  Filled 2019-05-18: qty 20

## 2019-05-18 NOTE — Consult Note (Signed)
Chief Complaint: Dr. Loretta Plume  Referring Physician(s): [place] Physician: Jacqulynn Cadet  Patient Status: [facility] - Out-pt  History of Present Illness: Caitlin Wall is a 64 y.o. female History of breast cancer found to have lever lesion while being worked up for hip and back pain . Team is requesting liver biopsy for further determination of possible metastases.   Past Medical History:  Diagnosis Date  . Acute asthma 07/27/2012  . Breast cancer ([place]) 12/28/14   Left BresastDCIS  . COLONIC POLYPS, HX OF 11/04/2007  . DVT (deep venous thrombosis) ([place]) 2018   left leg  . GERD 11/04/2007  . GERD (gastroesophageal reflux disease)   . HAIR LOSS 11/04/2007  . Hearing loss    bilateral - wears hearing aids  . Hx of cardiac cath 11/15/2016   a. LHC 11/15/16 showed normal coronaries and EF 50-55% with normal EDP. (done for false positive NST)  . Hx of colonic polyps 1995   adenomatous  . Hyperlipidemia   . HYPERLIPIDEMIA 11/04/2007  . Hypertension   . HYPERTENSION 06/07/2009  . LBBB (left bundle branch block) 07/26/2011   no current problems  . Personal history of radiation therapy   . S/P radiation therapy 02/02/15-03/24/15   left breast 60.4GY    Past Surgical History:  Procedure Laterality Date  . ABDOMINAL HYSTERECTOMY     partial  . ANTERIOR FUSION CERVICAL SPINE    . bilat ear surgury/hearing loss     wears hearing aids  . BREAST LUMPECTOMY Left 2016  . BREAST LUMPECTOMY WITH RADIOACTIVE SEED LOCALIZATION Left 12/28/2014   Procedure: BREAST LUMPECTOMY WITH RADIOACTIVE SEED LOCALIZATION;  Surgeon: Autumn Messing III, MD;  Location: [facility];  Service: General;  Laterality: Left;  . COLONOSCOPY    . LEFT HEART CATH AND CORONARY ANGIOGRAPHY N/A 11/15/2016   Procedure: LEFT HEART CATH AND CORONARY ANGIOGRAPHY;  Surgeon: Belva Crome, MD;  Location: [facility];  Service: Cardiovascular;  Laterality: N/A;  . POLYPECTOMY    . UPPER  GASTROINTESTINAL ENDOSCOPY    . VULVECTOMY PARTIAL N/A 02/02/2019   Procedure: VULVECTOMY PARTIAL;  Surgeon: Newton Pigg, MD;  Location: [facility]  Service: Gynecology;  Laterality: N/A;  . WISDOM TOOTH EXTRACTION      Allergies: Codeine and Tramadol hcl  Medications: Prior to Admission medications   Medication Sig Start Date End Date Taking? Authorizing Provider  aspirin EC 81 MG tablet Take 81 mg by mouth daily.    [provider]  budesonide-formoterol (SYMBICORT) 160-4.5 MCG/ACT inhaler Inhale 2 puffs into the lungs 2 (two) times daily. 12/01/18   Biagio Borg, MD  capecitabine (XELODA) 500 MG tablet Take 3 tablets (1,500 mg total) by mouth 2 (two) times daily after a meal. Take for 14 days, then do not take for 7 days, then repeat. Patient not taking: Reported on 05/13/2019 05/03/19   Magrinat, Virgie Dad, MD  cholecalciferol (VITAMIN D3) 25 MCG (1000 UT) tablet Take 1 tablet (1,000 Units total) by mouth daily. 04/28/18   Magrinat, Virgie Dad, MD  dexamethasone (DECADRON) 2 MG tablet Take 2 tablets (4 mg total) by mouth in the morning. 05/12/19   Gardenia Phlegm, NP  DULoxetine (CYMBALTA) 30 MG capsule Take 1 capsule (30 mg total) by mouth daily. Patient not taking: Reported on 05/13/2019 05/12/19   Gardenia Phlegm, NP  Ipratropium-Albuterol (COMBIVENT) 20-100 MCG/ACT AERS respimat Inhale 1 puff into the lungs every 6 (six) hours. 04/12/19  Magrinat, Virgie Dad, MD  LORazepam (ATIVAN) 1 MG tablet Take 1 tab ASAP, repeat 1 hour prior to scan and if needed repeat for known claustrophobia for MRI today Patient not taking: Reported on 05/13/2019 05/12/19   Magrinat, Virgie Dad, MD  magnesium citrate SOLN Take 1 Bottle by mouth once. Take once today    [provider]  morphine (MS CONTIN) 15 MG 12 hr tablet Take 1 tablet (15 mg total) by mouth every 8 (eight) hours. 05/12/19   Gardenia Phlegm, NP  morphine 10 MG/5ML solution Take 1.3-2.5 mLs  (2.6-5 mg total) by mouth every 6 (six) hours as needed for severe pain. 03/02/19   Magrinat, Virgie Dad, MD  morphine 10 MG/5ML solution Take 2.5-5 mLs (5-10 mg total) by mouth every 4 (four) hours as needed for severe pain. 05/14/19   Magrinat, Virgie Dad, MD  ondansetron (ZOFRAN) 4 MG tablet Take 1 tablet (4 mg total) by mouth 3 (three) times daily before meals. 05/03/19   Magrinat, Virgie Dad, MD  pantoprazole (PROTONIX) 40 MG tablet Take 1 tablet (40 mg total) by mouth daily. 03/09/19   Magrinat, Virgie Dad, MD  polyethylene glycol (MIRALAX / GLYCOLAX) 17 g packet Take 17 g by mouth daily. To prevent constipation from pain medication    [provider]  senna-docusate (SENOKOT-S) 8.6-50 MG tablet Take 2 tablets by mouth 2 (two) times daily. To prevent constipation from pain medication    [provider]     Family History  Problem Relation Age of Onset  . Breast cancer Other   . Cancer Paternal Grandmother        breast  . Lung cancer Father   . Breast cancer Maternal Grandmother   . Healthy Mother   . Coronary artery disease Neg Hx   . Colon cancer Neg Hx   . Pancreatic cancer Neg Hx   . Rectal cancer Neg Hx   . Stomach cancer Neg Hx   . Esophageal cancer Neg Hx     Social History   Socioeconomic History  . Marital status: Married    Spouse name: Not on file  . Number of children: 3  . Years of education: Not on file  . Highest education level: Not on file  Occupational History  . Occupation: accounts Forensic scientist: [employer]  Tobacco Use  . Smoking status: Former Smoker    Packs/day: 1.00    Years: 20.00    Pack years: 20.00    Types: Cigarettes    Quit date: 08/19/1989    Years since quitting: 29.7  . Smokeless tobacco: Never Used  Substance and Sexual Activity  . Alcohol use: Yes    Comment: rare  . Drug use: No  . Sexual activity: Not on file  Other Topics Concern  . Not on file  Social History Narrative  . Not on file   Social  Determinants of Health   Financial Resource Strain:   . Difficulty of Paying Living Expenses:   Food Insecurity:   . Worried About Charity fundraiser in the Last Year:   . Arboriculturist in the Last Year:   Transportation Needs:   . Film/video editor (Medical):   Marland Kitchen Lack of Transportation (Non-Medical):   Physical Activity:   . Days of Exercise per Week:   . Minutes of Exercise per Session:   Stress:   . Feeling of Stress :   Social Connections:   . Frequency  of Communication with Friends and Family:   . Frequency of Social Gatherings with Friends and Family:   . Attends Religious Services:   . Active Member of Clubs or Organizations:   . Attends Archivist Meetings:   Marland Kitchen Marital Status:      Review of Systems: A 12 point ROS discussed and pertinent positives are indicated in the HPI above.  All other systems are negative.  Review of Systems  Constitutional: Negative for fatigue and fever.  HENT: Negative for congestion.   Respiratory: Positive for shortness of breath ( baseline). Negative for cough.   Gastrointestinal: Negative for abdominal pain, diarrhea, nausea and vomiting.  Musculoskeletal: Positive for back pain and myalgias ( hip pain).    Vital Signs: BP (!) 100/54   Pulse 100   Temp 97.6 F (36.4 C) (Oral)   Resp (!) 24   LMP  (LMP Unknown)   SpO2 98%   Physical Exam Vitals and nursing note reviewed.  Constitutional:      Appearance: She is well-developed.  HENT:     Head: Normocephalic and atraumatic.  Eyes:     Conjunctiva/sclera: Conjunctivae normal.  Cardiovascular:     Rate and Rhythm: Normal rate.     Heart sounds: Normal heart sounds.  Pulmonary:     Effort: Pulmonary effort is normal.     Breath sounds: Normal breath sounds.  Musculoskeletal:     Cervical back: Normal range of motion.  Neurological:     Mental Status: She is alert and oriented to person, place, and time.  Psychiatric:        Mood and Affect: Mood normal.         Behavior: Behavior normal.        Thought Content: Thought content normal.     Imaging: DG Chest 2 View  Result Date: 04/23/2019 CLINICAL DATA:  64 year old female with history of severe shortness of breath. EXAM: CHEST - 2 VIEW COMPARISON:  Chest x-ray 05/13/2018. FINDINGS: Emphysematous changes in the lungs bilaterally. Irregular opacities are noted in the area of the lower lobes bilaterally, new compared to the prior chest x-ray, but corresponding to areas seen on prior chest CT 11/11/2018. No pleural effusions. No evidence of pulmonary edema. Heart size is normal. Upper mediastinal contours are within normal limits. Aortic atherosclerosis. IMPRESSION: 1. As seen on prior chest CT 11/11/2018, there are regular opacities in the medial aspects of the lower lobes of the lungs bilaterally, which are likely sequela of prior radiation therapy. No other acute findings are noted on today's examination. 2. Aortic atherosclerosis. Electronically Signed   By: Vinnie Langton M.D.   On: 04/23/2019 10:11   CT Chest Wo Contrast  Result Date: 04/30/2019 CLINICAL DATA:  Left breast cancer.  Restaging. EXAM: CT CHEST WITHOUT CONTRAST TECHNIQUE: Multidetector CT imaging of the chest was performed following the standard protocol without IV contrast. COMPARISON:  CT chest 11/11/2018 FINDINGS: Cardiovascular: The heart size appears within normal limits. No pericardial effusion. Aortic atherosclerosis. Mediastinum/Nodes: No enlarged mediastinal or axillary lymph nodes. Thyroid gland, trachea, and esophagus demonstrate no significant findings. Lungs/Pleura: No pleural effusion. Advanced changes of paraseptal and centrilobular emphysema. Bilateral lower lobe paravertebral fibrosis and masslike architectural distortion noted compatible with progressive changes of external beam radiation. Small nonspecific nodule within the posterior left lower lobe is new from previous exam measuring 4 mm, image 97/5. Upper Abdomen:  Interval development of multifocal areas of low attenuation involving both lobes of liver concerning for metastatic disease. Index  lesion within right hepatic lobe measures 2.5 cm, image 117/2. Index lesion within segment 5 measures 3.9 cm, image 151/2. Index lesion in lateral segment of left lobe of liver measures 3.6 cm, image 128/2. Musculoskeletal: Multifocal sclerotic metastases identified within the bony thorax. This is similar to previous exam. IMPRESSION: 1. Interval development of multifocal low-attenuation lesions within the liver worrisome for metastatic disease. More definitive characterization with contrast enhanced liver protocol MRI or CT advised. 2. Progressive changes of external beam radiation within the paravertebral lower lobes. 3. New small nonspecific lung nodule within the left lower lobe measures 4 mm. Attention on follow-up imaging. 4. Similar appearance of multifocal sclerotic bone metastases. Aortic Atherosclerosis (ICD10-I70.0) and Emphysema (ICD10-J43.9). Electronically Signed   By: Kerby Moors M.D.   On: 04/30/2019 09:38   NM Bone Scan Whole Body  Result Date: 04/30/2019 CLINICAL DATA:  Metastatic breast cancer. EXAM: NUCLEAR MEDICINE WHOLE BODY BONE SCAN TECHNIQUE: Whole body anterior and posterior images were obtained approximately 3 hours after intravenous injection of radiopharmaceutical. RADIOPHARMACEUTICALS:  22.0 mCi Technetium-11m MDP IV COMPARISON:  November 11, 2018. FINDINGS: There has been interval increased number of bilateral rib abnormally increased foci of radiotracer uptake as well as new areas of radiotracer uptake in the thoracolumbar and sacral spine and ischia, and probably in the bilateral skull. IMPRESSION: Interval progression of osteoblastic metastatic disease in the axial skeleton and ribcage, and probably the skull. MR brain without and with intravenous contrast recommended. Electronically Signed   By: Revonda Humphrey   On: 04/30/2019 21:07   NM  Pulmonary Perf and Vent  Result Date: 04/23/2019 CLINICAL DATA:  Short of breath.  Asthma, COPD. EXAM: NUCLEAR MEDICINE VENTILATION - PERFUSION LUNG SCAN TECHNIQUE: Ventilation images were obtained in multiple projections using inhaled aerosol Tc-81m DTPA. Perfusion images were obtained in multiple projections after intravenous injection of Tc-63m MAA. RADIOPHARMACEUTICALS:  1.4 mCi of Tc-24m DTPA aerosol inhalation and 1.4 mCi Tc10m MAA IV COMPARISON:  CT 11/16/2018, chest radiograph 04/23/2019 FINDINGS: Ventilation: Decreased ventilation in the lateral RIGHT upper lobe. Decreased ventilation the medial aspect of the LEFT lower lobe on posterior projection. Perfusion: Perfusion defect in the lateral aspect of the RIGHT upper lobe matches the ventilation defect. Additionally there is a focal emphysema in the lateral aspect the RIGHT upper lobe corresponding to this perfusion defect. Decreased perfusion to the medial LEFT lower lobe also matches the ventilation defect and there is emphysema / bronchiectasis on comparison CT. IMPRESSION: 1. Matched perfusion ventilation defects correspond to focal emphysema on comparison CT. 2. No evidence acute pulmonary embolism. Electronically Signed   By: Suzy Bouchard M.D.   On: 04/23/2019 15:43   MR TOTAL SPINE METS SCREENING  Result Date: 05/12/2019 CLINICAL DATA:  Widely metastatic breast cancer. Burning and numbness in the legs. Evaluate for cord compression. EXAM: MRI TOTAL SPINE WITHOUT AND WITH CONTRAST TECHNIQUE: Multisequence MR imaging of the spine from the cervical spine to the sacrum was performed prior to and following IV contrast administration for evaluation of spinal metastatic disease. CONTRAST:  34mL GADAVIST GADOBUTROL 1 MMOL/ML IV SOLN COMPARISON:  Nuclear medicine bone scan 04/30/2019 FINDINGS: MRI CERVICAL SPINE FINDINGS Alignment: Normal Vertebrae: Osseous metastatic disease throughout the cervical region. Fusion at the C5-6 level, probably  congenital. No evidence of lytic destructive bone lesion or extraosseous extension tumor. Cord: No evidence of cord metastasis. Posterior Fossa, vertebral arteries, paraspinal tissues: No significant regional soft tissue lesion seen. Disc levels: Mild non-compressive degenerative changes at C1-2, C2-3 and C3-4. At  C4-5, there is degenerative spondylosis with endplate osteophytes and bulging of the disc. AP diameter of the canal is only 6.5 mm. The subarachnoid space is effaced in the cord is indented slightly. There is bilateral foraminal narrowing. No stenosis at the C5-6 fusion level. Mild non-compressive spondylosis at C6-7 and C7-T1. MRI THORACIC SPINE FINDINGS Alignment:  Normal Vertebrae: Widespread osseous metastatic disease. Previous radiation change of the T6, T7, T8 and T9 vertebrae with fatty change. Small metastases are present within the fatty areas. The other non radiated thoracic vertebral bodies show more extensive and confluent intra osseous tumor. There is no evidence of pathologic fracture. There is no visible extraosseous tumor extension. Cord: No evidence of primary cord or leptomeningeal metastatic disease. Paraspinal and other soft tissues: No significant finding. Disc levels: No significant thoracic disc disease. Mild non-compressive disc bulge at T11-12. MRI LUMBAR SPINE FINDINGS Segmentation:  5 lumbar type vertebral bodies. Alignment:  3 mm degenerative retrolisthesis L1-2. Vertebrae: Widespread osseous metastatic disease throughout the lumbar spine and the sacrum. Disease is more pronounced in the sacrum, particularly on the right. In the lumbar region, there is no fracture or extraosseous tumor extension. In the sacrum, there is probably some early tumor extension in the right S1 foramen that could affect that nerve. Conus medullaris: Extends to the L1 level and appears normal. Paraspinal and other soft tissues: Negative.  Not studied in detail. Disc levels: No significant disc  pathology. No compressive stenosis of the canal. Foramina sufficiently patent. IMPRESSION: Widespread osseous metastatic disease throughout the spine. Fatty change of the marrow from T6 through T9 consistent with previous radiation. No evidence of extraosseous tumor extension, pathologic fracture or primary neural metastasis in the cervical, thoracic or lumbar region. More extensive metastatic disease within the sacrum, particularly on the right. I think there is early tumor extension into the foramen on the right at the S1 foramen, which could explain right S1 symptomatology. Electronically Signed   By: Nelson Chimes M.D.   On: 05/12/2019 15:35    Labs:  CBC: Recent Labs    04/05/19 0854 04/23/19 0828 05/03/19 0959 05/18/19 1100  WBC 2.9* 12.3* 11.9* 15.9*  HGB 12.2 12.0 11.3* 12.8  HCT 35.3* 35.0* 34.3* 41.0  PLT 163 308 327 195    COAGS: Recent Labs    05/18/19 1100  INR 1.3*    BMP: Recent Labs    04/05/19 0854 04/23/19 0828 05/03/19 0959 05/18/19 1100  NA 145 141 142 138  K 4.2 3.6 3.9 4.4  CL 109 104 101 98  CO2 29 24 29 23   GLUCOSE 93 108* 118* 131*  BUN 18 32* 14 26*  CALCIUM 9.4 9.6 9.1 9.3  CREATININE 1.22* 1.83* 1.25* 1.42*  GFRNONAA 47* 29* 46* 39*  GFRAA 55* 34* 53* 46*    LIVER FUNCTION TESTS: Recent Labs    04/05/19 0854 04/23/19 0828 05/03/19 0959 05/18/19 1100  BILITOT 0.9 1.7* 1.2 2.3*  AST 74* 85* 177* 212*  ALT 31 37 45* 50*  ALKPHOS 135* 229* 283* 234*  PROT 6.8 7.4 7.0 6.8  ALBUMIN 3.6 2.8* 2.6* 2.4*    TUMOR MARKERS: No results for input(s): AFPTM, CEA, CA199, CHROMGRNA in the last 8760 hours.  Assessment and Plan:  64 y.o, female outpatient. History of breast cancer found to have lever lesion while being worked up for hip and back pain . Team is requesting liver biopsy for further determination of possible metastases.  Pertinent Imaging 3.5.21 - Ct chest reads Interval development  of multifocal low-attenuation lesions  within the liver worrisome for metastatic disease  Pertinent IR History none  Pertinent Allergies Codeine Tramadol  WBC is 15.9 All labs and medications are within acceptable parameters.  Patient is afebrile.  Risks and benefits of liver biopsy was discussed with the patient and/or patient's family including, but not limited to bleeding, infection, damage to adjacent structures or low yield requiring additional tests.  All of the questions were answered and there is agreement to proceed.  Consent signed and in chart.    Thank you for this interesting consult.  I greatly enjoyed meeting Caitlin Wall and look forward to participating in their care.  A copy of this report was sent to the requesting provider on this date.  Electronically Signed: Avel Peace, NP 05/18/2019, 1:09 PM   I spent a total of  40 Minutes   in face to face in clinical consultation, greater than 50% of which was counseling/coordinating care for liver biopsy

## 2019-05-18 NOTE — Discharge Instructions (Signed)
For any questions or concerns call [phone]; for after hours call [phone] and ask for on call MD    Moderate Conscious Sedation, Adult, Care After These instructions provide you with information about caring for yourself after your procedure. Your health care provider may also give you more specific instructions. Your treatment has been planned according to current medical practices, but problems sometimes occur. Call your health care provider if you have any problems or questions after your procedure. What can I expect after the procedure? After your procedure, it is common:  To feel sleepy for several hours.  To feel clumsy and have poor balance for several hours.  To have poor judgment for several hours.  To vomit if you eat too soon. Follow these instructions at home: For at least 24 hours after the procedure:   Do not: ? Participate in activities where you could fall or become injured. ? Drive. ? Use heavy machinery. ? Drink alcohol. ? Take sleeping pills or medicines that cause drowsiness. ? Make important decisions or sign legal documents. ? Take care of children on your own.  Rest. Eating and drinking  Follow the diet recommended by your health care provider.  If you vomit: ? Drink water, juice, or soup when you can drink without vomiting. ? Make sure you have little or no nausea before eating solid foods. General instructions  Have a responsible adult stay with you until you are awake and alert.  Take over-the-counter and prescription medicines only as told by your health care provider.  If you smoke, do not smoke without supervision.  Keep all follow-up visits as told by your health care provider. This is important. Contact a health care provider if:  You keep feeling nauseous or you keep vomiting.  You feel light-headed.  You develop a rash.  You have a fever. Get help right away if:  You have trouble breathing. This information is not  intended to replace advice given to you by your health care provider. Make sure you discuss any questions you have with your health care provider. Document Revised: 01/24/2017 Document Reviewed: 06/03/2015 Elsevier Patient Education  [place].   Liver Biopsy, Care After These instructions give you information on caring for yourself after your procedure. Your doctor may also give you more specific instructions. Call your doctor if you have any problems or questions after your procedure. What can I expect after the procedure? After the procedure, it is common to have:  Pain and soreness where the biopsy was done.  Bruising around the area where the biopsy was done.  Sleepiness and be tired for a few days. Follow these instructions at home: Medicines  Take over-the-counter and prescription medicines only as told by your doctor.  If you were prescribed an antibiotic medicine, take it as told by your doctor. Do not stop taking the antibiotic even if you start to feel better.  Do not take medicines such as aspirin and ibuprofen. These medicines can thin your blood. Do not take these medicines unless your doctor tells you to take them.  If you are taking prescription pain medicine, take actions to prevent or treat constipation. Your doctor may recommend that you: ? Drink enough fluid to keep your pee (urine) clear or pale yellow. ? Take over-the-counter or prescription medicines. ? Eat foods that are high in fiber, such as fresh fruits and vegetables, whole grains, and beans. ? Limit foods that are high in fat and processed sugars, such as fried  and sweet foods. Caring for your cut  Follow instructions from your doctor about how to take care of your cuts from surgery (incisions). Make sure you: ? Wash your hands with soap and water before you change your bandage (dressing). If you cannot use soap and water, use hand sanitizer. ? Change your bandage as told by your doctor. ? Leave  stitches (sutures), skin glue, or skin tape (adhesive) strips in place. They may need to stay in place for 2 weeks or longer. If tape strips get loose and curl up, you may trim the loose edges. Do not remove tape strips completely unless your doctor says it is okay.  Check your cuts every day for signs of infection. Check for: ? Redness, swelling, or more pain. ? Fluid or blood. ? Pus or a bad smell. ? Warmth.  Do not take baths, swim, or use a hot tub until your doctor says it is okay to do so. Activity   Rest at home for 1-2 days or as told by your doctor. ? Avoid sitting for a long time without moving. Get up to take short walks every 1-2 hours.  Return to your normal activities as told by your doctor. Ask what activities are safe for you.  Do not do these things in the first 24 hours: ? Drive. ? Use machinery. ? Take a bath or shower.  Do not lift more than 10 pounds (4.5 kg) or play contact sports for the first 2 weeks. General instructions   Do not drink alcohol in the first week after the procedure.  Have someone stay with you for at least 24 hours after the procedure.  Get your test results. Ask your doctor or the department that is doing the test: ? When will my results be ready? ? How will I get my results? ? What are my treatment options? ? What other tests do I need? ? What are my next steps?  Keep all follow-up visits as told by your doctor. This is important. Contact a doctor if:  A cut bleeds and leaves more than just a small spot of blood.  A cut is red, puffs up (swells), or hurts more than before.  Fluid or something else comes from a cut.  A cut smells bad.  You have a fever or chills. Get help right away if:  You have swelling, bloating, or pain in your belly (abdomen).  You get dizzy or faint.  You have a rash.  You feel sick to your stomach (nauseous) or throw up (vomit).  You have trouble breathing, feel short of breath, or feel  faint.  Your chest hurts.  You have problems talking or seeing.  You have trouble with your balance or moving your arms or legs. Summary  After the procedure, it is common to have pain, soreness, bruising, and tiredness.  Your doctor will tell you how to take care of yourself at home. Change your bandage, take your medicines, and limit your activities as told by your doctor.  Call your doctor if you have symptoms of infection. Get help right away if your belly swells, your cut bleeds a lot, or you have trouble talking or breathing. This information is not intended to replace advice given to you by your health care provider. Make sure you discuss any questions you have with your health care provider. Document Revised: 02/21/2017 Document Reviewed: 02/21/2017 Elsevier Patient Education  2020 [employer].

## 2019-05-18 NOTE — Procedures (Signed)
Interventional Radiology Procedure Note  Procedure: US guided bx liver mass.   Complications: None  Estimated Blood Loss: None  Recommendations: - Bedrest - DC home in 2 hrs  Signed,  Criselda Peaches, MD

## 2019-05-19 ENCOUNTER — Other Ambulatory Visit: Payer: Self-pay

## 2019-05-19 ENCOUNTER — Ambulatory Visit
Admission: RE | Admit: 2019-05-19 | Discharge: 2019-05-19 | Disposition: A | Discharge status: Home / Self Care | Payer: BC Managed Care – PPO | Source: Ambulatory Visit | Attending: Radiation Oncology | Admitting: Radiation Oncology

## 2019-05-19 DIAGNOSIS — Z510 Encounter for antineoplastic radiation therapy: Secondary | ICD-10-CM | POA: Diagnosis not present

## 2019-05-20 ENCOUNTER — Other Ambulatory Visit: Payer: Self-pay

## 2019-05-20 ENCOUNTER — Telehealth: Payer: Self-pay | Admitting: *Deleted

## 2019-05-20 ENCOUNTER — Other Ambulatory Visit: Payer: Self-pay | Admitting: Oncology

## 2019-05-20 ENCOUNTER — Ambulatory Visit
Admission: RE | Admit: 2019-05-20 | Discharge: 2019-05-20 | Disposition: A | Discharge status: Home / Self Care | Payer: BC Managed Care – PPO | Source: Ambulatory Visit | Attending: Radiation Oncology | Admitting: Radiation Oncology

## 2019-05-20 DIAGNOSIS — Z510 Encounter for antineoplastic radiation therapy: Secondary | ICD-10-CM | POA: Diagnosis not present

## 2019-05-20 NOTE — Telephone Encounter (Signed)
Per MD - request for Caris study faxed with receipt as received.

## 2019-05-20 NOTE — Progress Notes (Signed)
Gracious's liver biopsy confirms metastatic carcinoma.  We are obtaining a Caris study to see if we can find additional targets for treatment.

## 2019-05-20 NOTE — Progress Notes (Signed)
[facility]  [phone]) [phone] [phone]) [phone]    ID: Caitlin Wall DOB: May 10, 1955  MR#: [id number]  WPY#:[id number]  Patient Care Team: Biagio Borg, MD as PCP - General Tamala Julian Lynnell Dike, MD as PCP - Cardiology (Cardiology) Jazline Cumbee, Virgie Dad, MD as Consulting Physician (Oncology) Kyung Rudd, MD as Consulting Physician (Radiation Oncology) Jovita Kussmaul, MD as Consulting Physician (General Surgery) Tanda Rockers, MD as Consulting Physician (Pulmonary Disease) Newton Pigg, MD as Consulting Physician (Obstetrics and Gynecology) OTHER MD: Keturah Barre MD   CHIEF COMPLAINT: Metastatic estrogen receptor positive breast cancer  CURRENT TREATMENT: Denosumab/Xgeva; palliative radiation   INTERVAL HISTORY: Caitlin Wall returns today for follow-up and treatment of her metastatic estrogen receptor positive breast cancer.  Her husband did not stop in.  I have let her know that I will be glad to have him come in with her so he can better participate in her care.  He of course drove her here today and is waiting out in the car in the parking lot.  She tells me that he is extremely helpful to her and is doing most of the house chores and cooking right now.  Since her last visit, she underwent spine metastasis screening MRI on 05/12/2019, which revealed: widespread osseous metastatic disease throughout the spine;radiation changes to T6-T9; no evidence of extraosseous tumor extension, pathologic fracture, or primary neural metastasis in cervical, thoracic, or lumbar regions; more extensive metastatic disease within the sacrum, particularly on the right, with possible early tumor extension into foramen on right S1.  She was referred back to Dr. Lisbeth Renshaw on 05/13/2019 to discuss potential palliative radiation therapy to the lumbosacral area. She began treatment this past Wednesday, 05/19/2019, and is scheduled through 06/01/2019.  She also underwent liver biopsy on 05/18/2019. Pathology  from the procedure ([id number]) confirmed metastatic carcinoma. A Caris study has been requested.  She continues on denosumab/Xgeva.    Lab Results  Component Value Date   CA2729 211.7 (H) 05/03/2019   CA2729 178.9 (H) 04/23/2019   CA2729 37.9 03/03/2018     REVIEW OF SYSTEMS: Caitlin Wall continues to be in significant pain.  She tells me yesterday after radiation she went home and all she could do was stay in bed all day.  She is taking the MS Contin at bedtime only because it was making her groggy during the day.  She took the liquid morphine only once yesterday.  She cannot tell me why she did not take it more times since she was having significant pain.  She is doing a little better with bowel prophylaxis and had a bowel movement which was neither very hard nor very loose 2 days ago.  She did have to take some magnesium citrate to accomplish that.  She is feeling short of breath and very anxious.  She is not taking lorazepam.  She does not think she is taking dexamethasone in the morning as she did not recognize that medication when we went over the medication list.  She is quite congested and having hearing problems secondary to sinus allergy issues.  Neither she nor her husband have had the COVID-19 vaccine shots yet.  BREAST CANCER HISTORY:  From the original intake note:   [name] [facility]. She has subpectoral saline implants in place. There was a possible asymmetry in the left breast and she was recalled for left diagnostic mammography with tomosynthesis and left breast ultrasonography 11/03/2014. The breast density was  category C. In the left breast lower outer quadrant there was an irregular mass which was not palpable and which by ultrasonography measured 0.6 cm. The left axilla was sonographically benign. Biopsy of this mass 11/09/2014 showed ([id number]) fibroadipose adipose tissue. This was felt to be discordant.  Accordingly  the patient was referred to surgery and she underwent left breast radioactive seed localized lumpectomy 12/28/2014. The pathology from that procedure (([id number]) showed ductal carcinoma in situ, high-grade, measuring 0.2 cm. This was less than 0.1 cm from the posterior margin. The cells was estrogen receptor positive at 95%, with strong staining intensity, progesterone receptor positive at 30% with moderate staining intensity.  Her subsequent history is as detailed below.   PAST MEDICAL HISTORY: Past Medical History:  Diagnosis Date  . Acute asthma 07/27/2012  . Breast cancer ([place]) 12/28/14   Left BresastDCIS  . COLONIC POLYPS, HX OF 11/04/2007  . DVT (deep venous thrombosis) ([place]) 2018   left leg  . GERD 11/04/2007  . GERD (gastroesophageal reflux disease)   . HAIR LOSS 11/04/2007  . Hearing loss    bilateral - wears hearing aids  . Hx of cardiac cath 11/15/2016   a. LHC 11/15/16 showed normal coronaries and EF 50-55% with normal EDP. (done for false positive NST)  . Hx of colonic polyps 1995   adenomatous  . Hyperlipidemia   . HYPERLIPIDEMIA 11/04/2007  . Hypertension   . HYPERTENSION 06/07/2009  . LBBB (left bundle branch block) 07/26/2011   no current problems  . Personal history of radiation therapy   . S/P radiation therapy 02/02/15-03/24/15   left breast 62.4GY    PAST SURGICAL HISTORY: Past Surgical History:  Procedure Laterality Date  . ABDOMINAL HYSTERECTOMY     partial  . ANTERIOR FUSION CERVICAL SPINE    . bilat ear surgury/hearing loss     wears hearing aids  . BREAST LUMPECTOMY Left 2016  . BREAST LUMPECTOMY WITH RADIOACTIVE SEED LOCALIZATION Left 12/28/2014   Procedure: BREAST LUMPECTOMY WITH RADIOACTIVE SEED LOCALIZATION;  Surgeon: Autumn Messing III, MD;  Location: [facility];  Service: General;  Laterality: Left;  . COLONOSCOPY    . LEFT HEART CATH AND CORONARY ANGIOGRAPHY N/A 11/15/2016   Procedure: LEFT HEART CATH AND CORONARY ANGIOGRAPHY;  Surgeon:  Belva Crome, MD;  Location: [facility];  Service: Cardiovascular;  Laterality: N/A;  . POLYPECTOMY    . UPPER GASTROINTESTINAL ENDOSCOPY    . VULVECTOMY PARTIAL N/A 02/02/2019   Procedure: VULVECTOMY PARTIAL;  Surgeon: Newton Pigg, MD;  Location: [facility];  Service: Gynecology;  Laterality: N/A;  . WISDOM TOOTH EXTRACTION      FAMILY HISTORY: Family History  Problem Relation Age of Onset  . Breast cancer Other   . Cancer Paternal Grandmother        breast  . Lung cancer Father   . Breast cancer Maternal Grandmother   . Healthy Mother   . Coronary artery disease Neg Hx   . Colon cancer Neg Hx   . Pancreatic cancer Neg Hx   . Rectal cancer Neg Hx   . Stomach cancer Neg Hx   . Esophageal cancer Neg Hx    [name] died from lung cancer in the setting of tobacco abuse at age 63. [name] died from "old age" at age 34. The patient had one brother, 3 sisters. The brother had" I cancer" requiring enucleation--the patient does not know whether this was a primary ocular  melanoma. [name] was diagnosed with breast cancer but the patient does not know at what age. There is no other history of breast or ovarian cancer in the family   GYNECOLOGIC HISTORY:  No LMP recorded (lmp unknown). Patient has had a hysterectomy. Menarche age 52, first live birth age 49. The patient is GX P3. She is status post hysterectomy without salpingo-oophorectomy. She did not take hormone replacement. She did use oral contraceptives remotely without any complications.   SOCIAL HISTORY:  Lacrystal works in accounts payable. Her husband [name] is disabled because of back problems. [name] is a Marine scientist working in rehabilitation in [place]. [name] lives in [place] where he works as a Games developer. [name] lives in [place] where he works as a Emergency planning/management officer. The patient has 4 grandchildren. She is not a Scientist, research (life sciences).    ADVANCED DIRECTIVES: Not in place   HEALTH MAINTENANCE: Social History   Tobacco Use  . Smoking status: Former Smoker    Packs/day: 1.00    Years: 20.00    Pack years: 20.00    Types: Cigarettes    Quit date: 08/19/1989    Years since quitting: 29.7  . Smokeless tobacco: Never Used  Substance Use Topics  . Alcohol use: Yes    Comment: rare  . Drug use: No    Colonoscopy: 10/2018, repeat 2027 (Dr. Fuller Plan)  PAP: s/p hysterectomy  Bone density: 02/2018, -1.7  Allergies  Allergen Reactions  . Codeine     REACTION: nausea,  . Tramadol Hcl Nausea Only    Current Outpatient Medications  Medication Sig Dispense Refill  . aspirin EC 81 MG tablet Take 81 mg by mouth daily.    . budesonide-formoterol (SYMBICORT) 160-4.5 MCG/ACT inhaler Inhale 2 puffs into the lungs 2 (two) times daily. 1 Inhaler 3  . capecitabine (XELODA) 500 MG tablet Take 3 tablets (1,500 mg total) by mouth 2 (two) times daily after a meal. Take for 14 days, then do not take for 7 days, then repeat. (Patient not taking: Reported on 05/13/2019) 84 tablet 4  . cholecalciferol (VITAMIN D3) 25 MCG (1000 UT) tablet Take 1 tablet (1,000 Units total) by mouth daily.    Marland Kitchen dexamethasone (DECADRON) 2 MG tablet Take 2 tablets (4 mg total) by mouth in the morning. 60 tablet 1  . DULoxetine (CYMBALTA) 30 MG capsule Take 1 capsule (30 mg total) by mouth daily. (Patient not taking: Reported on 05/13/2019) 30 capsule 0  . loratadine (CLARITIN) 10 MG tablet Take 1 tablet (10 mg total) by mouth daily. 90 tablet 4  . magnesium citrate SOLN Take 1 Bottle by mouth once. Take once today    . morphine (MS CONTIN) 15 MG 12 hr tablet Take 1 tablet (15 mg total) by mouth every 8 (eight) hours. 90 tablet 0  . morphine 10 MG/5ML solution Take 2.5-5 mLs (5-10 mg total) by mouth every 4 (four) hours as needed for severe pain. 100 mL 0  . ondansetron (ZOFRAN) 4 MG tablet Take 1 tablet (4 mg total) by mouth 3 (three) times daily before  meals. 60 tablet 4  . polyethylene glycol (MIRALAX / GLYCOLAX) 17 g packet Take 17 g by mouth daily. To prevent constipation from pain medication    . senna-docusate (SENOKOT-S) 8.6-50 MG tablet Take 2 tablets by mouth 2 (two) times daily. To prevent constipation from pain medication     No current facility-administered medications for this visit.    OBJECTIVE: Middle-aged white woman  examined in a wheelchair  Vitals:   05/21/19 0824  BP: (!) 102/59  Pulse: (!) 117  Resp: 18  Temp: 98.3 F (36.8 C)  SpO2: 97%     Body mass index is 25.69 kg/m.    ECOG FS:1 - Symptomatic but completely ambulatory  Filed Weights   05/21/19 0824  Weight: 154 lb 6.4 oz (70 kg)     Sclerae unicteric, EOMs intact Wearing a mask No cervical or supraclavicular adenopathy Lungs no wheezes Heart regular rate and rhythm Abd soft, nontender, positive bowel sounds Neuro: nonfocal, well oriented, anxious and depressed affect Breasts: Deferred   LAB RESULTS:  CMP     Component Value Date/Time   NA 138 05/18/2019 1100   NA 141 04/25/2017 1213   NA 145 07/04/2016 1133   K 4.4 05/18/2019 1100   K 4.2 07/04/2016 1133   CL 98 05/18/2019 1100   CO2 23 05/18/2019 1100   CO2 28 07/04/2016 1133   GLUCOSE 131 (H) 05/18/2019 1100   GLUCOSE 101 07/04/2016 1133   BUN 26 (H) 05/18/2019 1100   BUN 18 04/25/2017 1213   BUN 16.0 07/04/2016 1133   CREATININE 1.42 (H) 05/18/2019 1100   CREATININE 1.09 (H) 07/23/2018 1339   CREATININE 0.9 07/04/2016 1133   CALCIUM 9.3 05/18/2019 1100   CALCIUM 9.9 07/04/2016 1133   PROT 6.8 05/18/2019 1100   PROT 7.3 07/04/2016 1133   ALBUMIN 2.4 (L) 05/18/2019 1100   ALBUMIN 3.8 07/04/2016 1133   AST 212 (H) 05/18/2019 1100   AST 35 07/23/2018 1339   AST 19 07/04/2016 1133   ALT 50 (H) 05/18/2019 1100   ALT 25 07/23/2018 1339   ALT 17 07/04/2016 1133   ALKPHOS 234 (H) 05/18/2019 1100   ALKPHOS 172 (H) 07/04/2016 1133   BILITOT 2.3 (H) 05/18/2019 1100   BILITOT  1.1 07/23/2018 1339   BILITOT 0.94 07/04/2016 1133   GFRNONAA 39 (L) 05/18/2019 1100   GFRNONAA 55 (L) 07/23/2018 1339   GFRAA 46 (L) 05/18/2019 1100   GFRAA >60 07/23/2018 1339    INo results found for: SPEP, UPEP  Lab Results  Component Value Date   WBC 11.0 (H) 05/21/2019   NEUTROABS 8.9 (H) 05/21/2019   HGB 12.3 05/21/2019   HCT 37.9 05/21/2019   MCV 102.2 (H) 05/21/2019   PLT 147 (L) 05/21/2019      Chemistry      Component Value Date/Time   NA 138 05/18/2019 1100   NA 141 04/25/2017 1213   NA 145 07/04/2016 1133   K 4.4 05/18/2019 1100   K 4.2 07/04/2016 1133   CL 98 05/18/2019 1100   CO2 23 05/18/2019 1100   CO2 28 07/04/2016 1133   BUN 26 (H) 05/18/2019 1100   BUN 18 04/25/2017 1213   BUN 16.0 07/04/2016 1133   CREATININE 1.42 (H) 05/18/2019 1100   CREATININE 1.09 (H) 07/23/2018 1339   CREATININE 0.9 07/04/2016 1133      Component Value Date/Time   CALCIUM 9.3 05/18/2019 1100   CALCIUM 9.9 07/04/2016 1133   ALKPHOS 234 (H) 05/18/2019 1100   ALKPHOS 172 (H) 07/04/2016 1133   AST 212 (H) 05/18/2019 1100   AST 35 07/23/2018 1339   AST 19 07/04/2016 1133   ALT 50 (H) 05/18/2019 1100   ALT 25 07/23/2018 1339   ALT 17 07/04/2016 1133   BILITOT 2.3 (H) 05/18/2019 1100   BILITOT 1.1 07/23/2018 1339   BILITOT 0.94 07/04/2016 1133  No results found for: LABCA2  No components found for: LABCA125  Recent Labs  Lab 05/18/19 1100  INR 1.3*    Urinalysis    Component Value Date/Time   COLORURINE YELLOW 07/20/2018 [place] 07/20/2018 1747   LABSPEC 1.019 07/20/2018 1747   PHURINE 5.0 07/20/2018 1747   GLUCOSEU NEGATIVE 07/20/2018 1747   GLUCOSEU NEGATIVE 10/20/2017 1303   HGBUR NEGATIVE 07/20/2018 1747   BILIRUBINUR NEGATIVE 07/20/2018 1747   KETONESUR 20 (A) 07/20/2018 1747   PROTEINUR NEGATIVE 07/20/2018 1747   UROBILINOGEN 1.0 10/20/2017 1303   NITRITE NEGATIVE 07/20/2018 1747   LEUKOCYTESUR NEGATIVE 07/20/2018 1747     STUDIES: DG Chest 2 View  Result Date: 04/23/2019 CLINICAL DATA:  64 year old female with history of severe shortness of breath. EXAM: CHEST - 2 VIEW COMPARISON:  Chest x-ray 05/13/2018. FINDINGS: Emphysematous changes in the lungs bilaterally. Irregular opacities are noted in the area of the lower lobes bilaterally, new compared to the prior chest x-ray, but corresponding to areas seen on prior chest CT 11/11/2018. No pleural effusions. No evidence of pulmonary edema. Heart size is normal. Upper mediastinal contours are within normal limits. Aortic atherosclerosis. IMPRESSION: 1. As seen on prior chest CT 11/11/2018, there are regular opacities in the medial aspects of the lower lobes of the lungs bilaterally, which are likely sequela of prior radiation therapy. No other acute findings are noted on today's examination. 2. Aortic atherosclerosis. Electronically Signed   By: Vinnie Langton M.D.   On: 04/23/2019 10:11   CT Chest Wo Contrast  Result Date: 04/30/2019 CLINICAL DATA:  Left breast cancer.  Restaging. EXAM: CT CHEST WITHOUT CONTRAST TECHNIQUE: Multidetector CT imaging of the chest was performed following the standard protocol without IV contrast. COMPARISON:  CT chest 11/11/2018 FINDINGS: Cardiovascular: The heart size appears within normal limits. No pericardial effusion. Aortic atherosclerosis. Mediastinum/Nodes: No enlarged mediastinal or axillary lymph nodes. Thyroid gland, trachea, and esophagus demonstrate no significant findings. Lungs/Pleura: No pleural effusion. Advanced changes of paraseptal and centrilobular emphysema. Bilateral lower lobe paravertebral fibrosis and masslike architectural distortion noted compatible with progressive changes of external beam radiation. Small nonspecific nodule within the posterior left lower lobe is new from previous exam measuring 4 mm, image 97/5. Upper Abdomen: Interval development of multifocal areas of low attenuation involving both lobes of  liver concerning for metastatic disease. Index lesion within right hepatic lobe measures 2.5 cm, image 117/2. Index lesion within segment 5 measures 3.9 cm, image 151/2. Index lesion in lateral segment of left lobe of liver measures 3.6 cm, image 128/2. Musculoskeletal: Multifocal sclerotic metastases identified within the bony thorax. This is similar to previous exam. IMPRESSION: 1. Interval development of multifocal low-attenuation lesions within the liver worrisome for metastatic disease. More definitive characterization with contrast enhanced liver protocol MRI or CT advised. 2. Progressive changes of external beam radiation within the paravertebral lower lobes. 3. New small nonspecific lung nodule within the left lower lobe measures 4 mm. Attention on follow-up imaging. 4. Similar appearance of multifocal sclerotic bone metastases. Aortic Atherosclerosis (ICD10-I70.0) and Emphysema (ICD10-J43.9). Electronically Signed   By: Kerby Moors M.D.   On: 04/30/2019 09:38   NM Bone Scan Whole Body  Result Date: 04/30/2019 CLINICAL DATA:  Metastatic breast cancer. EXAM: NUCLEAR MEDICINE WHOLE BODY BONE SCAN TECHNIQUE: Whole body anterior and posterior images were obtained approximately 3 hours after intravenous injection of radiopharmaceutical. RADIOPHARMACEUTICALS:  22.0 mCi Technetium-46mMDP IV COMPARISON:  November 11, 2018. FINDINGS: There has been interval increased number  of bilateral rib abnormally increased foci of radiotracer uptake as well as new areas of radiotracer uptake in the thoracolumbar and sacral spine and ischia, and probably in the bilateral skull. IMPRESSION: Interval progression of osteoblastic metastatic disease in the axial skeleton and ribcage, and probably the skull. MR brain without and with intravenous contrast recommended. Electronically Signed   By: Revonda Humphrey   On: 04/30/2019 21:07   NM Pulmonary Perf and Vent  Result Date: 04/23/2019 CLINICAL DATA:  Short of breath.   Asthma, COPD. EXAM: NUCLEAR MEDICINE VENTILATION - PERFUSION LUNG SCAN TECHNIQUE: Ventilation images were obtained in multiple projections using inhaled aerosol Tc-65mDTPA. Perfusion images were obtained in multiple projections after intravenous injection of Tc-92mAA. RADIOPHARMACEUTICALS:  1.4 mCi of Tc-9924mPA aerosol inhalation and 1.4 mCi Tc99m35m IV COMPARISON:  CT 11/16/2018, chest radiograph 04/23/2019 FINDINGS: Ventilation: Decreased ventilation in the lateral RIGHT upper lobe. Decreased ventilation the medial aspect of the LEFT lower lobe on posterior projection. Perfusion: Perfusion defect in the lateral aspect of the RIGHT upper lobe matches the ventilation defect. Additionally there is a focal emphysema in the lateral aspect the RIGHT upper lobe corresponding to this perfusion defect. Decreased perfusion to the medial LEFT lower lobe also matches the ventilation defect and there is emphysema / bronchiectasis on comparison CT. IMPRESSION: 1. Matched perfusion ventilation defects correspond to focal emphysema on comparison CT. 2. No evidence acute pulmonary embolism. Electronically Signed   By: StewSuzy Bouchard.   On: 04/23/2019 15:43   US BKoreaPSY (LIVER)  Result Date: 05/18/2019 INDICATION: 64 y7r old female with breast cancer and multifocal hepatic lesions concerning for metastatic disease. She presents for ultrasound-guided core biopsy of the same. EXAM: ULTRASOUND BIOPSY CORE LIVER MEDICATIONS: None. ANESTHESIA/SEDATION: Moderate (conscious) sedation was employed during this procedure. A total of Versed 2 mg and Fentanyl 100 mcg was administered intravenously. Moderate Sedation Time: 14 minutes. The patient's level of consciousness and vital signs were monitored continuously by radiology nursing throughout the procedure under my direct supervision. FLUOROSCOPY TIME:  None. COMPLICATIONS: None immediate. PROCEDURE: Informed written consent was obtained from the patient after a thorough  discussion of the procedural risks, benefits and alternatives. All questions were addressed. Maximal Sterile Barrier Technique was utilized including caps, mask, sterile gowns, sterile gloves, sterile drape, hand hygiene and skin antiseptic. A timeout was performed prior to the initiation of the procedure. Ultrasound was used to interrogate the liver. Multiple hypoechoic solid liver lesions are identified. A suitable lesion in hepatic segment 5 was selected. A skin entry site was marked. The overlying skin was sterilely prepped and draped in standard fashion using chlorhexidine skin prep. Local anesthesia was attained by infiltration with 1% lidocaine. A small dermatotomy was made. Under real-time ultrasound guidance, a 17 gauge introducer needle was advanced through the liver and positioned at the margin of the mass. Multiple 18 gauge core biopsies were then coaxially obtained using the bio Pince automated biopsy device. Biopsy specimens were placed in formalin and delivered to pathology for further analysis. As the introducer needle was removed, the biopsy tract was embolized with a Gel-Foam slurry. Post biopsy ultrasound imaging demonstrates no evidence of immediate complication. IMPRESSION: Technically successful ultrasound-guided core biopsy of liver lesion. Signed, HeatCriselda Peaches, RPVITocco[facility] Electronically Signed   By: HeatJacqulynn Cadet.   On: 05/18/2019 18:35   MR TOTAL SPINE METS SCREENING  Result Date: 05/12/2019 CLINICAL DATA:  Widely metastatic breast cancer. Burning and numbness  in the legs. Evaluate for cord compression. EXAM: MRI TOTAL SPINE WITHOUT AND WITH CONTRAST TECHNIQUE: Multisequence MR imaging of the spine from the cervical spine to the sacrum was performed prior to and following IV contrast administration for evaluation of spinal metastatic disease. CONTRAST:  20m GADAVIST GADOBUTROL 1 MMOL/ML IV SOLN  COMPARISON:  Nuclear medicine bone scan 04/30/2019 FINDINGS: MRI CERVICAL SPINE FINDINGS Alignment: Normal Vertebrae: Osseous metastatic disease throughout the cervical region. Fusion at the C5-6 level, probably congenital. No evidence of lytic destructive bone lesion or extraosseous extension tumor. Cord: No evidence of cord metastasis. Posterior Fossa, vertebral arteries, paraspinal tissues: No significant regional soft tissue lesion seen. Disc levels: Mild non-compressive degenerative changes at C1-2, C2-3 and C3-4. At C4-5, there is degenerative spondylosis with endplate osteophytes and bulging of the disc. AP diameter of the canal is only 6.5 mm. The subarachnoid space is effaced in the cord is indented slightly. There is bilateral foraminal narrowing. No stenosis at the C5-6 fusion level. Mild non-compressive spondylosis at C6-7 and C7-T1. MRI THORACIC SPINE FINDINGS Alignment:  Normal Vertebrae: Widespread osseous metastatic disease. Previous radiation change of the T6, T7, T8 and T9 vertebrae with fatty change. Small metastases are present within the fatty areas. The other non radiated thoracic vertebral bodies show more extensive and confluent intra osseous tumor. There is no evidence of pathologic fracture. There is no visible extraosseous tumor extension. Cord: No evidence of primary cord or leptomeningeal metastatic disease. Paraspinal and other soft tissues: No significant finding. Disc levels: No significant thoracic disc disease. Mild non-compressive disc bulge at T11-12. MRI LUMBAR SPINE FINDINGS Segmentation:  5 lumbar type vertebral bodies. Alignment:  3 mm degenerative retrolisthesis L1-2. Vertebrae: Widespread osseous metastatic disease throughout the lumbar spine and the sacrum. Disease is more pronounced in the sacrum, particularly on the right. In the lumbar region, there is no fracture or extraosseous tumor extension. In the sacrum, there is probably some early tumor extension in the right S1  foramen that could affect that nerve. Conus medullaris: Extends to the L1 level and appears normal. Paraspinal and other soft tissues: Negative.  Not studied in detail. Disc levels: No significant disc pathology. No compressive stenosis of the canal. Foramina sufficiently patent. IMPRESSION: Widespread osseous metastatic disease throughout the spine. Fatty change of the marrow from T6 through T9 consistent with previous radiation. No evidence of extraosseous tumor extension, pathologic fracture or primary neural metastasis in the cervical, thoracic or lumbar region. More extensive metastatic disease within the sacrum, particularly on the right. I think there is early tumor extension into the foramen on the right at the S1 foramen, which could explain right S1 symptomatology. Electronically Signed   By: MNelson ChimesM.D.   On: 05/12/2019 15:35     ASSESSMENT: 64y.o. [place] woman status post left lumpectomy 12/28/2014 for ductal carcinoma in situ, high-grade measuring 0.2 cm, estrogen and progesterone receptor positive, with close but negative margins  (1) adjuvant radiation 02/02/2015 through 03/24/2015: The patient initially received a dose of 50.4 Gy in 28 fractions to the breast using whole-breast tangent fields. This was delivered using a 3-D conformal technique. The patient then received a boost to the seroma. This delivered an additional 10 Gy in 5 fractions using a en face electron field. The total dose was 60.4 Gy.   (2) anastrozole started 04/19/2015   (a) bone density 03/01/2016 shows a T score of -1.2  (3) mildly abnormal hepatic function panel: negative workup for hepatitis B and C, normal  alpha-1-anti-trypsin  (4) left lower extremity DVT diagnosed by Doppler ultrasonography 04/16/2016, with negative CT angiogram chest  (a) on Xarelto starting 04/16/2016   (b) negative hypercoagulable panel, with a minimally abnormal (indeterminate) anti-cardiolipin IgM  ((c) stopped  rivaroxaban 07/16/2016, repeat D-Dimer WNL August 2018  METASTATIC DISEASE: December 2019: bone only (5) CT chest 02/06/2018 shows a mixed lytic and sclerotic process with fracture at the inferior manubrium but no other bone lesions.  However bone scan 02/16/2018 shows multiple bone lesions consistent with metastatic disease; no lung or pleura involvement, no adenopathy  (a) Bone marrow biopsy on 03/03/2018 shows metastatic carcinoma consistent with breast primary, estrogen and progesterone receptor strongly positive, HER-2 not amplified (1+)  (b) MRI abdomen and MRI brain at [facility] 03/17/2018 (results viewable in "care everywhere") notes known bone involvement in spine and pelvis, but no other area of metastatic disease in abdomen/pelvis, or brain.  (c) CA 27.29 not informative (37.9 on 03/03/2018)  (6) started denosumab/Xgeva on 03/18/2018, repeated Q28 d  (7) started fulvestrant on 03/03/2018 and palbociclib 03/18/2018 at 125 mg/d 21/7  (a) bone scan 06/10/2018 serves as our new baseline  (b) chest CT scan on 11/11/2018 shows multiple sclerotic bone lesions (treatment effect) no visceral lesions; lungs also show radiation change  (c) CT angio 11/16/2018 shows no evidence of pulmonary embolism  (d) V/Q scan 04/23/2019 read as low probability  (e) fulvestrant and palbiciclib discontinued with disease progression (bone and liver)  (8) palliative radiation 07/01/2018-07/14/2018:  The patient received 30 Gy in 10 fractions to bilateral ribs and lower thoracic spine  (a) esophagitis secondary to radiation has resolved  (9) pain management/bowel prophylaxis  (a) currently on MS Contin 15 mg Qhs with morphine liquid 10 mg and 5 cc Q6h as needed  (b) bowel prophylaxis  (10) palliative radiation to lumbar area to be completed 06/01/2019  (11) to start capecitabine 06/02/2019  (a) chest CT scan shows new liver lesions, bone scan shows new bone lesions 04/30/2019  (12) liver biopsy 05/18/2019  confirms metastatic breast cancer to the liver  (a) CAR[facility] molecular studies requested 05/20/2019   PLAN: Shekita is tolerating her radiation treatments moderately well.  They have not yet begun to make her pain less.  She wondered why she was having pain in the right leg in particular and we went over the fact that she does have cancer in the bones and that is the reason for the pain.  We again went over the fact that the goal of pain medication is to allow her to function more normally.  The goal is not for her to be in bed all day because if she moves she hurts.  The idea is for her to take pain medicine so she can go to the kitchen do a little cooking maybe take a drug with her husband.  I reassured her that she is not going to die from an overdose and she is not going to become an addict from taking pain medicine.  She brought me FMLA documents for herself her husband and her daughter and we will try to get those done for her within the next 3 days.  I have encouraged her to bring her husband into visits so that he can better participate in her care.  I refilled her Decadron which apparently she had not obtained and explained to her that this will help her breathing and appetite and also may help the pain.  She is doing better on the bowel prophylaxis  and I commended her for that.  We discussed her liver biopsy.  She understands this confirms that she does have metastatic breast cancer in the liver.  The reason we did the biopsy though is so we could obtain molecular studies that may give [place] additional targets for treatment.  Those will not be available for at least 2weeks.  In any case our next step in terms of treatment will be capecitabine.  She will see me on May 30, 2019, which is her last radiation day.  We will start the capecitabine the next day.  We are going to go with 3tablets twice daily 14 days on and 7 days off if she can tolerate it.  The plan would be to do this for about 3 months  and then restage  I encouraged her to get the COVID-19 vaccination as soon as it becomes available to her and her husband  I asked her to call us for any other issue that may develop before the next visit.  Total encounter time 30 minutes.    Virgie Dad. Gaetano Romberger, MD 05/21/19 8:57 AM Medical Oncology and Hematology [facility] [place], [place] [address] Tel. [phone]    Fax. [phone]   I, Wilburn Mylar, am acting as scribe for Dr. Virgie Dad. Jem Castro.  I, Lurline Del MD, have reviewed the above documentation for accuracy and completeness, and I agree with the above.    *Total Encounter Time as defined by the Centers for Medicare and Medicaid Services includes, in addition to the face-to-face time of a patient visit (documented in the note above) non-face-to-face time: obtaining and reviewing outside history, ordering and reviewing medications, tests or procedures, care coordination (communications with other health care professionals or caregivers) and documentation in the medical record.

## 2019-05-21 ENCOUNTER — Inpatient Hospital Stay (HOSPITAL_BASED_OUTPATIENT_CLINIC_OR_DEPARTMENT_OTHER): Payer: BC Managed Care – PPO | Admitting: Oncology

## 2019-05-21 ENCOUNTER — Other Ambulatory Visit: Payer: Self-pay

## 2019-05-21 ENCOUNTER — Telehealth: Payer: Self-pay | Admitting: Oncology

## 2019-05-21 ENCOUNTER — Ambulatory Visit
Admission: RE | Admit: 2019-05-21 | Discharge: 2019-05-21 | Disposition: A | Discharge status: Home / Self Care | Payer: BC Managed Care – PPO | Source: Ambulatory Visit | Attending: Radiation Oncology | Admitting: Radiation Oncology

## 2019-05-21 ENCOUNTER — Inpatient Hospital Stay: Payer: BC Managed Care – PPO

## 2019-05-21 DIAGNOSIS — Z170 Estrogen receptor positive status [ER+]: Secondary | ICD-10-CM | POA: Diagnosis not present

## 2019-05-21 DIAGNOSIS — C50512 Malignant neoplasm of lower-outer quadrant of left female breast: Secondary | ICD-10-CM

## 2019-05-21 DIAGNOSIS — C787 Secondary malignant neoplasm of liver and intrahepatic bile duct: Secondary | ICD-10-CM | POA: Diagnosis not present

## 2019-05-21 DIAGNOSIS — Z510 Encounter for antineoplastic radiation therapy: Secondary | ICD-10-CM | POA: Diagnosis not present

## 2019-05-21 DIAGNOSIS — C7951 Secondary malignant neoplasm of bone: Secondary | ICD-10-CM

## 2019-05-21 DIAGNOSIS — I824Z2 Acute embolism and thrombosis of unspecified deep veins of left distal lower extremity: Secondary | ICD-10-CM

## 2019-05-21 LAB — CBC WITH DIFFERENTIAL/PLATELET
Abs Immature Granulocytes: 0.31 10*3/uL — ABNORMAL HIGH (ref 0.00–0.07)
Basophils Absolute: 0.1 10*3/uL (ref 0.0–0.1)
Basophils Relative: 1 %
Eosinophils Absolute: 0 10*3/uL (ref 0.0–0.5)
Eosinophils Relative: 0 %
HCT: 37.9 % (ref 36.0–46.0)
Hemoglobin: 12.3 g/dL (ref 12.0–15.0)
Immature Granulocytes: 3 %
Lymphocytes Relative: 7 %
Lymphs Abs: 0.7 10*3/uL (ref 0.7–4.0)
MCH: 33.2 pg (ref 26.0–34.0)
MCHC: 32.5 g/dL (ref 30.0–36.0)
MCV: 102.2 fL — ABNORMAL HIGH (ref 80.0–100.0)
Monocytes Absolute: 1 10*3/uL (ref 0.1–1.0)
Monocytes Relative: 9 %
Neutro Abs: 8.9 10*3/uL — ABNORMAL HIGH (ref 1.7–7.7)
Neutrophils Relative %: 80 %
Platelets: 147 10*3/uL — ABNORMAL LOW (ref 150–400)
RBC: 3.71 MIL/uL — ABNORMAL LOW (ref 3.87–5.11)
RDW: 15.2 % (ref 11.5–15.5)
WBC: 11 10*3/uL — ABNORMAL HIGH (ref 4.0–10.5)
nRBC: 0.6 % — ABNORMAL HIGH (ref 0.0–0.2)

## 2019-05-21 LAB — COMPREHENSIVE METABOLIC PANEL
ALT: 65 U/L — ABNORMAL HIGH (ref 0–44)
AST: 305 U/L (ref 15–41)
Albumin: 2 g/dL — ABNORMAL LOW (ref 3.5–5.0)
Alkaline Phosphatase: 442 U/L — ABNORMAL HIGH (ref 38–126)
Anion gap: 15 (ref 5–15)
BUN: 18 mg/dL (ref 8–23)
CO2: 20 mmol/L — ABNORMAL LOW (ref 22–32)
Calcium: 9.6 mg/dL (ref 8.9–10.3)
Chloride: 104 mmol/L (ref 98–111)
Creatinine, Ser: 1.06 mg/dL — ABNORMAL HIGH (ref 0.44–1.00)
GFR calc Af Amer: >60 mL/min (ref >60)
GFR calc non Af Amer: 56 mL/min — ABNORMAL LOW (ref >60)
Glucose, Bld: 133 mg/dL — ABNORMAL HIGH (ref 70–99)
Potassium: 3.9 mmol/L (ref 3.5–5.1)
Sodium: 139 mmol/L (ref 135–145)
Total Bilirubin: 2.5 mg/dL — ABNORMAL HIGH (ref 0.3–1.2)
Total Protein: 6.5 g/dL (ref 6.5–8.1)

## 2019-05-21 LAB — SURGICAL PATHOLOGY

## 2019-05-21 MED ORDER — DEXAMETHASONE 2 MG PO TABS: 4.0000 mg | ORAL_TABLET | Freq: Every morning | ORAL | 1 refills | Status: AC

## 2019-05-21 MED ORDER — LORATADINE 10 MG PO TABS: 10.0000 mg | ORAL_TABLET | Freq: Every day | ORAL | 4 refills | Status: AC

## 2019-05-21 NOTE — Telephone Encounter (Signed)
Scheduled appt per 3/25 los. Pt confirmed appt date and time.

## 2019-05-24 ENCOUNTER — Ambulatory Visit
Admission: RE | Admit: 2019-05-24 | Discharge: 2019-05-24 | Disposition: A | Discharge status: Home / Self Care | Payer: BC Managed Care – PPO | Source: Ambulatory Visit | Attending: Radiation Oncology | Admitting: Radiation Oncology

## 2019-05-24 ENCOUNTER — Other Ambulatory Visit: Payer: Self-pay

## 2019-05-24 DIAGNOSIS — Z510 Encounter for antineoplastic radiation therapy: Secondary | ICD-10-CM | POA: Diagnosis not present

## 2019-05-25 ENCOUNTER — Other Ambulatory Visit: Payer: Self-pay

## 2019-05-25 ENCOUNTER — Ambulatory Visit
Admission: RE | Admit: 2019-05-25 | Discharge: 2019-05-25 | Disposition: A | Discharge status: Home / Self Care | Payer: BC Managed Care – PPO | Source: Ambulatory Visit | Attending: Radiation Oncology | Admitting: Radiation Oncology

## 2019-05-25 DIAGNOSIS — Z510 Encounter for antineoplastic radiation therapy: Secondary | ICD-10-CM | POA: Diagnosis not present

## 2019-05-26 ENCOUNTER — Other Ambulatory Visit: Payer: Self-pay

## 2019-05-26 ENCOUNTER — Ambulatory Visit
Admission: RE | Admit: 2019-05-26 | Discharge: 2019-05-26 | Disposition: A | Discharge status: Home / Self Care | Payer: BC Managed Care – PPO | Source: Ambulatory Visit | Attending: Radiation Oncology | Admitting: Radiation Oncology

## 2019-05-26 DIAGNOSIS — Z510 Encounter for antineoplastic radiation therapy: Secondary | ICD-10-CM | POA: Diagnosis not present

## 2019-05-27 ENCOUNTER — Other Ambulatory Visit: Payer: Self-pay

## 2019-05-27 ENCOUNTER — Ambulatory Visit
Admission: RE | Admit: 2019-05-27 | Discharge: 2019-05-27 | Disposition: A | Discharge status: Home / Self Care | Payer: BC Managed Care – PPO | Source: Ambulatory Visit | Attending: Radiation Oncology | Admitting: Radiation Oncology

## 2019-05-27 DIAGNOSIS — C50512 Malignant neoplasm of lower-outer quadrant of left female breast: Secondary | ICD-10-CM | POA: Insufficient documentation

## 2019-05-27 DIAGNOSIS — C7951 Secondary malignant neoplasm of bone: Secondary | ICD-10-CM | POA: Insufficient documentation

## 2019-05-27 DIAGNOSIS — Z170 Estrogen receptor positive status [ER+]: Secondary | ICD-10-CM | POA: Diagnosis not present

## 2019-05-27 DIAGNOSIS — Z510 Encounter for antineoplastic radiation therapy: Secondary | ICD-10-CM | POA: Diagnosis not present

## 2019-05-28 ENCOUNTER — Ambulatory Visit
Admission: RE | Admit: 2019-05-28 | Discharge: 2019-05-28 | Disposition: A | Discharge status: Home / Self Care | Payer: BC Managed Care – PPO | Source: Ambulatory Visit | Attending: Radiation Oncology | Admitting: Radiation Oncology

## 2019-05-28 ENCOUNTER — Other Ambulatory Visit: Payer: Self-pay

## 2019-05-28 DIAGNOSIS — Z510 Encounter for antineoplastic radiation therapy: Secondary | ICD-10-CM | POA: Diagnosis not present

## 2019-05-30 NOTE — Progress Notes (Signed)
Caitlin Wall  [phone] [phone]    ID: Caitlin Wall DOB: 02-01-1956  MR#: [id number]  PHK#:[id number]  Patient Care Team: Biagio Borg, Wall as PCP - General Tamala Julian Lynnell Dike, Wall as PCP - Cardiology (Cardiology) Kayen Grabel, Virgie Dad, Wall as Consulting Physician (Oncology) Kyung Rudd, Wall as Consulting Physician (Radiation Oncology) Jovita Kussmaul, Wall as Consulting Physician (General Surgery) Tanda Rockers, Wall as Consulting Physician (Pulmonary Disease) Newton Pigg, Wall as Consulting Physician (Obstetrics and Gynecology) OTHER Wall: Keturah Barre Wall   CHIEF COMPLAINT: Metastatic estrogen receptor positive breast cancer  CURRENT TREATMENT: Denosumab/Xgeva; capecitabine   INTERVAL HISTORY: Caitlin Wall returns today for follow-up and treatment of her metastatic estrogen receptor positive breast cancer.    She is receiving palliative radiation therapy to the lumbosacral area. Today is her final treatment.  She thinks she has done well with this treatment although she has not yet noted any significant improvement in her pain.  She tells me she did not take her long-acting pain medicine last night to see how she would feel this morning.  She takes the quick acting medication during the day but frequently she does not.  She is usually constipated but had some loose bowel movements last night and this morning  The plan is to start her on capecitabine tomorrow, which she will take 3 tablets twice daily 14 days on and 7 days off if she can tolerate it.  We have discussed the side effects toxicities and complications in detail  She continues on denosumab/Xgeva.  She tolerates this with no side effects that she is aware of  Lab Results  Component Value Date   CA2729 597.0 (H) 05/31/2019   CA2729 211.7 (H) 05/03/2019   CA2729 178.9 (H) 04/23/2019   CA2729 37.9 03/03/2018    REVIEW OF SYSTEMS: Caitlin Wall tells me she has no appetite.  She feels nauseated and  actually vomits sometimes.  She has no swallowing difficulty.  She feels weak.  She feels short of breath.  Her stomach does not feel right.  She feels dizzy at times but there have been no falls.  She has a white coating on her tongue.  She is no longer working and has applied for disability.  She tells me her husband is doing all the housework cooking and shopping.  She describes him as very helpful.   BREAST CANCER HISTORY:  From the original intake note:   Caitlin Wall [facility]. She has subpectoral saline implants in place. There was a possible asymmetry in the left breast and she was recalled for left diagnostic mammography with tomosynthesis and left breast ultrasonography 11/03/2014. The breast density was category C. In the left breast lower outer quadrant there was an irregular mass which was not palpable and which by ultrasonography measured 0.6 cm. The left axilla was sonographically benign. Biopsy of this mass 11/09/2014 showed ([id number]) fibroadipose adipose tissue. This was felt to be discordant.  Accordingly the patient was referred to surgery and she underwent left breast radioactive seed localized lumpectomy 12/28/2014. The pathology from that procedure (([id number]) showed ductal carcinoma in situ, high-grade, measuring 0.2 cm. This was less than 0.1 cm from the posterior margin. The cells was estrogen receptor positive at 95%, with strong staining intensity, progesterone receptor positive at 30% with moderate staining intensity.  Her subsequent history is as detailed below.   PAST MEDICAL HISTORY: Past Medical History:  Diagnosis Date  Acute asthma 07/27/2012   Breast cancer ([place]) 12/28/14   Left BresastDCIS   COLONIC POLYPS, HX OF 11/04/2007   DVT (deep venous thrombosis) ([place]) 2018   left leg   GERD 11/04/2007   GERD (gastroesophageal reflux disease)    HAIR LOSS 11/04/2007   Hearing loss    bilateral - wears  hearing aids   Hx of cardiac cath 11/15/2016   a. LHC 11/15/16 showed normal coronaries and EF 50-55% with normal EDP. (done for false positive NST)   Hx of colonic polyps 1995   adenomatous   Hyperlipidemia    HYPERLIPIDEMIA 11/04/2007   Hypertension    HYPERTENSION 06/07/2009   LBBB (left bundle branch block) 07/26/2011   no current problems   Personal history of radiation therapy    S/P radiation therapy 02/02/15-03/24/15   left breast 60.4GY    PAST SURGICAL HISTORY: Past Surgical History:  Procedure Laterality Date   ABDOMINAL HYSTERECTOMY     partial   ANTERIOR FUSION CERVICAL SPINE     bilat ear surgury/hearing loss     wears hearing aids   BREAST LUMPECTOMY Left 2016   BREAST LUMPECTOMY WITH RADIOACTIVE SEED LOCALIZATION Left 12/28/2014   Procedure: BREAST LUMPECTOMY WITH RADIOACTIVE SEED LOCALIZATION;  Surgeon: Autumn Messing III, Wall;  Location: [facility];  Service: General;  Laterality: Left;   COLONOSCOPY     LEFT HEART CATH AND CORONARY ANGIOGRAPHY N/A 11/15/2016   Procedure: LEFT HEART CATH AND CORONARY ANGIOGRAPHY;  Surgeon: Belva Crome, Wall;  Location: [facility];  Service: Cardiovascular;  Laterality: N/A;   POLYPECTOMY     UPPER GASTROINTESTINAL ENDOSCOPY     VULVECTOMY PARTIAL N/A 02/02/2019   Procedure: VULVECTOMY PARTIAL;  Surgeon: Newton Pigg, Wall;  Location: [facility];  Service: Gynecology;  Laterality: N/A;   WISDOM TOOTH EXTRACTION      FAMILY HISTORY: Family History  Problem Relation Age of Onset   Breast cancer Other    Cancer Paternal Grandmother        breast   Lung cancer Father    Breast cancer Maternal Grandmother    Healthy Mother    Coronary artery disease Neg Hx    Colon cancer Neg Hx    Pancreatic cancer Neg Hx    Rectal cancer Neg Hx    Stomach cancer Neg Hx    Esophageal cancer Neg Hx    [name] died from lung cancer in the setting of tobacco abuse at  age 21. [name] died from "old age" at age 40. The patient had one brother, 3 sisters. The brother had" I cancer" requiring enucleation--the patient does not know whether this was a primary ocular melanoma. [name] was diagnosed with breast cancer but the patient does not know at what age. There is no other history of breast or ovarian cancer in the family   GYNECOLOGIC HISTORY:  No LMP recorded (lmp unknown). Patient has had a hysterectomy. Menarche age 31, first live birth age 5. The patient is GX P3. She is status post hysterectomy without salpingo-oophorectomy. She did not take hormone replacement. She did use oral contraceptives remotely without any complications.   SOCIAL HISTORY:  Caitlin Wall works in accounts payable.  She is currently applying for disability.  Her husband [name] is disabled because of back problems. [name] is a Marine scientist working in rehabilitation in [place]. [name] lives in [place] where he works as a Games developer. [name] lives in  [place] where he works as a Emergency planning/management officer for hospice. The patient has 4 grandchildren. She is not a Ambulance person.    ADVANCED DIRECTIVES: Not in place   HEALTH MAINTENANCE: Social History   Tobacco Use   Smoking status: Former Smoker    Packs/day: 1.00    Years: 20.00    Pack years: 20.00    Types: Cigarettes    Quit date: 08/19/1989    Years since quitting: 29.8   Smokeless tobacco: Never Used  Substance Use Topics   Alcohol use: Yes    Comment: rare   Drug use: No    Colonoscopy: 10/2018, repeat 2027 (Dr. Fuller Plan)  PAP: s/p hysterectomy  Bone density: 02/2018, -1.7  Allergies  Allergen Reactions   Codeine     REACTION: nausea,   Tramadol Hcl Nausea Only    Current Outpatient Medications  Medication Sig Dispense Refill   aspirin EC 81 MG tablet Take 81 mg by mouth daily.     budesonide-formoterol (SYMBICORT) 160-4.5 MCG/ACT inhaler Inhale 2 puffs  into the lungs 2 (two) times daily. 1 Inhaler 3   capecitabine (XELODA) 500 MG tablet Take 3 tablets (1,500 mg total) by mouth 2 (two) times daily after a meal. Take for 14 days, then do not take for 7 days, then repeat. (Patient not taking: Reported on 05/13/2019) 84 tablet 4   cholecalciferol (VITAMIN D3) 25 MCG (1000 UT) tablet Take 1 tablet (1,000 Units total) by mouth daily.     dexamethasone (DECADRON) 2 MG tablet Take 2 tablets (4 mg total) by mouth in the morning. 60 tablet 1   DULoxetine (CYMBALTA) 30 MG capsule Take 1 capsule (30 mg total) by mouth daily. (Patient not taking: Reported on 05/13/2019) 30 capsule 0   fluconazole (DIFLUCAN) 100 MG tablet Take 1 tablet (100 mg total) by mouth daily. 10 tablet 0   loratadine (CLARITIN) 10 MG tablet Take 1 tablet (10 mg total) by mouth daily. 90 tablet 4   magnesium citrate SOLN Take 1 Bottle by mouth once. Take once today     morphine (MS CONTIN) 15 MG 12 hr tablet Take 1 tablet (15 mg total) by mouth every 8 (eight) hours. 90 tablet 0   morphine 10 MG/5ML solution Take 2.5-5 mLs (5-10 mg total) by mouth every 4 (four) hours as needed for severe pain. 100 mL 0   ondansetron (ZOFRAN) 4 MG tablet Take 1 tablet (4 mg total) by mouth 3 (three) times daily before meals. 60 tablet 4   polyethylene glycol (MIRALAX / GLYCOLAX) 17 g packet Take 17 g by mouth daily. To prevent constipation from pain medication     promethazine (PHENERGAN) 25 MG suppository Place 1 suppository (25 mg total) rectally every 6 (six) hours as needed for nausea or vomiting. 12 each 0   senna-docusate (SENOKOT-S) 8.6-50 MG tablet Take 2 tablets by mouth 2 (two) times daily. To prevent constipation from pain medication     No current facility-administered medications for this visit.    OBJECTIVE: white woman examined in a wheelchair  Vitals:   06/01/19 1451  BP: 102/78  Pulse: 65  Resp: 20  Temp: 98.7 F (37.1 C)  SpO2: 100%     Body mass index is 25.66  kg/m.    ECOG FS:1 - Symptomatic but completely ambulatory  Filed Weights   06/01/19 1451  Weight: 154 lb 3.2 oz (69.9 kg)     Sclerae do not appear icteric, EOMs intact Wearing a mask No cervical  or supraclavicular adenopathy Lungs no rales or rhonchi Heart regular rate and rhythm Abd soft, nontender, positive bowel sounds MSK no focal spinal tenderness, no upper extremity lymphedema Neuro: nonfocal, well oriented, appropriate affect Breasts: Deferred   LAB RESULTS:  CMP     Component Value Date/Time   NA 138 05/31/2019 0855   NA 141 04/25/2017 1213   NA 145 07/04/2016 1133   K 4.9 05/31/2019 0855   K 4.2 07/04/2016 1133   CL 103 05/31/2019 0855   CO2 18 (L) 05/31/2019 0855   CO2 28 07/04/2016 1133   GLUCOSE 100 (H) 05/31/2019 0855   GLUCOSE 101 07/04/2016 1133   BUN 32 (H) 05/31/2019 0855   BUN 18 04/25/2017 1213   BUN 16.0 07/04/2016 1133   CREATININE 1.05 (H) 05/31/2019 0855   CREATININE 1.09 (H) 07/23/2018 1339   CREATININE 0.9 07/04/2016 1133   CALCIUM 8.6 (L) 05/31/2019 0855   CALCIUM 9.9 07/04/2016 1133   PROT 6.3 (L) 05/31/2019 0855   PROT 7.3 07/04/2016 1133   ALBUMIN 2.1 (L) 05/31/2019 0855   ALBUMIN 3.8 07/04/2016 1133   AST 520 (HH) 05/31/2019 0855   AST 35 07/23/2018 1339   AST 19 07/04/2016 1133   ALT 144 (H) 05/31/2019 0855   ALT 25 07/23/2018 1339   ALT 17 07/04/2016 1133   ALKPHOS 679 (H) 05/31/2019 0855   ALKPHOS 172 (H) 07/04/2016 1133   BILITOT 3.1 (H) 05/31/2019 0855   BILITOT 1.1 07/23/2018 1339   BILITOT 0.94 07/04/2016 1133   GFRNONAA 57 (L) 05/31/2019 0855   GFRNONAA 55 (L) 07/23/2018 1339   GFRAA >60 05/31/2019 0855   GFRAA >60 07/23/2018 1339    INo results found for: SPEP, UPEP  Lab Results  Component Value Date   WBC 10.1 05/31/2019   NEUTROABS 8.1 (H) 05/31/2019   HGB 13.5 05/31/2019   HCT 42.1 05/31/2019   MCV 101.4 (H) 05/31/2019   PLT 94 (L) 05/31/2019      Chemistry      Component Value Date/Time   NA 138  05/31/2019 0855   NA 141 04/25/2017 1213   NA 145 07/04/2016 1133   K 4.9 05/31/2019 0855   K 4.2 07/04/2016 1133   CL 103 05/31/2019 0855   CO2 18 (L) 05/31/2019 0855   CO2 28 07/04/2016 1133   BUN 32 (H) 05/31/2019 0855   BUN 18 04/25/2017 1213   BUN 16.0 07/04/2016 1133   CREATININE 1.05 (H) 05/31/2019 0855   CREATININE 1.09 (H) 07/23/2018 1339   CREATININE 0.9 07/04/2016 1133      Component Value Date/Time   CALCIUM 8.6 (L) 05/31/2019 0855   CALCIUM 9.9 07/04/2016 1133   ALKPHOS 679 (H) 05/31/2019 0855   ALKPHOS 172 (H) 07/04/2016 1133   AST 520 (HH) 05/31/2019 0855   AST 35 07/23/2018 1339   AST 19 07/04/2016 1133   ALT 144 (H) 05/31/2019 0855   ALT 25 07/23/2018 1339   ALT 17 07/04/2016 1133   BILITOT 3.1 (H) 05/31/2019 0855   BILITOT 1.1 07/23/2018 1339   BILITOT 0.94 07/04/2016 1133     No results found for: LABCA2  No components found for: LABCA125  No results for input(s): INR in the last 168 hours.  Urinalysis    Component Value Date/Time   COLORURINE YELLOW 07/20/2018 [place] 07/20/2018 1747   LABSPEC 1.019 07/20/2018 1747   PHURINE 5.0 07/20/2018 1747   GLUCOSEU NEGATIVE 07/20/2018 1747   GLUCOSEU NEGATIVE 10/20/2017 1303  HGBUR NEGATIVE 07/20/2018 1747   BILIRUBINUR NEGATIVE 07/20/2018 1747   KETONESUR 20 (A) 07/20/2018 1747   PROTEINUR NEGATIVE 07/20/2018 1747   UROBILINOGEN 1.0 10/20/2017 1303   NITRITE NEGATIVE 07/20/2018 1747   LEUKOCYTESUR NEGATIVE 07/20/2018 1747    STUDIES: US BIOPSY (LIVER)  Result Date: 05/18/2019 INDICATION: 64 year old female with breast cancer and multifocal hepatic lesions concerning for metastatic disease. She presents for ultrasound-guided core biopsy of the same. EXAM: ULTRASOUND BIOPSY CORE LIVER MEDICATIONS: None. ANESTHESIA/SEDATION: Moderate (conscious) sedation was employed during this procedure. A total of Versed 2 mg and Fentanyl 100 mcg was administered intravenously. Moderate Sedation  Time: 14 minutes. The patient's level of consciousness and vital signs were monitored continuously by radiology nursing throughout the procedure under my direct supervision. FLUOROSCOPY TIME:  None. COMPLICATIONS: None immediate. PROCEDURE: Informed written consent was obtained from the patient after a thorough discussion of the procedural risks, benefits and alternatives. All questions were addressed. Maximal Sterile Barrier Technique was utilized including caps, mask, sterile gowns, sterile gloves, sterile drape, hand hygiene and skin antiseptic. A timeout was performed prior to the initiation of the procedure. Ultrasound was used to interrogate the liver. Multiple hypoechoic solid liver lesions are identified. A suitable lesion in hepatic segment 5 was selected. A skin entry site was marked. The overlying skin was sterilely prepped and draped in standard fashion using chlorhexidine skin prep. Local anesthesia was attained by infiltration with 1% lidocaine. A small dermatotomy was made. Under real-time ultrasound guidance, a 17 gauge introducer needle was advanced through the liver and positioned at the margin of the mass. Multiple 18 gauge core biopsies were then coaxially obtained using the bio Pince automated biopsy device. Biopsy specimens were placed in formalin and delivered to pathology for further analysis. As the introducer needle was removed, the biopsy tract was embolized with a Gel-Foam slurry. Post biopsy ultrasound imaging demonstrates no evidence of immediate complication. IMPRESSION: Technically successful ultrasound-guided core biopsy of liver lesion. Signed, Criselda Peaches, Wall, [facility] Radiology Electronically Signed   By: Jacqulynn Cadet M.D.   On: 05/18/2019 18:35   MR TOTAL SPINE METS SCREENING  Result Date: 05/12/2019 CLINICAL DATA:  Widely metastatic breast cancer. Burning and numbness in the legs. Evaluate for cord  compression. EXAM: MRI TOTAL SPINE WITHOUT AND WITH CONTRAST TECHNIQUE: Multisequence MR imaging of the spine from the cervical spine to the sacrum was performed prior to and following IV contrast administration for evaluation of spinal metastatic disease. CONTRAST:  2m GADAVIST GADOBUTROL 1 MMOL/ML IV SOLN COMPARISON:  Nuclear medicine bone scan 04/30/2019 FINDINGS: MRI CERVICAL SPINE FINDINGS Alignment: Normal Vertebrae: Osseous metastatic disease throughout the cervical region. Fusion at the C5-6 level, probably congenital. No evidence of lytic destructive bone lesion or extraosseous extension tumor. Cord: No evidence of cord metastasis. Posterior Fossa, vertebral arteries, paraspinal tissues: No significant regional soft tissue lesion seen. Disc levels: Mild non-compressive degenerative changes at C1-2, C2-3 and C3-4. At C4-5, there is degenerative spondylosis with endplate osteophytes and bulging of the disc. AP diameter of the canal is only 6.5 mm. The subarachnoid space is effaced in the cord is indented slightly. There is bilateral foraminal narrowing. No stenosis at the C5-6 fusion level. Mild non-compressive spondylosis at C6-7 and C7-T1. MRI THORACIC SPINE FINDINGS Alignment:  Normal Vertebrae: Widespread osseous metastatic disease. Previous radiation change of the T6, T7, T8 and T9 vertebrae with fatty change. Small metastases are present within the fatty areas. The other non radiated thoracic  vertebral bodies show more extensive and confluent intra osseous tumor. There is no evidence of pathologic fracture. There is no visible extraosseous tumor extension. Cord: No evidence of primary cord or leptomeningeal metastatic disease. Paraspinal and other soft tissues: No significant finding. Disc levels: No significant thoracic disc disease. Mild non-compressive disc bulge at T11-12. MRI LUMBAR SPINE FINDINGS Segmentation:  5 lumbar type vertebral bodies. Alignment:  3 mm degenerative retrolisthesis L1-2.  Vertebrae: Widespread osseous metastatic disease throughout the lumbar spine and the sacrum. Disease is more pronounced in the sacrum, particularly on the right. In the lumbar region, there is no fracture or extraosseous tumor extension. In the sacrum, there is probably some early tumor extension in the right S1 foramen that could affect that nerve. Conus medullaris: Extends to the L1 level and appears normal. Paraspinal and other soft tissues: Negative.  Not studied in detail. Disc levels: No significant disc pathology. No compressive stenosis of the canal. Foramina sufficiently patent. IMPRESSION: Widespread osseous metastatic disease throughout the spine. Fatty change of the marrow from T6 through T9 consistent with previous radiation. No evidence of extraosseous tumor extension, pathologic fracture or primary neural metastasis in the cervical, thoracic or lumbar region. More extensive metastatic disease within the sacrum, particularly on the right. I think there is early tumor extension into the foramen on the right at the S1 foramen, which could explain right S1 symptomatology. Electronically Signed   By: Nelson Chimes M.D.   On: 05/12/2019 15:35     ASSESSMENT: 64 y.o. [place] woman status post left lumpectomy 12/28/2014 for ductal carcinoma in situ, high-grade measuring 0.2 cm, estrogen and progesterone receptor positive, with close but negative margins  (1) adjuvant radiation 02/02/2015 through 03/24/2015: The patient initially received a dose of 50.4 Gy in 28 fractions to the breast using whole-breast tangent fields. This was delivered using a 3-D conformal technique. The patient then received a boost to the seroma. This delivered an additional 10 Gy in 5 fractions using a en face electron field. The total dose was 60.4 Gy.   (2) anastrozole started 04/19/2015   (a) bone density 03/01/2016 shows a T score of -1.2  (3) mildly abnormal hepatic function panel: negative workup for hepatitis B  and C, normal alpha-1-anti-trypsin  (4) left lower extremity DVT diagnosed by Doppler ultrasonography 04/16/2016, with negative CT angiogram chest  (a) on Xarelto starting 04/16/2016   (b) negative hypercoagulable panel, with a minimally abnormal (indeterminate) anti-cardiolipin IgM  ((c) stopped rivaroxaban 07/16/2016, repeat D-Dimer WNL August 2018  METASTATIC DISEASE: December 2019: bone only (5) CT chest 02/06/2018 shows a mixed lytic and sclerotic process with fracture at the inferior manubrium but no other bone lesions.  However bone scan 02/16/2018 shows multiple bone lesions consistent with metastatic disease; no lung or pleura involvement, no adenopathy  (a) Bone marrow biopsy on 03/03/2018 shows metastatic carcinoma consistent with breast primary, estrogen and progesterone receptor strongly positive, HER-2 not amplified (1+)  (b) MRI abdomen and MRI brain at [facility] 03/17/2018 (results viewable in "care everywhere") notes known bone involvement in spine and pelvis, but no other area of metastatic disease in abdomen/pelvis, or brain.  (c) CA 27.29 not informative (37.9 on 03/03/2018)  (6) started denosumab/Xgeva on 03/18/2018, repeated Q28 d  (7) started fulvestrant on 03/03/2018 and palbociclib 03/18/2018 at 125 mg/d 21/7  (a) bone scan 06/10/2018 serves as our new baseline  (b) chest CT scan on 11/11/2018 shows multiple sclerotic bone lesions (treatment effect) no visceral lesions; lungs also show  radiation change  (c) CT angio 11/16/2018 shows no evidence of pulmonary embolism  (d) V/Q scan 04/23/2019 read as low probability  (e) fulvestrant and palbiciclib discontinued with disease progression (bone and liver)  (8) palliative radiation 07/01/2018-07/14/2018:  The patient received 30 Gy in 10 fractions to bilateral ribs and lower thoracic spine  (a) esophagitis secondary to radiation has resolved  (9) pain management/bowel prophylaxis  (a) currently on MS Contin 15 mg Qhs with  morphine liquid 10 mg and 5 cc Q6h as needed  (b) bowel prophylaxis  (10) palliative radiation to lumbar area completed 06/01/2019  (11) starting capecitabine 06/02/2019  (a) chest CT scan shows new liver lesions, bone scan shows new bone lesions 04/30/2019  (12) liver biopsy 05/18/2019 confirms metastatic breast cancer to the liver  (a) CARIS molecular studies requested 05/20/2019   PLAN: Marsa has completed her radiation treatments.  It is not obvious whether this has helped her pain or not.  It is very difficult to get a clear idea of what she is taking in terms of pain medication as she takes the pain medicines very variably or possibly not at all.  I have encouraged her to have her husband come in with her and have assured her that it is now allowed.    I have written for Diflucan for her thrush and explained how to use it.  I gave her Zofran to take orally and Phenergan to take rectally as needed for nausea.  We discussed starting capecitabine.  We again reviewed the possible toxicities side effects and complications.  She is going to take the usual 1500 mg twice daily for the next week and I am going to see her a week from now just to make sure that the liver function tests are not worse and that she is tolerating it adequately.  Otherwise we will go with that 1 week on 1 week off protocol instead of the usual 2 weeks on 1 week off.  Total encounter time 30 minutes.   Virgie Dad. Dabney Dever, Wall 06/01/19 5:22 PM Medical Oncology and Hematology [facility] [place], [place] [address] Tel. [phone]    Fax. [phone]   I, Wilburn Mylar, am acting as scribe for Dr. Virgie Dad. Kacyn Souder.  I, Lurline Del Wall, have reviewed the above documentation for accuracy and completeness, and I agree with the above.   *Total Encounter Time as defined by the Centers for Medicare and Medicaid Services includes, in addition to the face-to-face time of a patient  visit (documented in the note above) non-face-to-face time: obtaining and reviewing outside history, ordering and reviewing medications, tests or procedures, care coordination (communications with other health care professionals or caregivers) and documentation in the medical record.

## 2019-05-31 ENCOUNTER — Inpatient Hospital Stay: Payer: BC Managed Care – PPO

## 2019-05-31 ENCOUNTER — Inpatient Hospital Stay: Payer: BC Managed Care – PPO | Attending: Oncology

## 2019-05-31 ENCOUNTER — Ambulatory Visit: Payer: 59

## 2019-05-31 ENCOUNTER — Other Ambulatory Visit: Payer: 59

## 2019-05-31 ENCOUNTER — Other Ambulatory Visit: Payer: Self-pay

## 2019-05-31 ENCOUNTER — Other Ambulatory Visit: Payer: Self-pay | Admitting: Oncology

## 2019-05-31 ENCOUNTER — Ambulatory Visit: Payer: BC Managed Care – PPO

## 2019-05-31 ENCOUNTER — Ambulatory Visit
Admission: RE | Admit: 2019-05-31 | Discharge: 2019-05-31 | Disposition: A | Discharge status: Home / Self Care | Payer: BC Managed Care – PPO | Source: Ambulatory Visit | Attending: Radiation Oncology | Admitting: Radiation Oncology

## 2019-05-31 VITALS — BP 105/69 | HR 129 | Temp 98.4°F | Resp 22

## 2019-05-31 DIAGNOSIS — C50512 Malignant neoplasm of lower-outer quadrant of left female breast: Secondary | ICD-10-CM

## 2019-05-31 DIAGNOSIS — Z885 Allergy status to narcotic agent status: Secondary | ICD-10-CM | POA: Insufficient documentation

## 2019-05-31 DIAGNOSIS — K2100 Gastro-esophageal reflux disease with esophagitis, without bleeding: Secondary | ICD-10-CM | POA: Insufficient documentation

## 2019-05-31 DIAGNOSIS — Z803 Family history of malignant neoplasm of breast: Secondary | ICD-10-CM | POA: Diagnosis not present

## 2019-05-31 DIAGNOSIS — K769 Liver disease, unspecified: Secondary | ICD-10-CM | POA: Insufficient documentation

## 2019-05-31 DIAGNOSIS — K5900 Constipation, unspecified: Secondary | ICD-10-CM | POA: Insufficient documentation

## 2019-05-31 DIAGNOSIS — E785 Hyperlipidemia, unspecified: Secondary | ICD-10-CM | POA: Diagnosis not present

## 2019-05-31 DIAGNOSIS — Z87891 Personal history of nicotine dependence: Secondary | ICD-10-CM | POA: Diagnosis not present

## 2019-05-31 DIAGNOSIS — M50221 Other cervical disc displacement at C4-C5 level: Secondary | ICD-10-CM | POA: Diagnosis not present

## 2019-05-31 DIAGNOSIS — R200 Anesthesia of skin: Secondary | ICD-10-CM | POA: Insufficient documentation

## 2019-05-31 DIAGNOSIS — Z801 Family history of malignant neoplasm of trachea, bronchus and lung: Secondary | ICD-10-CM | POA: Diagnosis not present

## 2019-05-31 DIAGNOSIS — C7951 Secondary malignant neoplasm of bone: Secondary | ICD-10-CM | POA: Insufficient documentation

## 2019-05-31 DIAGNOSIS — Z7901 Long term (current) use of anticoagulants: Secondary | ICD-10-CM | POA: Insufficient documentation

## 2019-05-31 DIAGNOSIS — Z923 Personal history of irradiation: Secondary | ICD-10-CM | POA: Diagnosis not present

## 2019-05-31 DIAGNOSIS — Z8719 Personal history of other diseases of the digestive system: Secondary | ICD-10-CM | POA: Diagnosis not present

## 2019-05-31 DIAGNOSIS — Z170 Estrogen receptor positive status [ER+]: Secondary | ICD-10-CM | POA: Insufficient documentation

## 2019-05-31 DIAGNOSIS — Z79899 Other long term (current) drug therapy: Secondary | ICD-10-CM | POA: Diagnosis not present

## 2019-05-31 DIAGNOSIS — M5136 Other intervertebral disc degeneration, lumbar region: Secondary | ICD-10-CM | POA: Insufficient documentation

## 2019-05-31 DIAGNOSIS — Z86718 Personal history of other venous thrombosis and embolism: Secondary | ICD-10-CM | POA: Insufficient documentation

## 2019-05-31 DIAGNOSIS — Z7952 Long term (current) use of systemic steroids: Secondary | ICD-10-CM | POA: Insufficient documentation

## 2019-05-31 DIAGNOSIS — Z90710 Acquired absence of both cervix and uterus: Secondary | ICD-10-CM | POA: Diagnosis not present

## 2019-05-31 DIAGNOSIS — Z510 Encounter for antineoplastic radiation therapy: Secondary | ICD-10-CM | POA: Diagnosis not present

## 2019-05-31 DIAGNOSIS — I10 Essential (primary) hypertension: Secondary | ICD-10-CM | POA: Insufficient documentation

## 2019-05-31 DIAGNOSIS — I824Z2 Acute embolism and thrombosis of unspecified deep veins of left distal lower extremity: Secondary | ICD-10-CM

## 2019-05-31 DIAGNOSIS — C787 Secondary malignant neoplasm of liver and intrahepatic bile duct: Secondary | ICD-10-CM

## 2019-05-31 LAB — CBC WITH DIFFERENTIAL/PLATELET
Abs Immature Granulocytes: 0.67 10*3/uL — ABNORMAL HIGH (ref 0.00–0.07)
Basophils Absolute: 0.1 10*3/uL (ref 0.0–0.1)
Basophils Relative: 1 %
Eosinophils Absolute: 0 10*3/uL (ref 0.0–0.5)
Eosinophils Relative: 0 %
HCT: 42.1 % (ref 36.0–46.0)
Hemoglobin: 13.5 g/dL (ref 12.0–15.0)
Immature Granulocytes: 7 %
Lymphocytes Relative: 3 %
Lymphs Abs: 0.3 10*3/uL — ABNORMAL LOW (ref 0.7–4.0)
MCH: 32.5 pg (ref 26.0–34.0)
MCHC: 32.1 g/dL (ref 30.0–36.0)
MCV: 101.4 fL — ABNORMAL HIGH (ref 80.0–100.0)
Monocytes Absolute: 0.9 10*3/uL (ref 0.1–1.0)
Monocytes Relative: 9 %
Neutro Abs: 8.1 10*3/uL — ABNORMAL HIGH (ref 1.7–7.7)
Neutrophils Relative %: 80 %
Platelets: 94 10*3/uL — ABNORMAL LOW (ref 150–400)
RBC: 4.15 MIL/uL (ref 3.87–5.11)
RDW: 17.4 % — ABNORMAL HIGH (ref 11.5–15.5)
WBC: 10.1 10*3/uL (ref 4.0–10.5)
nRBC: 2.7 % — ABNORMAL HIGH (ref 0.0–0.2)

## 2019-05-31 LAB — COMPREHENSIVE METABOLIC PANEL
ALT: 144 U/L — ABNORMAL HIGH (ref 0–44)
AST: 520 U/L (ref 15–41)
Albumin: 2.1 g/dL — ABNORMAL LOW (ref 3.5–5.0)
Alkaline Phosphatase: 679 U/L — ABNORMAL HIGH (ref 38–126)
Anion gap: 17 — ABNORMAL HIGH (ref 5–15)
BUN: 32 mg/dL — ABNORMAL HIGH (ref 8–23)
CO2: 18 mmol/L — ABNORMAL LOW (ref 22–32)
Calcium: 8.6 mg/dL — ABNORMAL LOW (ref 8.9–10.3)
Chloride: 103 mmol/L (ref 98–111)
Creatinine, Ser: 1.05 mg/dL — ABNORMAL HIGH (ref 0.44–1.00)
GFR calc Af Amer: >60 mL/min (ref >60)
GFR calc non Af Amer: 57 mL/min — ABNORMAL LOW (ref >60)
Glucose, Bld: 100 mg/dL — ABNORMAL HIGH (ref 70–99)
Potassium: 4.9 mmol/L (ref 3.5–5.1)
Sodium: 138 mmol/L (ref 135–145)
Total Bilirubin: 3.1 mg/dL — ABNORMAL HIGH (ref 0.3–1.2)
Total Protein: 6.3 g/dL — ABNORMAL LOW (ref 6.5–8.1)

## 2019-05-31 MED ORDER — ONDANSETRON HCL 4 MG/2ML IJ SOLN
8.0000 mg | Freq: Once | INTRAMUSCULAR | Status: DC
  Administered 2019-05-31: 8 mg via INTRAVENOUS

## 2019-05-31 MED ORDER — SODIUM CHLORIDE 0.9 % IV SOLN
INTRAVENOUS | Status: DC
  Filled 2019-05-31: qty 250

## 2019-05-31 MED ORDER — DENOSUMAB 120 MG/1.7ML ~~LOC~~ SOLN
SUBCUTANEOUS | Status: AC
  Filled 2019-05-31: qty 1.7

## 2019-05-31 MED ORDER — ONDANSETRON HCL 4 MG/2ML IJ SOLN
INTRAMUSCULAR | Status: AC
  Filled 2019-05-31: qty 4

## 2019-05-31 MED ORDER — FULVESTRANT 250 MG/5ML IM SOLN
INTRAMUSCULAR | Status: AC
  Filled 2019-05-31: qty 10

## 2019-05-31 MED ORDER — SODIUM CHLORIDE 0.9% FLUSH
10.0000 mL | INTRAVENOUS | Status: DC | PRN
  Filled 2019-05-31: qty 10

## 2019-05-31 NOTE — Progress Notes (Signed)
Per MD patient will not receive Xgeva today due to calcium. She also complained of SOB and N/V x 1 day. MD seen her and ordered fluids and zofran.

## 2019-05-31 NOTE — Patient Instructions (Signed)
Denosumab injection What is this medicine? DENOSUMAB (den oh sue mab) slows bone breakdown. Prolia is used to treat osteoporosis in women after menopause and in men, and in people who are taking corticosteroids for 6 months or more. Xgeva is used to treat a high calcium level due to cancer and to prevent bone fractures and other bone problems caused by multiple myeloma or cancer bone metastases. Xgeva is also used to treat giant cell tumor of the bone. This medicine may be used for other purposes; ask your health care provider or pharmacist if you have questions. COMMON BRAND NAME(S): Prolia, XGEVA What should I tell my health care provider before I take this medicine? They need to know if you have any of these conditions:  dental disease  having surgery or tooth extraction  infection  kidney disease  low levels of calcium or Vitamin D in the blood  malnutrition  on hemodialysis  skin conditions or sensitivity  thyroid or parathyroid disease  an unusual reaction to denosumab, other medicines, foods, dyes, or preservatives  pregnant or trying to get pregnant  breast-feeding How should I use this medicine? This medicine is for injection under the skin. It is given by a health care professional in a hospital or clinic setting. A special MedGuide will be given to you before each treatment. Be sure to read this information carefully each time. For Prolia, talk to your pediatrician regarding the use of this medicine in children. Special care may be needed. For Xgeva, talk to your pediatrician regarding the use of this medicine in children. While this drug may be prescribed for children as young as 13 years for selected conditions, precautions do apply. Overdosage: If you think you have taken too much of this medicine contact a poison control center or emergency room at once. NOTE: This medicine is only for you. Do not share this medicine with others. What if I miss a dose? It is  important not to miss your dose. Call your doctor or health care professional if you are unable to keep an appointment. What may interact with this medicine? Do not take this medicine with any of the following medications:  other medicines containing denosumab This medicine may also interact with the following medications:  medicines that lower your chance of fighting infection  steroid medicines like prednisone or cortisone This list may not describe all possible interactions. Give your health care provider a list of all the medicines, herbs, non-prescription drugs, or dietary supplements you use. Also tell them if you smoke, drink alcohol, or use illegal drugs. Some items may interact with your medicine. What should I watch for while using this medicine? Visit your doctor or health care professional for regular checks on your progress. Your doctor or health care professional may order blood tests and other tests to see how you are doing. Call your doctor or health care professional for advice if you get a fever, chills or sore throat, or other symptoms of a cold or flu. Do not treat yourself. This drug may decrease your body's ability to fight infection. Try to avoid being around people who are sick. You should make sure you get enough calcium and vitamin D while you are taking this medicine, unless your doctor tells you not to. Discuss the foods you eat and the vitamins you take with your health care professional. See your dentist regularly. Brush and floss your teeth as directed. Before you have any dental work done, tell your dentist you are   receiving this medicine. Do not become pregnant while taking this medicine or for 5 months after stopping it. Talk with your doctor or health care professional about your birth control options while taking this medicine. Women should inform their doctor if they wish to become pregnant or think they might be pregnant. There is a potential for serious side  effects to an unborn child. Talk to your health care professional or pharmacist for more information. What side effects may I notice from receiving this medicine? Side effects that you should report to your doctor or health care professional as soon as possible:  allergic reactions like skin rash, itching or hives, swelling of the face, lips, or tongue  bone pain  breathing problems  dizziness  jaw pain, especially after dental work  redness, blistering, peeling of the skin  signs and symptoms of infection like fever or chills; cough; sore throat; pain or trouble passing urine  signs of low calcium like fast heartbeat, muscle cramps or muscle pain; pain, tingling, numbness in the hands or feet; seizures  unusual bleeding or bruising  unusually weak or tired Side effects that usually do not require medical attention (report to your doctor or health care professional if they continue or are bothersome):  constipation  diarrhea  headache  joint pain  loss of appetite  muscle pain  runny nose  tiredness  upset stomach This list may not describe all possible side effects. Call your doctor for medical advice about side effects. You may report side effects to FDA at 1-800-FDA-1088. Where should I keep my medicine? This medicine is only given in a clinic, doctor's office, or other health care setting and will not be stored at home. NOTE: This sheet is a summary. It may not cover all possible information. If you have questions about this medicine, talk to your doctor, pharmacist, or health care provider.  2020 [employer] Standard (2017-06-20 16:10:44) Fulvestrant injection What is this medicine? FULVESTRANT (ful VES trant) blocks the effects of estrogen. It is used to treat breast cancer. This medicine may be used for other purposes; ask your health care provider or pharmacist if you have questions. COMMON BRAND NAME(S): FASLODEX What should I tell my health care  provider before I take this medicine? They need to know if you have any of these conditions:  bleeding disorders  liver disease  low blood counts, like low white cell, platelet, or red cell counts  an unusual or allergic reaction to fulvestrant, other medicines, foods, dyes, or preservatives  pregnant or trying to get pregnant  breast-feeding How should I use this medicine? This medicine is for injection into a muscle. It is usually given by a health care professional in a hospital or clinic setting. Talk to your pediatrician regarding the use of this medicine in children. Special care may be needed. Overdosage: If you think you have taken too much of this medicine contact a poison control center or emergency room at once. NOTE: This medicine is only for you. Do not share this medicine with others. What if I miss a dose? It is important not to miss your dose. Call your doctor or health care professional if you are unable to keep an appointment. What may interact with this medicine?  medicines that treat or prevent blood clots like warfarin, enoxaparin, dalteparin, apixaban, dabigatran, and rivaroxaban This list may not describe all possible interactions. Give your health care provider a list of all the medicines, herbs, non-prescription drugs, or dietary supplements you use.   Also tell them if you smoke, drink alcohol, or use illegal drugs. Some items may interact with your medicine. What should I watch for while using this medicine? Your condition will be monitored carefully while you are receiving this medicine. You will need important blood work done while you are taking this medicine. Do not become pregnant while taking this medicine or for at least 1 year after stopping it. Women of child-bearing potential will need to have a negative pregnancy test before starting this medicine. Women should inform their doctor if they wish to become pregnant or think they might be pregnant. There is  a potential for serious side effects to an unborn child. Men should inform their doctors if they wish to father a child. This medicine may lower sperm counts. Talk to your health care professional or pharmacist for more information. Do not breast-feed an infant while taking this medicine or for 1 year after the last dose. What side effects may I notice from receiving this medicine? Side effects that you should report to your doctor or health care professional as soon as possible:  allergic reactions like skin rash, itching or hives, swelling of the face, lips, or tongue  feeling faint or lightheaded, falls  pain, tingling, numbness, or weakness in the legs  signs and symptoms of infection like fever or chills; cough; flu-like symptoms; sore throat  vaginal bleeding Side effects that usually do not require medical attention (report to your doctor or health care professional if they continue or are bothersome):  aches, pains  constipation  diarrhea  headache  hot flashes  nausea, vomiting  pain at site where injected  stomach pain This list may not describe all possible side effects. Call your doctor for medical advice about side effects. You may report side effects to FDA at 1-800-FDA-1088. Where should I keep my medicine? This drug is given in a hospital or clinic and will not be stored at home. NOTE: This sheet is a summary. It may not cover all possible information. If you have questions about this medicine, talk to your doctor, pharmacist, or health care provider.  2020 [employer] Standard (2017-05-22 11:34:41)  

## 2019-05-31 NOTE — Progress Notes (Signed)
Pt received IVF & IV zofran today per MD Magrinat orders who saw pt during injection appt today, will return tomorrow for f/u visit with MD.  Tolerated well.  Escorted via w/c to radiation dept w/belongings after infusion.  VSS.  Pt denied any N/V at end of tx, able to eat & drink some during tx w/out any issues.

## 2019-05-31 NOTE — Patient Instructions (Signed)

## 2019-05-31 NOTE — Progress Notes (Signed)
Caitlin Wall came today for fulvestrant.  She was heaving and vomiting in the treatment area.  I went over to see her there.  She tells me yesterday she ate a lot and enjoyed being with the children.  Sitting in the wheelchair she appears very short of breath.  When I listen to her lungs though they are clear.  Her saturation is 97%.  We have previously evaluated similar symptoms with a chest CT scan on 04/30/2019 and a VQ scan 04/23/2019 neither of which suggested a primary lung issue.  I think Caitlin Wall does have significant anxiety and she may be developing associative nausea, coming to the cancer center.  She definitely does have liver involvement and this itself can cause nausea loss of appetite and altered taste.  We are going to give her a liter of fluid today with some ondansetron and she is already scheduled to see me tomorrow for full visit.

## 2019-06-01 ENCOUNTER — Inpatient Hospital Stay (HOSPITAL_BASED_OUTPATIENT_CLINIC_OR_DEPARTMENT_OTHER): Payer: BC Managed Care – PPO | Admitting: Oncology

## 2019-06-01 ENCOUNTER — Encounter: Payer: Self-pay | Admitting: Radiation Oncology

## 2019-06-01 ENCOUNTER — Other Ambulatory Visit: Payer: Self-pay

## 2019-06-01 ENCOUNTER — Inpatient Hospital Stay: Payer: BC Managed Care – PPO

## 2019-06-01 ENCOUNTER — Ambulatory Visit
Admission: RE | Admit: 2019-06-01 | Discharge: 2019-06-01 | Disposition: A | Discharge status: Home / Self Care | Payer: BC Managed Care – PPO | Source: Ambulatory Visit | Attending: Radiation Oncology | Admitting: Radiation Oncology

## 2019-06-01 DIAGNOSIS — C50512 Malignant neoplasm of lower-outer quadrant of left female breast: Secondary | ICD-10-CM | POA: Diagnosis not present

## 2019-06-01 DIAGNOSIS — C7951 Secondary malignant neoplasm of bone: Secondary | ICD-10-CM

## 2019-06-01 DIAGNOSIS — Z510 Encounter for antineoplastic radiation therapy: Secondary | ICD-10-CM | POA: Diagnosis not present

## 2019-06-01 DIAGNOSIS — Z170 Estrogen receptor positive status [ER+]: Secondary | ICD-10-CM

## 2019-06-01 DIAGNOSIS — C787 Secondary malignant neoplasm of liver and intrahepatic bile duct: Secondary | ICD-10-CM | POA: Diagnosis not present

## 2019-06-01 LAB — CANCER ANTIGEN 27.29: CA 27.29: 597 U/mL — ABNORMAL HIGH (ref 0.0–38.6)

## 2019-06-01 MED ORDER — MORPHINE SULFATE 10 MG/5ML PO SOLN: 5.0000 mg | ORAL | 0 refills | Status: AC | PRN

## 2019-06-01 MED ORDER — FLUCONAZOLE 100 MG PO TABS: 100.0000 mg | ORAL_TABLET | Freq: Every day | ORAL | 0 refills | Status: AC

## 2019-06-01 MED ORDER — PROMETHAZINE HCL 25 MG RE SUPP: 25.0000 mg | Freq: Four times a day (QID) | RECTAL | 0 refills | Status: AC | PRN

## 2019-06-01 MED ORDER — MORPHINE SULFATE ER 15 MG PO TBCR: 15.0000 mg | EXTENDED_RELEASE_TABLET | Freq: Three times a day (TID) | ORAL | 0 refills | Status: AC

## 2019-06-01 MED ORDER — ONDANSETRON HCL 4 MG PO TABS: 4.0000 mg | ORAL_TABLET | Freq: Three times a day (TID) | ORAL | 4 refills | Status: AC

## 2019-06-03 ENCOUNTER — Telehealth: Payer: Self-pay | Admitting: Oncology

## 2019-06-03 NOTE — Telephone Encounter (Signed)
Scheduled appt per 4/6 los. Called pt 2x. Pt's voicemail has not been set up.

## 2019-06-05 ENCOUNTER — Emergency Department (HOSPITAL_COMMUNITY): Payer: BC Managed Care – PPO

## 2019-06-05 ENCOUNTER — Observation Stay (HOSPITAL_COMMUNITY)
Admission: EM | Admit: 2019-06-05 | Discharge: 2019-06-26 | Disposition: E | Payer: BC Managed Care – PPO | Attending: Family Medicine | Admitting: Family Medicine

## 2019-06-05 ENCOUNTER — Other Ambulatory Visit: Payer: Self-pay

## 2019-06-05 ENCOUNTER — Encounter (HOSPITAL_COMMUNITY): Payer: Self-pay

## 2019-06-05 DIAGNOSIS — Z86718 Personal history of other venous thrombosis and embolism: Secondary | ICD-10-CM | POA: Insufficient documentation

## 2019-06-05 DIAGNOSIS — C50919 Malignant neoplasm of unspecified site of unspecified female breast: Secondary | ICD-10-CM

## 2019-06-05 DIAGNOSIS — Z7951 Long term (current) use of inhaled steroids: Secondary | ICD-10-CM | POA: Insufficient documentation

## 2019-06-05 DIAGNOSIS — J45909 Unspecified asthma, uncomplicated: Secondary | ICD-10-CM | POA: Diagnosis not present

## 2019-06-05 DIAGNOSIS — R579 Shock, unspecified: Principal | ICD-10-CM | POA: Insufficient documentation

## 2019-06-05 DIAGNOSIS — K7200 Acute and subacute hepatic failure without coma: Secondary | ICD-10-CM | POA: Diagnosis not present

## 2019-06-05 DIAGNOSIS — E785 Hyperlipidemia, unspecified: Secondary | ICD-10-CM | POA: Insufficient documentation

## 2019-06-05 DIAGNOSIS — Z66 Do not resuscitate: Secondary | ICD-10-CM | POA: Diagnosis not present

## 2019-06-05 DIAGNOSIS — N179 Acute kidney failure, unspecified: Secondary | ICD-10-CM | POA: Insufficient documentation

## 2019-06-05 DIAGNOSIS — I129 Hypertensive chronic kidney disease with stage 1 through stage 4 chronic kidney disease, or unspecified chronic kidney disease: Secondary | ICD-10-CM | POA: Insufficient documentation

## 2019-06-05 DIAGNOSIS — R748 Abnormal levels of other serum enzymes: Secondary | ICD-10-CM

## 2019-06-05 DIAGNOSIS — Z803 Family history of malignant neoplasm of breast: Secondary | ICD-10-CM | POA: Insufficient documentation

## 2019-06-05 DIAGNOSIS — E875 Hyperkalemia: Secondary | ICD-10-CM | POA: Insufficient documentation

## 2019-06-05 DIAGNOSIS — N1831 Chronic kidney disease, stage 3a: Secondary | ICD-10-CM | POA: Insufficient documentation

## 2019-06-05 DIAGNOSIS — H9193 Unspecified hearing loss, bilateral: Secondary | ICD-10-CM | POA: Insufficient documentation

## 2019-06-05 DIAGNOSIS — C50512 Malignant neoplasm of lower-outer quadrant of left female breast: Secondary | ICD-10-CM | POA: Insufficient documentation

## 2019-06-05 DIAGNOSIS — C7951 Secondary malignant neoplasm of bone: Secondary | ICD-10-CM | POA: Insufficient documentation

## 2019-06-05 DIAGNOSIS — J9601 Acute respiratory failure with hypoxia: Secondary | ICD-10-CM | POA: Diagnosis not present

## 2019-06-05 DIAGNOSIS — Z885 Allergy status to narcotic agent status: Secondary | ICD-10-CM | POA: Diagnosis not present

## 2019-06-05 DIAGNOSIS — Z20822 Contact with and (suspected) exposure to covid-19: Secondary | ICD-10-CM | POA: Diagnosis not present

## 2019-06-05 DIAGNOSIS — E861 Hypovolemia: Secondary | ICD-10-CM

## 2019-06-05 DIAGNOSIS — Z79899 Other long term (current) drug therapy: Secondary | ICD-10-CM | POA: Insufficient documentation

## 2019-06-05 DIAGNOSIS — Z801 Family history of malignant neoplasm of trachea, bronchus and lung: Secondary | ICD-10-CM | POA: Diagnosis not present

## 2019-06-05 DIAGNOSIS — Z87891 Personal history of nicotine dependence: Secondary | ICD-10-CM | POA: Diagnosis not present

## 2019-06-05 DIAGNOSIS — I9589 Other hypotension: Secondary | ICD-10-CM

## 2019-06-05 DIAGNOSIS — K219 Gastro-esophageal reflux disease without esophagitis: Secondary | ICD-10-CM | POA: Diagnosis not present

## 2019-06-05 DIAGNOSIS — T68XXXA Hypothermia, initial encounter: Secondary | ICD-10-CM

## 2019-06-05 DIAGNOSIS — C787 Secondary malignant neoplasm of liver and intrahepatic bile duct: Secondary | ICD-10-CM | POA: Diagnosis not present

## 2019-06-05 DIAGNOSIS — N183 Chronic kidney disease, stage 3 unspecified: Secondary | ICD-10-CM | POA: Diagnosis present

## 2019-06-05 DIAGNOSIS — A419 Sepsis, unspecified organism: Secondary | ICD-10-CM

## 2019-06-05 LAB — URINALYSIS, ROUTINE W REFLEX MICROSCOPIC
Bacteria, UA: NONE SEEN
Bilirubin Urine: NEGATIVE
Glucose, UA: NEGATIVE mg/dL
Ketones, ur: NEGATIVE mg/dL
Leukocytes,Ua: NEGATIVE
Nitrite: NEGATIVE
Protein, ur: NEGATIVE mg/dL
Specific Gravity, Urine: 1.019 (ref 1.005–1.030)
pH: 5 (ref 5.0–8.0)

## 2019-06-05 LAB — CBC WITH DIFFERENTIAL/PLATELET
Abs Immature Granulocytes: 0.6 10*3/uL — ABNORMAL HIGH (ref 0.00–0.07)
Basophils Absolute: 0.1 10*3/uL (ref 0.0–0.1)
Basophils Relative: 1 %
Eosinophils Absolute: 0 10*3/uL (ref 0.0–0.5)
Eosinophils Relative: 1 %
HCT: 45.5 % (ref 36.0–46.0)
Hemoglobin: 13.8 g/dL (ref 12.0–15.0)
Immature Granulocytes: 9 %
Lymphocytes Relative: 6 %
Lymphs Abs: 0.4 10*3/uL — ABNORMAL LOW (ref 0.7–4.0)
MCH: 33.3 pg (ref 26.0–34.0)
MCHC: 30.3 g/dL (ref 30.0–36.0)
MCV: 109.6 fL — ABNORMAL HIGH (ref 80.0–100.0)
Monocytes Absolute: 0.3 10*3/uL (ref 0.1–1.0)
Monocytes Relative: 4 %
Neutro Abs: 5.1 10*3/uL (ref 1.7–7.7)
Neutrophils Relative %: 79 %
Platelets: 30 10*3/uL — ABNORMAL LOW (ref 150–400)
RBC: 4.15 MIL/uL (ref 3.87–5.11)
RDW: 21.1 % — ABNORMAL HIGH (ref 11.5–15.5)
WBC: 6.4 10*3/uL (ref 4.0–10.5)
nRBC: 16.3 % — ABNORMAL HIGH (ref 0.0–0.2)

## 2019-06-05 LAB — PROTIME-INR
INR: 2.3 — ABNORMAL HIGH (ref 0.8–1.2)
Prothrombin Time: 25.6 seconds — ABNORMAL HIGH (ref 11.4–15.2)

## 2019-06-05 LAB — LACTIC ACID, PLASMA: Lactic Acid, Venous: >11 mmol/L (ref 0.5–1.9)

## 2019-06-05 MED ORDER — LACTATED RINGERS IV BOLUS (SEPSIS)
1000.0000 mL | Freq: Once | INTRAVENOUS | Status: AC
  Administered 2019-06-05: 23:00:00 1000 mL via INTRAVENOUS

## 2019-06-05 MED ORDER — VANCOMYCIN HCL IN DEXTROSE 1-5 GM/200ML-% IV SOLN
1000.0000 mg | Freq: Once | INTRAVENOUS | Status: AC
  Administered 2019-06-06: 1000 mg via INTRAVENOUS
  Filled 2019-06-05: qty 200

## 2019-06-05 MED ORDER — METRONIDAZOLE IN NACL 5-0.79 MG/ML-% IV SOLN
500.0000 mg | Freq: Once | INTRAVENOUS | Status: AC
  Administered 2019-06-05: 500 mg via INTRAVENOUS
  Filled 2019-06-05: qty 100

## 2019-06-05 MED ORDER — SODIUM CHLORIDE 0.9 % IV SOLN
2.0000 g | Freq: Once | INTRAVENOUS | Status: AC
  Administered 2019-06-05: 23:00:00 2 g via INTRAVENOUS
  Filled 2019-06-05: qty 2

## 2019-06-05 MED ORDER — LACTATED RINGERS IV BOLUS (SEPSIS)
250.0000 mL | Freq: Once | INTRAVENOUS | Status: AC
  Administered 2019-06-05: 250 mL via INTRAVENOUS

## 2019-06-05 NOTE — ED Triage Notes (Signed)
Pt brought in Ems after daughter called out for "sickness". Abdominal pain 3-4 weeks. Started chemo yesterday. Took am pill but not pm. Family sts bone and breast cancer.   Vs  bp 60/40  Hr 85 rr 20-30

## 2019-06-05 NOTE — ED Provider Notes (Signed)
[facility] DEPT Provider Note   CSN: [id number] Arrival date & time: 06/19/2019  2233   Time seen 11:06 PM  History Chief Complaint  Patient presents with   Hypotension   Level 5 caveat for altered mental status  Caitlin Wall is a 64 y.o. female.  HPI   When I asked the patient what is going on tonight she states "I do not know".  A lot of her speech is incomprehensible.  Per EMS patient has been having abdominal pain for 3 or 4 weeks.  She started oral chemo pills yesterday at home.  Reportedly family states patient has breast cancer and bone mets.  Looking at her chart she is followed by Dr. Jana Hakim and she has metastatic estrogen receptor positive breast cancer. Her current treatment is denosumab/xgeva and capecitabine to start on April 7. She was last seen on 4/6.  She has also been getting palliative radiation therapy to the lumbosacral spine.  Her last treatment was on April 6.  She did not get any relief of her pain.  At that visit she complained of a lot of nausea and lack of appetite.  She sometimes has vomiting.  PCP Biagio Borg, MD. Oncology Dr Magrinot  PT IS FULL CODE  Past Medical History:  Diagnosis Date   Acute asthma 07/27/2012   Breast cancer ([place]) 12/28/14   Left BresastDCIS   COLONIC POLYPS, HX OF 11/04/2007   DVT (deep venous thrombosis) ([place]) 2018   left leg   GERD 11/04/2007   GERD (gastroesophageal reflux disease)    HAIR LOSS 11/04/2007   Hearing loss    bilateral - wears hearing aids   Hx of cardiac cath 11/15/2016   a. LHC 11/15/16 showed normal coronaries and EF 50-55% with normal EDP. (done for false positive NST)   Hx of colonic polyps 1995   adenomatous   Hyperlipidemia    HYPERLIPIDEMIA 11/04/2007   Hypertension    HYPERTENSION 06/07/2009   LBBB (left bundle branch block) 07/26/2011   no current problems   Personal history of radiation therapy    S/P radiation therapy 02/02/15-03/24/15   left  breast 60.4GY    Patient Active Problem List   Diagnosis Date Noted   Liver metastases ([place]) 05/03/2019   CKD (chronic kidney disease) stage 3, GFR 30-59 ml/min 12/30/2018   Chronic asthma, mild persistent, uncomplicated [id number]   DOE (dyspnea on exertion) 12/01/2018   Chest tightness 12/01/2018   Aortic atherosclerosis ([place]) 07/31/2018   Bone metastases ([place]) 02/24/2018   Pain from bone metastases ([place]) 02/24/2018   Sternal fracture 01/09/2018   Hematochezia 01/09/2018   Gastritis 01/09/2018   Cough 05/31/2017   Colitis 05/31/2017   S/P cardiac cath 12/04/2016   Palpitations 10/23/2016   Chest pain 10/23/2016   Left knee pain 04/16/2016   Left leg swelling 04/16/2016   Hemoptysis 04/16/2016   Right-sided chest pain 04/16/2016   Acute deep vein thrombosis (DVT) of distal vein of left lower extremity ([place]) 04/16/2016   Malignant neoplasm of lower-outer quadrant of left breast of female, estrogen receptor positive ([place]) 01/16/2015   Back pain, thoracic 02/22/2013   Lung nodules 10/14/2012   Seasonal and perennial allergic rhinitis 07/27/2012   Asthma, mild intermittent, well-controlled 07/27/2012   LBBB (left bundle branch block) 07/26/2011   Goals of care, counseling/discussion 05/18/2010   Essential hypertension 06/07/2009   Hyperlipidemia 11/04/2007   GERD 11/04/2007   COLONIC POLYPS, HX OF 11/04/2007    Past Surgical History:  Procedure Laterality Date   ABDOMINAL HYSTERECTOMY     partial   ANTERIOR FUSION CERVICAL SPINE     bilat ear surgury/hearing loss     wears hearing aids   BREAST LUMPECTOMY Left 2016   BREAST LUMPECTOMY WITH RADIOACTIVE SEED LOCALIZATION Left 12/28/2014   Procedure: BREAST LUMPECTOMY WITH RADIOACTIVE SEED LOCALIZATION;  Surgeon: Autumn Messing III, MD;  Location: [facility];  Service: General;  Laterality: Left;   COLONOSCOPY     LEFT HEART CATH AND CORONARY ANGIOGRAPHY N/A 11/15/2016    Procedure: LEFT HEART CATH AND CORONARY ANGIOGRAPHY;  Surgeon: Belva Crome, MD;  Location: [facility];  Service: Cardiovascular;  Laterality: N/A;   POLYPECTOMY     UPPER GASTROINTESTINAL ENDOSCOPY     VULVECTOMY PARTIAL N/A 02/02/2019   Procedure: VULVECTOMY PARTIAL;  Surgeon: Newton Pigg, MD;  Location: [facility];  Service: Gynecology;  Laterality: N/A;   WISDOM TOOTH EXTRACTION       OB History   No obstetric history on file.     Family History  Problem Relation Age of Onset   Breast cancer Other    Cancer Paternal Grandmother        breast   Lung cancer Father    Breast cancer Maternal Grandmother    Healthy Mother    Coronary artery disease Neg Hx    Colon cancer Neg Hx    Pancreatic cancer Neg Hx    Rectal cancer Neg Hx    Stomach cancer Neg Hx    Esophageal cancer Neg Hx     Social History   Tobacco Use   Smoking status: Former Smoker    Packs/day: 1.00    Years: 20.00    Pack years: 20.00    Types: Cigarettes    Quit date: 08/19/1989    Years since quitting: 29.8   Smokeless tobacco: Never Used  Substance Use Topics   Alcohol use: Yes    Comment: rare   Drug use: No    Home Medications Prior to Admission medications   Medication Sig Start Date End Date Taking? Authorizing Provider  budesonide-formoterol (SYMBICORT) 160-4.5 MCG/ACT inhaler Inhale 2 puffs into the lungs 2 (two) times daily. 12/01/18  Yes Biagio Borg, MD  capecitabine (XELODA) 500 MG tablet Take 3 tablets (1,500 mg total) by mouth 2 (two) times daily after a meal. Take for 14 days, then do not take for 7 days, then repeat. 05/03/19  Yes Magrinat, Virgie Dad, MD  dexamethasone (DECADRON) 2 MG tablet Take 2 tablets (4 mg total) by mouth in the morning. 05/21/19  Yes Magrinat, Virgie Dad, MD  fluconazole (DIFLUCAN) 100 MG tablet Take 1 tablet (100 mg total) by mouth daily. 06/01/19  Yes Magrinat, Virgie Dad, MD  morphine 10 MG/5ML solution Take 2.5-5 mLs  (5-10 mg total) by mouth every 4 (four) hours as needed for severe pain. 06/01/19  Yes Magrinat, Virgie Dad, MD  ondansetron (ZOFRAN) 4 MG tablet Take 1 tablet (4 mg total) by mouth 3 (three) times daily before meals. 06/01/19  Yes Magrinat, Virgie Dad, MD  DULoxetine (CYMBALTA) 30 MG capsule Take 1 capsule (30 mg total) by mouth daily. Patient not taking: Reported on 05/13/2019 05/12/19   Gardenia Phlegm, NP  loratadine (CLARITIN) 10 MG tablet Take 1 tablet (10 mg total) by mouth daily. Patient not taking: Reported on 2019-06-25 05/21/19   Magrinat, Virgie Dad, MD  morphine (MS CONTIN) 15 MG 12 hr tablet Take 1 tablet (  15 mg total) by mouth every 8 (eight) hours. Patient taking differently: Take 15 mg by mouth every 12 (twelve) hours.  06/01/19   Magrinat, Virgie Dad, MD  promethazine (PHENERGAN) 25 MG suppository Place 1 suppository (25 mg total) rectally every 6 (six) hours as needed for nausea or vomiting. 06/01/19   Magrinat, Virgie Dad, MD    Allergies    Codeine and Tramadol hcl  Review of Systems   Review of Systems  Unable to perform ROS: Mental status change    Physical Exam Updated Vital Signs BP 95/85    Pulse 80    Temp (!) 94.8 F (34.9 C)    Resp (!) 21    Ht 5\' 5"  (1.651 m)    Wt 69.9 kg    LMP  (LMP Unknown)    SpO2 96%    BMI 25.63 kg/m   Physical Exam Vitals and nursing note reviewed.  Constitutional:      Appearance: She is ill-appearing.     Comments: Patient's color is very bad  HENT:     Head: Normocephalic and atraumatic.     Right Ear: External ear normal.     Left Ear: External ear normal.  Eyes:     General: Scleral icterus present.     Extraocular Movements: Extraocular movements intact.     Conjunctiva/sclera: Conjunctivae normal.     Pupils: Pupils are equal, round, and reactive to light.  Cardiovascular:     Rate and Rhythm: Normal rate and regular rhythm.  Pulmonary:     Effort: Tachypnea and respiratory distress present.     Breath sounds: Normal breath  sounds.  Musculoskeletal:        General: No deformity.     Cervical back: Normal range of motion.  Skin:    Comments: Skin cold to touch  Neurological:     Comments: Patient is unable to cooperate but she is moving her arms normally  Psychiatric:        Mood and Affect: Affect is flat.        Speech: Speech is delayed.     ED Results / Procedures / Treatments   Labs (all labs ordered are listed, but only abnormal results are displayed) Results for orders placed or performed during the hospital encounter of 06/24/2019  Culture, blood (Routine x 2)   Specimen: BLOOD  Result Value Ref Range   Specimen Description BLOOD LEFT ANTECUBITAL    Special Requests      BOTTLES DRAWN AEROBIC AND ANAEROBIC Blood Culture results may not be optimal due to an excessive volume of blood received in culture bottles Performed at [facility] [address], [place], [place] [address]    Culture PENDING    Report Status PENDING   Culture, blood (Routine x 2)   Specimen: BLOOD  Result Value Ref Range   Specimen Description BLOOD RIGHT ANTECUBITAL    Special Requests      BOTTLES DRAWN AEROBIC AND ANAEROBIC Blood Culture adequate volume Performed at [facility] [address] [place],  [address]    Culture PENDING    Report Status PENDING   Respiratory Panel by RT PCR (Flu A&B, Covid) - Nasopharyngeal Swab   Specimen: Nasopharyngeal Swab  Result Value Ref Range   SARS Coronavirus 2 by RT PCR NEGATIVE NEGATIVE   Influenza A by PCR NEGATIVE NEGATIVE   Influenza B by PCR NEGATIVE NEGATIVE  Comprehensive metabolic panel  Result Value Ref Range  Sodium 132 (L) 135 - 145 mmol/L   Potassium >7.5 (HH) 3.5 - 5.1 mmol/L   Chloride 97 (L) 98 - 111 mmol/L   CO2 7 (L) 22 - 32 mmol/L   Glucose, Bld 101 (H) 70 - 99 mg/dL   BUN 77 (H) 8 - 23 mg/dL   Creatinine, Ser 2.36 (H) 0.44 - 1.00 mg/dL   Calcium 8.2 (L) 8.9 - 10.3 mg/dL   Total Protein 5.1 (L) 6.5  - 8.1 g/dL   Albumin 1.9 (L) 3.5 - 5.0 g/dL   AST 2,575 (H) 15 - 41 U/L   ALT 508 (H) 0 - 44 U/L   Alkaline Phosphatase 768 (H) 38 - 126 U/L   Total Bilirubin 7.4 (H) 0.3 - 1.2 mg/dL   GFR calc non Af Amer 21 (L) >60 mL/min   GFR calc Af Amer 25 (L) >60 mL/min   Anion gap 28 (H) 5 - 15  Lactic acid, plasma  Result Value Ref Range   Lactic Acid, Venous >11.0 (HH) 0.5 - 1.9 mmol/L  Lactic acid, plasma  Result Value Ref Range   Lactic Acid, Venous >11.0 (HH) 0.5 - 1.9 mmol/L  CBC with Differential  Result Value Ref Range   WBC 6.4 4.0 - 10.5 K/uL   RBC 4.15 3.87 - 5.11 MIL/uL   Hemoglobin 13.8 12.0 - 15.0 g/dL   HCT 45.5 36.0 - 46.0 %   MCV 109.6 (H) 80.0 - 100.0 fL   MCH 33.3 26.0 - 34.0 pg   MCHC 30.3 30.0 - 36.0 g/dL   RDW 21.1 (H) 11.5 - 15.5 %   Platelets 30 (L) 150 - 400 K/uL   nRBC 16.3 (H) 0.0 - 0.2 %   Neutrophils Relative % 79 %   Neutro Abs 5.1 1.7 - 7.7 K/uL   Lymphocytes Relative 6 %   Lymphs Abs 0.4 (L) 0.7 - 4.0 K/uL   Monocytes Relative 4 %   Monocytes Absolute 0.3 0.1 - 1.0 K/uL   Eosinophils Relative 1 %   Eosinophils Absolute 0.0 0.0 - 0.5 K/uL   Basophils Relative 1 %   Basophils Absolute 0.1 0.0 - 0.1 K/uL   WBC Morphology MILD LEFT SHIFT (1-5% METAS, OCC MYELO, OCC BANDS)    Immature Granulocytes 9 %   Abs Immature Granulocytes 0.60 (H) 0.00 - 0.07 K/uL   Burr Cells PRESENT    Polychromasia PRESENT   Protime-INR  Result Value Ref Range   Prothrombin Time 25.6 (H) 11.4 - 15.2 seconds   INR 2.3 (H) 0.8 - 1.2  Urinalysis, Routine w reflex microscopic  Result Value Ref Range   Color, Urine AMBER (A) YELLOW   APPearance CLEAR CLEAR   Specific Gravity, Urine 1.019 1.005 - 1.030   pH 5.0 5.0 - 8.0   Glucose, UA NEGATIVE NEGATIVE mg/dL   Hgb urine dipstick MODERATE (A) NEGATIVE   Bilirubin Urine NEGATIVE NEGATIVE   Ketones, ur NEGATIVE NEGATIVE mg/dL   Protein, ur NEGATIVE NEGATIVE mg/dL   Nitrite NEGATIVE NEGATIVE   Leukocytes,Ua NEGATIVE NEGATIVE    WBC, UA 0-5 0 - 5 WBC/hpf   Bacteria, UA NONE SEEN NONE SEEN   Hyaline Casts, UA PRESENT    Laboratory interpretation all normal except acute renal injury, hyperkalemia without hemolysis, marked elevation of her LFTs which have been elevated on recent lab work, lactic acidosis, thrombocytopenia, prolonged INR       EKG EKG Interpretation  Date/Time:  Saturday June 05 2019 23:16:26 EDT Ventricular Rate:  81 PR  Interval:    QRS Duration: 150 QT Interval:  452 QTC Calculation: 525 R Axis:   95 Text Interpretation: Sinus rhythm Nonspecific intraventricular conduction delay Probable anteroseptal infarct, recent Confirmed by Rolland Porter [phone]) on 06/04/2019 11:50:23 PM   Radiology DG Chest Port 1 View  Result Date: 06/17/2019 CLINICAL DATA:  History of breast cancer with abdominal pain. EXAM: PORTABLE CHEST 1 VIEW COMPARISON:  April 23, 2019 FINDINGS: Mild, predominant stable bilateral infrahilar scarring, atelectasis and/or infiltrate is seen. There is no evidence of a pleural effusion or pneumothorax. The heart size and mediastinal contours are within normal limits. Multilevel degenerative changes seen throughout the thoracic spine. IMPRESSION: Mild, predominant stable bilateral infrahilar scarring, atelectasis and/or infiltrate. Electronically Signed   By: Virgina Norfolk M.D.   On: 06/19/2019 23:43    US BIOPSY (LIVER)  Result Date: 05/18/2019 INDICATION: 64 year old female with breast cancer and multifocal hepatic lesions concerning for metastatic disease. She presents for ultrasound-guided core biopsy of the same. Marland Kitchen IMPRESSION: Technically successful ultrasound-guided core biopsy of liver lesion. Signed, Criselda Peaches, MD, [facility] Radiology Electronically Signed   By: Jacqulynn Cadet M.D.   On: 05/18/2019 18:35    MR TOTAL SPINE METS SCREENING  Result Date: 05/12/2019 CLINICAL DATA:  Widely metastatic  breast cancer. Burning and numbness in the legs. Evaluate for cord compression.  IMPRESSION: Widespread osseous metastatic disease throughout the spine. Fatty change of the marrow from T6 through T9 consistent with previous radiation. No evidence of extraosseous tumor extension, pathologic fracture or primary neural metastasis in the cervical, thoracic or lumbar region. More extensive metastatic disease within the sacrum, particularly on the right. I think there is early tumor extension into the foramen on the right at the S1 foramen, which could explain right S1 symptomatology. Electronically Signed   By: Nelson Chimes M.D.   On: 05/12/2019 15:35   Procedures .Critical Care Performed by: Rolland Porter, MD Authorized by: Rolland Porter, MD   Critical care provider statement:    Critical care time (minutes):  45   Critical care was necessary to treat or prevent imminent or life-threatening deterioration of the following conditions:  Circulatory failure, CNS failure or compromise, dehydration, hepatic failure and renal failure   Critical care was time spent personally by me on the following activities:  Development of treatment plan with patient or surrogate, discussions with consultants, examination of patient, obtaining history from patient or surrogate, ordering and review of laboratory studies, ordering and review of radiographic studies, pulse oximetry and re-evaluation of patient's condition   (including critical care time)  Medications Ordered in ED Medications  morphine 2 MG/ML injection 2 mg (has no administration in time range)  lactated ringers bolus 1,000 mL (0 mLs Intravenous Stopped 06-19-2019 0052)    And  lactated ringers bolus 1,000 mL (0 mLs Intravenous Stopped 06/19/19 0052)    And  lactated ringers bolus 250 mL (0 mLs Intravenous Stopped 06/19/19 0052)  ceFEPIme (MAXIPIME) 2 g in sodium chloride 0.9 % 100 mL IVPB (0 g Intravenous Stopped 06/19/2019 2357)  metroNIDAZOLE (FLAGYL) IVPB 500 mg  (0 mg Intravenous Stopped 2019-06-19 0034)  vancomycin (VANCOCIN) IVPB 1000 mg/200 mL premix (0 mg Intravenous Stopped June 19, 2019 0119)  calcium chloride injection 1 g (1 g Intravenous Given 06-19-19 0034)  dextrose 50 % solution 50 mL (50 mLs Intravenous Given June 19, 2019 0041)  insulin aspart (novoLOG) injection 5 Units (5 Units Intravenous Given Jun 19, 2019 0039)  sodium bicarbonate injection 50 mEq (50  mEq Intravenous Given 06/21/19 0037)  sodium zirconium cyclosilicate (LOKELMA) packet 10 g (10 g Oral Given 21-Jun-2019 0043)  lactated ringers bolus 1,000 mL (0 mLs Intravenous Stopped 06/21/19 0256)    ED Course  I have reviewed the triage vital signs and the nursing notes.  Pertinent labs & imaging results that were available during my care of the patient were reviewed by me and considered in my medical decision making (see chart for details).    MDM Rules/Calculators/A&P                       Patient was started on sepsis protocol, blood cultures were obtained.  She was given fluid bolus of 2250 cc of lactated Ringer's based on her weight.  She was started on antibiotics with unknown source.  She received Maxipime, Flagyl, and vancomycin.  12:00 AM nurse reports lab is called that her potassium is greater than 7.5 and not hemolyzed.   Pt treated for hyperkalemia with IV calcium, IV bicarb, IV insulin and D50 and given oral lokelma.   Recheck at 12:30 AM patient's color is improving, she is still seems a little confused.  She is on a Retail [employer] for her hypothermia.  She has been getting her IV antibiotics infused in her IV boluses as per sepsis protocol.  Her current blood pressure is 71/45.  Recheck 1:30 AM patient's blood pressure is 117/59.  Her temperature is now 92.5.  After reviewing her notes from Dr. Jana Hakim it appears she has not been eating well due to nausea and loss of appetite for a long time, she was given 1/3 L of lactated Ringer's.  I felt that she was probably very  dehydrated.  Patient's blood pressure started dropping again around 3:00 AM.  She also appeared to be getting tachypneic.  I spoke to patient's spouse, [name] will forward at 67 to 3:20 AM we had discussed that her kidney function is worse, she is now in renal failure, and we were at the point where we had to consider whether we wanted to intubate her and support her blood pressure if needed with pressors.  He has talked to some family members who are present and they have decided they want to do comfort care.  I had spoken to Dr. Lucile Shutters, critical care at 312 and he is asked me to call back after I spoke to the husband (I was on the phone with the husband when he called back).  I recalled him back at 3:20 to let him know that family now wants comfort care.  He wants the hospitalist to admit.  4:00 AM husband has arrived.  4:02 AM patient discussed with Dr. Myna Hidalgo, hospitalist he will admit for comfort care.     Final Clinical Impression(s) / ED Diagnoses Final diagnoses:  Hypotension due to hypovolemia  Hypothermia, initial encounter  Elevated liver enzymes  Hyperkalemia  Acute kidney injury ([place])  Metastatic breast cancer [facility])    Rx / DC Orders  Plan admission  Rolland Porter, MD, Barbette Or, MD 21-Jun-2019 [phone]

## 2019-06-05 NOTE — Progress Notes (Signed)
A consult was received from an ED physician for Vancomycin, cefepime per pharmacy dosing.  The patient's profile has been reviewed for ht/wt/allergies/indication/available labs.   A one time order has been placed for Vancomycin 1gm iv x1, and Cefepime 2gm iv x1.  Further antibiotics/pharmacy consults should be ordered by admitting physician if indicated.                       Thank you, Nani Skillern Crowford 06/03/2019  11:19 PM

## 2019-06-05 NOTE — ED Notes (Signed)
Pt. Documented in error see above note in chart. 

## 2019-06-06 ENCOUNTER — Encounter (HOSPITAL_COMMUNITY): Payer: Self-pay | Admitting: Family Medicine

## 2019-06-06 DIAGNOSIS — E875 Hyperkalemia: Secondary | ICD-10-CM | POA: Diagnosis present

## 2019-06-06 DIAGNOSIS — N179 Acute kidney failure, unspecified: Secondary | ICD-10-CM | POA: Diagnosis present

## 2019-06-06 DIAGNOSIS — K7200 Acute and subacute hepatic failure without coma: Secondary | ICD-10-CM | POA: Diagnosis present

## 2019-06-06 DIAGNOSIS — R579 Shock, unspecified: Secondary | ICD-10-CM | POA: Diagnosis not present

## 2019-06-06 DIAGNOSIS — J9601 Acute respiratory failure with hypoxia: Secondary | ICD-10-CM | POA: Diagnosis not present

## 2019-06-06 DIAGNOSIS — C50919 Malignant neoplasm of unspecified site of unspecified female breast: Secondary | ICD-10-CM | POA: Insufficient documentation

## 2019-06-06 DIAGNOSIS — R748 Abnormal levels of other serum enzymes: Secondary | ICD-10-CM | POA: Insufficient documentation

## 2019-06-06 DIAGNOSIS — N183 Chronic kidney disease, stage 3 unspecified: Secondary | ICD-10-CM | POA: Diagnosis present

## 2019-06-06 DIAGNOSIS — Z170 Estrogen receptor positive status [ER+]: Secondary | ICD-10-CM

## 2019-06-06 DIAGNOSIS — T68XXXA Hypothermia, initial encounter: Secondary | ICD-10-CM | POA: Insufficient documentation

## 2019-06-06 DIAGNOSIS — N1831 Chronic kidney disease, stage 3a: Secondary | ICD-10-CM

## 2019-06-06 DIAGNOSIS — C50512 Malignant neoplasm of lower-outer quadrant of left female breast: Secondary | ICD-10-CM

## 2019-06-06 LAB — RESPIRATORY PANEL BY RT PCR (FLU A&B, COVID)
Influenza A by PCR: NEGATIVE
Influenza B by PCR: NEGATIVE
SARS Coronavirus 2 by RT PCR: NEGATIVE

## 2019-06-06 LAB — COMPREHENSIVE METABOLIC PANEL
ALT: 508 U/L — ABNORMAL HIGH (ref 0–44)
AST: 2575 U/L — ABNORMAL HIGH (ref 15–41)
Albumin: 1.9 g/dL — ABNORMAL LOW (ref 3.5–5.0)
Alkaline Phosphatase: 768 U/L — ABNORMAL HIGH (ref 38–126)
Anion gap: 28 — ABNORMAL HIGH (ref 5–15)
BUN: 77 mg/dL — ABNORMAL HIGH (ref 8–23)
CO2: 7 mmol/L — ABNORMAL LOW (ref 22–32)
Calcium: 8.2 mg/dL — ABNORMAL LOW (ref 8.9–10.3)
Chloride: 97 mmol/L — ABNORMAL LOW (ref 98–111)
Creatinine, Ser: 2.36 mg/dL — ABNORMAL HIGH (ref 0.44–1.00)
GFR calc Af Amer: 25 mL/min — ABNORMAL LOW (ref >60)
GFR calc non Af Amer: 21 mL/min — ABNORMAL LOW (ref >60)
Glucose, Bld: 101 mg/dL — ABNORMAL HIGH (ref 70–99)
Potassium: >7.5 mmol/L (ref 3.5–5.1)
Sodium: 132 mmol/L — ABNORMAL LOW (ref 135–145)
Total Bilirubin: 7.4 mg/dL — ABNORMAL HIGH (ref 0.3–1.2)
Total Protein: 5.1 g/dL — ABNORMAL LOW (ref 6.5–8.1)

## 2019-06-06 LAB — LACTIC ACID, PLASMA: Lactic Acid, Venous: >11 mmol/L (ref 0.5–1.9)

## 2019-06-06 MED ORDER — LORAZEPAM 2 MG/ML IJ SOLN: 1.0000 mg | INTRAMUSCULAR | Status: DC | PRN

## 2019-06-06 MED ORDER — POLYVINYL ALCOHOL 1.4 % OP SOLN
1.0000 [drp] | Freq: Four times a day (QID) | OPHTHALMIC | Status: DC | PRN
  Filled 2019-06-06: qty 15

## 2019-06-06 MED ORDER — GLYCOPYRROLATE 0.2 MG/ML IJ SOLN: 0.2000 mg | INTRAMUSCULAR | Status: DC | PRN

## 2019-06-06 MED ORDER — HALOPERIDOL LACTATE 5 MG/ML IJ SOLN: 0.5000 mg | INTRAMUSCULAR | Status: DC | PRN

## 2019-06-06 MED ORDER — SODIUM CHLORIDE 0.9 % IV SOLN: 250.0000 mL | INTRAVENOUS | Status: DC | PRN

## 2019-06-06 MED ORDER — DIPHENHYDRAMINE HCL 50 MG/ML IJ SOLN: 12.5000 mg | INTRAMUSCULAR | Status: DC | PRN

## 2019-06-06 MED ORDER — LACTATED RINGERS IV BOLUS
1000.0000 mL | Freq: Once | INTRAVENOUS | Status: AC
  Administered 2019-06-06: 1000 mL via INTRAVENOUS

## 2019-06-06 MED ORDER — CALCIUM CHLORIDE 10 % IV SOLN: 1.0000 g | Freq: Once | INTRAVENOUS | Status: AC

## 2019-06-06 MED ORDER — ACETAMINOPHEN 325 MG PO TABS: 650.0000 mg | ORAL_TABLET | Freq: Four times a day (QID) | ORAL | Status: DC | PRN

## 2019-06-06 MED ORDER — LORAZEPAM 2 MG/ML PO CONC: 1.0000 mg | ORAL | Status: DC | PRN

## 2019-06-06 MED ORDER — MORPHINE SULFATE (PF) 2 MG/ML IV SOLN: 1.0000 mg | INTRAVENOUS | Status: DC | PRN

## 2019-06-06 MED ORDER — HALOPERIDOL LACTATE 2 MG/ML PO CONC
0.5000 mg | ORAL | Status: DC | PRN
  Filled 2019-06-06: qty 0.3

## 2019-06-06 MED ORDER — MORPHINE SULFATE (PF) 2 MG/ML IV SOLN
2.0000 mg | Freq: Once | INTRAVENOUS | Status: DC
  Filled 2019-06-06: qty 1

## 2019-06-06 MED ORDER — DEXTROSE 50 % IV SOLN
1.0000 | Freq: Once | INTRAVENOUS | Status: AC
  Administered 2019-06-06: 01:00:00 50 mL via INTRAVENOUS
  Filled 2019-06-06: qty 50

## 2019-06-06 MED ORDER — ONDANSETRON HCL 4 MG/2ML IJ SOLN: 4.0000 mg | Freq: Four times a day (QID) | INTRAMUSCULAR | Status: DC | PRN

## 2019-06-06 MED ORDER — SODIUM CHLORIDE 0.9% FLUSH: 3.0000 mL | Freq: Two times a day (BID) | INTRAVENOUS | Status: DC

## 2019-06-06 MED ORDER — LORAZEPAM 1 MG PO TABS: 1.0000 mg | ORAL_TABLET | ORAL | Status: DC | PRN

## 2019-06-06 MED ORDER — MAGIC MOUTHWASH
15.0000 mL | Freq: Four times a day (QID) | ORAL | Status: DC | PRN
  Filled 2019-06-06: qty 15

## 2019-06-06 MED ORDER — INSULIN ASPART 100 UNIT/ML ~~LOC~~ SOLN
5.0000 [IU] | Freq: Once | SUBCUTANEOUS | Status: AC
  Administered 2019-06-06: 5 [IU] via INTRAVENOUS
  Filled 2019-06-06: qty 0.05

## 2019-06-06 MED ORDER — BIOTENE DRY MOUTH MT LIQD: 15.0000 mL | OROMUCOSAL | Status: DC | PRN

## 2019-06-06 MED ORDER — SODIUM CHLORIDE 0.9% FLUSH: 3.0000 mL | INTRAVENOUS | Status: DC | PRN

## 2019-06-06 MED ORDER — SODIUM ZIRCONIUM CYCLOSILICATE 10 G PO PACK
10.0000 g | PACK | Freq: Once | ORAL | Status: AC
  Administered 2019-06-06: 10 g via ORAL
  Filled 2019-06-06: qty 1

## 2019-06-06 MED ORDER — CALCIUM CHLORIDE 10 % IV SOLN
INTRAVENOUS | Status: AC
  Administered 2019-06-06: 01:00:00 1 g via INTRAVENOUS
  Filled 2019-06-06: qty 10

## 2019-06-06 MED ORDER — ACETAMINOPHEN 650 MG RE SUPP: 650.0000 mg | Freq: Four times a day (QID) | RECTAL | Status: DC | PRN

## 2019-06-06 MED ORDER — SODIUM BICARBONATE 8.4 % IV SOLN
50.0000 meq | Freq: Once | INTRAVENOUS | Status: AC
  Administered 2019-06-06: 50 meq via INTRAVENOUS
  Filled 2019-06-06: qty 50

## 2019-06-06 MED ORDER — GLYCOPYRROLATE 1 MG PO TABS
1.0000 mg | ORAL_TABLET | ORAL | Status: DC | PRN
  Filled 2019-06-06: qty 1

## 2019-06-06 MED ORDER — HALOPERIDOL 1 MG PO TABS: 0.5000 mg | ORAL_TABLET | ORAL | Status: DC | PRN

## 2019-06-06 MED ORDER — ONDANSETRON 4 MG PO TBDP: 4.0000 mg | ORAL_TABLET | Freq: Four times a day (QID) | ORAL | Status: DC | PRN

## 2019-06-07 LAB — URINE CULTURE: Culture: NO GROWTH

## 2019-06-09 ENCOUNTER — Inpatient Hospital Stay: Payer: BC Managed Care – PPO

## 2019-06-09 ENCOUNTER — Inpatient Hospital Stay: Payer: BC Managed Care – PPO | Admitting: Adult Health

## 2019-06-11 LAB — CULTURE, BLOOD (ROUTINE X 2)
Culture: NO GROWTH
Culture: NO GROWTH
Special Requests: ADEQUATE

## 2019-06-26 NOTE — ED Notes (Signed)
Provider at bedside to assess patient.

## 2019-06-26 NOTE — ED Notes (Signed)
Dr.I Tomi Bamberger spoke with patient and family in regards to comfort care and code status. Dr. Eliane Decree filled out DNR form with husband as witnessed.

## 2019-06-26 NOTE — ED Notes (Signed)
Time of death 39. Dr. Myna Hidalgo.

## 2019-06-26 NOTE — ED Notes (Signed)
Husband present in room

## 2019-06-26 NOTE — ED Notes (Signed)
Family present in room to visit patient

## 2019-06-26 NOTE — Death Summary Note (Signed)
Death Summary  Caitlin Wall [id number] DOB: 1955/05/09 DOA: 27-Jun-2019  PCP: Biagio Borg, MD  Admit date: 06-27-19 Date of Death: 06/28/19 Time of Death: 05:05  Notification: Biagio Borg, MD notified of death of 06-28-2019   History of present illness:  Caitlin Wall is a 64 y.o. female with a history of asthma, hypertension, chronic kidney disease, and breast cancer with metastases to bone and liver, admitted to the hospital the morning of 06-28-19 after presenting with several days of abdominal pain, nausea, vomiting, and progressive fatigue and malaise.  Patient was found to be hypotensive with EMS in route to the hospital, was hypothermic in the emergency department, and found to have acute renal failure with critically elevated potassium, acute liver failure, and lactic acid persisting at >11 despite aggressive fluid resuscitation.  Patient was cultured, fluid resuscitated, started on broad-spectrum antibiotics, placed on warming blankets, and treated with calcium, dextrose/insulin, bicarbonate, and Lokelma.  She continued to worsen and went into acute respiratory failure.  After initial improvement, blood pressure began to plummet.  The patient's husband arrived and noted that the patient would want [place] to focus on just keeping her comfortable at this point, understanding that she would likely pass at any moment.  Respecting the patient's wishes, CODE STATUS was changed to DNR and goal of care became patient comfort.  Patient's son arrived at the bedside and was there with the patient's husband as she passed.  Final Diagnoses:  1.   Shock, suspected septic  2.   Hyperkalemia  3.   Acute kidney injury superimposed on CKD IIIa  4.   Acute liver failure  5.   Metastatic breast cancer    The results of significant diagnostics from this hospitalization (including imaging, microbiology, ancillary and laboratory) are listed below for reference.    Significant  Diagnostic Studies: US BIOPSY (LIVER)  Result Date: 05/18/2019 INDICATION: 64 year old female with breast cancer and multifocal hepatic lesions concerning for metastatic disease. She presents for ultrasound-guided core biopsy of the same. EXAM: ULTRASOUND BIOPSY CORE LIVER MEDICATIONS: None. ANESTHESIA/SEDATION: Moderate (conscious) sedation was employed during this procedure. A total of Versed 2 mg and Fentanyl 100 mcg was administered intravenously. Moderate Sedation Time: 14 minutes. The patient's level of consciousness and vital signs were monitored continuously by radiology nursing throughout the procedure under my direct supervision. FLUOROSCOPY TIME:  None. COMPLICATIONS: None immediate. PROCEDURE: Informed written consent was obtained from the patient after a thorough discussion of the procedural risks, benefits and alternatives. All questions were addressed. Maximal Sterile Barrier Technique was utilized including caps, mask, sterile gowns, sterile gloves, sterile drape, hand hygiene and skin antiseptic. A timeout was performed prior to the initiation of the procedure. Ultrasound was used to interrogate the liver. Multiple hypoechoic solid liver lesions are identified. A suitable lesion in hepatic segment 5 was selected. A skin entry site was marked. The overlying skin was sterilely prepped and draped in standard fashion using chlorhexidine skin prep. Local anesthesia was attained by infiltration with 1% lidocaine. A small dermatotomy was made. Under real-time ultrasound guidance, a 17 gauge introducer needle was advanced through the liver and positioned at the margin of the mass. Multiple 18 gauge core biopsies were then coaxially obtained using the bio Pince automated biopsy device. Biopsy specimens were placed in formalin and delivered to pathology for further analysis. As the introducer needle was removed, the biopsy tract was embolized with a Gel-Foam slurry. Post biopsy ultrasound imaging  demonstrates no evidence of immediate complication.  IMPRESSION: Technically successful ultrasound-guided core biopsy of liver lesion. Signed, Criselda Peaches, MD, [facility] Electronically Signed   By: Jacqulynn Cadet M.D.   On: 05/18/2019 18:35   DG Chest Port 1 View  Result Date: 05/27/2019 CLINICAL DATA:  History of breast cancer with abdominal pain. EXAM: PORTABLE CHEST 1 VIEW COMPARISON:  April 23, 2019 FINDINGS: Mild, predominant stable bilateral infrahilar scarring, atelectasis and/or infiltrate is seen. There is no evidence of a pleural effusion or pneumothorax. The heart size and mediastinal contours are within normal limits. Multilevel degenerative changes seen throughout the thoracic spine. IMPRESSION: Mild, predominant stable bilateral infrahilar scarring, atelectasis and/or infiltrate. Electronically Signed   By: Virgina Norfolk M.D.   On: 06/10/2019 23:43   MR TOTAL SPINE METS SCREENING  Result Date: 05/12/2019 CLINICAL DATA:  Widely metastatic breast cancer. Burning and numbness in the legs. Evaluate for cord compression. EXAM: MRI TOTAL SPINE WITHOUT AND WITH CONTRAST TECHNIQUE: Multisequence MR imaging of the spine from the cervical spine to the sacrum was performed prior to and following IV contrast administration for evaluation of spinal metastatic disease. CONTRAST:  65mL GADAVIST GADOBUTROL 1 MMOL/ML IV SOLN COMPARISON:  Nuclear medicine bone scan 04/30/2019 FINDINGS: MRI CERVICAL SPINE FINDINGS Alignment: Normal Vertebrae: Osseous metastatic disease throughout the cervical region. Fusion at the C5-6 level, probably congenital. No evidence of lytic destructive bone lesion or extraosseous extension tumor. Cord: No evidence of cord metastasis. Posterior Fossa, vertebral arteries, paraspinal tissues: No significant regional soft tissue lesion seen. Disc levels: Mild non-compressive degenerative changes at C1-2, C2-3  and C3-4. At C4-5, there is degenerative spondylosis with endplate osteophytes and bulging of the disc. AP diameter of the canal is only 6.5 mm. The subarachnoid space is effaced in the cord is indented slightly. There is bilateral foraminal narrowing. No stenosis at the C5-6 fusion level. Mild non-compressive spondylosis at C6-7 and C7-T1. MRI THORACIC SPINE FINDINGS Alignment:  Normal Vertebrae: Widespread osseous metastatic disease. Previous radiation change of the T6, T7, T8 and T9 vertebrae with fatty change. Small metastases are present within the fatty areas. The other non radiated thoracic vertebral bodies show more extensive and confluent intra osseous tumor. There is no evidence of pathologic fracture. There is no visible extraosseous tumor extension. Cord: No evidence of primary cord or leptomeningeal metastatic disease. Paraspinal and other soft tissues: No significant finding. Disc levels: No significant thoracic disc disease. Mild non-compressive disc bulge at T11-12. MRI LUMBAR SPINE FINDINGS Segmentation:  5 lumbar type vertebral bodies. Alignment:  3 mm degenerative retrolisthesis L1-2. Vertebrae: Widespread osseous metastatic disease throughout the lumbar spine and the sacrum. Disease is more pronounced in the sacrum, particularly on the right. In the lumbar region, there is no fracture or extraosseous tumor extension. In the sacrum, there is probably some early tumor extension in the right S1 foramen that could affect that nerve. Conus medullaris: Extends to the L1 level and appears normal. Paraspinal and other soft tissues: Negative.  Not studied in detail. Disc levels: No significant disc pathology. No compressive stenosis of the canal. Foramina sufficiently patent. IMPRESSION: Widespread osseous metastatic disease throughout the spine. Fatty change of the marrow from T6 through T9 consistent with previous radiation. No evidence of extraosseous tumor extension, pathologic fracture or primary  neural metastasis in the cervical, thoracic or lumbar region. More extensive metastatic disease within the sacrum, particularly on the right. I think there is early tumor extension into the foramen on the right at the S1  foramen, which could explain right S1 symptomatology. Electronically Signed   By: Nelson Chimes M.D.   On: 05/12/2019 15:35    Microbiology: Recent Results (from the past 240 hour(s))  Culture, blood (Routine x 2)     Status: None (Preliminary result)   Collection Time: 05/31/2019 10:52 PM   Specimen: BLOOD  Result Value Ref Range Status   Specimen Description BLOOD LEFT ANTECUBITAL  Final   Special Requests   Final    BOTTLES DRAWN AEROBIC AND ANAEROBIC Blood Culture results may not be optimal due to an excessive volume of blood received in culture bottles Performed at [facility], [place] [address], [place], [place] [address]    Culture PENDING  Incomplete   Report Status PENDING  Incomplete  Culture, blood (Routine x 2)     Status: None (Preliminary result)   Collection Time: 06/14/2019 10:57 PM   Specimen: BLOOD  Result Value Ref Range Status   Specimen Description BLOOD RIGHT ANTECUBITAL  Final   Special Requests   Final    BOTTLES DRAWN AEROBIC AND ANAEROBIC Blood Culture adequate volume Performed at [facility], [place] [address] [place], [place] [address]    Culture PENDING  Incomplete   Report Status PENDING  Incomplete  Respiratory Panel by RT PCR (Flu A&B, Covid) - Nasopharyngeal Swab     Status: None   Collection Time: 06-13-2019  1:05 AM   Specimen: Nasopharyngeal Swab  Result Value Ref Range Status   SARS Coronavirus 2 by RT PCR NEGATIVE NEGATIVE Final    Comment: (NOTE) SARS-CoV-2 target nucleic acids are NOT DETECTED. The SARS-CoV-2 RNA is generally detectable in upper respiratoy specimens during the acute phase of infection. The lowest concentration of SARS-CoV-2 viral copies this assay can detect is 131 copies/mL. A  negative result does not preclude SARS-Cov-2 infection and should not be used as the sole basis for treatment or other patient management decisions. A negative result may occur with  improper specimen collection/handling, submission of specimen other than nasopharyngeal swab, presence of viral mutation(s) within the areas targeted by this assay, and inadequate number of viral copies (<131 copies/mL). A negative result must be combined with clinical observations, patient history, and epidemiological information. The expected result is Negative. Fact Sheet for Patients:  PinkCheek.be Fact Sheet for Healthcare Providers:  GravelBags.it This test is not yet ap proved or cleared by the [place] FDA and  has been authorized for detection and/or diagnosis of SARS-CoV-2 by FDA under an Emergency Use Authorization (EUA). This EUA will remain  in effect (meaning this test can be used) for the duration of the COVID-19 declaration under Section 564(b)(1) of the Act, 21 U.S.C. section 360bbb-3(b)(1), unless the authorization is terminated or revoked sooner.    Influenza A by PCR NEGATIVE NEGATIVE Final   Influenza B by PCR NEGATIVE NEGATIVE Final    Comment: (NOTE) The Xpert Xpress SARS-CoV-2/FLU/RSV assay is intended as an aid in  the diagnosis of influenza from Nasopharyngeal swab specimens and  should not be used as a sole basis for treatment. Nasal washings and  aspirates are unacceptable for Xpert Xpress SARS-CoV-2/FLU/RSV  testing. Fact Sheet for Patients: PinkCheek.be Fact Sheet for Healthcare Providers: GravelBags.it This test is not yet approved or cleared by the [place] FDA and  has been authorized for detection and/or diagnosis of SARS-CoV-2 by  FDA under an Emergency Use Authorization (EUA). This EUA will remain  in effect (meaning this test can be used) for  the duration of the  Covid-19 declaration under Section 564(b)(1) of the Act, 21  U.S.C. section 360bbb-3(b)(1), unless the authorization is  terminated or revoked. Performed at [facility], [place] [address] [place], [place] [address]      Labs: Basic Metabolic Panel: Recent Labs  Lab 05/31/19 0855 06/04/2019 2252  NA 138 132*  K 4.9 >7.5*  CL 103 97*  CO2 18* 7*  GLUCOSE 100* 101*  BUN 32* 77*  CREATININE 1.05* 2.36*  CALCIUM 8.6* 8.2*   Liver Function Tests: Recent Labs  Lab 05/31/19 0855 06/16/2019 2252  AST 520* 2,575*  ALT 144* 508*  ALKPHOS 679* 768*  BILITOT 3.1* 7.4*  PROT 6.3* 5.1*  ALBUMIN 2.1* 1.9*   No results for input(s): LIPASE, AMYLASE in the last 168 hours. No results for input(s): AMMONIA in the last 168 hours. CBC: Recent Labs  Lab 05/31/19 0855 06/04/2019 2252  WBC 10.1 6.4  NEUTROABS 8.1* 5.1  HGB 13.5 13.8  HCT 42.1 45.5  MCV 101.4* 109.6*  PLT 94* 30*   Cardiac Enzymes: No results for input(s): CKTOTAL, CKMB, CKMBINDEX, TROPONINI in the last 168 hours. D-Dimer No results for input(s): DDIMER in the last 72 hours. BNP: Invalid input(s): POCBNP CBG: No results for input(s): GLUCAP in the last 168 hours. Anemia work up No results for input(s): VITAMINB12, FOLATE, FERRITIN, TIBC, IRON, RETICCTPCT in the last 72 hours. Urinalysis    Component Value Date/Time   COLORURINE AMBER (A) 06/20/2019 2252   APPEARANCEUR CLEAR 05/31/2019 2252   LABSPEC 1.019 05/31/2019 2252   PHURINE 5.0 05/31/2019 2252   GLUCOSEU NEGATIVE 06/04/2019 2252   GLUCOSEU NEGATIVE 10/20/2017 1303   HGBUR MODERATE (A) 06/03/2019 2252   BILIRUBINUR NEGATIVE 06/02/2019 2252   KETONESUR NEGATIVE 06/02/2019 2252   PROTEINUR NEGATIVE 06/03/2019 2252   UROBILINOGEN 1.0 10/20/2017 1303   NITRITE NEGATIVE 06/04/2019 2252   LEUKOCYTESUR NEGATIVE 06/05/2019 2252   Sepsis Labs Invalid input(s): PROCALCITONIN,  WBC,   LACTICIDVEN     SIGNED:  Vianne Bulls, MD  [facility] 2019-06-24, 5:09 AM Pager   If 7PM-7AM, please contact night-coverage [email or web address] Password [email or web address]

## 2019-06-26 NOTE — ED Notes (Signed)
Bairhugger applied to patient. Settings adjusted to 43 degrees celsius

## 2019-06-26 NOTE — H&P (Signed)
History and Physical    Caitlin Wall [id number] DOB: 02-25-1956 DOA: 06/25/2019  PCP: Biagio Borg, MD   Patient coming from: Home   Chief Complaint: Abdominal pain, fatigue   HPI: Caitlin Wall is a 64 y.o. female with medical history significant for asthma, hypertension, chronic kidney disease stage IIIa, and breast cancer metastatic to bone and liver, now presenting to the emergency department with several days to weeks of abdominal pain with nausea and fatigue.  She is accompanied by her husband who assists with the history.  She had undergone palliative radiation and now being treated with denosumab/Xgeva and capecitabine.  She has had constant pain, mainly in her abdomen lately but also bony pain, nausea, vomiting, loss of appetite, and has not been out of bed in a few days.  Patient is somnolent, confused, and unable to contribute much to the history.  EMS was called for transport to the hospital and the patient was hypotensive.  ED Course: Upon arrival to the ED, patient is found to be hypothermic, hypotensive, and tachypneic.  She is found to have acute kidney injury superimposed on chronic kidney disease stage IIIa with severe hyperkalemia, acute and marked worsening in her LFTs with AST now 2575 and total bilirubin 7.4.  Thrombocytopenia has acutely worsened with platelets now 30,000.  Lactic acid is elevated beyond the quantifiable limits of our laboratory.  INR is 2.3 in keeping with acute liver failure.  Blood and urine cultures were collected, the patient was fluid resuscitated, lactic acid remained critically elevated to >11, blood pressure has worsened after initially improving some, and the patient has developed respiratory distress.  Critical care was consulted by the ED physician but after further discussion with the patient's husband, he believes that she would want [place] to just focus on keeping her comfortable at this point, understanding that she may pass at any  moment now.   Review of Systems:  All other systems reviewed and apart from HPI, are negative.  Past Medical History:  Diagnosis Date  . Acute asthma 07/27/2012  . Breast cancer ([place]) 12/28/14   Left BresastDCIS  . COLONIC POLYPS, HX OF 11/04/2007  . DVT (deep venous thrombosis) ([place]) 2018   left leg  . GERD 11/04/2007  . GERD (gastroesophageal reflux disease)   . HAIR LOSS 11/04/2007  . Hearing loss    bilateral - wears hearing aids  . Hx of cardiac cath 11/15/2016   a. LHC 11/15/16 showed normal coronaries and EF 50-55% with normal EDP. (done for false positive NST)  . Hx of colonic polyps 1995   adenomatous  . Hyperlipidemia   . HYPERLIPIDEMIA 11/04/2007  . Hypertension   . HYPERTENSION 06/07/2009  . LBBB (left bundle branch block) 07/26/2011   no current problems  . Personal history of radiation therapy   . S/P radiation therapy 02/02/15-03/24/15   left breast 60.4GY    Past Surgical History:  Procedure Laterality Date  . ABDOMINAL HYSTERECTOMY     partial  . ANTERIOR FUSION CERVICAL SPINE    . bilat ear surgury/hearing loss     wears hearing aids  . BREAST LUMPECTOMY Left 2016  . BREAST LUMPECTOMY WITH RADIOACTIVE SEED LOCALIZATION Left 12/28/2014   Procedure: BREAST LUMPECTOMY WITH RADIOACTIVE SEED LOCALIZATION;  Surgeon: Autumn Messing III, MD;  Location: [facility];  Service: General;  Laterality: Left;  . COLONOSCOPY    . LEFT HEART CATH AND CORONARY ANGIOGRAPHY N/A 11/15/2016   Procedure: LEFT HEART CATH AND  CORONARY ANGIOGRAPHY;  Surgeon: Belva Crome, MD;  Location: [facility];  Service: Cardiovascular;  Laterality: N/A;  . POLYPECTOMY    . UPPER GASTROINTESTINAL ENDOSCOPY    . VULVECTOMY PARTIAL N/A 02/02/2019   Procedure: VULVECTOMY PARTIAL;  Surgeon: Newton Pigg, MD;  Location: [facility];  Service: Gynecology;  Laterality: N/A;  . WISDOM TOOTH EXTRACTION       reports that she quit smoking about 29 years ago. Her smoking use  included cigarettes. She has a 20.00 pack-year smoking history. She has never used smokeless tobacco. She reports current alcohol use. She reports that she does not use drugs.  Allergies  Allergen Reactions  . Codeine     REACTION: nausea,  . Tramadol Hcl Nausea Only    Family History  Problem Relation Age of Onset  . Breast cancer Other   . Cancer Paternal Grandmother        breast  . Lung cancer Father   . Breast cancer Maternal Grandmother   . Healthy Mother   . Coronary artery disease Neg Hx   . Colon cancer Neg Hx   . Pancreatic cancer Neg Hx   . Rectal cancer Neg Hx   . Stomach cancer Neg Hx   . Esophageal cancer Neg Hx      Prior to Admission medications   Medication Sig Start Date End Date Taking? Authorizing Provider  budesonide-formoterol (SYMBICORT) 160-4.5 MCG/ACT inhaler Inhale 2 puffs into the lungs 2 (two) times daily. 12/01/18  Yes Biagio Borg, MD  capecitabine (XELODA) 500 MG tablet Take 3 tablets (1,500 mg total) by mouth 2 (two) times daily after a meal. Take for 14 days, then do not take for 7 days, then repeat. 05/03/19  Yes Magrinat, Virgie Dad, MD  dexamethasone (DECADRON) 2 MG tablet Take 2 tablets (4 mg total) by mouth in the morning. 05/21/19  Yes Magrinat, Virgie Dad, MD  fluconazole (DIFLUCAN) 100 MG tablet Take 1 tablet (100 mg total) by mouth daily. 06/01/19  Yes Magrinat, Virgie Dad, MD  morphine 10 MG/5ML solution Take 2.5-5 mLs (5-10 mg total) by mouth every 4 (four) hours as needed for severe pain. 06/01/19  Yes Magrinat, Virgie Dad, MD  ondansetron (ZOFRAN) 4 MG tablet Take 1 tablet (4 mg total) by mouth 3 (three) times daily before meals. 06/01/19  Yes Magrinat, Virgie Dad, MD  DULoxetine (CYMBALTA) 30 MG capsule Take 1 capsule (30 mg total) by mouth daily. Patient not taking: Reported on 05/13/2019 05/12/19   Gardenia Phlegm, NP  loratadine (CLARITIN) 10 MG tablet Take 1 tablet (10 mg total) by mouth daily. Patient not taking: Reported on Jun 09, 2019  05/21/19   Magrinat, Virgie Dad, MD  morphine (MS CONTIN) 15 MG 12 hr tablet Take 1 tablet (15 mg total) by mouth every 8 (eight) hours. Patient taking differently: Take 15 mg by mouth every 12 (twelve) hours.  06/01/19   Magrinat, Virgie Dad, MD  promethazine (PHENERGAN) 25 MG suppository Place 1 suppository (25 mg total) rectally every 6 (six) hours as needed for nausea or vomiting. 06/01/19   Magrinat, Virgie Dad, MD    Physical Exam: Vitals:   2019/06/09 0400 Jun 09, 2019 0404 06-09-2019 0415 06-09-2019 0430  BP: (!) 58/35 (!) 48/36 (!) 60/32 (!) 63/35  Pulse:  77 76   Resp:  (!) 26    Temp: (!) 95.5 F (35.3 C) (!) 95.5 F (35.3 C) (!) 95.7 F (35.4 C) (!) 95.9 F (35.5 C)  TempSrc:  Bladder Bladder   SpO2:  92% 94%   Weight:      Height:         Constitutional: Tachypneic, pale, restless, confused   Eyes: PERTLA, lids and conjunctivae normal ENMT: Mucous membranes are dry. Posterior pharynx clear of any exudate or lesions.   Neck: normal, supple, no masses, no thyromegaly Respiratory: Tachypneic, using accessory muscles, pale.   Cardiovascular: S1 & S2 heard, regular rate and rhythm. No extremity edema. Abdomen: No distension, soft. Bowel sounds active.  Musculoskeletal: no clubbing / cyanosis. No joint deformity upper and lower extremities.   Skin: no significant rashes, lesions, ulcers. Cool, mottled. Neurologic: No gross facial asymmetry. Sensation intact. Moving all extremities.  Psychiatric: Restless. Confused.     Labs and Imaging on Admission: I have personally reviewed following labs and imaging studies  CBC: Recent Labs  Lab 05/31/19 0855 06/03/2019 2252  WBC 10.1 6.4  NEUTROABS 8.1* 5.1  HGB 13.5 13.8  HCT 42.1 45.5  MCV 101.4* 109.6*  PLT 94* 30*   Basic Metabolic Panel: Recent Labs  Lab 05/31/19 0855 06/22/2019 2252  NA 138 132*  K 4.9 >7.5*  CL 103 97*  CO2 18* 7*  GLUCOSE 100* 101*  BUN 32* 77*  CREATININE 1.05* 2.36*  CALCIUM 8.6* 8.2*   GFR: Estimated  Creatinine Clearance: 24.3 mL/min (A) (by C-G formula based on SCr of 2.36 mg/dL (H)). Liver Function Tests: Recent Labs  Lab 05/31/19 0855 05/28/2019 2252  AST 520* 2,575*  ALT 144* 508*  ALKPHOS 679* 768*  BILITOT 3.1* 7.4*  PROT 6.3* 5.1*  ALBUMIN 2.1* 1.9*   No results for input(s): LIPASE, AMYLASE in the last 168 hours. No results for input(s): AMMONIA in the last 168 hours. Coagulation Profile: Recent Labs  Lab 05/27/2019 2252  INR 2.3*   Cardiac Enzymes: No results for input(s): CKTOTAL, CKMB, CKMBINDEX, TROPONINI in the last 168 hours. BNP (last 3 results) No results for input(s): PROBNP in the last 8760 hours. HbA1C: No results for input(s): HGBA1C in the last 72 hours. CBG: No results for input(s): GLUCAP in the last 168 hours. Lipid Profile: No results for input(s): CHOL, HDL, LDLCALC, TRIG, CHOLHDL, LDLDIRECT in the last 72 hours. Thyroid Function Tests: No results for input(s): TSH, T4TOTAL, FREET4, T3FREE, THYROIDAB in the last 72 hours. Anemia Panel: No results for input(s): VITAMINB12, FOLATE, FERRITIN, TIBC, IRON, RETICCTPCT in the last 72 hours. Urine analysis:    Component Value Date/Time   COLORURINE AMBER (A) 05/31/2019 2252   APPEARANCEUR CLEAR 06/25/2019 2252   LABSPEC 1.019 06/19/2019 2252   PHURINE 5.0 06/24/2019 2252   GLUCOSEU NEGATIVE 06/12/2019 2252   GLUCOSEU NEGATIVE 10/20/2017 1303   HGBUR MODERATE (A) 06/01/2019 2252   BILIRUBINUR NEGATIVE 06/04/2019 2252   KETONESUR NEGATIVE 05/30/2019 2252   PROTEINUR NEGATIVE 05/31/2019 2252   UROBILINOGEN 1.0 10/20/2017 1303   NITRITE NEGATIVE 05/29/2019 2252   LEUKOCYTESUR NEGATIVE 05/30/2019 2252   Sepsis Labs: @LABRCNTIP (procalcitonin:4,lacticidven:4) ) Recent Results (from the past 240 hour(s))  Culture, blood (Routine x 2)     Status: None (Preliminary result)   Collection Time: 06/14/2019 10:52 PM   Specimen: BLOOD  Result Value Ref Range Status   Specimen Description BLOOD LEFT  ANTECUBITAL  Final   Special Requests   Final    BOTTLES DRAWN AEROBIC AND ANAEROBIC Blood Culture results may not be optimal due to an excessive volume of blood received in culture bottles Performed at [facility], [address] [place],  [place] [address]    Culture PENDING  Incomplete   Report Status PENDING  Incomplete  Culture, blood (Routine x 2)     Status: None (Preliminary result)   Collection Time: 05/29/2019 10:57 PM   Specimen: BLOOD  Result Value Ref Range Status   Specimen Description BLOOD RIGHT ANTECUBITAL  Final   Special Requests   Final    BOTTLES DRAWN AEROBIC AND ANAEROBIC Blood Culture adequate volume Performed at [facility] [address] [place], [place] [address]    Culture PENDING  Incomplete   Report Status PENDING  Incomplete  Respiratory Panel by RT PCR (Flu A&B, Covid) - Nasopharyngeal Swab     Status: None   Collection Time: 2019/06/16  1:05 AM   Specimen: Nasopharyngeal Swab  Result Value Ref Range Status   SARS Coronavirus 2 by RT PCR NEGATIVE NEGATIVE Final    Comment: (NOTE) SARS-CoV-2 target nucleic acids are NOT DETECTED. The SARS-CoV-2 RNA is generally detectable in upper respiratoy specimens during the acute phase of infection. The lowest concentration of SARS-CoV-2 viral copies this assay can detect is 131 copies/mL. A negative result does not preclude SARS-Cov-2 infection and should not be used as the sole basis for treatment or other patient management decisions. A negative result may occur with  improper specimen collection/handling, submission of specimen other than nasopharyngeal swab, presence of viral mutation(s) within the areas targeted by this assay, and inadequate number of viral copies (<131 copies/mL). A negative result must be combined with clinical observations, patient history, and epidemiological information. The expected result is Negative. Fact Sheet for Patients:    PinkCheek.be Fact Sheet for Healthcare Providers:  GravelBags.it This test is not yet ap proved or cleared by the [place] FDA and  has been authorized for detection and/or diagnosis of SARS-CoV-2 by FDA under an Emergency Use Authorization (EUA). This EUA will remain  in effect (meaning this test can be used) for the duration of the COVID-19 declaration under Section 564(b)(1) of the Act, 21 U.S.C. section 360bbb-3(b)(1), unless the authorization is terminated or revoked sooner.    Influenza A by PCR NEGATIVE NEGATIVE Final   Influenza B by PCR NEGATIVE NEGATIVE Final    Comment: (NOTE) The Xpert Xpress SARS-CoV-2/FLU/RSV assay is intended as an aid in  the diagnosis of influenza from Nasopharyngeal swab specimens and  should not be used as a sole basis for treatment. Nasal washings and  aspirates are unacceptable for Xpert Xpress SARS-CoV-2/FLU/RSV  testing. Fact Sheet for Patients: PinkCheek.be Fact Sheet for Healthcare Providers: GravelBags.it This test is not yet approved or cleared by the [place] FDA and  has been authorized for detection and/or diagnosis of SARS-CoV-2 by  FDA under an Emergency Use Authorization (EUA). This EUA will remain  in effect (meaning this test can be used) for the duration of the  Covid-19 declaration under Section 564(b)(1) of the Act, 21  U.S.C. section 360bbb-3(b)(1), unless the authorization is  terminated or revoked. Performed at [facility], [place] [address] [place], [place] [address]      Radiological Exams on Admission: DG Chest Port 1 View  Result Date: 05/31/2019 CLINICAL DATA:  History of breast cancer with abdominal pain. EXAM: PORTABLE CHEST 1 VIEW COMPARISON:  April 23, 2019 FINDINGS: Mild, predominant stable bilateral infrahilar scarring, atelectasis and/or infiltrate is seen. There is  no evidence of a pleural effusion or pneumothorax. The heart size and mediastinal contours are within normal limits. Multilevel degenerative changes seen throughout the  thoracic spine. IMPRESSION: Mild, predominant stable bilateral infrahilar scarring, atelectasis and/or infiltrate. Electronically Signed   By: Virgina Norfolk M.D.   On: 06/25/2019 23:43    EKG: Independently reviewed. Sinus rhythm, non-specific IVCD.   Assessment/Plan   1. Shock   - Presents with hypothermia, hypotension, and confusion and is found to have AKI with severe hyperkalemia, acute liver failure, lactate >11, and is developing acute respiratory failure  - Suspected to be septic  - She was cultured, fluid-resuscitated, and started on broad spectrum antibiotics but continues to worsen  - Husband is at the bedside and believes that Mrs. Chura would want [place] to focus on keeping her comfortable only, understanding that this may hasten her passing    2. Hyperkalemia; acute kidney injury superimposed on CKD IIIa - SCr is up from baseline with potassium >7.5  - She was widened QRS on EKG  - She was treated in ED with Lokelma, bicarb, insulin/dextrose, and IV calcium before transitioning to comfort measures only    3. Acute liver failure  - LFTs up acutely and INR now 2.3   4. Breast cancer  - She has breast cancer with bone and liver metastases s/p palliative radiation and has been taking chemotherapy    5. Acute respiratory failure with hypoxia  - No acute findings on CXR but has become tachypneic and hypoxic in ED  - She was started on supplemental O2, now transitioning to comfort measures only    DVT prophylaxis: None   Code Status: DNR, confirmed with husband at bedside that patient would want comfort measures only at this point  Family Communication: Husband updated at bedside Disposition Plan: Anticipate death in hospital  Consults called: None  Admission status: Observation    Vianne Bulls,  MD [facility] Pager: See [email or web address]  If 7AM-7PM, please contact the daytime attending [email or web address]  Jul 05, 2019, 4:53 AM

## 2019-06-26 NOTE — ED Notes (Signed)
Date and time results received: 07-02-2019 2355 (use smartphrase ".now" to insert current time)  Test: Lactic Acid; Potassium Critical Value: >11; >7.5  Name of Provider Notified: Dr.I. Tomi Bamberger  Orders Received? Or Actions Taken?:

## 2019-06-26 DEATH — deceased

## 2019-06-28 ENCOUNTER — Other Ambulatory Visit: Payer: 59

## 2019-06-28 ENCOUNTER — Ambulatory Visit: Payer: 59

## 2019-06-29 ENCOUNTER — Ambulatory Visit: Payer: 59 | Admitting: Internal Medicine

## 2019-07-08 NOTE — Progress Notes (Signed)
  Radiation Oncology         ([phone] ________________________________  Name: Caitlin Wall MRN: [id number]  Date: 06/01/2019  DOB: [birth year]  End of Treatment Note  Diagnosis:   Stage IV breast cancer     Indication for treatment::  palliative       Radiation treatment dates:   05/19/2019 through 06/01/2019  Site/dose:   The lumbosacral spine was treated to a dose of 30 Gray in 10 fractions at 3 Gy per fraction using a 2 field technique  Narrative: The patient tolerated radiation treatment relatively well.     Plan: The patient has completed radiation treatment. The patient will return to radiation oncology clinic for routine followup in one month. I advised the patient to call or return sooner if they have any questions or concerns related to their recovery or treatment. ________________________________  Jodelle Gross, M.D., Ph.D.

## 2019-07-12 ENCOUNTER — Ambulatory Visit: Payer: 59 | Admitting: Internal Medicine

## 2019-10-14 ENCOUNTER — Encounter: Payer: Self-pay | Admitting: Oncology

## 2020-03-19 IMAGING — NM NM BONE WHOLE BODY
2 series · 2 of 2 positions shown · non-contrast
Comparison: None

Correlation CT chest 02/06/2018

CLINICAL DATA: Invasive breast cancer restaging, sternal fracture
question question pathologic

EXAM:
NUCLEAR MEDICINE WHOLE BODY BONE SCAN
TECHNIQUE: Whole body anterior and posterior images were obtained approximately
3 hours after intravenous injection of radiopharmaceutical.
RADIOPHARMACEUTICALS:  20.2 mCi Uechnetium-EEm MDP IV

[Series 1: whole body · 2.66mm/px · 1 of 1 slices shown (1 of 2)]
[im 1/1]
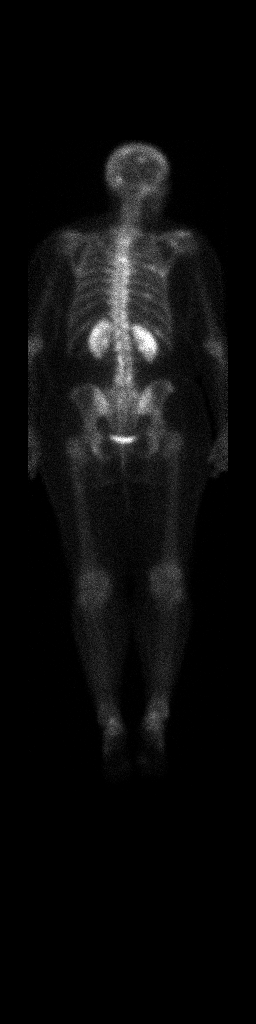

[Series 1: whole body · 2.66mm/px · 1 of 1 slices shown (2 of 2)]
[im 1/1]
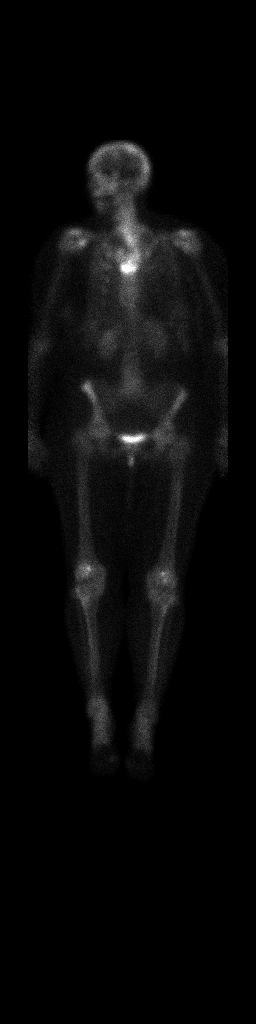

[2 of 2 positions shown; findings below may reference images not displayed]

FINDINGS: Multiple sites of abnormal osseous tracer accumulation are
identified suspicious for osseous metastatic disease.

These include calvarium, thoracic spine, and multiple RIGHT ribs and
questionably RIGHT iliac crest laterally.

Increased tracer accumulation is also seen at the sternal fracture
seen previously, suspicious for underlying pathologic lesion by
prior CT, unable to differentiate by scintigraphy.

Uptake at the shoulders and knees, typically degenerative.

No additional sites of abnormal osseous tracer accumulation are
identified.

Expected urinary tract and soft tissue distribution of tracer.
IMPRESSION: Abnormal tracer localization at multiple foci in the calvarium,
RIGHT ribs, thoracic spine, and questionably RIGHT iliac bone
consistent with osseous metastatic disease.

Intense focal uptake at the sternal fracture identified by prior CT;
unable to differentiate traumatic versus pathologic fractures by
scintigraphy but lesion appeared suspicious for underlying
metastatic disease by CT.

## 2021-06-18 IMAGING — US US BIOPSY CORE LIVER
1 series · 13 of 13 positions shown · non-contrast
Comparison: none

INDICATION: 64-year-old female with breast cancer and multifocal hepatic lesions
concerning for metastatic disease. She presents for
ultrasound-guided core biopsy of the same.

[Series 1: us biopsy core liver · 13 of 13 slices shown]
[im 1/13]
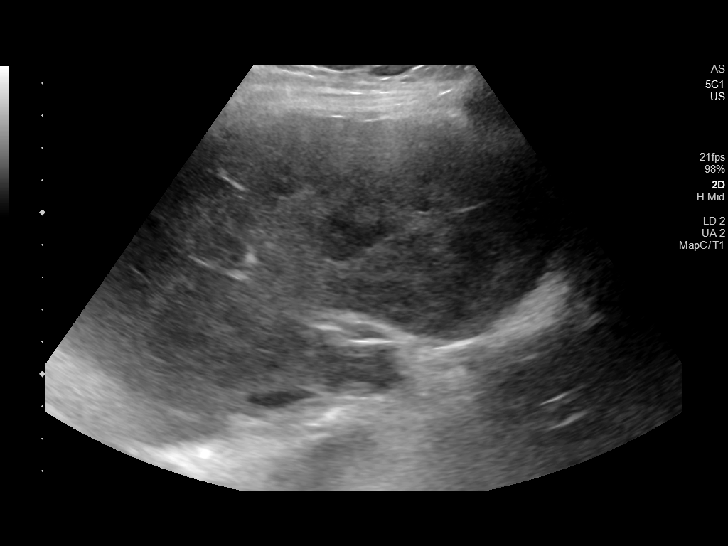
[im 2/13]
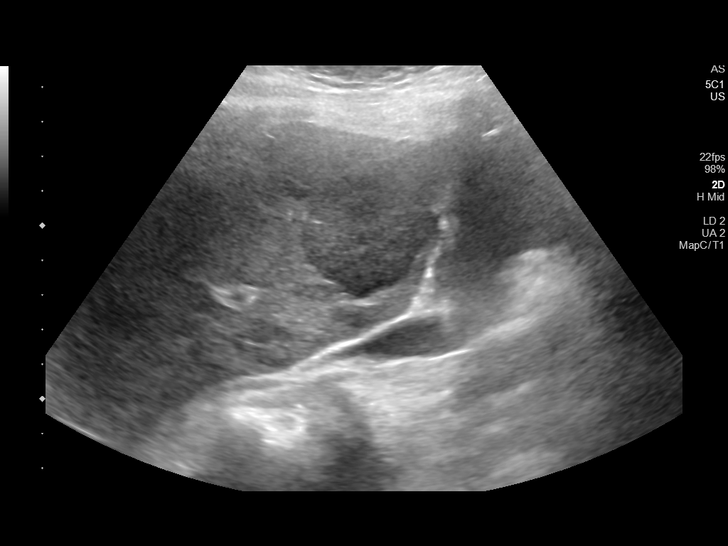
[im 3/13]
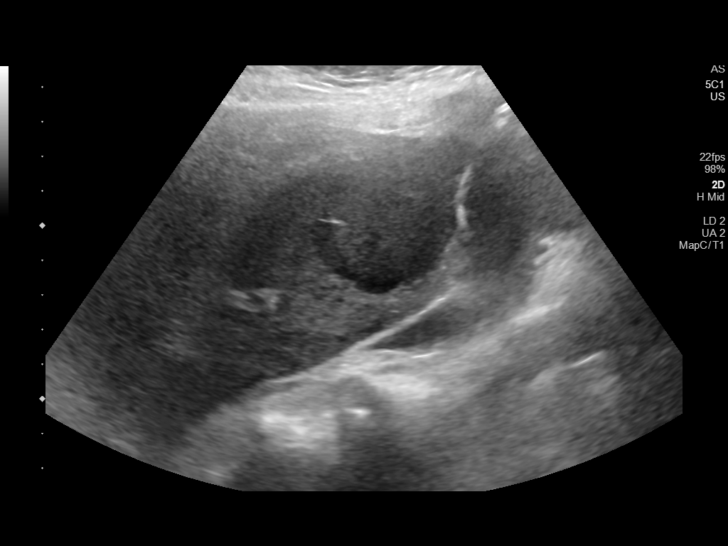
[im 4/13]
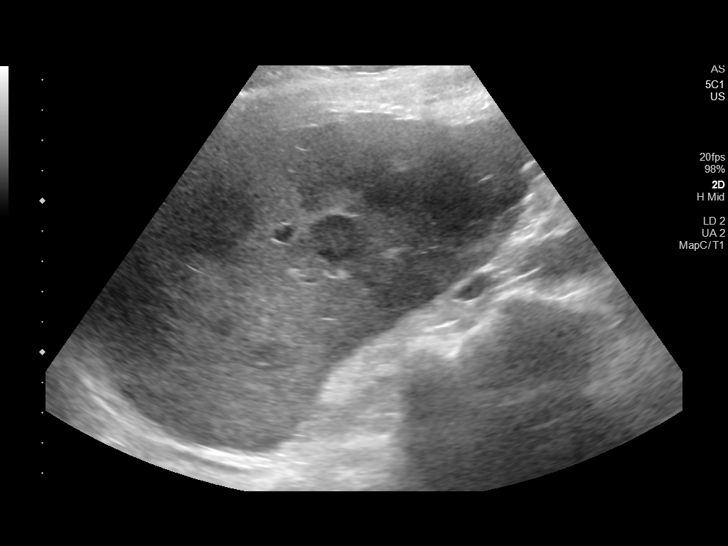
[im 5/13]
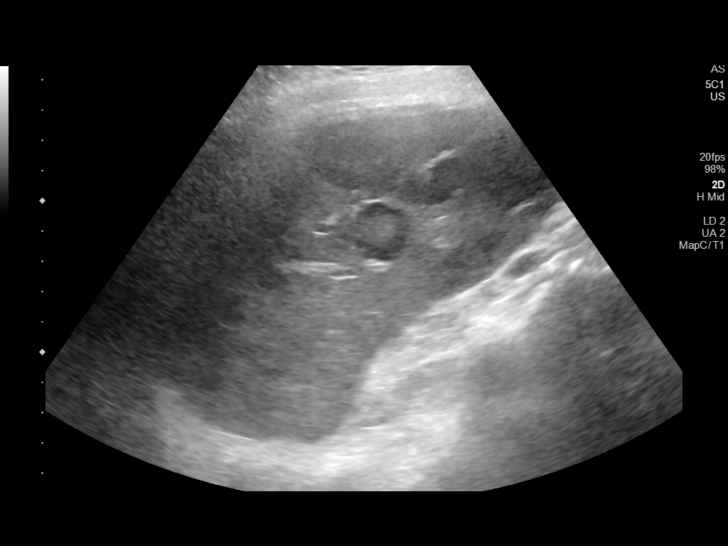
[im 6/13]
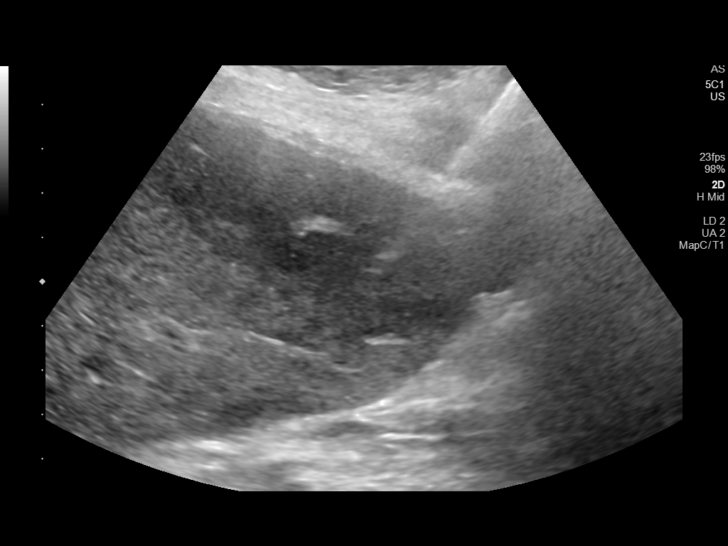
[im 7/13]
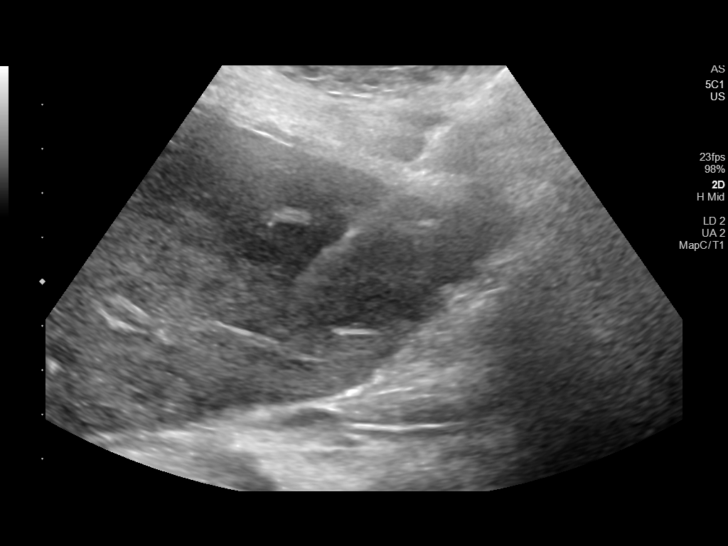
[im 8/13]
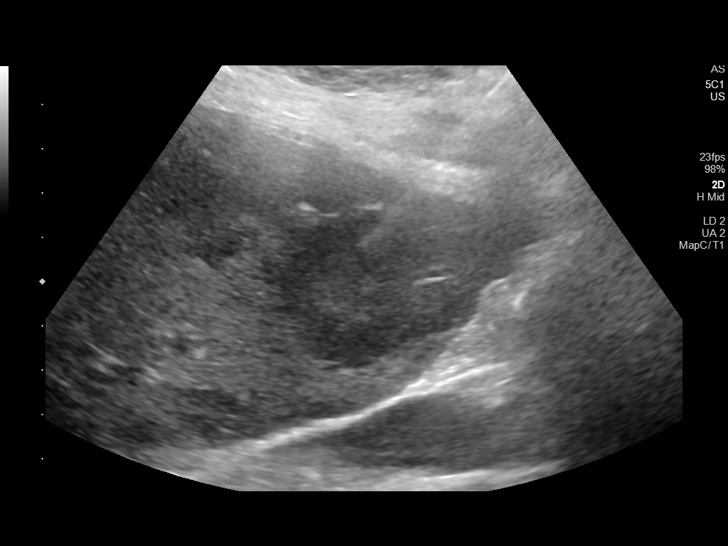
[im 9/13]
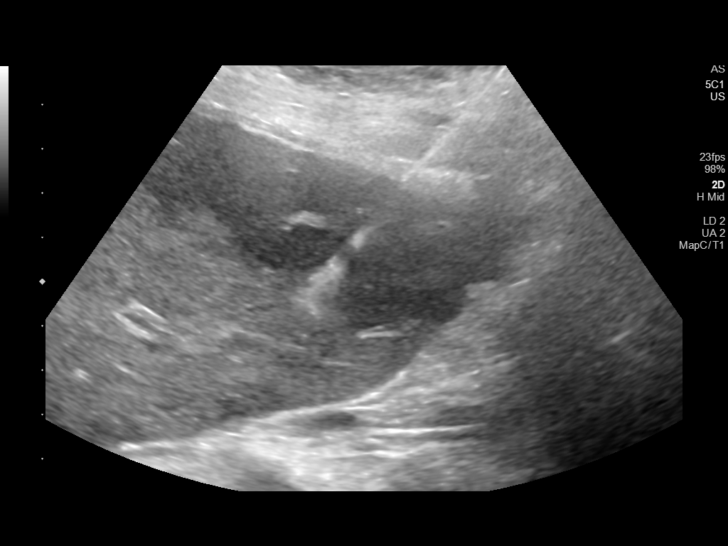
[im 10/13]
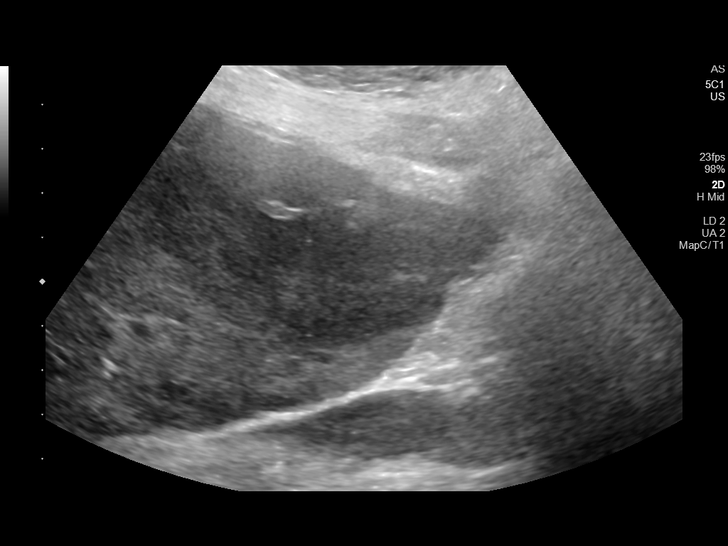
[im 11/13]
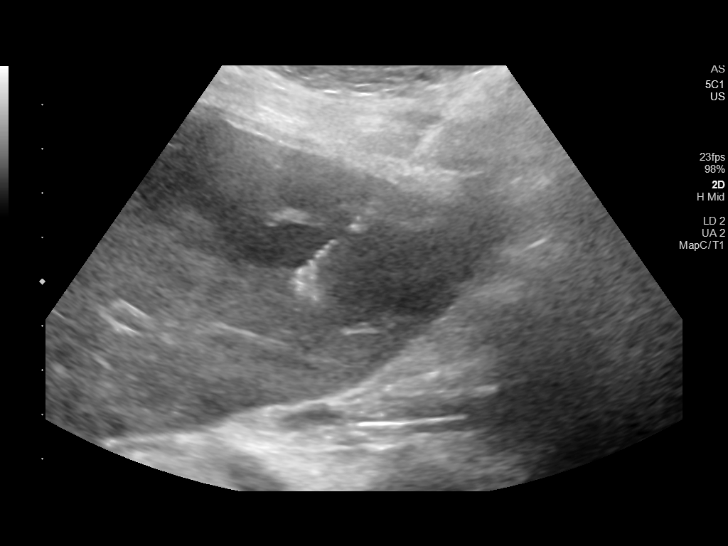
[im 12/13]
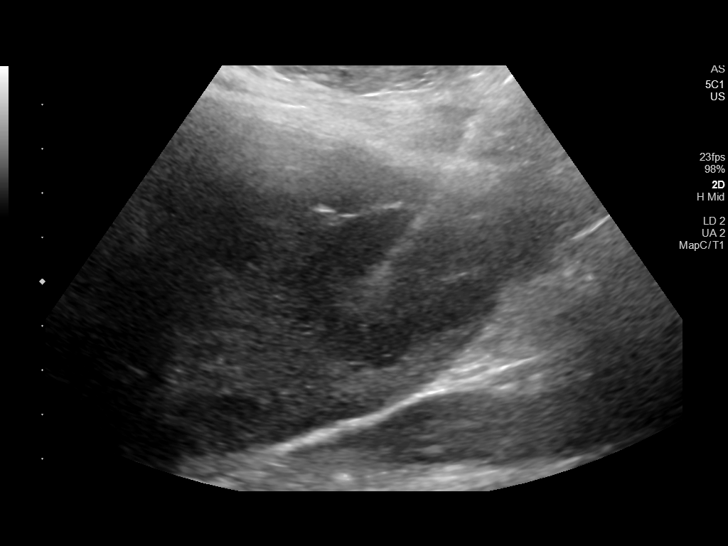
[im 13/13]
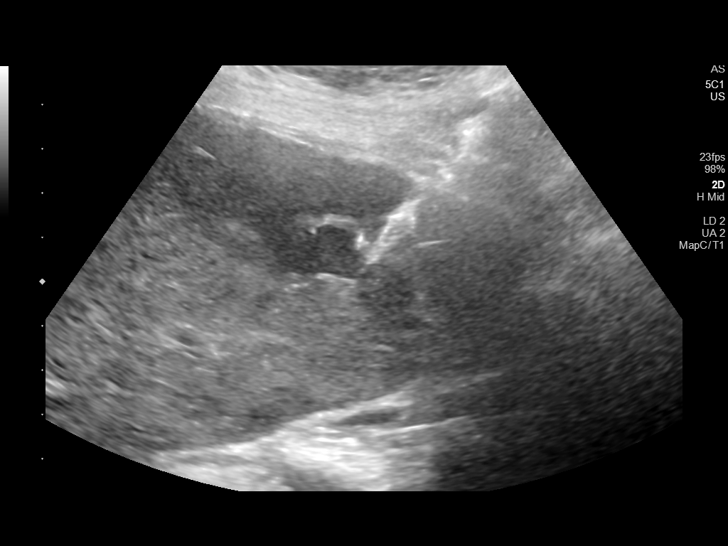

[13 of 13 positions shown; findings below may reference images not displayed]

EXAM:
ULTRASOUND BIOPSY CORE LIVER

MEDICATIONS:
None.

ANESTHESIA/SEDATION:
Moderate (conscious) sedation was employed during this procedure. A
total of Versed 2 mg and Fentanyl 100 mcg was administered
intravenously.

Moderate Sedation Time: 14 minutes. The patient's level of
consciousness and vital signs were monitored continuously by
radiology nursing throughout the procedure under my direct
supervision.

FLUOROSCOPY TIME:  None.

COMPLICATIONS:
None immediate.

PROCEDURE:
Informed written consent was obtained from the patient after a
thorough discussion of the procedural risks, benefits and
alternatives. All questions were addressed. Maximal Sterile Barrier
Technique was utilized including caps, mask, sterile gowns, sterile
gloves, sterile drape, hand hygiene and skin antiseptic. A timeout
was performed prior to the initiation of the procedure.

Ultrasound was used to interrogate the liver. Multiple hypoechoic
solid liver lesions are identified. A suitable lesion in hepatic
segment 5 was selected. A skin entry site was marked. The overlying
skin was sterilely prepped and draped in standard fashion using
chlorhexidine skin prep. Local anesthesia was attained by
infiltration with 1% lidocaine. A small dermatotomy was made. Under
real-time ultrasound guidance, a 17 gauge introducer needle was
advanced through the liver and positioned at the margin of the mass.
Multiple 18 gauge core biopsies were then coaxially obtained using
the [employer] automated biopsy device. Biopsy specimens were placed
in formalin and delivered to pathology for further analysis.

As the introducer needle was removed, the biopsy tract was embolized
with a Gel-Foam slurry. Post biopsy ultrasound imaging demonstrates
no evidence of immediate complication.
IMPRESSION: Technically successful ultrasound-guided core biopsy of liver
lesion.
